# Patient Record
Sex: Female | Born: 1945
Health system: Southern US, Community
[De-identification: ages and names within clinical notes are randomized; demographics above are authoritative.]

## PROBLEM LIST (undated history)

## (undated) DIAGNOSIS — R0602 Shortness of breath: Secondary | ICD-10-CM

## (undated) DIAGNOSIS — J449 Chronic obstructive pulmonary disease, unspecified: Secondary | ICD-10-CM

## (undated) DIAGNOSIS — C801 Malignant (primary) neoplasm, unspecified: Secondary | ICD-10-CM

## (undated) DIAGNOSIS — J45909 Unspecified asthma, uncomplicated: Secondary | ICD-10-CM

## (undated) DIAGNOSIS — K567 Ileus, unspecified: Secondary | ICD-10-CM

## (undated) DIAGNOSIS — K9189 Other postprocedural complications and disorders of digestive system: Secondary | ICD-10-CM

## (undated) DIAGNOSIS — J90 Pleural effusion, not elsewhere classified: Secondary | ICD-10-CM

## (undated) DIAGNOSIS — C3490 Malignant neoplasm of unspecified part of unspecified bronchus or lung: Secondary | ICD-10-CM

## (undated) HISTORY — PX: LUNG BIOPSY: SHX232

## (undated) HISTORY — PX: APPENDECTOMY: SHX54

## (undated) HISTORY — PX: BREAST SURGERY: SHX581

## (undated) HISTORY — PX: ABDOMINAL HYSTERECTOMY: SHX81

## (undated) HISTORY — PX: CATARACT EXTRACTION W/ INTRAOCULAR LENS IMPLANT: SHX1309

## (undated) HISTORY — PX: COLON SURGERY: SHX602

## (undated) HISTORY — PX: EYE SURGERY: SHX253

## (undated) HISTORY — PX: DILATION AND CURETTAGE OF UTERUS: SHX78

---

## 2006-10-11 ENCOUNTER — Ambulatory Visit: Payer: Self-pay | Admitting: Cardiology

## 2009-12-30 ENCOUNTER — Ambulatory Visit (HOSPITAL_COMMUNITY): Admission: RE | Admit: 2009-12-30 | Discharge: 2009-12-30 | Payer: Self-pay | Admitting: Ophthalmology

## 2011-02-16 LAB — BASIC METABOLIC PANEL
BUN: 7 mg/dL (ref 6–23)
CO2: 31 mEq/L (ref 19–32)
Calcium: 9.2 mg/dL (ref 8.4–10.5)
Chloride: 104 mEq/L (ref 96–112)
Creatinine, Ser: 0.64 mg/dL (ref 0.4–1.2)
GFR calc Af Amer: >60 mL/min (ref >60)
GFR calc non Af Amer: >60 mL/min (ref >60)
Glucose, Bld: 93 mg/dL (ref 70–99)
Potassium: 4.1 mEq/L (ref 3.5–5.1)
Sodium: 140 mEq/L (ref 135–145)

## 2011-02-16 LAB — CBC
HCT: 46.6 % — ABNORMAL HIGH (ref 36.0–46.0)
Hemoglobin: 15.9 g/dL — ABNORMAL HIGH (ref 12.0–15.0)
MCV: 97.2 fL (ref 78.0–100.0)
Platelets: 165 10*3/uL (ref 150–400)
RBC: 4.8 MIL/uL (ref 3.87–5.11)
WBC: 6.1 10*3/uL (ref 4.0–10.5)

## 2012-10-10 DIAGNOSIS — Z961 Presence of intraocular lens: Secondary | ICD-10-CM | POA: Diagnosis not present

## 2012-10-10 DIAGNOSIS — H2510 Age-related nuclear cataract, unspecified eye: Secondary | ICD-10-CM | POA: Diagnosis not present

## 2012-10-10 DIAGNOSIS — H52229 Regular astigmatism, unspecified eye: Secondary | ICD-10-CM | POA: Diagnosis not present

## 2012-10-10 DIAGNOSIS — H524 Presbyopia: Secondary | ICD-10-CM | POA: Diagnosis not present

## 2012-10-17 DIAGNOSIS — S335XXA Sprain of ligaments of lumbar spine, initial encounter: Secondary | ICD-10-CM | POA: Diagnosis not present

## 2012-10-17 DIAGNOSIS — R300 Dysuria: Secondary | ICD-10-CM | POA: Diagnosis not present

## 2012-12-02 DIAGNOSIS — H547 Unspecified visual loss: Secondary | ICD-10-CM | POA: Diagnosis not present

## 2012-12-02 DIAGNOSIS — Z9849 Cataract extraction status, unspecified eye: Secondary | ICD-10-CM | POA: Diagnosis not present

## 2012-12-02 DIAGNOSIS — H2510 Age-related nuclear cataract, unspecified eye: Secondary | ICD-10-CM | POA: Diagnosis not present

## 2012-12-20 ENCOUNTER — Encounter (HOSPITAL_COMMUNITY): Payer: Self-pay | Admitting: Pharmacy Technician

## 2012-12-25 ENCOUNTER — Encounter (HOSPITAL_COMMUNITY): Payer: Self-pay

## 2012-12-25 ENCOUNTER — Other Ambulatory Visit: Payer: Self-pay

## 2012-12-25 ENCOUNTER — Encounter (HOSPITAL_COMMUNITY)
Admission: RE | Admit: 2012-12-25 | Discharge: 2012-12-25 | Disposition: A | Discharge status: Home / Self Care | Payer: Medicare Other | Source: Ambulatory Visit | Attending: Ophthalmology | Admitting: Ophthalmology

## 2012-12-25 DIAGNOSIS — Z01810 Encounter for preprocedural cardiovascular examination: Secondary | ICD-10-CM | POA: Diagnosis not present

## 2012-12-25 DIAGNOSIS — J449 Chronic obstructive pulmonary disease, unspecified: Secondary | ICD-10-CM | POA: Diagnosis not present

## 2012-12-25 DIAGNOSIS — Z01812 Encounter for preprocedural laboratory examination: Secondary | ICD-10-CM | POA: Diagnosis not present

## 2012-12-25 DIAGNOSIS — H2510 Age-related nuclear cataract, unspecified eye: Secondary | ICD-10-CM | POA: Diagnosis not present

## 2012-12-25 HISTORY — DX: Chronic obstructive pulmonary disease, unspecified: J44.9

## 2012-12-25 HISTORY — DX: Shortness of breath: R06.02

## 2012-12-25 HISTORY — DX: Unspecified asthma, uncomplicated: J45.909

## 2012-12-25 LAB — BASIC METABOLIC PANEL
CO2: 33 mEq/L — ABNORMAL HIGH (ref 19–32)
Chloride: 104 mEq/L (ref 96–112)
GFR calc non Af Amer: >90 mL/min (ref >90)
Glucose, Bld: 83 mg/dL (ref 70–99)
Potassium: 4.9 mEq/L (ref 3.5–5.1)
Sodium: 142 mEq/L (ref 135–145)

## 2012-12-25 NOTE — Patient Instructions (Addendum)
Your procedure is scheduled on:  01/02/13  Report to [facility] at 7:00     AM.  Call this number if you have problems the morning of surgery: [phone]   Remember:   Do not eat or drink :After Midnight.    Take these medicines the morning of surgery:  Use Albuterol and Qvar inhalers   Do not wear jewelry, make-up or nail polish.  Do not wear lotions, powders, or perfumes. You may wear deodorant.  Do not shave 48 hours prior to surgery.  Do not bring valuables to the hospital.  Contacts, dentures or bridgework may not be worn into surgery.  Patients discharged the day of surgery will not be allowed to drive home.  Name and phone number of your driver:    Please read over the following fact sheets that you were given: Pain Booklet, Surgical Site Infection Prevention, Anesthesia Post-op Instructions and Care and Recovery After Surgery  Cataract Surgery  A cataract is a clouding of the lens of the eye. When a lens becomes cloudy, vision is reduced based on the degree and nature of the clouding. Surgery may be needed to improve vision. Surgery removes the cloudy lens and usually replaces it with a substitute lens (intraocular lens, IOL). LET YOUR EYE DOCTOR KNOW ABOUT:  Allergies to food or medicine.   Medicines taken including herbs, eyedrops, over-the-counter medicines, and creams.   Use of steroids (by mouth or creams).   Previous problems with anesthetics or numbing medicine.   History of bleeding problems or blood clots.   Previous surgery.   Other health problems, including diabetes and kidney problems.   Possibility of pregnancy, if this applies.  RISKS AND COMPLICATIONS  Infection.   Inflammation of the eyeball (endophthalmitis) that can spread to both eyes (sympathetic ophthalmia).   Poor wound healing.   If an IOL is inserted, it can later fall out of proper position. This is very uncommon.   Clouding of the part of your eye that holds an IOL in place. This is  called an "after-cataract." These are uncommon, but easily treated.  BEFORE THE PROCEDURE  Do not eat or drink anything except small amounts of water for 8 to 12 before your surgery, or as directed by your caregiver.   Unless you are told otherwise, continue any eyedrops you have been prescribed.   Talk to your primary caregiver about all other medicines that you take (both prescription and non-prescription). In some cases, you may need to stop or change medicines near the time of your surgery. This is most important if you are taking blood-thinning medicine.Do not stop medicines unless you are told to do so.   Arrange for someone to drive you to and from the procedure.   Do not put contact lenses in either eye on the day of your surgery.  PROCEDURE There is more than one method for safely removing a cataract. Your doctor can explain the differences and help determine which is best for you. Phacoemulsification surgery is the most common form of cataract surgery.  An injection is given behind the eye or eyedrops are given to make this a painless procedure.   A small cut (incision) is made on the edge of the clear, dome-shaped surface that covers the front of the eye (cornea).   A tiny probe is painlessly inserted into the eye. This device gives off ultrasound waves that soften and break up the cloudy center of the lens. This makes it easier for  the cloudy lens to be removed by suction.   An IOL may be implanted.   The normal lens of the eye is covered by a clear capsule. Part of that capsule is intentionally left in the eye to support the IOL.   Your surgeon may or may not use stitches to close the incision.  There are other forms of cataract surgery that require a larger incision and stiches to close the eye. This approach is taken in cases where the doctor feels that the cataract cannot be easily removed using phacoemulsification. AFTER THE PROCEDURE  When an IOL is implanted, it  does not need care. It becomes a permanent part of your eye and cannot be seen or felt.   Your doctor will schedule follow-up exams to check on your progress.   Review your other medicines with your doctor to see which can be resumed after surgery.   Use eyedrops or take medicine as prescribed by your doctor.  Document Released: 11/02/2011 Document Reviewed: 10/30/2011 [facility] Patient Information 2012 [place].  .Cataract Surgery Care After Refer to this sheet in the next few weeks. These instructions provide you with information on caring for yourself after your procedure. Your caregiver may also give you more specific instructions. Your treatment has been planned according to current medical practices, but problems sometimes occur. Call your caregiver if you have any problems or questions after your procedure.  HOME CARE INSTRUCTIONS   Avoid strenuous activities as directed by your caregiver.   Ask your caregiver when you can resume driving.   Use eyedrops or other medicines to help healing and control pressure inside your eye as directed by your caregiver.   Only take over-the-counter or prescription medicines for pain, discomfort, or fever as directed by your caregiver.   Do not to touch or rub your eyes.   You may be instructed to use a protective shield during the first few days and nights after surgery. If not, wear sunglasses to protect your eyes. This is to protect the eye from pressure or from being accidentally bumped.   Keep the area around your eye clean and dry. Avoid swimming or allowing water to hit you directly in the face while showering. Keep soap and shampoo out of your eyes.   Do not bend or lift heavy objects. Bending increases pressure in the eye. You can walk, climb stairs, and do light household chores.   Do not put a contact lens into the eye that had surgery until your caregiver says it is okay to do so.   Ask your doctor when you can return to  work. This will depend on the kind of work that you do. If you work in a dusty environment, you may be advised to wear protective eyewear for a period of time.   Ask your caregiver when it will be safe to engage in sexual activity.   Continue with your regular eye exams as directed by your caregiver.  What to expect:  It is normal to feel itching and mild discomfort for a few days after cataract surgery. Some fluid discharge is also common, and your eye may be sensitive to light and touch.   After 1 to 2 days, even moderate discomfort should disappear. In most cases, healing will take about 6 weeks.   If you received an intraocular lens (IOL), you may notice that colors are very bright or have a blue tinge. Also, if you have been in bright sunlight, everything may appear  reddish for a few hours. If you see these color tinges, it is because your lens is clear and no longer cloudy. Within a few months after receiving an IOL, these extra colors should go away. When you have healed, you will probably need new glasses.  SEEK MEDICAL CARE IF:   You have increased bruising around your eye.   You have discomfort not helped by medicine.  SEEK IMMEDIATE MEDICAL CARE IF:   You have a fever.   You have a worsening or sudden vision loss.   You have redness, swelling, or increasing pain in the eye.   You have a thick discharge from the eye that had surgery.  MAKE SURE YOU:  Understand these instructions.   Will watch your condition.   Will get help right away if you are not doing well or get worse.  Document Released: 06/02/2005 Document Revised: 11/02/2011 Document Reviewed: 07/07/2011 [facility] Patient Information 2012 [place], [place].

## 2013-01-02 ENCOUNTER — Encounter (HOSPITAL_COMMUNITY)
Admission: RE | Disposition: A | Discharge status: Home / Self Care | Payer: Self-pay | Source: Ambulatory Visit | Attending: Ophthalmology

## 2013-01-02 ENCOUNTER — Ambulatory Visit (HOSPITAL_COMMUNITY): Payer: Medicare Other | Admitting: Anesthesiology

## 2013-01-02 ENCOUNTER — Encounter (HOSPITAL_COMMUNITY): Payer: Self-pay | Admitting: *Deleted

## 2013-01-02 ENCOUNTER — Ambulatory Visit (HOSPITAL_COMMUNITY)
Admission: RE | Admit: 2013-01-02 | Discharge: 2013-01-02 | Disposition: A | Discharge status: Home / Self Care | Payer: Medicare Other | Source: Ambulatory Visit | Attending: Ophthalmology | Admitting: Ophthalmology

## 2013-01-02 ENCOUNTER — Encounter (HOSPITAL_COMMUNITY): Payer: Self-pay | Admitting: Anesthesiology

## 2013-01-02 DIAGNOSIS — Z01810 Encounter for preprocedural cardiovascular examination: Secondary | ICD-10-CM | POA: Insufficient documentation

## 2013-01-02 DIAGNOSIS — J4489 Other specified chronic obstructive pulmonary disease: Secondary | ICD-10-CM | POA: Insufficient documentation

## 2013-01-02 DIAGNOSIS — J449 Chronic obstructive pulmonary disease, unspecified: Secondary | ICD-10-CM | POA: Insufficient documentation

## 2013-01-02 DIAGNOSIS — H2510 Age-related nuclear cataract, unspecified eye: Secondary | ICD-10-CM | POA: Diagnosis not present

## 2013-01-02 DIAGNOSIS — Z01812 Encounter for preprocedural laboratory examination: Secondary | ICD-10-CM | POA: Diagnosis not present

## 2013-01-02 DIAGNOSIS — H269 Unspecified cataract: Secondary | ICD-10-CM | POA: Diagnosis not present

## 2013-01-02 HISTORY — PX: CATARACT EXTRACTION W/PHACO: SHX586

## 2013-01-02 SURGERY — PHACOEMULSIFICATION, CATARACT, WITH IOL INSERTION
Anesthesia: Monitor Anesthesia Care | Site: Eye | Laterality: Left | Wound class: Clean

## 2013-01-02 MED ORDER — NEOMYCIN-POLYMYXIN-DEXAMETH 0.1 % OP OINT
TOPICAL_OINTMENT | OPHTHALMIC | Status: DC | PRN
  Administered 2013-01-02: 1 via OPHTHALMIC

## 2013-01-02 MED ORDER — MIDAZOLAM HCL 2 MG/2ML IJ SOLN
1.0000 mg | INTRAMUSCULAR | Status: DC | PRN
  Administered 2013-01-02: 2 mg via INTRAVENOUS

## 2013-01-02 MED ORDER — LIDOCAINE HCL 3.5 % OP GEL
OPHTHALMIC | Status: AC
  Filled 2013-01-02: qty 5

## 2013-01-02 MED ORDER — TETRACAINE HCL 0.5 % OP SOLN
OPHTHALMIC | Status: AC
Start: 2013-01-02 — End: 2013-01-02
  Filled 2013-01-02: qty 2

## 2013-01-02 MED ORDER — LIDOCAINE HCL (PF) 1 % IJ SOLN
INTRAMUSCULAR | Status: DC | PRN
  Administered 2013-01-02: .5 mL

## 2013-01-02 MED ORDER — EPINEPHRINE HCL 1 MG/ML IJ SOLN
INTRAMUSCULAR | Status: AC
  Filled 2013-01-02: qty 1

## 2013-01-02 MED ORDER — CYCLOPENTOLATE-PHENYLEPHRINE 0.2-1 % OP SOLN
OPHTHALMIC | Status: AC
  Filled 2013-01-02: qty 2

## 2013-01-02 MED ORDER — NEOMYCIN-POLYMYXIN-DEXAMETH 3.5-10000-0.1 OP OINT
TOPICAL_OINTMENT | OPHTHALMIC | Status: AC
  Filled 2013-01-02: qty 3.5

## 2013-01-02 MED ORDER — PROVISC 10 MG/ML IO SOLN
INTRAOCULAR | Status: DC | PRN
  Administered 2013-01-02: 8.5 mg via INTRAOCULAR

## 2013-01-02 MED ORDER — LACTATED RINGERS IV SOLN
INTRAVENOUS | Status: DC
  Administered 2013-01-02: 1000 mL via INTRAVENOUS

## 2013-01-02 MED ORDER — PHENYLEPHRINE HCL 2.5 % OP SOLN
OPHTHALMIC | Status: AC
  Filled 2013-01-02: qty 2

## 2013-01-02 MED ORDER — BSS IO SOLN
INTRAOCULAR | Status: DC | PRN
  Administered 2013-01-02: 15 mL via INTRAOCULAR

## 2013-01-02 MED ORDER — TETRACAINE HCL 0.5 % OP SOLN
1.0000 [drp] | OPHTHALMIC | Status: AC
  Administered 2013-01-02 (×3): 1 [drp] via OPHTHALMIC

## 2013-01-02 MED ORDER — EPINEPHRINE HCL 1 MG/ML IJ SOLN
INTRAMUSCULAR | Status: DC | PRN
  Administered 2013-01-02: 15:00:00

## 2013-01-02 MED ORDER — LIDOCAINE HCL (PF) 1 % IJ SOLN
INTRAMUSCULAR | Status: AC
  Filled 2013-01-02: qty 2

## 2013-01-02 MED ORDER — MIDAZOLAM HCL 2 MG/2ML IJ SOLN
INTRAMUSCULAR | Status: AC
  Filled 2013-01-02: qty 2

## 2013-01-02 MED ORDER — POVIDONE-IODINE 5 % OP SOLN
OPHTHALMIC | Status: DC | PRN
  Administered 2013-01-02: 1 via OPHTHALMIC

## 2013-01-02 MED ORDER — LIDOCAINE HCL 3.5 % OP GEL
1.0000 "application " | Freq: Once | OPHTHALMIC | Status: AC
  Administered 2013-01-02: 1 via OPHTHALMIC

## 2013-01-02 MED ORDER — CYCLOPENTOLATE-PHENYLEPHRINE 0.2-1 % OP SOLN
1.0000 [drp] | OPHTHALMIC | Status: AC
  Administered 2013-01-02 (×3): 1 [drp] via OPHTHALMIC

## 2013-01-02 MED ORDER — PHENYLEPHRINE HCL 2.5 % OP SOLN
1.0000 [drp] | OPHTHALMIC | Status: AC
  Administered 2013-01-02 (×3): 1 [drp] via OPHTHALMIC

## 2013-01-02 SURGICAL SUPPLY — 32 items
CAPSULAR TENSION RING-AMO (OPHTHALMIC RELATED) IMPLANT
CLOTH BEACON ORANGE TIMEOUT ST (SAFETY) ×1 IMPLANT
EYE SHIELD UNIVERSAL CLEAR (GAUZE/BANDAGES/DRESSINGS) ×1 IMPLANT
GLOVE BIO SURGEON STRL SZ 6.5 (GLOVE) IMPLANT
GLOVE BIOGEL PI IND STRL 6.5 (GLOVE) IMPLANT
GLOVE BIOGEL PI IND STRL 7.0 (GLOVE) IMPLANT
GLOVE BIOGEL PI IND STRL 7.5 (GLOVE) IMPLANT
GLOVE BIOGEL PI INDICATOR 6.5 (GLOVE) ×1
GLOVE BIOGEL PI INDICATOR 7.0 (GLOVE)
GLOVE BIOGEL PI INDICATOR 7.5 (GLOVE)
GLOVE ECLIPSE 6.5 STRL STRAW (GLOVE) IMPLANT
GLOVE ECLIPSE 7.0 STRL STRAW (GLOVE) IMPLANT
GLOVE ECLIPSE 7.5 STRL STRAW (GLOVE) IMPLANT
GLOVE EXAM NITRILE LRG STRL (GLOVE) ×1 IMPLANT
GLOVE EXAM NITRILE MD LF STRL (GLOVE) IMPLANT
GLOVE SKINSENSE NS SZ6.5 (GLOVE)
GLOVE SKINSENSE NS SZ7.0 (GLOVE)
GLOVE SKINSENSE STRL SZ6.5 (GLOVE) IMPLANT
GLOVE SKINSENSE STRL SZ7.0 (GLOVE) IMPLANT
KIT VITRECTOMY (OPHTHALMIC RELATED) IMPLANT
PAD ARMBOARD 7.5X6 YLW CONV (MISCELLANEOUS) ×1 IMPLANT
PROC W NO LENS (INTRAOCULAR LENS)
PROC W SPEC LENS (INTRAOCULAR LENS)
PROCESS W NO LENS (INTRAOCULAR LENS) IMPLANT
PROCESS W SPEC LENS (INTRAOCULAR LENS) IMPLANT
RING MALYGIN (MISCELLANEOUS) IMPLANT
SIGHTPATH CAT PROC W REG LENS (Ophthalmic Related) ×2 IMPLANT
SYR TB 1ML LL NO SAFETY (SYRINGE) ×1 IMPLANT
TAPE SURG TRANSPORE 1 IN (GAUZE/BANDAGES/DRESSINGS) IMPLANT
TAPE SURGICAL TRANSPORE 1 IN (GAUZE/BANDAGES/DRESSINGS) ×1
VISCOELASTIC ADDITIONAL (OPHTHALMIC RELATED) IMPLANT
WATER STERILE IRR 250ML POUR (IV SOLUTION) ×1 IMPLANT

## 2013-01-02 NOTE — Anesthesia Preprocedure Evaluation (Signed)
Anesthesia Evaluation  Patient identified by MRN, date of birth, ID band Patient awake    Reviewed: Allergy & Precautions, H&P , NPO status , Patient's Chart, lab work & pertinent test results  Airway Mallampati: II TM Distance: >3 FB     Dental  (+) Edentulous Upper and Edentulous Lower   Pulmonary shortness of breath, asthma , COPD COPD inhaler, Current Smoker,  breath sounds clear to auscultation        Cardiovascular negative cardio ROS  Rhythm:Regular Rate:Normal     Neuro/Psych    GI/Hepatic negative GI ROS,   Endo/Other    Renal/GU      Musculoskeletal   Abdominal   Peds  Hematology   Anesthesia Other Findings   Reproductive/Obstetrics                           Anesthesia Physical Anesthesia Plan  ASA: III  Anesthesia Plan: MAC   Post-op Pain Management:    Induction: Intravenous  Airway Management Planned: Nasal Cannula  Additional Equipment:   Intra-op Plan:   Post-operative Plan:   Informed Consent: I have reviewed the patients History and Physical, chart, labs and discussed the procedure including the risks, benefits and alternatives for the proposed anesthesia with the patient or authorized representative who has indicated his/her understanding and acceptance.     Plan Discussed with:   Anesthesia Plan Comments:         Anesthesia Quick Evaluation

## 2013-01-02 NOTE — Anesthesia Procedure Notes (Signed)
Procedure Name: MAC Date/Time: 01/02/2013 2:30 PM Performed by: Franco Nones Pre-anesthesia Checklist: Patient identified, Emergency Drugs available, Suction available, Timeout performed and Patient being monitored Patient Re-evaluated:Patient Re-evaluated prior to inductionOxygen Delivery Method: Nasal Cannula

## 2013-01-02 NOTE — Brief Op Note (Signed)
Pre-Op Dx: Cataract OS Post-Op Dx: Cataract OS Surgeon: Kaari Zeigler Anesthesia: Topical with MAC Surgery: Cataract Extraction with Intraocular lens Implant OS Implant: Lenstec, Model Softec HD Specimen: None Complications: None 

## 2013-01-02 NOTE — Transfer of Care (Signed)
Immediate Anesthesia Transfer of Care Note  Patient: Melissa Golden  Procedure(s) Performed: Procedure(s) (LRB): CATARACT EXTRACTION PHACO AND INTRAOCULAR LENS PLACEMENT (IOC) (Left)  Patient Location: Shortstay  Anesthesia Type: MAC  Level of Consciousness: awake  Airway & Oxygen Therapy: Patient Spontanous Breathing   Post-op Assessment: Report given to PACU RN, Post -op Vital signs reviewed and stable and Patient moving all extremities  Post vital signs: Reviewed and stable  Complications: No apparent anesthesia complications

## 2013-01-02 NOTE — Anesthesia Postprocedure Evaluation (Signed)
  Anesthesia Post-op Note  Patient: Melissa Golden  Procedure(s) Performed: Procedure(s) (LRB): CATARACT EXTRACTION PHACO AND INTRAOCULAR LENS PLACEMENT (IOC) (Left)  Patient Location:  Short Stay  Anesthesia Type: MAC  Level of Consciousness: awake  Airway and Oxygen Therapy: Patient Spontanous Breathing  Post-op Pain: none  Post-op Assessment: Post-op Vital signs reviewed, Patient's Cardiovascular Status Stable, Respiratory Function Stable, Patent Airway, No signs of Nausea or vomiting and Pain level controlled  Post-op Vital Signs: Reviewed and stable  Complications: No apparent anesthesia complications

## 2013-01-02 NOTE — H&P (Signed)
I have reviewed the H&P, the patient was re-examined, and I have identified no interval changes in medical condition and plan of care since the history and physical of record  

## 2013-01-03 ENCOUNTER — Encounter (HOSPITAL_COMMUNITY): Payer: Self-pay | Admitting: Ophthalmology

## 2013-01-03 NOTE — Op Note (Signed)
Melissa Golden, KETNER NO.:  [id number]  MEDICAL RECORD NO.:  [id number]  LOCATION:  [facility]                          FACILITY:  [facility]  PHYSICIAN:  Susanne Greenhouse, MD       DATE OF BIRTH:  December 25, 1945  DATE OF PROCEDURE:  01/02/2013 DATE OF DISCHARGE:  01/02/2013                              OPERATIVE REPORT   PREOPERATIVE DIAGNOSIS:  Nuclear cataract, left eye.  POSTOPERATIVE DIAGNOSIS:  Nuclear cataract, left eye.  SURGERY:  Phacoemulsification with posterior chamber intraocular lens implantation, left eye.  ANESTHESIA:  Topical with monitored anesthesia care and IV sedation.  SURGEON:  Susanne Greenhouse, MD  OPERATIVE SUMMARY:  In the preoperative area, dilating drops were placed into the left eye.  The patient was then brought into the operating room where she was placed under topical anesthesia and IV sedation.  The eye was then prepped and draped.  Beginning with a 75 blade, a paracentesis port was made at the surgeon's 2 o'clock position.  The anterior chamber was then filled with a 1% nonpreserved lidocaine solution with epinephrine.  This was followed by Viscoat to deepen the chamber.  A small fornix-based peritomy was performed superiorly.  Next, a single iris hook was placed through the limbus superiorly.  A 2.4-mm keratome blade was then used to make a clear corneal incision over the iris hook. A bent cystotome needle and Utrata forceps were used to create a continuous tear capsulotomy.  Hydrodissection was performed using balanced salt solution on a fine cannula.  The lens nucleus was then removed using phacoemulsification in a quadrant cracking technique.  The cortical material was then removed with irrigation and aspiration.  The capsular bag and anterior chamber were refilled with Provisc.  The wound was widened to approximately 3 mm and a posterior chamber intraocular lens was placed into the capsular bag without difficulty using an [employer] lens  injecting system.  A single 10-0 nylon suture was then used to close the incision as well as stromal hydration.  The Provisc was removed from the anterior chamber and capsular bag with irrigation and aspiration.  At this point, the wounds were tested for leak, which were negative.  The anterior chamber remained deep and stable.  The patient tolerated the procedure well.  There were no operative complications, and she awoke from topical anesthesia and IV sedation without problem.  No surgical specimens.  Prosthetic device used is a Lenstech posterior chamber lens, model Softech HD, power of 23.5, serial number is [id number].          ______________________________ Susanne Greenhouse, MD     KEH/MEDQ  D:  01/02/2013  T:  01/03/2013  Job:  [id number]

## 2013-03-18 DIAGNOSIS — J438 Other emphysema: Secondary | ICD-10-CM | POA: Diagnosis not present

## 2013-09-25 DIAGNOSIS — Z23 Encounter for immunization: Secondary | ICD-10-CM | POA: Diagnosis not present

## 2013-09-25 DIAGNOSIS — R071 Chest pain on breathing: Secondary | ICD-10-CM | POA: Diagnosis not present

## 2013-10-09 DIAGNOSIS — N63 Unspecified lump in unspecified breast: Secondary | ICD-10-CM | POA: Diagnosis not present

## 2013-10-09 DIAGNOSIS — N644 Mastodynia: Secondary | ICD-10-CM | POA: Diagnosis not present

## 2013-10-14 DIAGNOSIS — R928 Other abnormal and inconclusive findings on diagnostic imaging of breast: Secondary | ICD-10-CM | POA: Diagnosis not present

## 2013-10-14 DIAGNOSIS — N644 Mastodynia: Secondary | ICD-10-CM | POA: Diagnosis not present

## 2013-10-14 DIAGNOSIS — N63 Unspecified lump in unspecified breast: Secondary | ICD-10-CM | POA: Diagnosis not present

## 2013-12-30 DIAGNOSIS — R509 Fever, unspecified: Secondary | ICD-10-CM | POA: Diagnosis not present

## 2013-12-30 DIAGNOSIS — J449 Chronic obstructive pulmonary disease, unspecified: Secondary | ICD-10-CM | POA: Diagnosis not present

## 2014-01-27 DIAGNOSIS — H26499 Other secondary cataract, unspecified eye: Secondary | ICD-10-CM | POA: Diagnosis not present

## 2014-01-27 DIAGNOSIS — H524 Presbyopia: Secondary | ICD-10-CM | POA: Diagnosis not present

## 2014-01-27 DIAGNOSIS — H5200 Hypermetropia, unspecified eye: Secondary | ICD-10-CM | POA: Diagnosis not present

## 2014-01-27 DIAGNOSIS — H43399 Other vitreous opacities, unspecified eye: Secondary | ICD-10-CM | POA: Diagnosis not present

## 2014-03-04 DIAGNOSIS — R05 Cough: Secondary | ICD-10-CM | POA: Diagnosis not present

## 2014-03-04 DIAGNOSIS — J449 Chronic obstructive pulmonary disease, unspecified: Secondary | ICD-10-CM | POA: Diagnosis not present

## 2014-03-04 DIAGNOSIS — R059 Cough, unspecified: Secondary | ICD-10-CM | POA: Diagnosis not present

## 2014-03-30 DIAGNOSIS — Z961 Presence of intraocular lens: Secondary | ICD-10-CM | POA: Diagnosis not present

## 2014-03-30 DIAGNOSIS — H26499 Other secondary cataract, unspecified eye: Secondary | ICD-10-CM | POA: Diagnosis not present

## 2014-03-30 DIAGNOSIS — H35379 Puckering of macula, unspecified eye: Secondary | ICD-10-CM | POA: Diagnosis not present

## 2014-08-25 DIAGNOSIS — J449 Chronic obstructive pulmonary disease, unspecified: Secondary | ICD-10-CM | POA: Diagnosis not present

## 2014-08-25 DIAGNOSIS — N959 Unspecified menopausal and perimenopausal disorder: Secondary | ICD-10-CM | POA: Diagnosis not present

## 2014-08-25 DIAGNOSIS — Z23 Encounter for immunization: Secondary | ICD-10-CM | POA: Diagnosis not present

## 2014-09-15 DIAGNOSIS — N959 Unspecified menopausal and perimenopausal disorder: Secondary | ICD-10-CM | POA: Diagnosis not present

## 2014-09-15 DIAGNOSIS — J440 Chronic obstructive pulmonary disease with acute lower respiratory infection: Secondary | ICD-10-CM | POA: Diagnosis not present

## 2014-12-03 DIAGNOSIS — F17210 Nicotine dependence, cigarettes, uncomplicated: Secondary | ICD-10-CM | POA: Diagnosis not present

## 2014-12-03 DIAGNOSIS — J439 Emphysema, unspecified: Secondary | ICD-10-CM | POA: Diagnosis not present

## 2014-12-03 DIAGNOSIS — S20211A Contusion of right front wall of thorax, initial encounter: Secondary | ICD-10-CM | POA: Diagnosis not present

## 2014-12-09 DIAGNOSIS — J440 Chronic obstructive pulmonary disease with acute lower respiratory infection: Secondary | ICD-10-CM | POA: Diagnosis not present

## 2014-12-09 DIAGNOSIS — S20211A Contusion of right front wall of thorax, initial encounter: Secondary | ICD-10-CM | POA: Diagnosis not present

## 2014-12-09 DIAGNOSIS — Z1389 Encounter for screening for other disorder: Secondary | ICD-10-CM | POA: Diagnosis not present

## 2014-12-14 DIAGNOSIS — I2510 Atherosclerotic heart disease of native coronary artery without angina pectoris: Secondary | ICD-10-CM | POA: Diagnosis not present

## 2014-12-14 DIAGNOSIS — J432 Centrilobular emphysema: Secondary | ICD-10-CM | POA: Diagnosis not present

## 2014-12-14 DIAGNOSIS — R0789 Other chest pain: Secondary | ICD-10-CM | POA: Diagnosis not present

## 2015-01-20 DIAGNOSIS — N959 Unspecified menopausal and perimenopausal disorder: Secondary | ICD-10-CM | POA: Diagnosis not present

## 2015-01-20 DIAGNOSIS — J440 Chronic obstructive pulmonary disease with acute lower respiratory infection: Secondary | ICD-10-CM | POA: Diagnosis not present

## 2015-02-06 DIAGNOSIS — M25552 Pain in left hip: Secondary | ICD-10-CM | POA: Diagnosis not present

## 2015-02-06 DIAGNOSIS — X58XXXA Exposure to other specified factors, initial encounter: Secondary | ICD-10-CM | POA: Diagnosis not present

## 2015-02-06 DIAGNOSIS — S73102A Unspecified sprain of left hip, initial encounter: Secondary | ICD-10-CM | POA: Diagnosis not present

## 2015-02-06 DIAGNOSIS — S79912A Unspecified injury of left hip, initial encounter: Secondary | ICD-10-CM | POA: Diagnosis not present

## 2015-02-07 DIAGNOSIS — S79912A Unspecified injury of left hip, initial encounter: Secondary | ICD-10-CM | POA: Diagnosis not present

## 2015-02-07 DIAGNOSIS — M25552 Pain in left hip: Secondary | ICD-10-CM | POA: Diagnosis not present

## 2015-05-06 DIAGNOSIS — J439 Emphysema, unspecified: Secondary | ICD-10-CM | POA: Diagnosis not present

## 2015-05-06 DIAGNOSIS — F17210 Nicotine dependence, cigarettes, uncomplicated: Secondary | ICD-10-CM | POA: Diagnosis not present

## 2015-05-06 DIAGNOSIS — N959 Unspecified menopausal and perimenopausal disorder: Secondary | ICD-10-CM | POA: Diagnosis not present

## 2015-05-06 DIAGNOSIS — E780 Pure hypercholesterolemia: Secondary | ICD-10-CM | POA: Diagnosis not present

## 2015-05-12 DIAGNOSIS — F17210 Nicotine dependence, cigarettes, uncomplicated: Secondary | ICD-10-CM | POA: Diagnosis not present

## 2015-05-12 DIAGNOSIS — J440 Chronic obstructive pulmonary disease with acute lower respiratory infection: Secondary | ICD-10-CM | POA: Diagnosis not present

## 2015-05-12 DIAGNOSIS — N959 Unspecified menopausal and perimenopausal disorder: Secondary | ICD-10-CM | POA: Diagnosis not present

## 2015-05-12 DIAGNOSIS — J439 Emphysema, unspecified: Secondary | ICD-10-CM | POA: Diagnosis not present

## 2015-05-12 DIAGNOSIS — F419 Anxiety disorder, unspecified: Secondary | ICD-10-CM | POA: Diagnosis not present

## 2015-06-10 DIAGNOSIS — Z1231 Encounter for screening mammogram for malignant neoplasm of breast: Secondary | ICD-10-CM | POA: Diagnosis not present

## 2015-06-28 DIAGNOSIS — H524 Presbyopia: Secondary | ICD-10-CM | POA: Diagnosis not present

## 2015-06-28 DIAGNOSIS — D3130 Benign neoplasm of unspecified choroid: Secondary | ICD-10-CM | POA: Diagnosis not present

## 2015-06-28 DIAGNOSIS — H35373 Puckering of macula, bilateral: Secondary | ICD-10-CM | POA: Diagnosis not present

## 2015-06-28 DIAGNOSIS — Z961 Presence of intraocular lens: Secondary | ICD-10-CM | POA: Diagnosis not present

## 2015-09-13 DIAGNOSIS — Z23 Encounter for immunization: Secondary | ICD-10-CM | POA: Diagnosis not present

## 2015-09-13 DIAGNOSIS — N959 Unspecified menopausal and perimenopausal disorder: Secondary | ICD-10-CM | POA: Diagnosis not present

## 2015-09-13 DIAGNOSIS — J440 Chronic obstructive pulmonary disease with acute lower respiratory infection: Secondary | ICD-10-CM | POA: Diagnosis not present

## 2015-09-13 DIAGNOSIS — F17210 Nicotine dependence, cigarettes, uncomplicated: Secondary | ICD-10-CM | POA: Diagnosis not present

## 2015-09-13 DIAGNOSIS — F419 Anxiety disorder, unspecified: Secondary | ICD-10-CM | POA: Diagnosis not present

## 2015-11-28 DIAGNOSIS — C801 Malignant (primary) neoplasm, unspecified: Secondary | ICD-10-CM

## 2015-11-28 HISTORY — DX: Malignant (primary) neoplasm, unspecified: C80.1

## 2016-02-07 DIAGNOSIS — Z1389 Encounter for screening for other disorder: Secondary | ICD-10-CM | POA: Diagnosis not present

## 2016-02-07 DIAGNOSIS — N959 Unspecified menopausal and perimenopausal disorder: Secondary | ICD-10-CM | POA: Diagnosis not present

## 2016-02-07 DIAGNOSIS — F419 Anxiety disorder, unspecified: Secondary | ICD-10-CM | POA: Diagnosis not present

## 2016-02-07 DIAGNOSIS — J440 Chronic obstructive pulmonary disease with acute lower respiratory infection: Secondary | ICD-10-CM | POA: Diagnosis not present

## 2016-02-07 DIAGNOSIS — F17210 Nicotine dependence, cigarettes, uncomplicated: Secondary | ICD-10-CM | POA: Diagnosis not present

## 2016-06-12 DIAGNOSIS — Z681 Body mass index (BMI) 19 or less, adult: Secondary | ICD-10-CM | POA: Diagnosis not present

## 2016-06-12 DIAGNOSIS — S80861A Insect bite (nonvenomous), right lower leg, initial encounter: Secondary | ICD-10-CM | POA: Diagnosis not present

## 2016-06-12 DIAGNOSIS — F419 Anxiety disorder, unspecified: Secondary | ICD-10-CM | POA: Diagnosis not present

## 2016-06-12 DIAGNOSIS — J440 Chronic obstructive pulmonary disease with acute lower respiratory infection: Secondary | ICD-10-CM | POA: Diagnosis not present

## 2016-06-12 DIAGNOSIS — N959 Unspecified menopausal and perimenopausal disorder: Secondary | ICD-10-CM | POA: Diagnosis not present

## 2016-06-12 DIAGNOSIS — F17210 Nicotine dependence, cigarettes, uncomplicated: Secondary | ICD-10-CM | POA: Diagnosis not present

## 2016-06-13 DIAGNOSIS — R928 Other abnormal and inconclusive findings on diagnostic imaging of breast: Secondary | ICD-10-CM | POA: Diagnosis not present

## 2016-06-13 DIAGNOSIS — Z1231 Encounter for screening mammogram for malignant neoplasm of breast: Secondary | ICD-10-CM | POA: Diagnosis not present

## 2016-07-12 DIAGNOSIS — R928 Other abnormal and inconclusive findings on diagnostic imaging of breast: Secondary | ICD-10-CM | POA: Diagnosis not present

## 2016-07-12 DIAGNOSIS — N6489 Other specified disorders of breast: Secondary | ICD-10-CM | POA: Diagnosis not present

## 2016-10-26 DIAGNOSIS — R05 Cough: Secondary | ICD-10-CM | POA: Diagnosis not present

## 2016-10-26 DIAGNOSIS — F17200 Nicotine dependence, unspecified, uncomplicated: Secondary | ICD-10-CM | POA: Diagnosis not present

## 2016-10-26 DIAGNOSIS — J9601 Acute respiratory failure with hypoxia: Secondary | ICD-10-CM | POA: Diagnosis not present

## 2016-10-26 DIAGNOSIS — Z79899 Other long term (current) drug therapy: Secondary | ICD-10-CM | POA: Diagnosis not present

## 2016-10-26 DIAGNOSIS — R0602 Shortness of breath: Secondary | ICD-10-CM | POA: Diagnosis not present

## 2016-10-26 DIAGNOSIS — Z23 Encounter for immunization: Secondary | ICD-10-CM | POA: Diagnosis not present

## 2016-10-26 DIAGNOSIS — Z833 Family history of diabetes mellitus: Secondary | ICD-10-CM | POA: Diagnosis not present

## 2016-10-26 DIAGNOSIS — J441 Chronic obstructive pulmonary disease with (acute) exacerbation: Secondary | ICD-10-CM | POA: Diagnosis not present

## 2016-10-26 DIAGNOSIS — Z681 Body mass index (BMI) 19 or less, adult: Secondary | ICD-10-CM | POA: Diagnosis not present

## 2016-10-26 DIAGNOSIS — Z8249 Family history of ischemic heart disease and other diseases of the circulatory system: Secondary | ICD-10-CM | POA: Diagnosis not present

## 2016-10-26 DIAGNOSIS — Z809 Family history of malignant neoplasm, unspecified: Secondary | ICD-10-CM | POA: Diagnosis not present

## 2016-11-02 ENCOUNTER — Other Ambulatory Visit (HOSPITAL_COMMUNITY): Payer: Self-pay | Admitting: Family Medicine

## 2016-11-02 DIAGNOSIS — R911 Solitary pulmonary nodule: Secondary | ICD-10-CM

## 2016-11-15 ENCOUNTER — Encounter (HOSPITAL_COMMUNITY)
Admission: RE | Admit: 2016-11-15 | Discharge: 2016-11-15 | Disposition: A | Discharge status: Home / Self Care | Payer: Medicare Other | Source: Ambulatory Visit | Attending: Family Medicine | Admitting: Family Medicine

## 2016-11-15 DIAGNOSIS — R911 Solitary pulmonary nodule: Secondary | ICD-10-CM | POA: Diagnosis not present

## 2016-11-15 LAB — GLUCOSE, CAPILLARY: Glucose-Capillary: 80 mg/dL (ref 65–99)

## 2016-11-15 MED ORDER — FLUDEOXYGLUCOSE F - 18 (FDG) INJECTION: 5.0000 | Freq: Once | INTRAVENOUS | Status: DC | PRN

## 2016-11-21 ENCOUNTER — Encounter: Payer: PRIVATE HEALTH INSURANCE | Admitting: Surgery

## 2016-11-23 ENCOUNTER — Institutional Professional Consult (permissible substitution) (INDEPENDENT_AMBULATORY_CARE_PROVIDER_SITE_OTHER): Payer: Medicare Other | Admitting: Surgery

## 2016-11-23 ENCOUNTER — Encounter: Payer: Self-pay | Admitting: Surgery

## 2016-11-23 VITALS — BP 124/67 | HR 61 | Resp 20 | Ht 62.5 in | Wt 100.0 lb

## 2016-11-23 DIAGNOSIS — R918 Other nonspecific abnormal finding of lung field: Secondary | ICD-10-CM | POA: Diagnosis not present

## 2016-11-23 DIAGNOSIS — R911 Solitary pulmonary nodule: Secondary | ICD-10-CM | POA: Diagnosis not present

## 2016-11-24 ENCOUNTER — Encounter: Payer: Self-pay | Admitting: Surgery

## 2016-11-24 NOTE — Progress Notes (Signed)
Cardiothoracic Surgery Consultation  PCP is Rory Percy, MD Referring Provider is Tawni Carnes, [place]  Chief Complaint  Patient presents with  . Lung Lesion    Surgical eval, PET Scan 11/15/16,CTA Chest 10/26/16    HPI:  The patient is a 70 year old woman with a 100 pk-year smoking history who was admitted to [facility] at the end of November with acute respiratory failure with hypoxemia. She had a CXR that ruled out pneumonia and she was felt to have an exacerbation of COPD. She was treated with bipap, nebs and oxygen with improvement and she was sent home on oxygen. She had a CTA of the chest on admission which showed no evidence of PE but did show a 0.9 x 0.7 cm spiculated density in the RUL which had increased in size compared to a prior CT on 12/14/2014. There was a small stellate density in the subpleural RUL that was unchanged. There was mild ground glass opacity in the lingula and mild consolidation in the posterior LLL that were felt to be consistent with atelectasis or infiltrate. There was a focus of ground-glass density in the lateral subpleural LUL that was felt to be consistent with infection or inflammation. There was a 1.1 cm hyperenhancing lesion in the posterior right hepatic lobe. She underwent a PET scan on 12/20 which showed mild hypermetabolism in the 0.8 cm RUL apical lesion with an SUV max of 2.7. There were at least 7 other tiny pulmonary nodules measuring up to 0.5 mm scattered throughout both lungs, all below PET resolution. There was a solitary hypermetabolic non-enlarged right inguinal lymph node that was felt to be reactive.  She says that she has not smoked since her discharge at the end of November. She has been using her oxygen at home around the clock but did not bring it into the office today because it is heavy. She feels better with it on. She gets short of breath with minimal activity. She denies any chest pain or pressure. She has been eating well  and gaining some weight since not smoking.  Past Medical History:  Diagnosis Date  . Asthma   . COPD (chronic obstructive pulmonary disease) ([place])   . Shortness of breath    with exertion    Past Surgical History:  Procedure Laterality Date  . ABDOMINAL HYSTERECTOMY    . CATARACT EXTRACTION W/ INTRAOCULAR LENS IMPLANT     Right  . CATARACT EXTRACTION W/PHACO  01/02/2013   Procedure: CATARACT EXTRACTION PHACO AND INTRAOCULAR LENS PLACEMENT (IOC);  Surgeon: Tonny Branch, MD;  Location: [facility];  Service: Ophthalmology;  Laterality: Left;  CDE: 16.19    Family History  Problem Relation Age of Onset  . Cancer Mother   . Heart attack Father   . Cancer - Colon Sister     Social History Social History  Substance Use Topics  . Smoking status: Current Every Day Smoker    Packs/day: 0.25    Years: 1.00    Types: Cigarettes  . Smokeless tobacco: Not on file  . Alcohol use 3.6 oz/week    6 Cans of beer per week    Current Outpatient Prescriptions  Medication Sig Dispense Refill  . albuterol (PROVENTIL HFA;VENTOLIN HFA) 108 (90 BASE) MCG/ACT inhaler Inhale 2 puffs into the lungs every 6 (six) hours as needed. Shortness of Breath    . beclomethasone (QVAR) 40 MCG/ACT inhaler Inhale 2 puffs into the lungs 2 (two) times daily.    Marland Kitchen ibuprofen (  ADVIL,MOTRIN) 200 MG tablet Take 400 mg by mouth every 6 (six) hours as needed. Pain    . Multiple Vitamin (MULTIVITAMIN WITH MINERALS) TABS Take 1 tablet by mouth daily.     No current facility-administered medications for this visit.     No Known Allergies  Review of Systems  Constitutional: Negative for activity change, chills, fatigue and fever.       Improved appetite since not smoking and gaining some weight. Was down to 87 lbs when she was in the hospital.  HENT:       Dentures  Eyes: Negative.   Respiratory: Positive for shortness of breath. Negative for cough.   Cardiovascular: Negative for chest pain, palpitations and leg swelling.    Gastrointestinal: Negative.   Endocrine: Negative.   Genitourinary: Negative.   Musculoskeletal: Negative.   Skin: Negative.   Allergic/Immunologic: Negative.   Neurological: Negative.   Hematological: Bruises/bleeds easily.  Psychiatric/Behavioral: Negative.     BP 124/67   Pulse 61   Resp 20   Ht 5' 2.5" (1.588 m)   Wt 100 lb (45.4 kg)   SpO2 95% Comment: RA  BMI 18.00 kg/m  Physical Exam  Constitutional: She is oriented to person, place, and time.  Thin, chronically-ill appearing but in no distress  HENT:  Head: Normocephalic and atraumatic.  Mouth/Throat: Oropharynx is clear and moist.  Eyes: EOM are normal. Pupils are equal, round, and reactive to light. No scleral icterus.  Neck: Normal range of motion. Neck supple. No JVD present. No thyromegaly present.  Cardiovascular: Normal rate, regular rhythm, normal heart sounds and intact distal pulses.   No murmur heard. Pulmonary/Chest: Effort normal. No respiratory distress. She has no wheezes.  Distant breath sounds  Abdominal: Soft. Bowel sounds are normal. She exhibits no distension and no mass. There is no tenderness.  Musculoskeletal: Normal range of motion. She exhibits no edema.  Lymphadenopathy:    She has no cervical adenopathy.  Neurological: She is alert and oriented to person, place, and time. She has normal strength. No cranial nerve deficit.  Skin: Skin is warm and dry.  Psychiatric: She has a normal mood and affect.     Diagnostic Tests:  NM PET Image Initial (PI) Skull Base To Thigh (Accession [id number]) (Order [id number])  Imaging  Date: 11/15/2016 Department: [facility] Released By: Park Pope Priddy Authorizing: Caryl Bis, MD  Exam Information   Status Exam Begun  Exam Ended   Final [99] 11/15/2016 11:00 AM 11/15/2016 11:50 AM  PACS Images   Show images for NM PET Image Initial (PI) Skull Base To Thigh  Study Result   CLINICAL DATA:  Initial treatment  strategy for reported right upper lung lesion.  EXAM: NUCLEAR MEDICINE PET SKULL BASE TO THIGH  TECHNIQUE: 5.0 mCi F-18 FDG was injected intravenously. Full-ring PET imaging was performed from the skull base to thigh after the radiotracer. CT data was obtained and used for attenuation correction and anatomic localization.  FASTING BLOOD GLUCOSE:  Value: 80 mg/dl  COMPARISON:  None.  FINDINGS: NECK  No hypermetabolic lymph nodes in the neck. Relatively symmetric linear metabolism in the anterior in posterior paraspinal muscles of the neck, most consistent with activity related uptake.  CHEST  Left anterior descending and right coronary atherosclerosis. Atherosclerotic nonaneurysmal thoracic aorta. No pleural effusions. No hypermetabolic axillary, mediastinal or hilar lymph nodes. Moderate centrilobular emphysema with mild diffuse bronchial wall thickening.  Irregular subsolid 0.8 x 0.6 cm apical right upper lobe pulmonary  nodule (series 2/image 11) with low level metabolism (max SUV 2.7), which is considered significant for a nodule of this small size.  There at least 7 additional tiny pulmonary nodules scattered throughout both lungs, the largest a 0.5 cm subsolid medial right lower lobe pulmonary nodule (series 7/image 43), all below PET resolution and not associated with significant metabolism. No acute consolidative airspace disease or lung masses.  ABDOMEN/PELVIS  No abnormal hypermetabolic activity within the liver, pancreas, adrenal glands, or spleen.  Mildly hypermetabolic 0.7 cm right inguinal node with max SUV 4.0 (series 4/ image 159). No additional hypermetabolic lymph nodes in the abdomen or pelvis.  Aneurysmal dilatation of the suprarenal abdominal aorta, maximum diameter 3.3 cm. Atherosclerotic abdominal aorta. Hysterectomy.  SKELETON  No focal hypermetabolic activity to suggest skeletal metastasis.  IMPRESSION: 1. Irregular 0.8  cm apical right upper lobe pulmonary nodule with low level metabolism (max SUV 2.7), which is considered significant for a nodule of this small size. Primary bronchogenic carcinoma cannot be excluded. Thoracic surgical consultation advised. 2. At least 7 additional tiny pulmonary nodules measuring up to 0.5 cm, below PET resolution. Recommend comparison with any prior outside chest CT studies. If the dominant apical right upper lobe pulmonary nodule proves to represent a primary bronchogenic carcinoma, a follow-up chest CT is advised in 3 months. Otherwise, a follow-up chest CT could be obtained in 12 months. 3. No definite hypermetabolic metastatic disease. 4. Solitary hypermetabolic nonenlarged right inguinal lymph node, nonspecific. A reactive etiology is more likely than metastatic disease. Ultrasound-guided fine-needle aspiration could be obtained of this lymph node if clinically warranted. 5. Aortic atherosclerosis.  Two-vessel coronary atherosclerosis. 6. Suprarenal 3.3 cm abdominal aortic aneurysm. Recommend followup by ultrasound in 3 years. This recommendation follows ACR consensus guidelines: White Paper of the ACR Incidental Findings Committee II on Vascular Findings. J Am Coll Radiol 2013; 10:789-794. 7. Moderate centrilobular emphysema with mild diffuse bronchial wall thickening, suggesting COPD.   Electronically Signed   By: Ilona Sorrel M.D.   On: 11/15/2016 15:03     Impression:  I have personally reviewed her CT scan at [facility] on disc and the PET scan images in PACS. She has a 0.8-0.9 cm spiculated lesion in the apical portion of the RUL that has low level hypermetabolic activity on PET scan and is suspicious for a bronchogenic carcinoma in the long time heavy smoker. It has increased in size compared to CT in 2016. She has multiple other small lesions throughout both lungs but the other lesions are less than 0.5 cm and nonspecific at this time. I think  this lesion is too small to biopsy. It would probably require a right upper lobectomy to remove it with negative margins given its location and may not even be palpable. Given her long heavy smoking history and thin, chronically-ill appearance with recent admission for hypoxemic respiratory failure, SOB with minimal activity, I doubt that she would be a candidate for surgical resection. I think it is worth doing formal PFT's with diffusion capacity to assess this further since she has been off cigarettes for 6 weeks. If she is not a candidate for surgery then the only option is to follow this for a while and wait until it gets larger so that a biopsy could be done to document cancer and consider XRT at that time. I reviewed the CT and PET scan with her and her husband and answered their questions.    Plan:  PFT's with diffusion capacity and then I  will see her back to discuss the results, answer any further questions and plan further workup and treatment.  I spent 60 minutes performing this consultation and > 50% of this time was spent face to face counseling and coordinating the care of this patient's right upper lobe lung mass.  Gaye Pollack, MD [facility] [phone]

## 2016-11-27 DIAGNOSIS — J449 Chronic obstructive pulmonary disease, unspecified: Secondary | ICD-10-CM | POA: Diagnosis not present

## 2016-11-27 DIAGNOSIS — R938 Abnormal findings on diagnostic imaging of other specified body structures: Secondary | ICD-10-CM | POA: Diagnosis not present

## 2016-11-27 DIAGNOSIS — Z681 Body mass index (BMI) 19 or less, adult: Secondary | ICD-10-CM | POA: Diagnosis not present

## 2016-11-27 DIAGNOSIS — F17210 Nicotine dependence, cigarettes, uncomplicated: Secondary | ICD-10-CM | POA: Diagnosis not present

## 2016-11-27 DIAGNOSIS — F419 Anxiety disorder, unspecified: Secondary | ICD-10-CM | POA: Diagnosis not present

## 2016-11-27 DIAGNOSIS — J9601 Acute respiratory failure with hypoxia: Secondary | ICD-10-CM | POA: Diagnosis not present

## 2016-11-28 ENCOUNTER — Other Ambulatory Visit: Payer: Self-pay | Admitting: *Deleted

## 2016-11-28 DIAGNOSIS — R911 Solitary pulmonary nodule: Secondary | ICD-10-CM

## 2016-12-05 ENCOUNTER — Ambulatory Visit (HOSPITAL_COMMUNITY)
Admission: RE | Admit: 2016-12-05 | Discharge: 2016-12-05 | Disposition: A | Discharge status: Home / Self Care | Payer: Medicare Other | Source: Ambulatory Visit | Attending: Surgery | Admitting: Surgery

## 2016-12-05 DIAGNOSIS — R911 Solitary pulmonary nodule: Secondary | ICD-10-CM | POA: Diagnosis not present

## 2016-12-05 LAB — PULMONARY FUNCTION TEST
DL/VA % pred: 51 %
DL/VA: 2.38 ml/min/mmHg/L
DLCO unc % pred: 37 %
DLCO unc: 8.3 ml/min/mmHg
FEF 25-75 Post: 0.5 L/sec
FEF 25-75 Pre: 0.27 L/sec
FEF2575-%Change-Post: 82 %
FEF2575-%PRED-POST: 26 %
FEF2575-%Pred-Pre: 14 %
FEV1-%Change-Post: 35 %
FEV1-%PRED-PRE: 27 %
FEV1-%Pred-Post: 37 %
FEV1-POST: 0.81 L
FEV1-PRE: 0.6 L
FEV1FVC-%CHANGE-POST: 3 %
FEV1FVC-%Pred-Pre: 59 %
FEV6-%CHANGE-POST: 25 %
FEV6-%PRED-PRE: 47 %
FEV6-%Pred-Post: 60 %
FEV6-Post: 1.63 L
FEV6-Pre: 1.29 L
FEV6FVC-%Change-Post: -4 %
FEV6FVC-%PRED-POST: 98 %
FEV6FVC-%PRED-PRE: 103 %
FVC-%Change-Post: 31 %
FVC-%PRED-POST: 61 %
FVC-%Pred-Pre: 46 %
FVC-PRE: 1.32 L
FVC-Post: 1.74 L
POST FEV1/FVC RATIO: 47 %
PRE FEV1/FVC RATIO: 45 %
PRE FEV6/FVC RATIO: 98 %
Post FEV6/FVC ratio: 94 %
RV % PRED: 186 %
RV: 3.93 L
TLC % PRED: 113 %
TLC: 5.5 L

## 2016-12-05 MED ORDER — ALBUTEROL SULFATE (2.5 MG/3ML) 0.083% IN NEBU
2.5000 mg | INHALATION_SOLUTION | Freq: Once | RESPIRATORY_TRACT | Status: AC
  Administered 2016-12-05: 2.5 mg via RESPIRATORY_TRACT

## 2016-12-06 ENCOUNTER — Encounter: Payer: Self-pay | Admitting: Surgery

## 2016-12-06 ENCOUNTER — Ambulatory Visit (INDEPENDENT_AMBULATORY_CARE_PROVIDER_SITE_OTHER): Payer: Medicare Other | Admitting: Surgery

## 2016-12-06 VITALS — BP 132/58 | HR 59 | Resp 16 | Ht 62.5 in | Wt 111.4 lb

## 2016-12-06 DIAGNOSIS — R911 Solitary pulmonary nodule: Secondary | ICD-10-CM

## 2016-12-06 DIAGNOSIS — J432 Centrilobular emphysema: Secondary | ICD-10-CM | POA: Diagnosis not present

## 2016-12-06 DIAGNOSIS — R918 Other nonspecific abnormal finding of lung field: Secondary | ICD-10-CM

## 2016-12-08 ENCOUNTER — Encounter: Payer: Self-pay | Admitting: Surgery

## 2016-12-08 NOTE — Progress Notes (Signed)
HPI:  The patient returns today to review the results of her PFT's and make plans for further workup and treatment of a small RUL lung nodule. Her PFT's show severe obstructive lung disease with an FEV1 of 0.60 (27% predicted) improving to 0.81 after bronchodilator. Her DLCO is 37% of predicted.  Current Outpatient Prescriptions  Medication Sig Dispense Refill  . albuterol (PROVENTIL HFA;VENTOLIN HFA) 108 (90 BASE) MCG/ACT inhaler Inhale 2 puffs into the lungs every 6 (six) hours as needed. Shortness of Breath    . beclomethasone (QVAR) 40 MCG/ACT inhaler Inhale 2 puffs into the lungs 2 (two) times daily.    Marland Kitchen ibuprofen (ADVIL,MOTRIN) 200 MG tablet Take 400 mg by mouth every 6 (six) hours as needed. Pain    . Multiple Vitamin (MULTIVITAMIN WITH MINERALS) TABS Take 1 tablet by mouth daily.     No current facility-administered medications for this visit.      Physical Exam: BP (!) 132/58 (BP Location: Right Arm, Patient Position: Sitting, Cuff Size: Normal)   Pulse (!) 59   Resp 16   Ht 5' 2.5" (1.588 m)   Wt 111 lb 6.4 oz (50.5 kg)   SpO2 95% Comment: ON RA  BMI 20.05 kg/m  She looks comfortable.  Lungs are clear with distant breath sounds throughout.  Diagnostic Tests:  Pulmonary function test  Order: [id number]  Status:  Edited Result - FINAL Visible to patient:  No (Not Released) Next appt:  None Dx:  Lung nodule   Ref Range & Units 3d ago  FVC-Pre L 1.32   FVC-%Pred-Pre % 46   FVC-Post L 1.74   FVC-%Pred-Post % 61   FVC-%Change-Post % 31   FEV1-Pre L 0.60   FEV1-%Pred-Pre % 27   FEV1-Post L 0.81   FEV1-%Pred-Post % 37   FEV1-%Change-Post % 35   FEV6-Pre L 1.29   FEV6-%Pred-Pre % 47   FEV6-Post L 1.63   FEV6-%Pred-Post % 60   FEV6-%Change-Post % 25   Pre FEV1/FVC ratio % 45   FEV1FVC-%Pred-Pre % 59   Post FEV1/FVC ratio % 47   FEV1FVC-%Change-Post % 3   Pre FEV6/FVC Ratio % 98   FEV6FVC-%Pred-Pre % 103   Post FEV6/FVC ratio % 94   FEV6FVC-%Pred-Post %  98   FEV6FVC-%Change-Post % -4   FEF 25-75 Pre L/sec 0.27   FEF2575-%Pred-Pre % 14   FEF 25-75 Post L/sec 0.50   FEF2575-%Pred-Post % 26   FEF2575-%Change-Post % 82   RV L 3.93   RV % pred % 186   TLC L 5.50   TLC % pred % 113   DLCO unc ml/min/mmHg 8.30   DLCO unc % pred % 37   DL/VA ml/min/mmHg/L 2.38   DL/VA % pred % 51   Resulting Agency  [facility]    Specimen Collected: 12/05/16 08:42 Last Resulted: 12/05/16 09:33              Scans on Order [id number]   Scan on 12/05/2016 6:40 PM by Deneise Lever, MDScan on 12/05/2016 6:40 PM by Deneise Lever, MD               Impression:  The 0.6 x 0.8 cm stellate nodule in the RUL is slightly enlarged compared to a CT report in 11/2014. 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  It is still too small to have a reasonable chance of getting a useful biopsy either with CT or ENB.  I think it would require a lobectomy for complete removal and she is not a candidate for lobectomy given her poor lung function and shortness of breath with low level activity. There are multiple other small pulmonary nodules scattered throughout both lungs that are too small to characterize. I think it would be best to follow this RUL nodule for a while to see if it enlarges and then perform a biopsy and consider SBRT if the biopsy is positive for cancer. I reviewed her CT and PET scans again with her and her husband and answered their questions.  Plan:  Return in three months with a CT of the chest.   Gaye Pollack, MD [facility] [phone]

## 2016-12-13 ENCOUNTER — Encounter: Payer: PRIVATE HEALTH INSURANCE | Admitting: Surgery

## 2017-01-16 DIAGNOSIS — N959 Unspecified menopausal and perimenopausal disorder: Secondary | ICD-10-CM | POA: Diagnosis not present

## 2017-01-16 DIAGNOSIS — F419 Anxiety disorder, unspecified: Secondary | ICD-10-CM | POA: Diagnosis not present

## 2017-01-16 DIAGNOSIS — Z6820 Body mass index (BMI) 20.0-20.9, adult: Secondary | ICD-10-CM | POA: Diagnosis not present

## 2017-01-16 DIAGNOSIS — J440 Chronic obstructive pulmonary disease with acute lower respiratory infection: Secondary | ICD-10-CM | POA: Diagnosis not present

## 2017-02-02 ENCOUNTER — Other Ambulatory Visit: Payer: Self-pay | Admitting: Surgery

## 2017-02-02 DIAGNOSIS — R918 Other nonspecific abnormal finding of lung field: Secondary | ICD-10-CM

## 2017-03-14 ENCOUNTER — Ambulatory Visit (INDEPENDENT_AMBULATORY_CARE_PROVIDER_SITE_OTHER): Payer: Medicare Other | Admitting: Surgery

## 2017-03-14 ENCOUNTER — Ambulatory Visit
Admission: RE | Admit: 2017-03-14 | Discharge: 2017-03-14 | Disposition: A | Discharge status: Home / Self Care | Payer: Medicare Other | Source: Ambulatory Visit | Attending: Surgery | Admitting: Surgery

## 2017-03-14 ENCOUNTER — Encounter: Payer: Self-pay | Admitting: Surgery

## 2017-03-14 VITALS — BP 138/78 | HR 66 | Resp 20 | Ht 62.5 in | Wt 111.0 lb

## 2017-03-14 DIAGNOSIS — R911 Solitary pulmonary nodule: Secondary | ICD-10-CM

## 2017-03-14 DIAGNOSIS — R918 Other nonspecific abnormal finding of lung field: Secondary | ICD-10-CM

## 2017-03-14 NOTE — Progress Notes (Signed)
HPI:  The patient returns today for follow up of a small 6 mm x 8 mm RUL lung nodule. The lesion had low level hypermetabolism on PET and was felt to most likely be a small lung cancer. She has severe COPD and is on home oxygen at times. She no longer smokes. She is not a candidate for surgical removal.  Current Outpatient Prescriptions  Medication Sig Dispense Refill  . albuterol (PROVENTIL HFA;VENTOLIN HFA) 108 (90 BASE) MCG/ACT inhaler Inhale 2 puffs into the lungs every 6 (six) hours as needed. Shortness of Breath    . beclomethasone (QVAR) 40 MCG/ACT inhaler Inhale 2 puffs into the lungs 2 (two) times daily.    Marland Kitchen ibuprofen (ADVIL,MOTRIN) 200 MG tablet Take 400 mg by mouth every 6 (six) hours as needed. Pain    . Multiple Vitamin (MULTIVITAMIN WITH MINERALS) TABS Take 1 tablet by mouth daily.     No current facility-administered medications for this visit.      Physical Exam: BP 138/78   Pulse 66   Resp 20   Ht 5' 2.5" (1.588 m)   Wt 111 lb (50.3 kg)   SpO2 93%   BMI 19.98 kg/m   She looks well  Lungs are clear with distant breath sounds throughout.   Diagnostic Tests:  CT Chest Wo Contrast (Accession [id number]) (Order [id number])  Imaging  Date: 03/14/2017 Department: [facility] Released By: Nathanial Rancher Authorizing: Gaye Pollack, MD  Exam Information   Status Exam Begun  Exam Ended   Final [99] 03/14/2017 11:19 AM 03/14/2017 11:23 AM  PACS Images   Show images for CT Chest Wo Contrast  Study Result   CLINICAL DATA:  Followup pulmonary nodules.  EXAM: CT CHEST WITHOUT CONTRAST  TECHNIQUE: Multidetector CT imaging of the chest was performed following the standard protocol without IV contrast.  COMPARISON:  11/15/2016  FINDINGS: Cardiovascular: Aortic atherosclerosis. Calcification within the RCA, LAD coronary arteries. Normal heart size. No pericardial effusion.  Mediastinum/Nodes: No enlarged mediastinal or  axillary lymph nodes. Thyroid gland, trachea, and esophagus demonstrate no significant findings.  Lungs/Pleura: No pleural effusion. Moderate changes of centrilobular emphysema. Spiculated left apical lung nodule Measures 6 x 8 mm, image 19 of series 4. Unchanged from previous exam. Lateral right upper lobe lung nodule Measures 4 mm and is unchanged from previous exam. Right upper lobe lung nodule Measures 5 mm, image 32 of series 4. Unchanged from previous exam. Anterior basal right upper lobe perifissural nodule measures 6 mm, image 48 of series 4. Stable from previous exam chronic consolidate scratch set scarring in the lingula is identified, likely post infectious or inflammatory. Difficult to compare with previous 6 study due to differences in technique.  Upper Abdomen: No acute abnormality.  Musculoskeletal: No chest wall mass or suspicious bone lesions identified.  IMPRESSION: 1. Stable pulmonary nodules measuring up to 8 mm. Future CT at 18-24 months (11/15/2016) is is recommended for high-risk patients. This recommendation follows the consensus statement: Guidelines for Management of Incidental Pulmonary Nodules Detected on CT Images: From the Fleischner Society 2017; Radiology 2017; 284:228-243. 2.  Aortic Atherosclerosis (ICD10-I70.0). 3.  Emphysema (ICD10-J43.9). 4. Coronary artery calcifications.   Electronically Signed   By: Kerby Moors M.D.   On: 03/14/2017 11:43      Impression:  The dominant spiculated nodule in the RUL is stable at 6 mm x 8 mm compared to her last CT in 11/2016. This is still suspicious for a lung  cancer but is too small to biopsy. The other smaller pulmonary nodules are unchanged. She is doing well considering her severe COPD. I think it is best to continue following these nodules. She is not a candidate for surgical resection. I have personally reviewed and interpreted her CT scan and compared it to her previous scans. I reviewed  the images with her and her husband and answered their questions.  Plan:  I will see her back in 6 months with a CT scan of the chest.   Gaye Pollack, MD [facility] [phone]

## 2017-05-23 DIAGNOSIS — F419 Anxiety disorder, unspecified: Secondary | ICD-10-CM | POA: Diagnosis not present

## 2017-05-23 DIAGNOSIS — N959 Unspecified menopausal and perimenopausal disorder: Secondary | ICD-10-CM | POA: Diagnosis not present

## 2017-05-23 DIAGNOSIS — Z6820 Body mass index (BMI) 20.0-20.9, adult: Secondary | ICD-10-CM | POA: Diagnosis not present

## 2017-05-23 DIAGNOSIS — J440 Chronic obstructive pulmonary disease with acute lower respiratory infection: Secondary | ICD-10-CM | POA: Diagnosis not present

## 2017-05-23 DIAGNOSIS — Z87891 Personal history of nicotine dependence: Secondary | ICD-10-CM | POA: Diagnosis not present

## 2017-08-17 ENCOUNTER — Other Ambulatory Visit: Payer: Self-pay | Admitting: Surgery

## 2017-08-17 DIAGNOSIS — R911 Solitary pulmonary nodule: Secondary | ICD-10-CM

## 2017-09-12 DIAGNOSIS — J449 Chronic obstructive pulmonary disease, unspecified: Secondary | ICD-10-CM | POA: Diagnosis not present

## 2017-09-12 DIAGNOSIS — K9189 Other postprocedural complications and disorders of digestive system: Secondary | ICD-10-CM | POA: Diagnosis not present

## 2017-09-12 DIAGNOSIS — Z23 Encounter for immunization: Secondary | ICD-10-CM | POA: Diagnosis not present

## 2017-09-12 DIAGNOSIS — D3A020 Benign carcinoid tumor of the appendix: Secondary | ICD-10-CM | POA: Diagnosis not present

## 2017-09-12 DIAGNOSIS — R918 Other nonspecific abnormal finding of lung field: Secondary | ICD-10-CM | POA: Diagnosis not present

## 2017-09-12 DIAGNOSIS — Z6823 Body mass index (BMI) 23.0-23.9, adult: Secondary | ICD-10-CM | POA: Diagnosis not present

## 2017-09-12 DIAGNOSIS — K353 Acute appendicitis with localized peritonitis, without perforation or gangrene: Secondary | ICD-10-CM | POA: Diagnosis not present

## 2017-09-12 DIAGNOSIS — R000 Tachycardia, unspecified: Secondary | ICD-10-CM | POA: Diagnosis not present

## 2017-09-12 DIAGNOSIS — Z87891 Personal history of nicotine dependence: Secondary | ICD-10-CM | POA: Diagnosis not present

## 2017-09-12 DIAGNOSIS — Z4682 Encounter for fitting and adjustment of non-vascular catheter: Secondary | ICD-10-CM | POA: Diagnosis not present

## 2017-09-12 DIAGNOSIS — K3531 Acute appendicitis with localized peritonitis and gangrene, without perforation: Secondary | ICD-10-CM | POA: Diagnosis not present

## 2017-09-12 DIAGNOSIS — K567 Ileus, unspecified: Secondary | ICD-10-CM

## 2017-09-12 DIAGNOSIS — E871 Hypo-osmolality and hyponatremia: Secondary | ICD-10-CM | POA: Diagnosis not present

## 2017-09-12 DIAGNOSIS — K3532 Acute appendicitis with perforation and localized peritonitis, without abscess: Secondary | ICD-10-CM | POA: Diagnosis not present

## 2017-09-12 DIAGNOSIS — F17200 Nicotine dependence, unspecified, uncomplicated: Secondary | ICD-10-CM | POA: Diagnosis not present

## 2017-09-12 DIAGNOSIS — J441 Chronic obstructive pulmonary disease with (acute) exacerbation: Secondary | ICD-10-CM | POA: Diagnosis not present

## 2017-09-12 DIAGNOSIS — R109 Unspecified abdominal pain: Secondary | ICD-10-CM | POA: Diagnosis not present

## 2017-09-12 DIAGNOSIS — K352 Acute appendicitis with generalized peritonitis, without abscess: Secondary | ICD-10-CM | POA: Diagnosis not present

## 2017-09-12 DIAGNOSIS — Z452 Encounter for adjustment and management of vascular access device: Secondary | ICD-10-CM | POA: Diagnosis not present

## 2017-09-12 DIAGNOSIS — C181 Malignant neoplasm of appendix: Secondary | ICD-10-CM | POA: Diagnosis present

## 2017-09-12 DIAGNOSIS — R1110 Vomiting, unspecified: Secondary | ICD-10-CM | POA: Diagnosis not present

## 2017-09-12 DIAGNOSIS — K5901 Slow transit constipation: Secondary | ICD-10-CM | POA: Diagnosis not present

## 2017-09-12 DIAGNOSIS — R1084 Generalized abdominal pain: Secondary | ICD-10-CM | POA: Diagnosis not present

## 2017-09-12 DIAGNOSIS — Z7951 Long term (current) use of inhaled steroids: Secondary | ICD-10-CM | POA: Diagnosis not present

## 2017-09-12 DIAGNOSIS — K3580 Unspecified acute appendicitis: Secondary | ICD-10-CM | POA: Diagnosis not present

## 2017-09-12 HISTORY — DX: Other postprocedural complications and disorders of digestive system: K56.7

## 2017-09-12 HISTORY — PX: LAPAROSCOPIC APPENDECTOMY: SUR753

## 2017-09-26 ENCOUNTER — Other Ambulatory Visit: Payer: Medicare Other

## 2017-09-26 ENCOUNTER — Ambulatory Visit: Payer: Medicare Other | Admitting: Surgery

## 2017-10-03 ENCOUNTER — Encounter: Payer: Self-pay | Admitting: Surgery

## 2017-10-03 ENCOUNTER — Ambulatory Visit (INDEPENDENT_AMBULATORY_CARE_PROVIDER_SITE_OTHER): Payer: Medicare Other | Admitting: Surgery

## 2017-10-03 ENCOUNTER — Ambulatory Visit
Admission: RE | Admit: 2017-10-03 | Discharge: 2017-10-03 | Disposition: A | Discharge status: Home / Self Care | Payer: Medicare Other | Source: Ambulatory Visit | Attending: Surgery | Admitting: Surgery

## 2017-10-03 ENCOUNTER — Other Ambulatory Visit: Payer: Self-pay

## 2017-10-03 VITALS — BP 119/75 | HR 95 | Ht 62.5 in | Wt 111.0 lb

## 2017-10-03 DIAGNOSIS — R918 Other nonspecific abnormal finding of lung field: Secondary | ICD-10-CM

## 2017-10-03 DIAGNOSIS — R911 Solitary pulmonary nodule: Secondary | ICD-10-CM

## 2017-10-07 ENCOUNTER — Encounter: Payer: Self-pay | Admitting: Surgery

## 2017-10-07 NOTE — Progress Notes (Signed)
HPI:  The patient returns today for follow up of a small 6 mm x 8 mm RUL lung nodule. The lesion had low level hypermetabolism on PET and was felt to most likely be a small lung cancer. She has other smaller pulmonary nodules that have been unchanged. She has severe COPD and is on home oxygen at times. She no longer smokes. She is not a candidate for surgical removal. It was felt that the dominant nodule in the RUL was too small to biopsy.    Current Outpatient Medications  Medication Sig Dispense Refill  . albuterol (PROVENTIL HFA;VENTOLIN HFA) 108 (90 BASE) MCG/ACT inhaler Inhale 2 puffs into the lungs every 6 (six) hours as needed. Shortness of Breath    . beclomethasone (QVAR) 40 MCG/ACT inhaler Inhale 2 puffs into the lungs 2 (two) times daily.    Marland Kitchen HYDROcodone-acetaminophen (NORCO/VICODIN) 5-325 MG tablet     . ibuprofen (ADVIL,MOTRIN) 200 MG tablet Take 400 mg by mouth every 6 (six) hours as needed. Pain    . Multiple Vitamin (MULTIVITAMIN WITH MINERALS) TABS Take 1 tablet by mouth daily.     No current facility-administered medications for this visit.      Physical Exam: BP 119/75   Pulse 95   Ht 5' 2.5" (1.588 m)   Wt 111 lb (50.3 kg)   SpO2 97%   BMI 19.98 kg/m  She looks well There is no cervical or supraclavicular adenopathy Lung exam reveals distant breath sounds throughout   Diagnostic Tests:  CLINICAL DATA:  Solitary pulmonary nodule  EXAM: CT CHEST WITHOUT CONTRAST  TECHNIQUE: Multidetector CT imaging of the chest was performed following the standard protocol without IV contrast. Sagittal and coronal MPR images reconstructed from axial data set.  COMPARISON:  03/14/2017, 11/15/2016 PET/CT  FINDINGS: Cardiovascular: Atherosclerotic calcification aorta, proximal great vessels and coronary arteries. Aorta normal caliber. Minimal pericardial effusion.  Mediastinum/Nodes: Esophagus normal appearance. Base of cervical region normal appearance.  Few normal sized mediastinal lymph nodes without thoracic adenopathy.  Lungs/Pleura: Scattered emphysematous changes and mild central peribronchial thickening. Subsegmental atelectasis at medial aspect of RIGHT upper lobe. Multiple pulmonary nodules. 9 mm diameter nodule anterior aspect RIGHT upper lobe near minor fissure sagittal image 44 previously 7 mm. 5 x 8 mm RIGHT apex nodule image 24 unchanged. Additional tiny nodular foci in RIGHT lung appear stable. New focus of probable atelectasis or scarring at posterior RIGHT upper lobe image 17, measuring 15 x 9 mm. Ground-glass opacity medial LEFT lower lobe image 92 measuring 9 mm diameter unchanged. Subsegmental atelectasis in lingula unchanged. No acute infiltrate, pleural effusion or pneumothorax.  Upper Abdomen: Visualized upper abdomen unremarkable.  Musculoskeletal: No acute osseous findings.  IMPRESSION: Emphysematous changes with subsegmental atelectasis in anterior RIGHT upper lobe and lingula.  New 15 x 9 mm area of peripheral density in posterior RIGHT upper lobe near apex favor atelectasis or versus developing scarring over nodule.  Multiple pulmonary nodules RIGHT greater than LEFT with note of a stable ground-glass opacity in the LEFT lower lobe 9 mm diameter and a 9 mm diameter nodule in the anterior RIGHT upper lobe near the minor fissure which has increased in size since the previous exam when it measured 7 mm diameter.  This 9 mm RIGHT upper lobe nodule is suspicious for malignancy and either tissue diagnosis or further assessment by repeat PET-CT recommended.  Stability remaining nodules can be assessed by follow-up CT imaging in 1 year.  Aortic Atherosclerosis (ICD10-I70.0) and Emphysema (ICD10-J43.9).  Electronically Signed   By: Lavonia Dana M.D.   On: 10/03/2017 10:30   Impression:  The dominant RUL lung nodule is felt to be slightly larger at 9 mm compared to the previous CT when  it was 7 mm. There is a new area of peripheral density in the posterior RUL near the apex that looks more like atelectasis or scar. The other pulmonary nodules are stable. I think this RUL nodule still too small to reliably get a CT guided needle biopsy especially considering her degree of lung disease. She is not an operative candidate so I would follow this for now and repeat her CT scan in 6 months. If it does progressively enlarge it could be biopsied at some point and she could be a candidate for XRT. I reviewed the CT images with the patient and her husband and answered their questions. They are in agreement with that plan.  Plan:  Follow up in 6 months with a CT scan of the chest.   I spent 15 minutes performing this established patient evaluation and > 50% of this time was spent face to face counseling and coordinating the care of this patient's lung nodules.    Gaye Pollack, MD [facility] [phone]

## 2017-10-09 DIAGNOSIS — J449 Chronic obstructive pulmonary disease, unspecified: Secondary | ICD-10-CM | POA: Diagnosis not present

## 2017-10-09 DIAGNOSIS — R1084 Generalized abdominal pain: Secondary | ICD-10-CM | POA: Diagnosis not present

## 2017-10-09 DIAGNOSIS — I700 Atherosclerosis of aorta: Secondary | ICD-10-CM | POA: Diagnosis not present

## 2017-10-09 DIAGNOSIS — C181 Malignant neoplasm of appendix: Secondary | ICD-10-CM | POA: Diagnosis not present

## 2017-10-09 DIAGNOSIS — Z87891 Personal history of nicotine dependence: Secondary | ICD-10-CM | POA: Diagnosis not present

## 2017-10-09 DIAGNOSIS — C7A020 Malignant carcinoid tumor of the appendix: Secondary | ICD-10-CM | POA: Diagnosis not present

## 2017-10-09 DIAGNOSIS — R918 Other nonspecific abnormal finding of lung field: Secondary | ICD-10-CM | POA: Diagnosis not present

## 2017-10-11 DIAGNOSIS — C181 Malignant neoplasm of appendix: Secondary | ICD-10-CM | POA: Diagnosis not present

## 2017-10-11 DIAGNOSIS — K5903 Drug induced constipation: Secondary | ICD-10-CM | POA: Diagnosis not present

## 2017-10-12 DIAGNOSIS — C181 Malignant neoplasm of appendix: Secondary | ICD-10-CM | POA: Diagnosis not present

## 2017-10-15 ENCOUNTER — Other Ambulatory Visit (HOSPITAL_COMMUNITY): Payer: Self-pay | Admitting: Hematology and Oncology

## 2017-10-15 DIAGNOSIS — D3A029 Benign carcinoid tumor of the large intestine, unspecified portion: Secondary | ICD-10-CM

## 2017-10-15 DIAGNOSIS — C181 Malignant neoplasm of appendix: Secondary | ICD-10-CM

## 2017-10-17 ENCOUNTER — Encounter (HOSPITAL_COMMUNITY)
Admission: RE | Admit: 2017-10-17 | Discharge: 2017-10-17 | Disposition: A | Discharge status: Home / Self Care | Payer: Medicare Other | Source: Ambulatory Visit | Attending: Hematology and Oncology | Admitting: Hematology and Oncology

## 2017-10-17 DIAGNOSIS — D3A029 Benign carcinoid tumor of the large intestine, unspecified portion: Secondary | ICD-10-CM | POA: Insufficient documentation

## 2017-10-17 DIAGNOSIS — C181 Malignant neoplasm of appendix: Secondary | ICD-10-CM | POA: Insufficient documentation

## 2017-10-17 DIAGNOSIS — R918 Other nonspecific abnormal finding of lung field: Secondary | ICD-10-CM | POA: Diagnosis not present

## 2017-10-17 LAB — GLUCOSE, CAPILLARY: Glucose-Capillary: 94 mg/dL (ref 65–99)

## 2017-10-17 MED ORDER — FLUDEOXYGLUCOSE F - 18 (FDG) INJECTION
6.5400 | Freq: Once | INTRAVENOUS | Status: AC | PRN
  Administered 2017-10-17: 6.54 via INTRAVENOUS

## 2017-10-22 DIAGNOSIS — C7A020 Malignant carcinoid tumor of the appendix: Secondary | ICD-10-CM | POA: Diagnosis not present

## 2017-10-29 DIAGNOSIS — D3A029 Benign carcinoid tumor of the large intestine, unspecified portion: Secondary | ICD-10-CM | POA: Diagnosis not present

## 2017-10-29 DIAGNOSIS — Z87891 Personal history of nicotine dependence: Secondary | ICD-10-CM | POA: Diagnosis not present

## 2017-10-29 DIAGNOSIS — N959 Unspecified menopausal and perimenopausal disorder: Secondary | ICD-10-CM | POA: Diagnosis not present

## 2017-10-29 DIAGNOSIS — F419 Anxiety disorder, unspecified: Secondary | ICD-10-CM | POA: Diagnosis not present

## 2017-10-29 DIAGNOSIS — Z6822 Body mass index (BMI) 22.0-22.9, adult: Secondary | ICD-10-CM | POA: Diagnosis not present

## 2017-10-29 DIAGNOSIS — J440 Chronic obstructive pulmonary disease with acute lower respiratory infection: Secondary | ICD-10-CM | POA: Diagnosis not present

## 2017-10-29 DIAGNOSIS — R918 Other nonspecific abnormal finding of lung field: Secondary | ICD-10-CM | POA: Diagnosis not present

## 2017-10-29 DIAGNOSIS — J449 Chronic obstructive pulmonary disease, unspecified: Secondary | ICD-10-CM | POA: Diagnosis not present

## 2017-10-29 DIAGNOSIS — Z23 Encounter for immunization: Secondary | ICD-10-CM | POA: Diagnosis not present

## 2017-10-29 DIAGNOSIS — Z1231 Encounter for screening mammogram for malignant neoplasm of breast: Secondary | ICD-10-CM | POA: Diagnosis not present

## 2017-10-30 ENCOUNTER — Other Ambulatory Visit: Payer: Self-pay | Admitting: *Deleted

## 2017-10-30 DIAGNOSIS — D381 Neoplasm of uncertain behavior of trachea, bronchus and lung: Secondary | ICD-10-CM

## 2017-11-01 ENCOUNTER — Other Ambulatory Visit: Payer: Self-pay | Admitting: Student

## 2017-11-01 ENCOUNTER — Other Ambulatory Visit: Payer: Self-pay | Admitting: Radiology

## 2017-11-02 ENCOUNTER — Ambulatory Visit (HOSPITAL_COMMUNITY)
Admission: RE | Admit: 2017-11-02 | Discharge: 2017-11-02 | Disposition: A | Discharge status: Home / Self Care | Payer: Medicare Other | Source: Ambulatory Visit | Attending: Interventional Radiology | Admitting: Interventional Radiology

## 2017-11-02 ENCOUNTER — Ambulatory Visit (HOSPITAL_COMMUNITY)
Admission: RE | Admit: 2017-11-02 | Discharge: 2017-11-02 | Disposition: A | Discharge status: Home / Self Care | Payer: Medicare Other | Source: Ambulatory Visit | Attending: Surgery | Admitting: Surgery

## 2017-11-02 DIAGNOSIS — Z79899 Other long term (current) drug therapy: Secondary | ICD-10-CM | POA: Diagnosis not present

## 2017-11-02 DIAGNOSIS — Z809 Family history of malignant neoplasm, unspecified: Secondary | ICD-10-CM | POA: Diagnosis not present

## 2017-11-02 DIAGNOSIS — Z8249 Family history of ischemic heart disease and other diseases of the circulatory system: Secondary | ICD-10-CM | POA: Insufficient documentation

## 2017-11-02 DIAGNOSIS — J95811 Postprocedural pneumothorax: Secondary | ICD-10-CM | POA: Insufficient documentation

## 2017-11-02 DIAGNOSIS — J939 Pneumothorax, unspecified: Secondary | ICD-10-CM | POA: Diagnosis not present

## 2017-11-02 DIAGNOSIS — Z9842 Cataract extraction status, left eye: Secondary | ICD-10-CM | POA: Insufficient documentation

## 2017-11-02 DIAGNOSIS — C3411 Malignant neoplasm of upper lobe, right bronchus or lung: Secondary | ICD-10-CM | POA: Insufficient documentation

## 2017-11-02 DIAGNOSIS — D381 Neoplasm of uncertain behavior of trachea, bronchus and lung: Secondary | ICD-10-CM | POA: Diagnosis not present

## 2017-11-02 DIAGNOSIS — Z90710 Acquired absence of both cervix and uterus: Secondary | ICD-10-CM | POA: Insufficient documentation

## 2017-11-02 DIAGNOSIS — R911 Solitary pulmonary nodule: Secondary | ICD-10-CM | POA: Diagnosis not present

## 2017-11-02 DIAGNOSIS — J449 Chronic obstructive pulmonary disease, unspecified: Secondary | ICD-10-CM | POA: Insufficient documentation

## 2017-11-02 DIAGNOSIS — Z800 Family history of malignant neoplasm of digestive organs: Secondary | ICD-10-CM | POA: Insufficient documentation

## 2017-11-02 DIAGNOSIS — Z9841 Cataract extraction status, right eye: Secondary | ICD-10-CM | POA: Insufficient documentation

## 2017-11-02 DIAGNOSIS — Z87891 Personal history of nicotine dependence: Secondary | ICD-10-CM | POA: Diagnosis not present

## 2017-11-02 LAB — CBC
HEMATOCRIT: 40.2 % (ref 36.0–46.0)
HEMOGLOBIN: 13.2 g/dL (ref 12.0–15.0)
MCH: 31.4 pg (ref 26.0–34.0)
MCHC: 32.8 g/dL (ref 30.0–36.0)
MCV: 95.5 fL (ref 78.0–100.0)
Platelets: 185 10*3/uL (ref 150–400)
RBC: 4.21 MIL/uL (ref 3.87–5.11)
RDW: 12.7 % (ref 11.5–15.5)
WBC: 6.8 10*3/uL (ref 4.0–10.5)

## 2017-11-02 LAB — PROTIME-INR
INR: 0.95
PROTHROMBIN TIME: 12.6 s (ref 11.4–15.2)

## 2017-11-02 LAB — APTT: APTT: 30 s (ref 24–36)

## 2017-11-02 MED ORDER — MIDAZOLAM HCL 2 MG/2ML IJ SOLN
INTRAMUSCULAR | Status: AC | PRN
Start: 2017-11-02 — End: 2017-11-02
  Administered 2017-11-02 (×2): 1 mg via INTRAVENOUS

## 2017-11-02 MED ORDER — MIDAZOLAM HCL 2 MG/2ML IJ SOLN
INTRAMUSCULAR | Status: AC
Start: 2017-11-02 — End: 2017-11-02
  Filled 2017-11-02: qty 4

## 2017-11-02 MED ORDER — FENTANYL CITRATE (PF) 100 MCG/2ML IJ SOLN
INTRAMUSCULAR | Status: AC | PRN
  Administered 2017-11-02 (×2): 50 ug via INTRAVENOUS

## 2017-11-02 MED ORDER — LIDOCAINE HCL 1 % IJ SOLN
INTRAMUSCULAR | Status: AC
  Filled 2017-11-02: qty 20

## 2017-11-02 MED ORDER — SODIUM CHLORIDE 0.9 % IV SOLN: INTRAVENOUS | Status: DC

## 2017-11-02 MED ORDER — FENTANYL CITRATE (PF) 100 MCG/2ML IJ SOLN
INTRAMUSCULAR | Status: AC
  Filled 2017-11-02: qty 4

## 2017-11-02 MED ORDER — SODIUM CHLORIDE 0.9 % IV SOLN
INTRAVENOUS | Status: AC | PRN
  Administered 2017-11-02: 10 mL/h via INTRAVENOUS

## 2017-11-02 NOTE — Sedation Documentation (Signed)
Patient is resting comfortably. 

## 2017-11-02 NOTE — Discharge Instructions (Addendum)
Needle Biopsy of the Lung, Care After °This sheet gives you information about how to care for yourself after your procedure. Your health care provider may also give you more specific instructions. If you have problems or questions, contact your health care provider. °What can I expect after the procedure? °After the procedure, it is common to have: °· Soreness, pain, and tenderness where a tissue sample was taken (biopsy site). °· A cough. °· A sore throat. ° °Follow these instructions at home: °Biopsy site care °· Follow instructions from your health care provider about when to remove the bandage that was placed on the biopsy site. °· Keep the bandage dry until it has been removed. °· Check your biopsy site every day for signs of infection. Check for: °? More redness, swelling, or pain. °? More fluid or blood. °? Warmth to the touch. °? Pus or a bad smell. °General instructions °· Rest as directed by your health care provider. Ask your health care provider what activities are safe for you. °· Do not take baths, swim, or use a hot tub until your health care provider approves. °· Take over-the-counter and prescription medicines only as told by your health care provider. °· If you have airplane travel scheduled, talk with your health care provider about when it is safe for you to travel by airplane. °· It is up to you to get the results of your procedure. Ask your health care provider, or the department that is doing the procedure, when your results will be ready. °· Keep all follow-up visits as told by your health care provider. This is important. °Contact a health care provider if: °· You have more redness, swelling, or pain around your biopsy site. °· You have more fluid or blood coming from your biopsy site. °· Your biopsy site feels warm to the touch. °· You have pus or a bad smell coming from your biopsy site. °· You have a fever. °· You have pain that does not get better with medicine. °Get help right away  if: °· You have problems breathing. °· You have chest pain. °· You cough up blood. °· You faint. °· You have a fast heart rate. °Summary °· After a needle biopsy of the lung, it is common to have a cough, a sore throat, or soreness, pain, and tenderness where a tissue sample was taken (biopsy site). °· You should check your biopsy area every day for signs of infection, including pus or a bad smell, warmth, more fluid or blood, or more redness, swelling, or pain. °· You should not take baths, swim, or use a hot tub until your health care provider approves. °· It is up to you to get the results of your procedure. Ask your health care provider, or the department that is doing the procedure, when your results will be ready. °This information is not intended to replace advice given to you by your health care provider. Make sure you discuss any questions you have with your health care provider. °Document Released: 09/10/2007 Document Revised: 10/04/2016 Document Reviewed: 10/04/2016 °Elsevier Interactive Patient Education © 2017 [employer] ° °Moderate Conscious Sedation, Adult, Care After °These instructions provide you with information about caring for yourself after your procedure. Your health care provider may also give you more specific instructions. Your treatment has been planned according to current medical practices, but problems sometimes occur. Call your health care provider if you have any problems or questions after your procedure. °What can I expect after the   procedure? °After your procedure, it is common: °· To feel sleepy for several hours. °· To feel clumsy and have poor balance for several hours. °· To have poor judgment for several hours. °· To vomit if you eat too soon. ° °Follow these instructions at home: °For at least 24 hours after the procedure: ° °· Do not: °? Participate in activities where you could fall or become injured. °? Drive. °? Use heavy machinery. °? Drink alcohol. °? Take sleeping  pills or medicines that cause drowsiness. °? Make important decisions or sign legal documents. °? Take care of children on your own. °· Rest. °Eating and drinking °· Follow the diet recommended by your health care provider. °· If you vomit: °? Drink water, juice, or soup when you can drink without vomiting. °? Make sure you have little or no nausea before eating solid foods. °General instructions °· Have a responsible adult stay with you until you are awake and alert. °· Take over-the-counter and prescription medicines only as told by your health care provider. °· If you smoke, do not smoke without supervision. °· Keep all follow-up visits as told by your health care provider. This is important. °Contact a health care provider if: °· You keep feeling nauseous or you keep vomiting. °· You feel light-headed. °· You develop a rash. °· You have a fever. °Get help right away if: °· You have trouble breathing. °This information is not intended to replace advice given to you by your health care provider. Make sure you discuss any questions you have with your health care provider. °Document Released: 09/03/2013 Document Revised: 04/17/2016 Document Reviewed: 03/04/2016 °Elsevier Interactive Patient Education © 2018 [employer] ° °

## 2017-11-02 NOTE — Sedation Documentation (Signed)
22 gauge iv started in left [facility] for blood sample

## 2017-11-02 NOTE — H&P (Signed)
Chief Complaint: Patient was seen in consultation today for lung nodule  Referring Physician(s): Gaye Pollack  Supervising Physician: Jacqulynn Cadet  Patient Status: [facility] - Out-pt  History of Present Illness: Melissa Golden is a 71 y.o. female with past medical history of COPD, asthma who presents with enlarging lung nodule.   CT Chest 10/03/17: Emphysematous changes with subsegmental atelectasis in anterior RIGHT upper lobe and lingula.  New 15 x 9 mm area of peripheral density in posterior RIGHT upper lobe near apex favor atelectasis or versus developing scarring over nodule.  Multiple pulmonary nodules RIGHT greater than LEFT with note of a stable ground-glass opacity in the LEFT lower lobe 9 mm diameter and a 9 mm diameter nodule in the anterior RIGHT upper lobe near the minor fissure which has increased in size since the previous exam when it measured 7 mm diameter.  This 9 mm RIGHT upper lobe nodule is suspicious for malignancy and either tissue diagnosis or further assessment by repeat PET-CT recommended.  Stability remaining nodules can be assessed by follow-up CT imaging in 1 year.  IR consulted for lung nodule biopsy at the request of Dr. Cyndia Bent.  Patient has been NPO She is not on blood thinners.   Past Medical History:  Diagnosis Date  . Asthma   . COPD (chronic obstructive pulmonary disease) ([place])   . Shortness of breath    with exertion    Past Surgical History:  Procedure Laterality Date  . ABDOMINAL HYSTERECTOMY    . CATARACT EXTRACTION W/ INTRAOCULAR LENS IMPLANT     Right  . CATARACT EXTRACTION W/PHACO  01/02/2013   Procedure: CATARACT EXTRACTION PHACO AND INTRAOCULAR LENS PLACEMENT ([facility]);  Surgeon: Tonny Branch, MD;  Location: [facility];  Service: Ophthalmology;  Laterality: Left;  CDE: 16.19    Allergies: Patient has no known allergies.  Medications: Prior to Admission medications   Medication Sig Start Date End Date Taking?  Authorizing Provider  albuterol (PROVENTIL HFA;VENTOLIN HFA) 108 (90 BASE) MCG/ACT inhaler Inhale 2 puffs into the lungs every 6 (six) hours as needed. Shortness of Breath   Yes [provider]  budesonide (PULMICORT) 180 MCG/ACT inhaler Inhale 2 puffs into the lungs 2 (two) times daily.   Yes [provider]  ibuprofen (ADVIL,MOTRIN) 200 MG tablet Take 400 mg by mouth every 6 (six) hours as needed. Pain   Yes [provider]  Multiple Vitamin (MULTIVITAMIN WITH MINERALS) TABS Take 1 tablet by mouth daily.   Yes [provider]     Family History  Problem Relation Age of Onset  . Cancer Mother   . Heart attack Father   . Cancer - Colon Sister     Social History   Socioeconomic History  . Marital status: Married    Spouse name: Not on file  . Number of children: Not on file  . Years of education: Not on file  . Highest education level: Not on file  Social Needs  . Financial resource strain: Not on file  . Food insecurity - worry: Not on file  . Food insecurity - inability: Not on file  . Transportation needs - medical: Not on file  . Transportation needs - non-medical: Not on file  Occupational History  . Not on file  Tobacco Use  . Smoking status: Former Smoker    Packs/day: 0.25    Years: 1.00    Pack years: 0.25    Types: Cigarettes    Last attempt to quit: 10/2016  Years since quitting: 1.0  . Smokeless tobacco: Never Used  Substance and Sexual Activity  . Alcohol use: Yes    Alcohol/week: 3.6 oz    Types: 6 Cans of beer per week  . Drug use: No  . Sexual activity: Not on file  Other Topics Concern  . Not on file  Social History Narrative  . Not on file    Review of Systems  Constitutional: Negative for fatigue and fever.  Respiratory: Positive for cough. Negative for shortness of breath.   Cardiovascular: Negative for chest pain.  Gastrointestinal: Negative for abdominal pain.  Psychiatric/Behavioral: Negative for  behavioral problems and confusion.    Vital Signs: BP 136/84   Pulse 66   Temp 98 F (36.7 C)   Resp 18   Ht 5\' 2"  (1.575 m)   Wt 111 lb (50.3 kg)   SpO2 97%   BMI 20.30 kg/m   Physical Exam  Constitutional: She appears well-developed.  Cardiovascular: Normal rate, regular rhythm and normal heart sounds.  Pulmonary/Chest: Effort normal and breath sounds normal. No respiratory distress.  Abdominal: Soft.  Skin: Skin is warm and dry.  Psychiatric: She has a normal mood and affect. Her behavior is normal. Judgment and thought content normal.  Nursing note and vitals reviewed.   Imaging: Nm Pet Image Initial (pi) Skull Base To Thigh  Result Date: 10/17/2017 CLINICAL DATA:  Subsequent treatment strategy for  Lung nodules. EXAM: NUCLEAR MEDICINE PET SKULL BASE TO THIGH TECHNIQUE: 6.5 mCi F-18 FDG was injected intravenously. Full-ring PET imaging was performed from the skull base to thigh after the radiotracer. CT data was obtained and used for attenuation correction and anatomic localization. FASTING BLOOD GLUCOSE:  Value: 94 mg/dl COMPARISON:  PET-CT 11/15/2016, CT 10/03/2017 FINDINGS: NECK No hypermetabolic lymph nodes in the neck. CHEST RIGHT upper lobe nodule measuring 9 mm (image 90 67, series 4) increased from 4 mm on PET-CT 11/15/2016. Lesion is hypermetabolic with SUV max equals 7.1. In the more superior RIGHT upper lobe, 4 mm nodule (image 50, series 4) has associated metabolic activity with SUV max equal 3.6. Previously these 2 nodules had low metabolic activity less than blood pool. Third lesion in the RIGHT upper lobe measuring 4 mm (image 56, series 4) has faint metabolic activity. No hypermetabolic mediastinal lymph nodes. ABDOMEN/PELVIS No abnormal hypermetabolic activity within the liver, pancreas, adrenal glands, or spleen. No hypermetabolic lymph nodes in the abdomen or pelvis. Partial RIGHT hemicolectomy. Scattered activity in small bowel without focal lesion on CT There is  metabolic activity along the ventral abdominal scar midline (image 129, series 4) with SUV max equal 3.6 . SKELETON No focal hypermetabolic activity to suggest skeletal metastasis. IMPRESSION: 1. Three hypermetabolic RIGHT upper lobe pulmonary nodules increased in size and metabolic activity from comparison PET-CT scan are concerning for primary malignancies or metastatic disease. 2. No mediastinal nodal metastasis 3. No clear evidence of malignancy in the abdomen or pelvis. 4. Mild activity along the ventral abdominal surgical scar. Partial RIGHT hemicolectomy. Recommend correlation with past surgical and pathology records (not available). Electronically Signed   By: Suzy Bouchard M.D.   On: 10/17/2017 12:19    Labs:  CBC: Recent Labs    11/02/17 0930  WBC 6.8  HGB 13.2  HCT 40.2  PLT 185    COAGS: Recent Labs    11/02/17 0930  INR 0.95  APTT 30    BMP: No results for input(s): NA, K, CL, CO2, GLUCOSE, BUN, CALCIUM, CREATININE, GFRNONAA, GFRAA  in the last 8760 hours.  Invalid input(s): CMP  LIVER FUNCTION TESTS: No results for input(s): BILITOT, AST, ALT, ALKPHOS, PROT, ALBUMIN in the last 8760 hours.  TUMOR MARKERS: No results for input(s): AFPTM, CEA, CA199, CHROMGRNA in the last 8760 hours.  Assessment and Plan: Patient with past medical history of COPD presents with complaint of enlarging lung nodule.  IR consulted for lung nodule biopsy at the request of Dr. Cyndia Bent. Case reviewed by Dr. Vernard Gambles who approves patient for procedure.  Patient presents today in their usual state of health.  She has been NPO and is not currently on blood thinners.  Risks and benefits discussed with the patient including, but not limited to bleeding, hemoptysis, respiratory failure requiring intubation, infection, pneumothorax requiring chest tube placement, stroke from air embolism or even death. All of the patient's questions were answered, patient is agreeable to proceed. Consent signed  and in chart.  Thank you for this interesting consult.  I greatly enjoyed meeting Melissa Golden and look forward to participating in their care.  A copy of this report was sent to the requesting provider on this date.  Electronically Signed: Docia Barrier, PA 11/02/2017, 10:58 AM   I spent a total of  30 Minutes   in face to face in clinical consultation, greater than 50% of which was counseling/coordinating care for lung nodule.

## 2017-11-08 DIAGNOSIS — Z6822 Body mass index (BMI) 22.0-22.9, adult: Secondary | ICD-10-CM | POA: Diagnosis not present

## 2017-11-08 DIAGNOSIS — J440 Chronic obstructive pulmonary disease with acute lower respiratory infection: Secondary | ICD-10-CM | POA: Diagnosis not present

## 2017-11-09 DIAGNOSIS — C181 Malignant neoplasm of appendix: Secondary | ICD-10-CM | POA: Diagnosis not present

## 2017-11-09 DIAGNOSIS — Z9049 Acquired absence of other specified parts of digestive tract: Secondary | ICD-10-CM | POA: Diagnosis not present

## 2017-11-09 DIAGNOSIS — C3411 Malignant neoplasm of upper lobe, right bronchus or lung: Secondary | ICD-10-CM | POA: Diagnosis not present

## 2017-11-09 DIAGNOSIS — D3A029 Benign carcinoid tumor of the large intestine, unspecified portion: Secondary | ICD-10-CM | POA: Diagnosis not present

## 2017-11-09 DIAGNOSIS — C7A020 Malignant carcinoid tumor of the appendix: Secondary | ICD-10-CM | POA: Diagnosis not present

## 2017-11-09 DIAGNOSIS — J449 Chronic obstructive pulmonary disease, unspecified: Secondary | ICD-10-CM | POA: Diagnosis not present

## 2017-11-13 DIAGNOSIS — Z1231 Encounter for screening mammogram for malignant neoplasm of breast: Secondary | ICD-10-CM | POA: Diagnosis not present

## 2017-11-23 DIAGNOSIS — J449 Chronic obstructive pulmonary disease, unspecified: Secondary | ICD-10-CM | POA: Diagnosis not present

## 2017-11-23 DIAGNOSIS — C181 Malignant neoplasm of appendix: Secondary | ICD-10-CM | POA: Diagnosis not present

## 2017-11-23 DIAGNOSIS — D3A029 Benign carcinoid tumor of the large intestine, unspecified portion: Secondary | ICD-10-CM | POA: Diagnosis not present

## 2017-11-23 DIAGNOSIS — Z01818 Encounter for other preprocedural examination: Secondary | ICD-10-CM | POA: Diagnosis not present

## 2017-11-23 DIAGNOSIS — Z9049 Acquired absence of other specified parts of digestive tract: Secondary | ICD-10-CM | POA: Diagnosis not present

## 2017-11-29 ENCOUNTER — Encounter (HOSPITAL_COMMUNITY): Payer: Self-pay

## 2017-11-30 DIAGNOSIS — C3411 Malignant neoplasm of upper lobe, right bronchus or lung: Secondary | ICD-10-CM | POA: Diagnosis not present

## 2017-12-03 ENCOUNTER — Encounter (HOSPITAL_COMMUNITY): Payer: Self-pay

## 2017-12-07 DIAGNOSIS — Z87891 Personal history of nicotine dependence: Secondary | ICD-10-CM | POA: Diagnosis not present

## 2017-12-07 DIAGNOSIS — J449 Chronic obstructive pulmonary disease, unspecified: Secondary | ICD-10-CM | POA: Diagnosis present

## 2017-12-07 DIAGNOSIS — C3491 Malignant neoplasm of unspecified part of right bronchus or lung: Secondary | ICD-10-CM | POA: Diagnosis present

## 2017-12-07 DIAGNOSIS — R339 Retention of urine, unspecified: Secondary | ICD-10-CM | POA: Diagnosis present

## 2017-12-07 DIAGNOSIS — I700 Atherosclerosis of aorta: Secondary | ICD-10-CM | POA: Diagnosis present

## 2017-12-07 DIAGNOSIS — G8918 Other acute postprocedural pain: Secondary | ICD-10-CM | POA: Diagnosis not present

## 2017-12-07 DIAGNOSIS — Z9049 Acquired absence of other specified parts of digestive tract: Secondary | ICD-10-CM | POA: Diagnosis not present

## 2017-12-07 DIAGNOSIS — C772 Secondary and unspecified malignant neoplasm of intra-abdominal lymph nodes: Secondary | ICD-10-CM | POA: Diagnosis present

## 2017-12-07 DIAGNOSIS — C181 Malignant neoplasm of appendix: Secondary | ICD-10-CM | POA: Diagnosis not present

## 2017-12-07 DIAGNOSIS — R109 Unspecified abdominal pain: Secondary | ICD-10-CM | POA: Diagnosis not present

## 2017-12-07 HISTORY — PX: OTHER SURGICAL HISTORY: SHX169

## 2017-12-20 DIAGNOSIS — C3490 Malignant neoplasm of unspecified part of unspecified bronchus or lung: Secondary | ICD-10-CM | POA: Diagnosis not present

## 2017-12-20 DIAGNOSIS — Z09 Encounter for follow-up examination after completed treatment for conditions other than malignant neoplasm: Secondary | ICD-10-CM | POA: Diagnosis not present

## 2017-12-20 DIAGNOSIS — C181 Malignant neoplasm of appendix: Secondary | ICD-10-CM | POA: Diagnosis not present

## 2017-12-21 ENCOUNTER — Encounter: Payer: Self-pay | Admitting: Nurse Practitioner

## 2017-12-21 ENCOUNTER — Telehealth: Payer: Self-pay | Admitting: Nurse Practitioner

## 2017-12-21 NOTE — Telephone Encounter (Signed)
Appt has been scheduled for the pt to see Melissa Golden on 2/7 at 145pm. Aware to arrive 30 minutes early. Letter mailed.

## 2018-01-02 NOTE — Progress Notes (Signed)
  Oncology Nurse Navigator Documentation  Navigator Location: [facility] (01/02/18 1453) Referral date to RadOnc/MedOnc: 12/21/17 (01/02/18 1453) )Navigator Encounter Type: Introductory phone call (01/02/18 1453)                             Interventions: Education (01/02/18 1453)  Called and spoke with patient's husband to introduce myself and my role as GI Navigator.  Patient verbalized understanding of appointment time and location.    Education Method: Verbal (01/02/18 1453)      Acuity: Level 1 (01/02/18 1453)         Time Spent with Patient: 30 (01/02/18 1453)

## 2018-01-03 ENCOUNTER — Inpatient Hospital Stay: Payer: Medicare Other | Attending: Nurse Practitioner | Admitting: Nurse Practitioner

## 2018-01-03 ENCOUNTER — Telehealth: Payer: Self-pay | Admitting: Pharmacist

## 2018-01-03 ENCOUNTER — Encounter: Payer: Self-pay | Admitting: Nurse Practitioner

## 2018-01-03 ENCOUNTER — Inpatient Hospital Stay: Payer: Medicare Other

## 2018-01-03 VITALS — BP 108/54 | HR 86 | Temp 98.6°F | Resp 18 | Ht 62.0 in | Wt 124.2 lb

## 2018-01-03 DIAGNOSIS — C181 Malignant neoplasm of appendix: Secondary | ICD-10-CM | POA: Diagnosis not present

## 2018-01-03 DIAGNOSIS — J45909 Unspecified asthma, uncomplicated: Secondary | ICD-10-CM | POA: Insufficient documentation

## 2018-01-03 DIAGNOSIS — Z79899 Other long term (current) drug therapy: Secondary | ICD-10-CM | POA: Insufficient documentation

## 2018-01-03 DIAGNOSIS — R590 Localized enlarged lymph nodes: Secondary | ICD-10-CM | POA: Insufficient documentation

## 2018-01-03 DIAGNOSIS — I700 Atherosclerosis of aorta: Secondary | ICD-10-CM | POA: Insufficient documentation

## 2018-01-03 DIAGNOSIS — C3411 Malignant neoplasm of upper lobe, right bronchus or lung: Secondary | ICD-10-CM | POA: Insufficient documentation

## 2018-01-03 DIAGNOSIS — I2721 Secondary pulmonary arterial hypertension: Secondary | ICD-10-CM | POA: Insufficient documentation

## 2018-01-03 DIAGNOSIS — J449 Chronic obstructive pulmonary disease, unspecified: Secondary | ICD-10-CM | POA: Diagnosis not present

## 2018-01-03 LAB — CBC WITH DIFFERENTIAL (CANCER CENTER ONLY)
Basophils Absolute: 0 10*3/uL (ref 0.0–0.1)
Basophils Relative: 0 %
EOS ABS: 0.1 10*3/uL (ref 0.0–0.5)
EOS PCT: 1 %
HCT: 42.7 % (ref 34.8–46.6)
Hemoglobin: 14 g/dL (ref 11.6–15.9)
LYMPHS ABS: 1.3 10*3/uL (ref 0.9–3.3)
Lymphocytes Relative: 19 %
MCH: 31.7 pg (ref 25.1–34.0)
MCHC: 32.8 g/dL (ref 31.5–36.0)
MCV: 96.6 fL (ref 79.5–101.0)
MONOS PCT: 8 %
Monocytes Absolute: 0.6 10*3/uL (ref 0.1–0.9)
Neutro Abs: 5.1 10*3/uL (ref 1.5–6.5)
Neutrophils Relative %: 72 %
PLATELETS: 179 10*3/uL (ref 145–400)
RBC: 4.42 MIL/uL (ref 3.70–5.45)
RDW: 12.5 % (ref 11.2–14.5)
WBC Count: 7 10*3/uL (ref 3.9–10.3)
nRBC: 0 /100 WBC

## 2018-01-03 LAB — CMP (CANCER CENTER ONLY)
ALT: 12 U/L (ref 0–55)
AST: 19 U/L (ref 5–34)
Albumin: 4 g/dL (ref 3.5–5.0)
Alkaline Phosphatase: 77 U/L (ref 40–150)
Anion gap: 9 (ref 3–11)
BUN: 4 mg/dL — AB (ref 7–26)
CHLORIDE: 103 mmol/L (ref 98–109)
CO2: 29 mmol/L (ref 22–29)
CREATININE: 0.64 mg/dL (ref 0.60–1.10)
Calcium: 9.4 mg/dL (ref 8.4–10.4)
GFR, Est AFR Am: >60 mL/min (ref >60)
GFR, Estimated: >60 mL/min (ref >60)
GLUCOSE: 97 mg/dL (ref 70–140)
Potassium: 4 mmol/L (ref 3.5–5.1)
SODIUM: 141 mmol/L (ref 136–145)
Total Bilirubin: 0.5 mg/dL (ref 0.2–1.2)
Total Protein: 7.2 g/dL (ref 6.4–8.3)

## 2018-01-03 MED ORDER — CAPECITABINE 500 MG PO TABS: 1500.0000 mg | ORAL_TABLET | Freq: Two times a day (BID) | ORAL | 0 refills | Status: DC

## 2018-01-03 NOTE — Progress Notes (Signed)
  Oncology Nurse Navigator Documentation  Navigator Location: [facility] (01/03/18 1415)   )Navigator Encounter Type: Initial MedOnc (01/03/18 1415)   Abnormal Finding Date: 09/12/17 (01/03/18 1415) Confirmed Diagnosis Date: 10/12/17 (01/03/18 1415) Surgery Date: 10/12/17(10/12/17 Appendectomy  12/07/17 Right hemi colectomy) (01/03/18 1415)           Treatment Initiated Date: 12/07/17 (01/03/18 1415) Patient Visit Type: MedOnc;Initial (01/03/18 1415) Treatment Phase: Pre-Tx/Tx Discussion (01/03/18 1415) Barriers/Navigation Needs: No barriers at this time;No Questions;No Needs (01/03/18 1415)    Met with patient and husband during initial med-onc appointment. I explained my  Role as GI navigator. No barriers identified.            Acuity: Level 2 (01/03/18 1415)         Time Spent with Patient: 30 (01/03/18 1415)

## 2018-01-03 NOTE — Telephone Encounter (Signed)
Oral Chemotherapy Pharmacist Encounter   I spoke with patient and husband in exam room for overview of: Xeloda.   Counseled patient on administration, dosing, side effects, monitoring, drug-food interactions, safe handling, storage, and disposal.  Patient will take Xeloda 500mg  tablets, 3 tablets (1500mg ) by mouth in AM and 3 tabs (1500mg ) by mouth in PM, within 30 minutes of finishing meals, on days 1-14 of each 21 day cycle.  Xeloda start date: 01/07/18  Side effects of Xeloda include but not limited to: fatigue, decreased blood counts, GI upset, diarrhea, and hand-foot syndrome. Patient has loperamide at home and will call the office if diarrhea develops.    Reviewed with patient importance of keeping a medication schedule and plan for any missed doses.  Mr. And Mrs. Fawver voiced understanding and appreciation.   All questions answered. Medication reconciliation performed and medication/allergy list updated.  Xeloda prescription will be made ready for pick up at the [facility] 01/04/18 at 8am.  Oral Oncology Patient Advocate LVM for patient on 01/03/18 with information that Xeloda was ready at the pharmacy for copayment $0 on Medicare Part B benefits and Medicare supplement.  Labs from 01/03/18 reviewed, OK for treatment. Pt with advanced age and low wt. SCr=0.64, round to 1.0 for age > 70, est CrCl 45 mL/min  Patient knows to call the office with questions or concerns. Oral Oncology Clinic will continue to follow.  Thank you,  Johny Drilling, PharmD, BCPS, BCOP 01/03/2018   4:58 PM Oral Oncology Clinic [phone]

## 2018-01-03 NOTE — Progress Notes (Addendum)
We will present her case at the GI tumor conference new Hematology/Oncology Consult   Referral MD: Dr. 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  She also has non-small cell lung cancer status post CT biopsy of a right upper lobe pulmonary nodule 11/02/2017 with pathology showing non-small cell carcinoma, positive for TTF-1 and negative for p63.  She reports developing severe abdominal pain in October of last year.  She had a CT scan of the abdomen/pelvis on 09/12/2017 which showed a fluid-filled hypervascular appendix with mild reactive enteritis within the ileum.  She underwent a laparoscopic appendectomy on 09/12/2017.  Postoperative course complicated by an ileus requiring TPN.  Final pathology of the appendix showed a 5 cm adenocarcinoma/goblet cell carcinoid.  The tumor extended through the muscularis propria into subserosal soft tissue and mesoappendix and focally to the serosal surface.  Lymphovascular space invasion identified.  Perineural invasion identified.  Associated acute appendicitis with perforation.  On 12/07/2017 she underwent a laparoscopic right hemicolectomy by Dr. Cecil Cobbs at [facility].  No carcinomatosis or liver metastases were seen.  Final pathology showed metastatic adenocarcinoma/goblet cell carcinoid in 3 of 15 pericolic lymph nodes; maximum size of largest metastasis 0.2 cm; positive for extracapsular extension.  Benign terminal ileum with fibrous serosal adhesions, negative for dysplasia or carcinoma; surgically absent appendix; appendectomy site with postsurgical changes and fibrous serosal adhesions, negative for carcinoma; proximal and distal mucosal margins negative for dysplasia or carcinoma;  mesenteric resection margin negative for carcinoma.  With regard to the non-small cell lung cancer she underwent a PET scan on 10/17/2017.  Findings included a right upper lobe nodule measuring 9 mm, increased from 4 mm on previous PET-CT 25/42/7062, hypermetabolic with SUV max 7.1.  In the more superior right upper lobe a 4 mm nodule with associated metabolic activity with SUV max of 3.6.  Previously those 2 nodules had low metabolic activity less than blood pool.  There was a third lesion in the right upper lobe measuring 4 mm with faint metabolic activity.  There were no hypermetabolic mediastinal lymph nodes.  There was no clear evidence of malignancy in the abdomen or pelvis.  Mild activity noted along the ventral abdominal surgical scar.  Scattered activity in small bowel without focal lesion on CT. CT biopsy right upper lobe nodule 11/02/2017 showed non-small cell carcinoma; malignant cells positive for TTF-1 and negative for p63.  PD-L1 95%.   Past Medical History:  Diagnosis Date  . Asthma   . COPD (chronic obstructive pulmonary disease) ([place])   . Shortness of breath    with exertion  :  Past Surgical History:  Procedure Laterality Date  . ABDOMINAL HYSTERECTOMY    . CATARACT EXTRACTION W/ INTRAOCULAR LENS IMPLANT     Right  . CATARACT EXTRACTION W/PHACO  01/02/2013   Procedure: CATARACT EXTRACTION PHACO AND INTRAOCULAR LENS PLACEMENT (IOC);  Surgeon: Tonny Branch, MD;  Location: [facility];  Service: Ophthalmology;  Laterality: Left;  CDE: 16.19  :   Current Outpatient Medications:  .  albuterol (PROVENTIL HFA;VENTOLIN HFA) 108 (90 BASE) MCG/ACT inhaler, Inhale 2 puffs into the lungs every 6 (six) hours as needed. Shortness of Breath, Disp: , Rfl:  .  budesonide (PULMICORT) 180 MCG/ACT inhaler, Inhale 2 puffs into the lungs 2 (two) times daily., Disp: ,  Rfl:  .  ibuprofen (ADVIL,MOTRIN) 200 MG tablet, Take 400 mg by mouth every 6 (six) hours as needed. Pain, Disp: , Rfl:  .  Multiple  Vitamin (MULTIVITAMIN WITH MINERALS) TABS, Take 1 tablet by mouth daily., Disp: , Rfl: :  :  No Known Allergies  FH: Mother deceased with lung cancer; sister deceased with colon cancer at age 14.  SOCIAL HISTORY: Melissa Golden lives in [place].  She is married.  She has 3 children living.  One daughter recently deceased.  She is retired from truck driving most recently.  She quit smoking 15 months ago at 2 packs/day since the age of 46.  She occasionally drinks alcohol.  Review of Systems: She overall is feeling well.  She reports a good appetite.  Her weight is stable.  No fevers or sweats.  She denies pain.  No bleeding.  She does note that she bruises easily.  No unusual headaches.  No visual disturbance.  She denies dysphagia.  No nausea or vomiting.  No shortness of breath.  She utilizes supplemental oxygen at 3 L/min.  Bowels moving regularly.  No numbness or tingling in her hands or feet.  No urinary symptoms.  Physical Exam:  Blood pressure (!) 108/54, pulse 86, temperature 98.6 F (37 C), temperature source Oral, resp. rate 18, height '5\' 2"'$  (1.575 m), weight 124 lb 3.2 oz (56.3 kg), SpO2 97 %.  HEENT: Sclera anicteric.  Oropharynx is without thrush or ulceration. Lungs: Distant breath sounds.  No respiratory distress. Cardiac: Regular with occasional premature beat. Abdomen: Soft and nontender.  Healed surgical incisions.  No hepatomegaly.  No apparent ascites.  Vascular: No leg edema.  Calf soft and nontender. Lymph nodes: No palpable cervical, supraclavicular, axillary or inguinal lymph nodes. Neurologic: Alert and oriented.  Follows commands.  Gait normal. Skin: No rash.  LABS:  Pending.  RADIOLOGY:  No results found.  Assessment and Plan:   1. Adenocarcinoma/goblet cell carcinoid of the appendix  CT scan of the abdomen/pelvis on 09/12/2017 - fluid-filled hypervascular appendix with mild reactive enteritis within the ileum.    Status post laparoscopic appendectomy  09/12/2017.  Postoperative course complicated by an ileus requiring TPN.    Final pathology of the appendix-5 cm adenocarcinoma/goblet cell carcinoid.  The tumor extended through the muscularis propria into subserosal soft tissue and mesoappendix and focally to the serosal surface.  Lymphovascular space invasion identified.  Perineural invasion identified.  Associated acute appendicitis with perforation.  10/15/2017 CEA 1.8  11/22/2017 chromogranin A-3  Status post laparoscopic right hemicolectomy by Dr. Cecil Cobbs at [facility] on 12/07/2017.  No carcinomatosis or liver metastases were seen.    Final pathology showed metastatic adenocarcinoma/goblet cell carcinoid in 3 of 15 pericolic lymph nodes; maximum size of largest metastasis 0.2 cm; positive for extracapsular extension.  Benign terminal ileum with fibrous serosal adhesions, negative for dysplasia or carcinoma; surgically absent appendix; appendectomy site with postsurgical changes and fibrous serosal adhesions, negative for carcinoma; proximal and distal mucosal margins negative for dysplasia or carcinoma; mesenteric resection margin negative for carcinoma. 2. Non-small cell lung cancer  PET scan 63/89/3734-2 hypermetabolic right upper lobe pulmonary nodules increased in size and metabolic activity.  No mediastinal nodal metastasis.  4 mm right upper lobe nodule with metabolic activity, SUV 3.6.  A third right upper lobe nodule measures 4 mm and has faint metabolic activity.  No clear evidence of malignancy in the abdomen or pelvis.  Mild activity along the ventral abdominal surgical scar.  Scattered activity in  small bowel without focal lesion on CT.  CT biopsy right upper lobe nodule 11/02/2017 showed non-small cell carcinoma; malignant cells positive for TTF-1 and negative for p63.  PD-L1 95%. 3. 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  Dr. Benay Spice reviewed the  diagnosis, prognosis and treatment options with her and her husband.  They understand Dr. Benay Spice recommends a 70-monthcourse of adjuvant chemotherapy in her case.  We discussed the FOLFOX regimen and Xeloda.  We reviewed potential toxicities associated with both.  She is most comfortable proceeding with Xeloda.  We specifically discussed the potential for nausea, mouth sores, diarrhea, hand-foot syndrome, skin rash, increased sensitivity to sun, conjunctivitis with Xeloda.  She agrees to proceed.  She will attend a chemotherapy education class.  [place].  We will obtain baseline labs today to include a CBC and chemistry panel.  We anticipate a start date for cycle 1 of 01/07/2018.    She has also been diagnosed with non-small cell lung cancer.  PET scan on 10/17/2017 showed 3 hypermetabolic right upper lobe lung nodules.  Biopsy of 1 of the nodules confirmed non-small cell carcinoma consistent with primary lung adenocarcinoma.  We are making a referral to Dr. MLisbeth Renshawto consider SBRT.  She will return for a follow-up visit on 01/22/2018.  She will contact the office in the interim with any problems.  Patient seen with Dr. SBenay Spice  LNed Card NP 01/03/2018, 1:20 PM   This was a shared visit with LNed Card  Ms. DLoosewas interviewed and examined.  She has been diagnosed with synchronous non-small cell lung cancer and goblet cell/adenocarcinoma of the appendix.  We discussed treatment options for both malignancies.  She is not a candidate for lung resection secondary to COPD.  We will make a referral to Dr. MLisbeth Renshawto consider definitive SBRT for treatment of the lung tumor.  We will present her case at the GI tumor conference to discuss management of the dominant and smaller lung nodules.  We discussed the distinction between carcinoid tumors and adenocarcinoma of the appendix.  Goblet cell tumors have behavior intermediate between the 2.  She has  a significant chance of developing recurrent disease over the next few years based on the tumor histology, lymphovascular invasion, and positive lymph nodes.  I explained there is no standard adjuvant systemic therapy for patients with resected goblet cell tumors.  However we generally extrapolate from the colorectal cancer literature and recommend adjuvant 5-fluorouracil based therapy in patients with resected node positive disease.  We discussed FOLFOX and single agent capecitabine.  I described the expected benefits from the 5-fluorouracil and oxaliplatin.  She is most comfortable with single agent capecitabine.  We reviewed the potential toxicities associated with capecitabine and she met with the Cancer center pharmacist today.  She will attended chemotherapy teaching class.  The plan is to begin capecitabine 01/07/2018.  She has a family history of colon cancer.  We will request MSI testing on the resected appendiceal tumor.  50 minutes were spent with the patient today.  The majority of the time was used for counseling and coordination of care.  BJulieanne Manson MD

## 2018-01-04 ENCOUNTER — Encounter: Payer: Self-pay | Admitting: Radiation Oncology

## 2018-01-04 ENCOUNTER — Inpatient Hospital Stay: Payer: Medicare Other

## 2018-01-04 MED FILL — CAPECITABINE 500 MG TABLET: 500 | 21 days supply | Qty: 84 | Fill #0

## 2018-01-04 NOTE — Progress Notes (Addendum)
Thoracic Location of Tumor / Histology: Right upper lobe of lung 11/02/17 Diagnosis Lung, biopsy, Right upper lobe - NON-SMALL CELL CARCINOMA  Patient presented with symptoms of: She had a PET scan on 10/17/17 which demonstrated the right upper lobe lung nodules.   Biopsies of Right upper lobe of lung revealed: Non-small cell carcinoma.   Tobacco/Marijuana/Snuff/ETOH use: She is a former smoker quitting 10/2016. She drinks about 6 beers weekly.   Past/Anticipated interventions by cardiothoracic surgery, if any: 02-2017 Saw Dr. Cyndia Golden  told she was not a surgical candidate  Past/Anticipated interventions by medical oncology, if any:  Dr. 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  Dr. Benay Golden reviewed the diagnosis, prognosis and treatment options with her and her husband.  They understand Dr. Benay Golden recommends a 40-monthcourse of adjuvant chemotherapy in her case.  We discussed the FOLFOX regimen and Xeloda.  We reviewed potential toxicities associated with both.  She is most comfortable proceeding with Xeloda.  We specifically discussed the potential for nausea, mouth sores, diarrhea, hand-foot syndrome, skin rash, increased sensitivity to sun, conjunctivitis with Xeloda.  She agrees to proceed.  She will attend a chemotherapy education class.  [place].  We will obtain baseline labs today to include a CBC and chemistry panel.  We anticipate a start date for cycle 1 of 01/07/2018.    She has also been diagnosed with non-small cell lung cancer.  PET scan on 10/17/2017 showed 3 hypermetabolic right upper lobe lung nodules.  Biopsy of 1 of the nodules confirmed non-small cell carcinoma consistent with primary lung adenocarcinoma.  We are making a referral to Dr. MLisbeth Renshawto consider SBRT.   Signs/Symptoms Weight changes, if any:  Wt Readings from Last 3  Encounters:  01/10/18 126 lb 6.4 oz (57.3 kg)  01/03/18 124 lb 3.2 oz (56.3 kg)  11/02/17 111 lb (50.3 kg)     Respiratory complaints, if any: SOB most of the time, dry cough and sneezing for the past three days  Hemoptysis, if any: No  Pain issues, if any: No  SAFETY ISSUES:No  Prior radiation? No   Pacemaker/ICD? No  Possible current pregnancy? No  Is the patient on methotrexate? No  Current Complaints / other details: BP (!) 152/61 (BP Location: Left Arm, Patient Position: Sitting, Cuff Size: Normal)   Pulse 62   Temp 98.5 F (36.9 C) (Oral)   Resp 18   Ht '5\' 2"'$  (1.575 m)   Wt 126 lb 6.4 oz (57.3 kg)   SpO2 100% Comment: o2 3l/min continous  BMI 23.12 kg/m

## 2018-01-09 ENCOUNTER — Other Ambulatory Visit: Payer: Self-pay

## 2018-01-10 ENCOUNTER — Encounter: Payer: Self-pay | Admitting: Radiation Oncology

## 2018-01-10 ENCOUNTER — Other Ambulatory Visit: Payer: Self-pay

## 2018-01-10 ENCOUNTER — Ambulatory Visit
Admission: RE | Admit: 2018-01-10 | Discharge: 2018-01-10 | Disposition: A | Discharge status: Home / Self Care | Payer: Medicare Other | Source: Ambulatory Visit | Attending: Radiation Oncology | Admitting: Radiation Oncology

## 2018-01-10 DIAGNOSIS — C3411 Malignant neoplasm of upper lobe, right bronchus or lung: Secondary | ICD-10-CM

## 2018-01-10 DIAGNOSIS — Z8719 Personal history of other diseases of the digestive system: Secondary | ICD-10-CM | POA: Insufficient documentation

## 2018-01-10 DIAGNOSIS — Z9049 Acquired absence of other specified parts of digestive tract: Secondary | ICD-10-CM | POA: Diagnosis not present

## 2018-01-10 DIAGNOSIS — Z79899 Other long term (current) drug therapy: Secondary | ICD-10-CM | POA: Diagnosis not present

## 2018-01-10 DIAGNOSIS — R0602 Shortness of breath: Secondary | ICD-10-CM | POA: Diagnosis not present

## 2018-01-10 DIAGNOSIS — Z809 Family history of malignant neoplasm, unspecified: Secondary | ICD-10-CM | POA: Insufficient documentation

## 2018-01-10 DIAGNOSIS — Z90710 Acquired absence of both cervix and uterus: Secondary | ICD-10-CM | POA: Insufficient documentation

## 2018-01-10 DIAGNOSIS — J449 Chronic obstructive pulmonary disease, unspecified: Secondary | ICD-10-CM | POA: Diagnosis not present

## 2018-01-10 DIAGNOSIS — Z87891 Personal history of nicotine dependence: Secondary | ICD-10-CM | POA: Diagnosis not present

## 2018-01-10 DIAGNOSIS — Z800 Family history of malignant neoplasm of digestive organs: Secondary | ICD-10-CM | POA: Diagnosis not present

## 2018-01-10 DIAGNOSIS — C181 Malignant neoplasm of appendix: Secondary | ICD-10-CM | POA: Insufficient documentation

## 2018-01-10 HISTORY — DX: Ileus, unspecified: K56.7

## 2018-01-10 HISTORY — DX: Other postprocedural complications and disorders of digestive system: K91.89

## 2018-01-10 NOTE — Progress Notes (Signed)
Radiation Oncology         [phone] ________________________________  Name: Melissa Golden        MRN: [id number]  Date of Service: 01/10/2018 DOB: [birth year]  CC:Rory Percy, MD  Ladell Pier, MD     REFERRING PHYSICIAN: Ladell Pier, MD    DIAGNOSIS: The encounter diagnosis was Malignant neoplasm of right upper lobe of lung ([place]).   HISTORY OF PRESENT ILLNESS: Melissa Golden is a 72 y.o. female seen at the request of Dr. Benay Spice for a recently diagnosed multifocal early stage lung cancer. 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 She went on to have hemicolectomy in January 2019 at [facility]. She was found in the midst of her work up to have several right upper lobe pulmonary nodules which were hypermetabolic on PET scan on 02/40/97, measured 9 mm, 4 mm, and a third that was 4 mm. No adenopathy in the chest was noted. She also underwent bronchoscopy and biopsy of one of these on 11/02/17 revealed adenocarcinoma. She has recently started Xeloda with Dr. Benay Spice and comes today to discuss options of stereotactic radiotherapy to these sites.    PREVIOUS RADIATION THERAPY: No   PAST MEDICAL HISTORY:  Past Medical History:  Diagnosis Date  . Asthma   . COPD (chronic obstructive pulmonary disease) ([place])   . Ileus following gastrointestinal surgery 09/12/2017   required TPN support  . Shortness of breath    with exertion       PAST SURGICAL HISTORY: Past Surgical History:  Procedure Laterality Date  . ABDOMINAL HYSTERECTOMY    . CATARACT EXTRACTION W/ INTRAOCULAR LENS IMPLANT     Right  . CATARACT EXTRACTION W/PHACO  01/02/2013   Procedure: CATARACT EXTRACTION PHACO AND INTRAOCULAR LENS PLACEMENT (IOC);  Surgeon: Tonny Branch, MD;  Location: [facility];  Service: Ophthalmology;  Laterality: Left;  CDE: 16.19  . LAPAROSCOPIC APPENDECTOMY  09/12/2017  . laparoscopic  hemicolectomy Right 12/07/2017   DR. Stitzenberg and [facility]     FAMILY HISTORY:  Family History  Problem Relation Age of Onset  . Cancer Mother   . Heart attack Father   . Cancer - Colon Sister      SOCIAL HISTORY:  reports that she quit smoking about 14 months ago. Her smoking use included cigarettes. She has a 0.25 pack-year smoking history. she has never used smokeless tobacco. She reports that she drinks about 3.6 oz of alcohol per week. She reports that she does not use drugs.   ALLERGIES: Patient has no known allergies.   MEDICATIONS:  Current Outpatient Medications  Medication Sig Dispense Refill  . albuterol (PROVENTIL HFA;VENTOLIN HFA) 108 (90 BASE) MCG/ACT inhaler Inhale 2 puffs into the lungs every 6 (six) hours as needed. Shortness of Breath    . budesonide (PULMICORT) 180 MCG/ACT inhaler Inhale 2 puffs into the lungs 2 (two) times daily.    . capecitabine (XELODA) 500 MG tablet Take 3 tablets (1,500 mg total) by mouth 2 (two) times daily after a meal. Take for 14 days on, 7 days off, repeat every 21 days 84 tablet 0  . ibuprofen (ADVIL,MOTRIN) 200 MG tablet Take 400 mg by mouth every 6 (six) hours as needed. Pain    . Multiple Vitamin (MULTIVITAMIN WITH MINERALS) TABS Take 1 tablet by mouth daily.     No current facility-administered medications for this encounter.  REVIEW OF SYSTEMS: On review of systems, the patient reports that she is doing well overall. She denies any chest pain, shortness of breath, cough, fevers, chills, night sweats, unintended weight changes. She denies any bowel or bladder disturbances, and denies abdominal pain, nausea or vomiting. She denies any new musculoskeletal or joint aches or pains. A complete review of systems is obtained and is otherwise negative.     PHYSICAL EXAM:  Wt Readings from Last 3 Encounters:  01/10/18 126 lb 6.4 oz (57.3 kg)  01/03/18 124 lb 3.2 oz (56.3 kg)  11/02/17 111 lb (50.3 kg)   Temp Readings  from Last 3 Encounters:  01/10/18 98.5 F (36.9 C) (Oral)  01/03/18 98.6 F (37 C) (Oral)  11/02/17 98 F (36.7 C)   BP Readings from Last 3 Encounters:  01/10/18 (!) 152/61  01/03/18 (!) 108/54  11/02/17 (!) 165/64   Pulse Readings from Last 3 Encounters:  01/10/18 62  01/03/18 86  11/02/17 85    In general this is a well appearing caucasian female in no acute distress. She is alert and oriented x4 and appropriate throughout the examination. HEENT reveals that the patient is normocephalic, atraumatic. EOMs are intact. PERRLA. Skin is intact without any evidence of gross lesions. Cardiovascular exam reveals a regular rate and rhythm, no clicks rubs or murmurs are auscultated. Chest is clear to auscultation bilaterally. Lymphatic assessment is performed and does not reveal any adenopathy in the cervical, supraclavicular, axillary, or inguinal chains. Abdomen has active bowel sounds in all quadrants and is intact. The abdomen is soft, non tender, non distended. Lower extremities are negative for pretibial pitting edema, deep calf tenderness, cyanosis or clubbing.   ECOG = 1  0 - Asymptomatic (Fully active, able to carry on all predisease activities without restriction)  1 - Symptomatic but completely ambulatory (Restricted in physically strenuous activity but ambulatory and able to carry out work of a light or sedentary nature. For example, light housework, office work)  2 - Symptomatic, <50% in bed during the day (Ambulatory and capable of all self care but unable to carry out any work activities. Up and about more than 50% of waking hours)  3 - Symptomatic, >50% in bed, but not bedbound (Capable of only limited self-care, confined to bed or chair 50% or more of waking hours)  4 - Bedbound (Completely disabled. Cannot carry on any self-care. Totally confined to bed or chair)  5 - Death   Eustace Pen MM, Creech RH, Tormey DC, et al. [phone]). "Toxicity and response criteria of the [facility]". [place] Oncol. 5 (6): 649-55    LABORATORY DATA:  Lab Results  Component Value Date   WBC 7.0 01/03/2018   HGB 13.2 11/02/2017   HCT 42.7 01/03/2018   MCV 96.6 01/03/2018   PLT 179 01/03/2018   Lab Results  Component Value Date   NA 141 01/03/2018   K 4.0 01/03/2018   CL 103 01/03/2018   CO2 29 01/03/2018   Lab Results  Component Value Date   ALT 12 01/03/2018   AST 19 01/03/2018   ALKPHOS 77 01/03/2018   BILITOT 0.5 01/03/2018      RADIOGRAPHY: No results found.     IMPRESSION/PLAN: 1. Multifocal Stage IA, [id number] NSCLC, of the RUL. Dr. Lisbeth Renshaw discusses the pathology findings and reviews the nature of early stage lung cancer. Prior to proceeding with simulation, Dr. Lisbeth Renshaw recommends confirming the findings on CT scan, and to rule out progressive  changes since her last scan was 3 months ago. He does believe that based on the data collected thus far that she would be a candidate for stereotactic radiotherapy (SBRT). We discussed the risks, benefits, short, and long term effects of radiotherapy, and the patient is interested in proceeding. Dr. Lisbeth Renshaw discusses the delivery and logistics of radiotherapy and anticipates a course of 3-5 fractions. The patient is interested in proceeding and is also in agreement with repeat CT chest to confirm that her disease is still localized to the RUL. 2. 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 The patient will continue her Xeloda and her follow up will continue with Dr. Benay Spice.   The above documentation reflects my direct findings during this shared patient visit. Please see the separate note by Dr. Lisbeth Renshaw on this date for the remainder of the patient's plan of care.    Carola Rhine, PAC

## 2018-01-16 ENCOUNTER — Telehealth: Payer: Self-pay | Admitting: *Deleted

## 2018-01-16 NOTE — Telephone Encounter (Signed)
CALLED PATIENT TO INFORM OF CT FOR 01/21/18 - ARRIVAL TIME - 8:15 AM @ [facility], PT. TO HAVE WATER ONLY 4 HRS. PRIOR TO TEST, SPOKE WITH PATIENT'S HUSBAND DAVID AND HE IS AWARE OF THIS TEST

## 2018-01-21 ENCOUNTER — Other Ambulatory Visit: Payer: Self-pay | Admitting: Oncology

## 2018-01-21 ENCOUNTER — Encounter (HOSPITAL_COMMUNITY): Payer: Self-pay

## 2018-01-21 ENCOUNTER — Ambulatory Visit (HOSPITAL_COMMUNITY)
Admission: RE | Admit: 2018-01-21 | Discharge: 2018-01-21 | Disposition: A | Discharge status: Home / Self Care | Payer: Medicare Other | Source: Ambulatory Visit | Attending: Radiation Oncology | Admitting: Radiation Oncology

## 2018-01-21 DIAGNOSIS — C181 Malignant neoplasm of appendix: Secondary | ICD-10-CM

## 2018-01-21 DIAGNOSIS — C3490 Malignant neoplasm of unspecified part of unspecified bronchus or lung: Secondary | ICD-10-CM | POA: Diagnosis not present

## 2018-01-21 DIAGNOSIS — I700 Atherosclerosis of aorta: Secondary | ICD-10-CM | POA: Diagnosis not present

## 2018-01-21 DIAGNOSIS — I2721 Secondary pulmonary arterial hypertension: Secondary | ICD-10-CM | POA: Insufficient documentation

## 2018-01-21 DIAGNOSIS — C3411 Malignant neoplasm of upper lobe, right bronchus or lung: Secondary | ICD-10-CM | POA: Insufficient documentation

## 2018-01-21 DIAGNOSIS — I2510 Atherosclerotic heart disease of native coronary artery without angina pectoris: Secondary | ICD-10-CM | POA: Insufficient documentation

## 2018-01-21 MED ORDER — IOPAMIDOL (ISOVUE-300) INJECTION 61%
75.0000 mL | Freq: Once | INTRAVENOUS | Status: AC | PRN
  Administered 2018-01-21: 75 mL via INTRAVENOUS

## 2018-01-21 MED ORDER — IOPAMIDOL (ISOVUE-300) INJECTION 61%
INTRAVENOUS | Status: AC
  Administered 2018-01-21: 75 mL via INTRAVENOUS
  Filled 2018-01-21: qty 75

## 2018-01-22 ENCOUNTER — Inpatient Hospital Stay (HOSPITAL_BASED_OUTPATIENT_CLINIC_OR_DEPARTMENT_OTHER): Payer: Medicare Other | Admitting: Nurse Practitioner

## 2018-01-22 ENCOUNTER — Telehealth: Payer: Self-pay | Admitting: Oncology

## 2018-01-22 ENCOUNTER — Inpatient Hospital Stay: Payer: Medicare Other

## 2018-01-22 ENCOUNTER — Encounter: Payer: Self-pay | Admitting: Nurse Practitioner

## 2018-01-22 ENCOUNTER — Encounter: Payer: Self-pay | Admitting: *Deleted

## 2018-01-22 VITALS — BP 145/69 | HR 64 | Temp 98.4°F | Resp 17 | Ht 62.0 in | Wt 127.6 lb

## 2018-01-22 DIAGNOSIS — I2721 Secondary pulmonary arterial hypertension: Secondary | ICD-10-CM | POA: Diagnosis not present

## 2018-01-22 DIAGNOSIS — C181 Malignant neoplasm of appendix: Secondary | ICD-10-CM | POA: Diagnosis not present

## 2018-01-22 DIAGNOSIS — C3411 Malignant neoplasm of upper lobe, right bronchus or lung: Secondary | ICD-10-CM | POA: Diagnosis not present

## 2018-01-22 DIAGNOSIS — Z79899 Other long term (current) drug therapy: Secondary | ICD-10-CM

## 2018-01-22 DIAGNOSIS — R590 Localized enlarged lymph nodes: Secondary | ICD-10-CM

## 2018-01-22 DIAGNOSIS — J45909 Unspecified asthma, uncomplicated: Secondary | ICD-10-CM | POA: Diagnosis not present

## 2018-01-22 DIAGNOSIS — J449 Chronic obstructive pulmonary disease, unspecified: Secondary | ICD-10-CM | POA: Diagnosis not present

## 2018-01-22 LAB — CBC WITH DIFFERENTIAL (CANCER CENTER ONLY)
BASOS ABS: 0 10*3/uL (ref 0.0–0.1)
Basophils Relative: 0 %
Eosinophils Absolute: 0.1 10*3/uL (ref 0.0–0.5)
Eosinophils Relative: 1 %
HEMATOCRIT: 41.1 % (ref 34.8–46.6)
Hemoglobin: 13.5 g/dL (ref 11.6–15.9)
LYMPHS ABS: 1.4 10*3/uL (ref 0.9–3.3)
LYMPHS PCT: 25 %
MCH: 32 pg (ref 25.1–34.0)
MCHC: 32.8 g/dL (ref 31.5–36.0)
MCV: 97.4 fL (ref 79.5–101.0)
Monocytes Absolute: 0.5 10*3/uL (ref 0.1–0.9)
Monocytes Relative: 8 %
NEUTROS ABS: 3.8 10*3/uL (ref 1.5–6.5)
Neutrophils Relative %: 66 %
Platelet Count: 169 10*3/uL (ref 145–400)
RBC: 4.22 MIL/uL (ref 3.70–5.45)
RDW: 12.8 % (ref 11.2–14.5)
WBC: 5.7 10*3/uL (ref 3.9–10.3)

## 2018-01-22 LAB — CMP (CANCER CENTER ONLY)
ALBUMIN: 3.8 g/dL (ref 3.5–5.0)
ALT: 12 U/L (ref 0–55)
ANION GAP: 11 (ref 3–11)
AST: 16 U/L (ref 5–34)
Alkaline Phosphatase: 72 U/L (ref 40–150)
BILIRUBIN TOTAL: 0.5 mg/dL (ref 0.2–1.2)
BUN: 9 mg/dL (ref 7–26)
CHLORIDE: 101 mmol/L (ref 98–109)
CO2: 30 mmol/L — ABNORMAL HIGH (ref 22–29)
Calcium: 9.4 mg/dL (ref 8.4–10.4)
Creatinine: 0.66 mg/dL (ref 0.60–1.10)
GFR, Est AFR Am: >60 mL/min (ref >60)
GLUCOSE: 93 mg/dL (ref 70–140)
POTASSIUM: 4.3 mmol/L (ref 3.5–5.1)
Sodium: 142 mmol/L (ref 136–145)
TOTAL PROTEIN: 6.9 g/dL (ref 6.4–8.3)

## 2018-01-22 MED FILL — CAPECITABINE 500 MG TABLET: 500 | 21 days supply | Qty: 84 | Fill #0

## 2018-01-22 NOTE — Progress Notes (Addendum)
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  She completed cycle 1 adjuvant Xeloda beginning 01/07/2018.  She denies nausea/vomiting.  No mouth sores.  No diarrhea.  No hand or foot pain or redness.  She feels she tolerated the first cycle of chemotherapy well.  Objective:  Vital signs in last 24 hours:  Blood pressure (!) 145/69, pulse 64, temperature 98.4 F (36.9 C), temperature source Oral, resp. rate 17, height _0  (1.575 m), weight 127 lb 9.6 oz (57.9 kg), SpO2 95 %.    HEENT: No thrush or ulcers. Resp: Lungs clear bilaterally. Cardio: Regular rate and rhythm. GI: Abdomen soft and nontender.  No hepatomegaly.  Healed surgical incisions. Vascular: No leg edema.  Calves soft and nontender. Skin: Palms with mild erythema.  No skin breakdown.   Lab Results:  Lab Results  Component Value Date   WBC 5.7 01/22/2018   HGB 13.2 11/02/2017   HCT 41.1 01/22/2018   MCV 97.4 01/22/2018   PLT 169 01/22/2018   NEUTROABS 3.8 01/22/2018    Imaging:  Ct Chest W Contrast  Result Date: 01/21/2018 CLINICAL DATA:  Lung cancer. EXAM: CT CHEST WITH CONTRAST TECHNIQUE: Multidetector CT imaging of the chest was performed during intravenous contrast administration. CONTRAST:  75 cc Isovue-300. COMPARISON:  PET 12/17/2016 and CT chest 10/03/2017. FINDINGS: Cardiovascular: Atherosclerotic calcification of the arterial vasculature including coronary arteries. Pulmonary arteries are enlarged. Left ventricle appears slightly dilated. Heart size is normal. No pericardial effusion. Mediastinum/Nodes: New mediastinal adenopathy is seen in the prevascular and right paratracheal stations. Largest lymph node is seen in the prevascular space, measuring 2.0 cm in short axis, previously 6 mm. No hilar or axillary adenopathy. Esophagus is grossly  unremarkable. Lungs/Pleura: Biapical pleural-parenchymal scarring. Centrilobular emphysema. Apical segment right upper lobe nodule measures 6 mm, stable. Spiculated nodule along the minor fissure (image 60) measures 8 mm, stable. Additional scattered tiny pulmonary nodules are unchanged as well. Scarring in the lingula and right middle lobe. No pleural fluid. Airway is unremarkable. Upper Abdomen: 9 mm hyperattenuating lesion in the posterior right hepatic lobe may represent a flash fill hemangioma or perfusion anomaly. Definitive characterization is limited due to size and lack of precontrast imaging. Visualized portions of the liver, gallbladder, adrenal glands, kidneys, spleen, pancreas, stomach and bowel are grossly unremarkable. No upper abdominal adenopathy. Musculoskeletal: No worrisome lytic or sclerotic lesions. Degenerative changes in the spine. IMPRESSION: 1. New mediastinal adenopathy, indicative of disease progression. 2. Right lung nodules are stable. 3.  Aortic atherosclerosis (ICD10-170.0).  Coronary calcification. 4. Enlarged pulmonary arteries, indicative pulmonary arterial hypertension. Electronically Signed   By: Lorin Picket M.D.   On: 01/21/2018 09:09    Medications: I have reviewed the patient's current medications.  Assessment/Plan: 1. Adenocarcinoma/goblet cell carcinoid of the appendix  CT scan of the abdomen/pelvis on 09/12/2017 - fluid-filled hypervascular appendix with mild reactive enteritis within the ileum.    Status post laparoscopic appendectomy 09/12/2017.  Postoperative course complicated by an ileus requiring TPN.    Final pathology of the appendix-5 cm adenocarcinoma/goblet cell carcinoid.  The tumor extended through the muscularis propria into subserosal soft tissue and mesoappendix and focally to the serosal surface.  Lymphovascular space invasion identified.  Perineural invasion identified.  Associated acute appendicitis with perforation.  10/15/2017 CEA  1.8  11/22/2017 chromogranin A-3  Status post laparoscopic right hemicolectomy by Dr. Cecil Cobbs  at [facility] on 12/07/2017.  No carcinomatosis or liver metastases were seen.    Final pathology showed metastatic adenocarcinoma/goblet cell carcinoid in 3 of 15 pericolic lymph nodes; maximum size of largest metastasis 0.2 cm; positive for extracapsular extension.  Benign terminal ileum with fibrous serosal adhesions, negative for dysplasia or carcinoma; surgically absent appendix; appendectomy site with postsurgical changes and fibrous serosal adhesions, negative for carcinoma; proximal and distal mucosal margins negative for dysplasia or carcinoma; mesenteric resection margin negative for carcinoma.  Cycle 1 adjuvant Xeloda 01/07/2018  Cycle 2 adjuvant Xeloda 01/28/2018 2. Non-small cell lung cancer  PET scan 27/78/2423-5 hypermetabolic right upper lobe pulmonary nodules increased in size and metabolic activity.  No mediastinal nodal metastasis.  4 mm right upper lobe nodule with metabolic activity, SUV 3.6.  A third right upper lobe nodule measures 4 mm and has faint metabolic activity.  No clear evidence of malignancy in the abdomen or pelvis.  Mild activity along the ventral abdominal surgical scar.  Scattered activity in small bowel without focal lesion on CT.  CT biopsy right upper lobe nodule 11/02/2017 showed non-small cell carcinoma; malignant cells positive for TTF-1 and negative for p63.  PD-L1 95%.  Chest CT 01/21/2018-new mediastinal adenopathy seen in the prevascular and right paratracheal stations.  Largest lymph node is seen in the prevascular space measuring 2.0 cm, previously 6 mm.  No hilar or axillary adenopathy.  Right lung nodules are stable. 3. COPD   Disposition: Melissa Golden appears stable.  She has completed 1 cycle of adjuvant Xeloda.  She seems to have tolerated the first cycle well.  We reviewed today's labs.  Plan to proceed with cycle 2 as scheduled beginning 01/28/2018.  The  chest CT done 01/21/2018 shows new mediastinal adenopathy.  Dr. Benay Spice reviewed the CT report/images with Melissa Golden and her husband at today's visit and recommends proceeding with a biopsy.  We spoke with the [facility] thoracic navigator.  She will help to coordinate this through Dr. Vivi Martens office.  She will return for labs and a follow-up visit in 3 weeks.  She will contact the office in the interim with any problems.  Patient seen with Dr. Benay Spice.  25 minutes were spent face-to-face at today's visit with the majority of that time involved in counseling/coordination of care.   Ned Card ANP/GNP-BC   01/22/2018  2:17 PM This was a shared visit with Ned Card. Melissa Golden tolerated the first cycle of Xeloda well.  The new chest CT reveals mediastinal adenopathy.  We reviewed the CT images with her.  Her case was presented at the GI tumor conference earlier today.  The mediastinal lymphadenopathy is most likely related to metastatic non-small cell lung cancer.  We will make a referral to Dr. Cyndia Bent to see whether the precarinal node can be biopsied bronchoscopically.  She will proceed with cycle 2 adjuvant Xeloda for treatment of the goblet cell carcinoid tumor.  Julieanne Manson, MD

## 2018-01-22 NOTE — Telephone Encounter (Signed)
Appointment scheduled AVS/Calendar printed per 2/26 los

## 2018-01-22 NOTE — Progress Notes (Signed)
Oncology Nurse Navigator Documentation  Oncology Nurse Navigator Flowsheets 01/22/2018  Navigator Location [facility]  Navigator Encounter Type Other/per Ned Card NP, I contacted [facility] with an update on recent CT scan and to get her into see Dr. Cyndia Bent. I left vm message with Thurmond Butts at [facility] to call me with an update for next steps.   Patient Visit Type MedOnc  Barriers/Navigation Needs Coordination of Care  Interventions Coordination of Care  Coordination of Care Other  Acuity Level 2  Time Spent with Patient 15

## 2018-01-30 ENCOUNTER — Ambulatory Visit (INDEPENDENT_AMBULATORY_CARE_PROVIDER_SITE_OTHER): Payer: Medicare Other | Admitting: Surgery

## 2018-01-30 ENCOUNTER — Other Ambulatory Visit: Payer: Self-pay

## 2018-01-30 ENCOUNTER — Encounter: Payer: Self-pay | Admitting: Surgery

## 2018-01-30 ENCOUNTER — Other Ambulatory Visit: Payer: Self-pay | Admitting: *Deleted

## 2018-01-30 VITALS — BP 160/80 | HR 80 | Resp 18 | Ht 62.0 in | Wt 128.4 lb

## 2018-01-30 DIAGNOSIS — C3411 Malignant neoplasm of upper lobe, right bronchus or lung: Secondary | ICD-10-CM

## 2018-01-30 DIAGNOSIS — R590 Localized enlarged lymph nodes: Secondary | ICD-10-CM

## 2018-01-30 NOTE — Progress Notes (Signed)
HPI:  The patient returns today for evaluation of new mediastinal lymphadenopathy. She is a 72 year old woman with a 100 pk-year smoking history who was admitted to [facility] at the end of November 2017 with acute respiratory failure with hypoxemia. She had a CXR that ruled out pneumonia and she was felt to have an exacerbation of COPD. She was treated with bipap, nebs and oxygen with improvement and she was sent home on oxygen. She had a CTA of the chest on admission which showed no evidence of PE but did show a 0.9 x 0.7 cm spiculated density in the RUL which had increased in size compared to a prior CT on 12/14/2014. There was a small stellate density in the subpleural RUL that was unchanged. There was mild ground glass opacity in the lingula and mild consolidation in the posterior LLL that were felt to be consistent with atelectasis or infiltrate. There was a focus of ground-glass density in the lateral subpleural LUL that was felt to be consistent with infection or inflammation. There was a 1.1 cm hyperenhancing lesion in the posterior right hepatic lobe. She underwent a PET scan on 11/15/2016 which showed mild hypermetabolism in the 0.8 cm RUL apical lesion with an SUV max of 2.7. There were at least 7 other tiny pulmonary nodules measuring up to 0.5 mm scattered throughout both lungs, all below PET resolution. There was a solitary hypermetabolic non-enlarged right inguinal lymph node that was felt to be reactive.She was not felt to be a candidate for lung resection due to severe COPD, chronic hypoxemia and dyspnea with minimal activity. The dominant lung lesion was small so we decided to follow it. The dominant lesion was stable at 6 x 8 mm on CT in 11/2016 so we continued following this. 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 The RUL dominant lung lesion was slightly larger on a follow up CT in  09/2017. She had a follow up PET scan on 10/17/2017 that showed three hypermetabolic nodules in the RUL that were increased in size and metabolic activity compared to her prior PET but there was no mediastinal adenopathy. She had a CT guided needle biopsy of the dominant lung lesion that showed non-small cell lung cancer that was adenocarcinoma. She has started Xeloda on 01/07/2018 for her appendiceal adenocarcinoma. A follow up chest CT on 01/21/2018 showed new lymphadenopathy in the right paratracheal and pretracheal region measuring 2 cm and suspicious for metastasis. She continues to do well after starting chemotherapy.      Current Outpatient Medications  Medication Sig Dispense Refill  . albuterol (PROVENTIL HFA;VENTOLIN HFA) 108 (90 BASE) MCG/ACT inhaler Inhale 2 puffs into the lungs every 6 (six) hours as needed. Shortness of Breath    . budesonide (PULMICORT) 180 MCG/ACT inhaler Inhale 2 puffs into the lungs 2 (two) times daily.    . capecitabine (XELODA) 500 MG tablet TAKE 3 TABLETS (1,500 MG TOTAL) BY MOUTH 2 (TWO) TIMES DAILY AFTER A MEAL. TAKE FOR 14 DAYS ON, 7 DAYS OFF, REPEAT EVERY 21 DAYS 84 tablet 0  . ibuprofen (ADVIL,MOTRIN) 200 MG tablet Take 400 mg by mouth every 6 (six) hours as needed. Pain    . Multiple Vitamin (MULTIVITAMIN WITH MINERALS) TABS Take 1 tablet by mouth daily.     No current facility-administered medications for this visit.      Physical Exam: BP (!) 160/80 (BP Location: Left Arm, Patient Position:  Sitting, Cuff Size: Normal)   Pulse 80   Resp 18   Ht 5\' 2"  (1.575 m)   Wt 128 lb 6.4 oz (58.2 kg)   SpO2 95% Comment: RA  BMI 23.48 kg/m  She looks chronically ill but unchanged from her baseline. In good spirits. Lungs are clear Cardiac exam shows a regular rate and rhythm with normal heart sounds.  Diagnostic Tests:  CLINICAL DATA:  Lung cancer.  EXAM: CT CHEST WITH CONTRAST  TECHNIQUE: Multidetector CT imaging of the chest was performed  during intravenous contrast administration.  CONTRAST:  75 cc Isovue-300.  COMPARISON:  PET 12/17/2016 and CT chest 10/03/2017.  FINDINGS: Cardiovascular: Atherosclerotic calcification of the arterial vasculature including coronary arteries. Pulmonary arteries are enlarged. Left ventricle appears slightly dilated. Heart size is normal. No pericardial effusion.  Mediastinum/Nodes: New mediastinal adenopathy is seen in the prevascular and right paratracheal stations. Largest lymph node is seen in the prevascular space, measuring 2.0 cm in short axis, previously 6 mm. No hilar or axillary adenopathy. Esophagus is grossly unremarkable.  Lungs/Pleura: Biapical pleural-parenchymal scarring. Centrilobular emphysema. Apical segment right upper lobe nodule measures 6 mm, stable. Spiculated nodule along the minor fissure (image 60) measures 8 mm, stable. Additional scattered tiny pulmonary nodules are unchanged as well. Scarring in the lingula and right middle lobe. No pleural fluid. Airway is unremarkable.  Upper Abdomen: 9 mm hyperattenuating lesion in the posterior right hepatic lobe may represent a flash fill hemangioma or perfusion anomaly. Definitive characterization is limited due to size and lack of precontrast imaging. Visualized portions of the liver, gallbladder, adrenal glands, kidneys, spleen, pancreas, stomach and bowel are grossly unremarkable. No upper abdominal adenopathy.  Musculoskeletal: No worrisome lytic or sclerotic lesions. Degenerative changes in the spine.  IMPRESSION: 1. New mediastinal adenopathy, indicative of disease progression. 2. Right lung nodules are stable. 3.  Aortic atherosclerosis (ICD10-170.0).  Coronary calcification. 4. Enlarged pulmonary arteries, indicative pulmonary arterial hypertension.   Electronically Signed   By: Lorin Picket M.D.   On: 01/21/2018 09:09   Impression:  She has adenocarcinoma of the right lung with  three hypermetabolic nodules in the RUL, new mediastinal adenopathy, and a recently resected adenocarcinoma/goblet cell carcinoid of the appendix with lymph nodes mets. Oncology would like to get a biopsy of the mediastinal adenopathy. I think the best procedure to do this and get enough tissue is with mediastinoscopy. I discussed the procedure, alternatives, benefits and risks with the patient and her husband including but not limited to bleeding, infection, injury to mediastinal structures and she understands and agrees to proceed.   Plan:  Mediastinoscopy with lymph node biopsies on Monday 02/04/2018.   I spent 15 minutes performing this established patient evaluation and > 50% of this time was spent face to face counseling and coordinating the care of this patient's mediastinal adenopathy.    Gaye Pollack, MD [facility] [phone]

## 2018-01-31 NOTE — Pre-Procedure Instructions (Signed)
Melissa Golden  01/31/2018      [facility] [address] [place] [address] Phone: [phone] Fax: [phone]  [place], [place] - [address] [place] [address] Phone: [phone] Fax: [phone]    Your procedure is scheduled on Monday March 11.  Report to [facility] Admitting at 5:30 A.M.  Call this number if you have problems the morning of surgery:  [phone]   Remember:  Do not eat food or drink liquids after midnight.  Take these medicines the morning of surgery with A SIP OF WATER:   Capecitabine (Xeloda) Albuterol inhaler if needed (pleae bring inhaler to hospital with you) Pulmicort  7 days prior to surgery STOP taking any Aspirin(unless otherwise instructed by your surgeon), Aleve, Naproxen, Ibuprofen, Motrin, Advil, Goody's, BC's, all herbal medications, fish oil, and all vitamins   Do not wear jewelry, make-up or nail polish.  Do not wear lotions, powders, or perfumes, or deodorant.  Do not shave 48 hours prior to surgery.  Men may shave face and neck.  Do not bring valuables to the hospital.  [facility] is not responsible for any belongings or valuables.  Contacts, dentures or bridgework may not be worn into surgery.  Leave your suitcase in the car.  After surgery it may be brought to your room.  For patients admitted to the hospital, discharge time will be determined by your treatment team.  Patients discharged the day of surgery will not be allowed to drive home.    Special instructions:    [facility]- Preparing For Surgery  Before surgery, you can play an important role. Because skin is not sterile, your skin needs to be as free of germs as possible. You can reduce the number of germs on your skin by washing with CHG (chlorahexidine gluconate) Soap before surgery.  CHG is an antiseptic cleaner which kills germs  and bonds with the skin to continue killing germs even after washing.  Please do not use if you have an allergy to CHG or antibacterial soaps. If your skin becomes reddened/irritated stop using the CHG.  Do not shave (including legs and underarms) for at least 48 hours prior to first CHG shower. It is OK to shave your face.  Please follow these instructions carefully.   1. Shower the NIGHT BEFORE SURGERY and the MORNING OF SURGERY with CHG.   2. If you chose to wash your hair, wash your hair first as usual with your normal shampoo.  3. After you shampoo, rinse your hair and body thoroughly to remove the shampoo.  4. Use CHG as you would any other liquid soap. You can apply CHG directly to the skin and wash gently with a scrungie or a clean washcloth.   5. Apply the CHG Soap to your body ONLY FROM THE NECK DOWN.  Do not use on open wounds or open sores. Avoid contact with your eyes, ears, mouth and genitals (private parts). Wash Face and genitals (private parts)  with your normal soap.  6. Wash thoroughly, paying special attention to the area where your surgery will be performed.  7. Thoroughly rinse your body with warm water from the neck down.  8. DO NOT shower/wash with your normal soap after using and rinsing off the CHG Soap.  9. Pat yourself dry with a CLEAN TOWEL.  10. Wear CLEAN PAJAMAS to  bed the night before surgery, wear comfortable clothes the morning of surgery  11. Place CLEAN SHEETS on your bed the night of your first shower and DO NOT SLEEP WITH PETS.    Day of Surgery: Do not apply any deodorants/lotions. Please wear clean clothes to the hospital/surgery center.      Please read over the following fact sheets that you were given. Coughing and Deep Breathing, MRSA Information and Surgical Site Infection Prevention

## 2018-02-01 ENCOUNTER — Ambulatory Visit (HOSPITAL_COMMUNITY)
Admission: RE | Admit: 2018-02-01 | Discharge: 2018-02-01 | Disposition: A | Discharge status: Home / Self Care | Payer: Medicare Other | Source: Ambulatory Visit | Attending: Surgery | Admitting: Surgery

## 2018-02-01 ENCOUNTER — Encounter (HOSPITAL_COMMUNITY): Payer: Self-pay

## 2018-02-01 ENCOUNTER — Encounter (HOSPITAL_COMMUNITY)
Admission: RE | Admit: 2018-02-01 | Discharge: 2018-02-01 | Disposition: A | Discharge status: Home / Self Care | Payer: Medicare Other | Source: Ambulatory Visit | Attending: Surgery | Admitting: Surgery

## 2018-02-01 DIAGNOSIS — I959 Hypotension, unspecified: Secondary | ICD-10-CM | POA: Diagnosis not present

## 2018-02-01 DIAGNOSIS — Z87891 Personal history of nicotine dependence: Secondary | ICD-10-CM | POA: Diagnosis not present

## 2018-02-01 DIAGNOSIS — Z01810 Encounter for preprocedural cardiovascular examination: Secondary | ICD-10-CM

## 2018-02-01 DIAGNOSIS — D62 Acute posthemorrhagic anemia: Secondary | ICD-10-CM | POA: Diagnosis not present

## 2018-02-01 DIAGNOSIS — R9431 Abnormal electrocardiogram [ECG] [EKG]: Secondary | ICD-10-CM

## 2018-02-01 DIAGNOSIS — Z01812 Encounter for preprocedural laboratory examination: Secondary | ICD-10-CM

## 2018-02-01 DIAGNOSIS — R590 Localized enlarged lymph nodes: Secondary | ICD-10-CM | POA: Insufficient documentation

## 2018-02-01 DIAGNOSIS — C3491 Malignant neoplasm of unspecified part of right bronchus or lung: Secondary | ICD-10-CM | POA: Diagnosis not present

## 2018-02-01 DIAGNOSIS — I2510 Atherosclerotic heart disease of native coronary artery without angina pectoris: Secondary | ICD-10-CM | POA: Diagnosis not present

## 2018-02-01 DIAGNOSIS — I700 Atherosclerosis of aorta: Secondary | ICD-10-CM | POA: Diagnosis not present

## 2018-02-01 DIAGNOSIS — C771 Secondary and unspecified malignant neoplasm of intrathoracic lymph nodes: Secondary | ICD-10-CM | POA: Diagnosis not present

## 2018-02-01 DIAGNOSIS — Z9981 Dependence on supplemental oxygen: Secondary | ICD-10-CM | POA: Diagnosis not present

## 2018-02-01 DIAGNOSIS — R918 Other nonspecific abnormal finding of lung field: Secondary | ICD-10-CM | POA: Diagnosis not present

## 2018-02-01 DIAGNOSIS — J432 Centrilobular emphysema: Secondary | ICD-10-CM | POA: Diagnosis not present

## 2018-02-01 DIAGNOSIS — J942 Hemothorax: Secondary | ICD-10-CM | POA: Diagnosis not present

## 2018-02-01 DIAGNOSIS — I2721 Secondary pulmonary arterial hypertension: Secondary | ICD-10-CM | POA: Diagnosis not present

## 2018-02-01 DIAGNOSIS — Z79899 Other long term (current) drug therapy: Secondary | ICD-10-CM | POA: Diagnosis not present

## 2018-02-01 HISTORY — DX: Malignant (primary) neoplasm, unspecified: C80.1

## 2018-02-01 LAB — COMPREHENSIVE METABOLIC PANEL
ALT: 19 U/L (ref 14–54)
ANION GAP: 12 (ref 5–15)
AST: 28 U/L (ref 15–41)
Albumin: 4.1 g/dL (ref 3.5–5.0)
Alkaline Phosphatase: 84 U/L (ref 38–126)
BUN: 7 mg/dL (ref 6–20)
CALCIUM: 9.2 mg/dL (ref 8.9–10.3)
CO2: 28 mmol/L (ref 22–32)
Chloride: 99 mmol/L — ABNORMAL LOW (ref 101–111)
Creatinine, Ser: 0.6 mg/dL (ref 0.44–1.00)
GFR calc non Af Amer: >60 mL/min (ref >60)
Glucose, Bld: 96 mg/dL (ref 65–99)
Potassium: 4 mmol/L (ref 3.5–5.1)
SODIUM: 139 mmol/L (ref 135–145)
TOTAL PROTEIN: 7.3 g/dL (ref 6.5–8.1)
Total Bilirubin: 0.9 mg/dL (ref 0.3–1.2)

## 2018-02-01 LAB — CBC
HCT: 43 % (ref 36.0–46.0)
Hemoglobin: 13.9 g/dL (ref 12.0–15.0)
MCH: 31.7 pg (ref 26.0–34.0)
MCHC: 32.3 g/dL (ref 30.0–36.0)
MCV: 98.2 fL (ref 78.0–100.0)
PLATELETS: 177 10*3/uL (ref 150–400)
RBC: 4.38 MIL/uL (ref 3.87–5.11)
RDW: 13.5 % (ref 11.5–15.5)
WBC: 7.7 10*3/uL (ref 4.0–10.5)

## 2018-02-01 LAB — APTT: aPTT: 30 seconds (ref 24–36)

## 2018-02-01 LAB — ABO/RH: ABO/RH(D): A POS

## 2018-02-01 LAB — SURGICAL PCR SCREEN
MRSA, PCR: NEGATIVE
Staphylococcus aureus: NEGATIVE

## 2018-02-01 LAB — PROTIME-INR
INR: 0.97
Prothrombin Time: 12.8 seconds (ref 11.4–15.2)

## 2018-02-03 NOTE — Anesthesia Preprocedure Evaluation (Addendum)
Anesthesia Evaluation  Patient identified by MRN, date of birth, ID band Patient awake    Reviewed: Allergy & Precautions, H&P , NPO status , Patient's Chart, lab work & pertinent test results  Airway Mallampati: II  TM Distance: >3 FB Neck ROM: Full    Dental no notable dental hx. (+) Dental Advisory Given, Edentulous Upper, Edentulous Lower   Pulmonary shortness of breath and with exertion, asthma , COPD,  COPD inhaler, former smoker,    Pulmonary exam normal breath sounds clear to auscultation       Cardiovascular Exercise Tolerance: Good negative cardio ROS   Rhythm:Regular Rate:Normal     Neuro/Psych negative neurological ROS  negative psych ROS   GI/Hepatic negative GI ROS, Neg liver ROS,   Endo/Other  negative endocrine ROS  Renal/GU negative Renal ROS  negative genitourinary   Musculoskeletal   Abdominal   Peds  Hematology negative hematology ROS (+)   Anesthesia Other Findings   Reproductive/Obstetrics negative OB ROS                            Anesthesia Physical Anesthesia Plan  ASA: II  Anesthesia Plan: General   Post-op Pain Management:    Induction: Intravenous  PONV Risk Score and Plan: 4 or greater and Ondansetron, Dexamethasone and Midazolam  Airway Management Planned: Oral ETT  Additional Equipment:   Intra-op Plan:   Post-operative Plan: Extubation in OR  Informed Consent: I have reviewed the patients History and Physical, chart, labs and discussed the procedure including the risks, benefits and alternatives for the proposed anesthesia with the patient or authorized representative who has indicated his/her understanding and acceptance.   Dental advisory given  Plan Discussed with: CRNA  Anesthesia Plan Comments:         Anesthesia Quick Evaluation

## 2018-02-04 ENCOUNTER — Encounter (HOSPITAL_COMMUNITY): Payer: Self-pay | Admitting: *Deleted

## 2018-02-04 ENCOUNTER — Ambulatory Visit (HOSPITAL_COMMUNITY): Payer: Medicare Other

## 2018-02-04 ENCOUNTER — Inpatient Hospital Stay (HOSPITAL_COMMUNITY)
Admission: RE | Admit: 2018-02-04 | Discharge: 2018-02-08 | DRG: 841 | Disposition: A | Discharge status: Home / Self Care | Payer: Medicare Other | Source: Ambulatory Visit | Attending: Surgery | Admitting: Surgery

## 2018-02-04 ENCOUNTER — Ambulatory Visit (HOSPITAL_COMMUNITY): Payer: Medicare Other | Admitting: Anesthesiology

## 2018-02-04 ENCOUNTER — Encounter (HOSPITAL_COMMUNITY)
Admission: RE | Disposition: A | Discharge status: Home / Self Care | Payer: Self-pay | Source: Ambulatory Visit | Attending: Surgery

## 2018-02-04 ENCOUNTER — Other Ambulatory Visit: Payer: Self-pay

## 2018-02-04 DIAGNOSIS — D62 Acute posthemorrhagic anemia: Secondary | ICD-10-CM | POA: Diagnosis not present

## 2018-02-04 DIAGNOSIS — Z87891 Personal history of nicotine dependence: Secondary | ICD-10-CM

## 2018-02-04 DIAGNOSIS — I2510 Atherosclerotic heart disease of native coronary artery without angina pectoris: Secondary | ICD-10-CM | POA: Diagnosis present

## 2018-02-04 DIAGNOSIS — R590 Localized enlarged lymph nodes: Secondary | ICD-10-CM

## 2018-02-04 DIAGNOSIS — I959 Hypotension, unspecified: Secondary | ICD-10-CM | POA: Diagnosis not present

## 2018-02-04 DIAGNOSIS — R911 Solitary pulmonary nodule: Secondary | ICD-10-CM

## 2018-02-04 DIAGNOSIS — Z4682 Encounter for fitting and adjustment of non-vascular catheter: Secondary | ICD-10-CM | POA: Diagnosis not present

## 2018-02-04 DIAGNOSIS — Z79899 Other long term (current) drug therapy: Secondary | ICD-10-CM

## 2018-02-04 DIAGNOSIS — C3411 Malignant neoplasm of upper lobe, right bronchus or lung: Secondary | ICD-10-CM | POA: Diagnosis not present

## 2018-02-04 DIAGNOSIS — I2721 Secondary pulmonary arterial hypertension: Secondary | ICD-10-CM | POA: Diagnosis present

## 2018-02-04 DIAGNOSIS — I700 Atherosclerosis of aorta: Secondary | ICD-10-CM | POA: Diagnosis present

## 2018-02-04 DIAGNOSIS — Z9981 Dependence on supplemental oxygen: Secondary | ICD-10-CM

## 2018-02-04 DIAGNOSIS — J942 Hemothorax: Secondary | ICD-10-CM | POA: Diagnosis not present

## 2018-02-04 DIAGNOSIS — J432 Centrilobular emphysema: Secondary | ICD-10-CM | POA: Diagnosis present

## 2018-02-04 DIAGNOSIS — C3491 Malignant neoplasm of unspecified part of right bronchus or lung: Secondary | ICD-10-CM | POA: Diagnosis present

## 2018-02-04 DIAGNOSIS — C771 Secondary and unspecified malignant neoplasm of intrathoracic lymph nodes: Secondary | ICD-10-CM | POA: Diagnosis present

## 2018-02-04 DIAGNOSIS — J449 Chronic obstructive pulmonary disease, unspecified: Secondary | ICD-10-CM | POA: Diagnosis not present

## 2018-02-04 DIAGNOSIS — J90 Pleural effusion, not elsewhere classified: Secondary | ICD-10-CM | POA: Diagnosis not present

## 2018-02-04 DIAGNOSIS — C801 Malignant (primary) neoplasm, unspecified: Secondary | ICD-10-CM | POA: Diagnosis not present

## 2018-02-04 DIAGNOSIS — J45909 Unspecified asthma, uncomplicated: Secondary | ICD-10-CM | POA: Diagnosis not present

## 2018-02-04 DIAGNOSIS — J939 Pneumothorax, unspecified: Secondary | ICD-10-CM | POA: Diagnosis not present

## 2018-02-04 DIAGNOSIS — J9811 Atelectasis: Secondary | ICD-10-CM | POA: Diagnosis not present

## 2018-02-04 HISTORY — PX: LYMPH NODE BIOPSY: SHX201

## 2018-02-04 HISTORY — PX: MEDIASTINOSCOPY: SHX5086

## 2018-02-04 LAB — CBC
HEMATOCRIT: 27.6 % — AB (ref 36.0–46.0)
HEMOGLOBIN: 8.8 g/dL — AB (ref 12.0–15.0)
MCH: 31.5 pg (ref 26.0–34.0)
MCHC: 31.9 g/dL (ref 30.0–36.0)
MCV: 98.9 fL (ref 78.0–100.0)
Platelets: 130 10*3/uL — ABNORMAL LOW (ref 150–400)
RBC: 2.79 MIL/uL — ABNORMAL LOW (ref 3.87–5.11)
RDW: 13.5 % (ref 11.5–15.5)
WBC: 11.1 10*3/uL — ABNORMAL HIGH (ref 4.0–10.5)

## 2018-02-04 LAB — PREPARE RBC (CROSSMATCH)

## 2018-02-04 SURGERY — MEDIASTINOSCOPY
Anesthesia: General | Site: Neck

## 2018-02-04 MED ORDER — SUGAMMADEX SODIUM 200 MG/2ML IV SOLN
INTRAVENOUS | Status: DC | PRN
  Administered 2018-02-04: 120 mg via INTRAVENOUS

## 2018-02-04 MED ORDER — HYDROMORPHONE HCL 1 MG/ML IJ SOLN
0.2500 mg | INTRAMUSCULAR | Status: DC | PRN
  Administered 2018-02-04 (×4): 0.5 mg via INTRAVENOUS

## 2018-02-04 MED ORDER — SODIUM CHLORIDE 0.9 % IV SOLN: Freq: Once | INTRAVENOUS | Status: DC

## 2018-02-04 MED ORDER — ACETAMINOPHEN 500 MG PO TABS
1000.0000 mg | ORAL_TABLET | Freq: Four times a day (QID) | ORAL | Status: DC
  Administered 2018-02-04 – 2018-02-07 (×11): 1000 mg via ORAL
  Filled 2018-02-04 (×12): qty 2

## 2018-02-04 MED ORDER — HYDROMORPHONE HCL 1 MG/ML IJ SOLN
INTRAMUSCULAR | Status: AC
  Administered 2018-02-04: 0.5 mg via INTRAVENOUS
  Filled 2018-02-04: qty 1

## 2018-02-04 MED ORDER — OXYCODONE HCL 5 MG PO TABS: 5.0000 mg | ORAL_TABLET | Freq: Three times a day (TID) | ORAL | 0 refills | Status: DC | PRN

## 2018-02-04 MED ORDER — BISACODYL 5 MG PO TBEC
10.0000 mg | DELAYED_RELEASE_TABLET | Freq: Every day | ORAL | Status: DC
  Administered 2018-02-04 – 2018-02-05 (×2): 10 mg via ORAL
  Filled 2018-02-04 (×3): qty 2

## 2018-02-04 MED ORDER — PROPOFOL 10 MG/ML IV BOLUS
INTRAVENOUS | Status: DC | PRN
  Administered 2018-02-04: 90 mg via INTRAVENOUS

## 2018-02-04 MED ORDER — ORAL CARE MOUTH RINSE
15.0000 mL | Freq: Two times a day (BID) | OROMUCOSAL | Status: DC
  Administered 2018-02-04 – 2018-02-07 (×5): 15 mL via OROMUCOSAL

## 2018-02-04 MED ORDER — MIDAZOLAM HCL 2 MG/2ML IJ SOLN
INTRAMUSCULAR | Status: AC
  Filled 2018-02-04: qty 2

## 2018-02-04 MED ORDER — ALBUMIN HUMAN 5 % IV SOLN
12.5000 g | Freq: Once | INTRAVENOUS | Status: AC
  Administered 2018-02-04: 12.5 g via INTRAVENOUS

## 2018-02-04 MED ORDER — ALBUMIN HUMAN 5 % IV SOLN
INTRAVENOUS | Status: AC
  Administered 2018-02-04: 12.5 g via INTRAVENOUS
  Filled 2018-02-04: qty 250

## 2018-02-04 MED ORDER — HEMOSTATIC AGENTS (NO CHARGE) OPTIME
TOPICAL | Status: DC | PRN
  Administered 2018-02-04 (×2): 1 via TOPICAL

## 2018-02-04 MED ORDER — ROCURONIUM BROMIDE 10 MG/ML (PF) SYRINGE
PREFILLED_SYRINGE | INTRAVENOUS | Status: DC | PRN
  Administered 2018-02-04: 40 mg via INTRAVENOUS
  Administered 2018-02-04: 10 mg via INTRAVENOUS
  Administered 2018-02-04: 20 mg via INTRAVENOUS

## 2018-02-04 MED ORDER — DEXAMETHASONE SODIUM PHOSPHATE 10 MG/ML IJ SOLN
INTRAMUSCULAR | Status: DC | PRN
  Administered 2018-02-04: 10 mg via INTRAVENOUS

## 2018-02-04 MED ORDER — PROPOFOL 10 MG/ML IV BOLUS
INTRAVENOUS | Status: AC
  Filled 2018-02-04: qty 20

## 2018-02-04 MED ORDER — LEVALBUTEROL HCL 0.63 MG/3ML IN NEBU
0.6300 mg | INHALATION_SOLUTION | Freq: Four times a day (QID) | RESPIRATORY_TRACT | Status: DC
  Administered 2018-02-04 (×2): 0.63 mg via RESPIRATORY_TRACT
  Filled 2018-02-04 (×2): qty 3

## 2018-02-04 MED ORDER — LIDOCAINE 2% (20 MG/ML) 5 ML SYRINGE
INTRAMUSCULAR | Status: DC | PRN
  Administered 2018-02-04: 60 mg via INTRAVENOUS

## 2018-02-04 MED ORDER — FENTANYL CITRATE (PF) 250 MCG/5ML IJ SOLN
INTRAMUSCULAR | Status: AC
  Filled 2018-02-04: qty 5

## 2018-02-04 MED ORDER — 0.9 % SODIUM CHLORIDE (POUR BTL) OPTIME
TOPICAL | Status: DC | PRN
  Administered 2018-02-04: 1000 mL

## 2018-02-04 MED ORDER — ALBUTEROL SULFATE (2.5 MG/3ML) 0.083% IN NEBU: 2.5000 mg | INHALATION_SOLUTION | Freq: Four times a day (QID) | RESPIRATORY_TRACT | Status: DC | PRN

## 2018-02-04 MED ORDER — PHENYLEPHRINE 40 MCG/ML (10ML) SYRINGE FOR IV PUSH (FOR BLOOD PRESSURE SUPPORT)
PREFILLED_SYRINGE | INTRAVENOUS | Status: DC | PRN
  Administered 2018-02-04: 40 ug via INTRAVENOUS
  Administered 2018-02-04: 80 ug via INTRAVENOUS

## 2018-02-04 MED ORDER — SENNOSIDES-DOCUSATE SODIUM 8.6-50 MG PO TABS
1.0000 | ORAL_TABLET | Freq: Every day | ORAL | Status: DC
  Administered 2018-02-04 – 2018-02-05 (×2): 1 via ORAL
  Filled 2018-02-04 (×2): qty 1

## 2018-02-04 MED ORDER — ONDANSETRON HCL 4 MG/2ML IJ SOLN
4.0000 mg | Freq: Four times a day (QID) | INTRAMUSCULAR | Status: DC | PRN
  Administered 2018-02-04 – 2018-02-05 (×2): 4 mg via INTRAVENOUS
  Filled 2018-02-04 (×2): qty 2

## 2018-02-04 MED ORDER — SODIUM CHLORIDE 0.9 % IV SOLN
INTRAVENOUS | Status: DC
  Administered 2018-02-04 – 2018-02-05 (×2): via INTRAVENOUS

## 2018-02-04 MED ORDER — ONDANSETRON HCL 4 MG/2ML IJ SOLN
INTRAMUSCULAR | Status: DC | PRN
Start: 2018-02-04 — End: 2018-02-04
  Administered 2018-02-04: 4 mg via INTRAVENOUS

## 2018-02-04 MED ORDER — PHENYLEPHRINE HCL 10 MG/ML IJ SOLN
INTRAVENOUS | Status: DC | PRN
  Administered 2018-02-04: 40 ug/min via INTRAVENOUS

## 2018-02-04 MED ORDER — BUDESONIDE 0.25 MG/2ML IN SUSP
0.2500 mg | Freq: Two times a day (BID) | RESPIRATORY_TRACT | Status: DC
  Administered 2018-02-04 – 2018-02-08 (×9): 0.25 mg via RESPIRATORY_TRACT
  Filled 2018-02-04 (×9): qty 2

## 2018-02-04 MED ORDER — POTASSIUM CHLORIDE 10 MEQ/50ML IV SOLN: 10.0000 meq | Freq: Every day | INTRAVENOUS | Status: DC | PRN

## 2018-02-04 MED ORDER — FENTANYL CITRATE (PF) 250 MCG/5ML IJ SOLN
INTRAMUSCULAR | Status: DC | PRN
  Administered 2018-02-04: 50 ug via INTRAVENOUS
  Administered 2018-02-04: 100 ug via INTRAVENOUS

## 2018-02-04 MED ORDER — LACTATED RINGERS IV SOLN
INTRAVENOUS | Status: DC | PRN
  Administered 2018-02-04: 07:00:00 via INTRAVENOUS

## 2018-02-04 MED ORDER — MIDAZOLAM HCL 5 MG/5ML IJ SOLN
INTRAMUSCULAR | Status: DC | PRN
  Administered 2018-02-04: 1 mg via INTRAVENOUS

## 2018-02-04 MED ORDER — ACETAMINOPHEN 160 MG/5ML PO SOLN: 1000.0000 mg | Freq: Four times a day (QID) | ORAL | Status: DC

## 2018-02-04 MED ORDER — CEFAZOLIN SODIUM-DEXTROSE 2-4 GM/100ML-% IV SOLN
2.0000 g | INTRAVENOUS | Status: AC
  Administered 2018-02-04: 2 g via INTRAVENOUS
  Filled 2018-02-04: qty 100

## 2018-02-04 MED ORDER — OXYCODONE HCL 5 MG PO TABS
5.0000 mg | ORAL_TABLET | ORAL | Status: DC | PRN
  Administered 2018-02-04 – 2018-02-05 (×3): 10 mg via ORAL
  Administered 2018-02-06 – 2018-02-07 (×5): 5 mg via ORAL
  Administered 2018-02-07: 10 mg via ORAL
  Administered 2018-02-08: 5 mg via ORAL
  Filled 2018-02-04: qty 2
  Filled 2018-02-04: qty 1
  Filled 2018-02-04: qty 2
  Filled 2018-02-04: qty 1
  Filled 2018-02-04: qty 2
  Filled 2018-02-04 (×2): qty 1
  Filled 2018-02-04 (×2): qty 2
  Filled 2018-02-04 (×2): qty 1

## 2018-02-04 MED ORDER — MORPHINE SULFATE (PF) 4 MG/ML IV SOLN
2.0000 mg | INTRAVENOUS | Status: DC | PRN
  Administered 2018-02-04 (×3): 2 mg via INTRAVENOUS
  Filled 2018-02-04 (×3): qty 1

## 2018-02-04 SURGICAL SUPPLY — 40 items
ADH SKN CLS APL DERMABOND .7 (GAUZE/BANDAGES/DRESSINGS) ×1
BLADE SURG 15 STRL LF DISP TIS (BLADE) ×1 IMPLANT
BLADE SURG 15 STRL SS (BLADE) ×3
CANISTER SUCT 3000ML PPV (MISCELLANEOUS) ×3 IMPLANT
CLIP VESOCCLUDE MED 6/CT (CLIP) ×3 IMPLANT
CONT SPEC 4OZ CLIKSEAL STRL BL (MISCELLANEOUS) ×8 IMPLANT
COVER SURGICAL LIGHT HANDLE (MISCELLANEOUS) ×6 IMPLANT
DERMABOND ADVANCED (GAUZE/BANDAGES/DRESSINGS) ×2
DERMABOND ADVANCED .7 DNX12 (GAUZE/BANDAGES/DRESSINGS) ×1 IMPLANT
DRAPE LAPAROTOMY T 102X78X121 (DRAPES) ×3 IMPLANT
ELECT CAUTERY BLADE 6.4 (BLADE) ×3 IMPLANT
ELECT REM PT RETURN 9FT ADLT (ELECTROSURGICAL) ×3
ELECTRODE REM PT RTRN 9FT ADLT (ELECTROSURGICAL) ×1 IMPLANT
GAUZE SPONGE 4X4 12PLY STRL (GAUZE/BANDAGES/DRESSINGS) ×3 IMPLANT
GAUZE SPONGE 4X4 16PLY XRAY LF (GAUZE/BANDAGES/DRESSINGS) ×2 IMPLANT
GLOVE EUDERMIC 7 POWDERFREE (GLOVE) ×3 IMPLANT
GOWN STRL REUS W/ TWL LRG LVL3 (GOWN DISPOSABLE) ×1 IMPLANT
GOWN STRL REUS W/ TWL XL LVL3 (GOWN DISPOSABLE) ×1 IMPLANT
GOWN STRL REUS W/TWL LRG LVL3 (GOWN DISPOSABLE) ×3
GOWN STRL REUS W/TWL XL LVL3 (GOWN DISPOSABLE) ×3
HEMOSTAT SURGICEL 2X14 (HEMOSTASIS) IMPLANT
KIT BASIN OR (CUSTOM PROCEDURE TRAY) ×3 IMPLANT
KIT ROOM TURNOVER OR (KITS) ×3 IMPLANT
NS IRRIG 1000ML POUR BTL (IV SOLUTION) ×3 IMPLANT
PACK SURGICAL SETUP 50X90 (CUSTOM PROCEDURE TRAY) ×3 IMPLANT
PAD ARMBOARD 7.5X6 YLW CONV (MISCELLANEOUS) ×6 IMPLANT
PENCIL BUTTON HOLSTER BLD 10FT (ELECTRODE) ×3 IMPLANT
SPONGE INTESTINAL PEANUT (DISPOSABLE) ×3 IMPLANT
SUT SILK 2 0 TIES 10X30 (SUTURE) IMPLANT
SUT VIC AB 2-0 CT1 27 (SUTURE) ×3
SUT VIC AB 2-0 CT1 TAPERPNT 27 (SUTURE) ×1 IMPLANT
SUT VIC AB 3-0 SH 27 (SUTURE)
SUT VIC AB 3-0 SH 27X BRD (SUTURE) IMPLANT
SUT VICRYL 4-0 PS2 18IN ABS (SUTURE) ×3 IMPLANT
SYR 10ML LL (SYRINGE) ×3 IMPLANT
TOWEL GREEN STERILE (TOWEL DISPOSABLE) ×3 IMPLANT
TOWEL GREEN STERILE FF (TOWEL DISPOSABLE) ×3 IMPLANT
TUBE CONNECTING 12'X1/4 (SUCTIONS) ×1
TUBE CONNECTING 12X1/4 (SUCTIONS) ×2 IMPLANT
WATER STERILE IRR 1000ML POUR (IV SOLUTION) ×3 IMPLANT

## 2018-02-04 NOTE — Transfer of Care (Signed)
Immediate Anesthesia Transfer of Care Note  Patient: Melissa Golden  Procedure(s) Performed: MEDIASTINOSCOPY (N/A Neck) LYMPH NODE BIOPSY (N/A Neck)  Patient Location: PACU  Anesthesia Type:General  Level of Consciousness: awake, alert  and oriented  Airway & Oxygen Therapy: Patient Spontanous Breathing and Patient connected to nasal cannula oxygen  Post-op Assessment: Report given to RN and Post -op Vital signs reviewed and stable  Post vital signs: Reviewed and stable  Last Vitals:  Vitals:   02/04/18 0554 02/04/18 0900  BP: (!) 165/76   Pulse: (!) 59   Resp: 18   Temp: 36.6 C (!) 36.3 C  SpO2: 100%     Last Pain: There were no vitals filed for this visit.       Complications: No apparent anesthesia complications

## 2018-02-04 NOTE — Progress Notes (Signed)
CT Surgery:  Postop CXR showed a new right pleural effusion and right lung atelectasis. She has had some hypotension in the PACU. Suspected hemothorax. After sterile prep and drape a 28 F chest tube was inserted in right pleural space and 850 cc of blood drained immediately. CXR pending. Will admit to ICU and closely monitor for any continued bleeding that could require surgical correction. Type and cross ordered. Will discuss with husband.

## 2018-02-04 NOTE — Progress Notes (Signed)
Patient ID: Melissa Golden, female   DOB: 1945-12-12, 72 y.o.   MRN: [id number] [facility]:  She has been hemodynamically stable this afternoon. Chest tube output minimal. Hgb this afternoon was 8.8 from blood loss and dilution.  Some nausea this afternoon but breathing is ok. Continue close observation in ICU.

## 2018-02-04 NOTE — H&P (Signed)
[address]       [place] [address]             [phone]      Cardiothoracic Surgery Out-Patient Admission History and Physical  Reason for procedure: Mediastinal adenopathy with right upper lobe lung cancer   Clinical History:  The patient returns today for evaluation of new mediastinal lymphadenopathy. She is a 72 year old woman with a 100 pk-year smoking history who was admitted to [facility] at the end of November 2017 with acute respiratory failure with hypoxemia. She had a CXR that ruled out pneumonia and she was felt to have an exacerbation of COPD. She was treated with bipap, nebs and oxygen with improvement and she was sent home on oxygen. She had a CTA of the chest on admission which showed no evidence of PE but did show a 0.9 x 0.7 cm spiculated density in the RUL which had increased in size compared to a prior CT on 12/14/2014. There was a small stellate density in the subpleural RUL that was unchanged. There was mild ground glass opacity in the lingula and mild consolidation in the posterior LLL that were felt to be consistent with atelectasis or infiltrate. There was a focus of ground-glass density in the lateral subpleural LUL that was felt to be consistent with infection or inflammation. There was a 1.1 cm hyperenhancing lesion in the posterior right hepatic lobe. She underwent a PET scan on 11/15/2016 which showed mild hypermetabolism in the 0.8 cm RUL apical lesion with an SUV max of 2.7. There were at least 7 other tiny pulmonary nodules measuring up to 0.5 mm scattered throughout both lungs, all below PET resolution. There was a solitary hypermetabolic non-enlarged right inguinal lymph node that was felt to be reactive.She was not felt to be a candidate for lung resection due to severe COPD, chronic hypoxemia and dyspnea with minimal activity. The dominant lung lesion was small so we decided to follow it. The dominant lesion was stable at 6 x 8 mm  on CT in 11/2016 so we continued following this. 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 The RUL dominant lung lesion was slightly larger on a follow up CT in 09/2017. She had a follow up PET scan on 10/17/2017 that showed three hypermetabolic nodules in the RUL that were increased in size and metabolic activity compared to her prior PET but there was no mediastinal adenopathy. She had a CT guided needle biopsy of the dominant lung lesion that showed non-small cell lung cancer that was adenocarcinoma. She has started Xeloda on 01/07/2018 for her appendiceal adenocarcinoma. A follow up chest CT on 01/21/2018 showed new lymphadenopathy in the right paratracheal and pretracheal region measuring 2 cm and suspicious for metastasis. She continues to do well after starting chemotherapy.            Current Outpatient Medications  Medication Sig Dispense Refill  . albuterol (PROVENTIL HFA;VENTOLIN HFA) 108 (90 BASE) MCG/ACT inhaler Inhale 2 puffs into the lungs every 6 (six) hours as needed. Shortness of Breath    . budesonide (PULMICORT) 180 MCG/ACT inhaler Inhale 2 puffs into the lungs 2 (two) times daily.    . capecitabine (XELODA) 500 MG tablet TAKE 3 TABLETS (1,500 MG TOTAL) BY MOUTH 2 (TWO) TIMES DAILY AFTER A MEAL. TAKE FOR 14 DAYS ON, 7 DAYS OFF, REPEAT EVERY 21 DAYS 84 tablet 0  .  ibuprofen (ADVIL,MOTRIN) 200 MG tablet Take 400 mg by mouth every 6 (six) hours as needed. Pain    . Multiple Vitamin (MULTIVITAMIN WITH MINERALS) TABS Take 1 tablet by mouth daily.     No current facility-administered medications for this visit.      Physical Exam: BP (!) 160/80 (BP Location: Left Arm, Patient Position: Sitting, Cuff Size: Normal)   Pulse 80   Resp 18   Ht 5\' 2"  (1.575 m)   Wt 128 lb 6.4 oz (58.2 kg)   SpO2 95% Comment: RA  BMI 23.48 kg/m  She looks chronically ill but unchanged from  her baseline. In good spirits. Lungs are clear Cardiac exam shows a regular rate and rhythm with normal heart sounds.  Diagnostic Tests:  CLINICAL DATA: Lung cancer.  EXAM: CT CHEST WITH CONTRAST  TECHNIQUE: Multidetector CT imaging of the chest was performed during intravenous contrast administration.  CONTRAST: 75 cc Isovue-300.  COMPARISON: PET 12/17/2016 and CT chest 10/03/2017.  FINDINGS: Cardiovascular: Atherosclerotic calcification of the arterial vasculature including coronary arteries. Pulmonary arteries are enlarged. Left ventricle appears slightly dilated. Heart size is normal. No pericardial effusion.  Mediastinum/Nodes: New mediastinal adenopathy is seen in the prevascular and right paratracheal stations. Largest lymph node is seen in the prevascular space, measuring 2.0 cm in short axis, previously 6 mm. No hilar or axillary adenopathy. Esophagus is grossly unremarkable.  Lungs/Pleura: Biapical pleural-parenchymal scarring. Centrilobular emphysema. Apical segment right upper lobe nodule measures 6 mm, stable. Spiculated nodule along the minor fissure (image 60) measures 8 mm, stable. Additional scattered tiny pulmonary nodules are unchanged as well. Scarring in the lingula and right middle lobe. No pleural fluid. Airway is unremarkable.  Upper Abdomen: 9 mm hyperattenuating lesion in the posterior right hepatic lobe may represent a flash fill hemangioma or perfusion anomaly. Definitive characterization is limited due to size and lack of precontrast imaging. Visualized portions of the liver, gallbladder, adrenal glands, kidneys, spleen, pancreas, stomach and bowel are grossly unremarkable. No upper abdominal adenopathy.  Musculoskeletal: No worrisome lytic or sclerotic lesions. Degenerative changes in the spine.  IMPRESSION: 1. New mediastinal adenopathy, indicative of disease progression. 2. Right lung nodules are stable. 3. Aortic  atherosclerosis (ICD10-170.0). Coronary calcification. 4. Enlarged pulmonary arteries, indicative pulmonary arterial hypertension.   Electronically Signed By: Lorin Picket M.D. 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 Oncology would like to get a biopsy of the mediastinal adenopathy. I think the best procedure to do this and get enough tissue is with mediastinoscopy. I discussed the procedure, alternatives, benefits and risks with the patient and her husband including but not limited to bleeding, infection, injury to mediastinal structures and she understands and agrees to proceed.   Plan:  Mediastinoscopy with lymph node biopsies.   Gaye Pollack, MD [facility] [phone]

## 2018-02-04 NOTE — Anesthesia Postprocedure Evaluation (Signed)
Anesthesia Post Note  Patient: Melissa Golden  Procedure(s) Performed: MEDIASTINOSCOPY (N/A Neck) LYMPH NODE BIOPSY (N/A Neck)     Patient location during evaluation: PACU Anesthesia Type: General Level of consciousness: awake and alert Pain management: pain level controlled Vital Signs Assessment: post-procedure vital signs reviewed and stable Respiratory status: spontaneous breathing, nonlabored ventilation, respiratory function stable and patient connected to nasal cannula oxygen Cardiovascular status: blood pressure returned to baseline and stable Postop Assessment: no apparent nausea or vomiting Anesthetic complications: no    Last Vitals:  Vitals:   02/04/18 1035 02/04/18 1045  BP: (!) 87/75 (!) 106/55  Pulse: 81 81  Resp: (!) 24 (!) 9  Temp:    SpO2: 99% 94%    Last Pain:  Vitals:   02/04/18 1000  PainSc: Asleep                 Diedre Maclellan,W. EDMOND

## 2018-02-04 NOTE — Progress Notes (Signed)
Patient husband inquiring about patient taking PTA medication Xeloda, patient advised needs to have food prior to taking this medication.  Called and spoke with MD Cyndia Bent, patient to be NPO at this time as unsure if patient may need to go to surgery if chest tube drainage is high.  No diet orders at this time and MD aware patient would not be taking Xeloda.

## 2018-02-04 NOTE — Brief Op Note (Signed)
02/04/2018  8:54 AM  PATIENT:  Melissa Golden  71 y.o. female  PRE-OPERATIVE DIAGNOSIS:  mediastinal adenopathy  POST-OPERATIVE DIAGNOSIS:  mediastinal adenopathy  PROCEDURE:  Procedure(s): MEDIASTINOSCOPY (N/A) LYMPH NODE BIOPSY (N/A)  SURGEON:  Surgeon(s) and Role:    * Bartle, Fernande Boyden, MD - Primary  PHYSICIAN ASSISTANT: none  ASSISTANTS: none   ANESTHESIA:   general  EBL:  150 mL   BLOOD ADMINISTERED:none  DRAINS: none   LOCAL MEDICATIONS USED:  NONE  SPECIMEN:  Right paratracheal lymph node  DISPOSITION OF SPECIMEN:  PATHOLOGY  Frozen section showed metastatic carcinoma  COUNTS:  YES  TOURNIQUET:  * No tourniquets in log *  DICTATION: .Note written in EPIC  PLAN OF CARE: Discharge to home after PACU  PATIENT DISPOSITION:  PACU - hemodynamically stable.   Delay start of Pharmacological VTE agent (>24hrs) due to surgical blood loss or risk of bleeding: not applicable

## 2018-02-04 NOTE — Discharge Instructions (Signed)
You may shower.  Resume normal activity tomorrow  No driving today

## 2018-02-04 NOTE — Op Note (Signed)
CARDIOVASCULAR SURGERY OPERATIVE NOTE  02/04/2018  Surgeon:  Gaye Pollack, MD  First Assistant: none   Preoperative Diagnosis:  Mediastinal adenopathy   Postoperative Diagnosis:  Same   Procedure:  1. Mediastinoscopy with lymph node biopsy  Anesthesia:  General Endotracheal   Clinical History/Surgical Indication:  The patient returns today for evaluation of new mediastinal lymphadenopathy. Sheis a 72 year old woman with a 100 pk-year smoking history who was admitted to [facility] at the end of November 2017with acute respiratory failure with hypoxemia. She had a CXR that ruled out pneumonia and she was felt to have an exacerbation of COPD. She was treated with bipap, nebs and oxygen with improvement and she was sent home on oxygen. She had a CTA of the chest on admission which showed no evidence of PE but did show a 0.9 x 0.7 cm spiculated density in the RUL which had increased in size compared to a prior CT on 12/14/2014. There was a small stellate density in the subpleural RUL that was unchanged. There was mild ground glass opacity in the lingula and mild consolidation in the posterior LLL that were felt to be consistent with atelectasis or infiltrate. There was a focus of ground-glass density in the lateral subpleural LUL that was felt to be consistent with infection or inflammation. There was a 1.1 cm hyperenhancing lesion in the posterior right hepatic lobe. She underwent a PET scan on 12/20/2017which showed mild hypermetabolism in the 0.8 cm RUL apical lesion with an SUV max of 2.7. There were at least 7 other tiny pulmonary nodules measuring up to 0.5 mm scattered throughout both lungs, all below PET resolution. There was a solitary hypermetabolic non-enlarged right inguinal lymph node that was felt to be reactive.She was not felt to be a candidate for lung resection due to severe  COPD, chronic hypoxemia and dyspnea with minimal activity. The dominant lung lesion was small so we decided to follow it. The dominant lesion was stable at 6 x 8 mm on CT in 11/2016 so we continued following this. 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 The RUL dominant lung lesion was slightly larger on a follow up CT in 09/2017. She had a follow up PET scan on 10/17/2017 that showed three hypermetabolic nodules in the RUL that were increased in size and metabolic activity compared to her prior PET but there was no mediastinal adenopathy. She had a CT guided needle biopsy of the dominant lung lesion that showed non-small cell lung cancer that was adenocarcinoma. She has started Xeloda on 01/07/2018 for her appendiceal adenocarcinoma. A follow up chest CT on 01/21/2018 showed new lymphadenopathy in the right paratracheal and pretracheal region measuring 2 cm and suspicious for metastasis.    Preparation:  The patient was seen in the preoperative holding area and the correct patient, correct operation were confirmed with the patient after reviewing the medical record and CT scan, PET scan. The consent was signed by me. Preoperative antibiotics were given. The patient was taken back to the operating room and positioned supine on the operating room table. After being placed under general endotracheal anesthesia by the anesthesia team the neck, chest were prepped with betadine soap and solution and draped in the usual sterile manner. A surgical time-out was taken and the correct patient and operative procedure were confirmed with the nursing and anesthesia staff.  Mediastinoscopy:  A roll was placed beneath the shoulders and the neck slightly  extended. The neck and chest were prepped with betadine soap and solution and draped in the usual sterile manner. A 2nd time out was taken and the correct patient and procedure  were confirmed with nursing and anesthesia. A 2 cm incision was made horizontally just above the sternal notch in the natural skin crease. Electrocautery was used to continue down through the subcutaneous tissue. The strap muscles were separated in the midline to expose the trachea. A pretracheal plane was developed bluntly and the mediastinoscope inserted. It was advanced along the anterior tracheal wall. There was a large lymph node mass seen along the right paratracheal region and biopsies were taken and sent for frozen section. Dr. Lyndon Code called the OR and said it showed metastatic carcinoma. Further biopsies were taken. These were sent for permanent pathology. Hemostasis was complete at the end of the procedure. The scope was removed and the strap muscles re-approximated with interrupted 3-0 vicryl sutures. The platysma muscle was re-approximated with 3-0 vicryl suture and the skin with 4-0 vicryl subcuticular suture. Dermabond was applied. The sponge, needle, and instrument counts were correct according to the nurses. The patient was awakened, extubated and transported to the PACU in stable condition.

## 2018-02-04 NOTE — Progress Notes (Signed)
Patient ID: Melissa Golden, female   DOB: 09/14/1946, 72 y.o.   MRN: [id number] EVENING ROUNDS NOTE :     [address]       [place] [address]             [phone]                 Day of Surgery Procedure(s) (LRB): MEDIASTINOSCOPY (N/A) LYMPH NODE BIOPSY (N/A)  Total Length of Stay:  LOS: 0 days  BP 113/60   Pulse 71   Temp 97.7 F (36.5 C) (Oral)   Resp 13   Ht 5\' 2"  (1.575 m)   Wt 128 lb 4.9 oz (58.2 kg)   SpO2 97%   BMI 23.47 kg/m   .Intake/Output      03/10 0701 - 03/11 0700 03/11 0701 - 03/12 0700   I.V. (mL/kg)  975 (16.8)   Other  1200   IV Piggyback  100   Total Intake(mL/kg)  2275 (39.1)   Emesis/NG output  0   Blood  150   Chest Tube  1080   Total Output  1230   Net  +1045        Emesis Occurrence  1 x     . sodium chloride    . sodium chloride 50 mL/hr at 02/04/18 1638  . potassium chloride       Lab Results  Component Value Date   WBC 11.1 (H) 02/04/2018   HGB 8.8 (L) 02/04/2018   HCT 27.6 (L) 02/04/2018   PLT 130 (L) 02/04/2018   GLUCOSE 96 02/01/2018   ALT 19 02/01/2018   AST 28 02/01/2018   NA 139 02/01/2018   K 4.0 02/01/2018   CL 99 (L) 02/01/2018   CREATININE 0.60 02/01/2018   BUN 7 02/01/2018   CO2 28 02/01/2018   INR 0.97 02/01/2018   Total 1100 from chest tube  Last two hours 90 ml from chest tubes Cbc at 1358 8.8 / 27.7    Grace Isaac MD  Beeper [phone] Office [phone] 02/04/2018 5:17 PM

## 2018-02-04 NOTE — Interval H&P Note (Signed)
History and Physical Interval Note:  02/04/2018 7:10 AM  Melissa Golden  has presented today for surgery, with the diagnosis of mediastinal adenopathy  The various methods of treatment have been discussed with the patient and family. After consideration of risks, benefits and other options for treatment, the patient has consented to  Procedure(s): MEDIASTINOSCOPY (N/A) LYMPH NODE BIOPSY (N/A) as a surgical intervention .  The patient's history has been reviewed, patient examined, no change in status, stable for surgery.  I have reviewed the patient's chart and labs.  Questions were answered to the patient's satisfaction.     Gaye Pollack

## 2018-02-04 NOTE — Anesthesia Procedure Notes (Signed)
Procedure Name: Intubation Date/Time: 02/04/2018 7:35 AM Performed by: Imagene Riches, CRNA Pre-anesthesia Checklist: Patient identified, Emergency Drugs available, Suction available and Patient being monitored Patient Re-evaluated:Patient Re-evaluated prior to induction Oxygen Delivery Method: Circle System Utilized Preoxygenation: Pre-oxygenation with 100% oxygen Induction Type: IV induction Ventilation: Mask ventilation without difficulty Laryngoscope Size: Miller and 2 Grade View: Grade I Tube type: Oral Tube size: 7.0 mm Number of attempts: 1 Airway Equipment and Method: Stylet and Oral airway Placement Confirmation: ETT inserted through vocal cords under direct vision,  positive ETCO2 and breath sounds checked- equal and bilateral Secured at: 19 cm Tube secured with: Tape Dental Injury: Teeth and Oropharynx as per pre-operative assessment

## 2018-02-05 ENCOUNTER — Encounter (HOSPITAL_COMMUNITY): Payer: Self-pay | Admitting: Surgery

## 2018-02-05 ENCOUNTER — Inpatient Hospital Stay (HOSPITAL_COMMUNITY): Payer: Medicare Other

## 2018-02-05 ENCOUNTER — Other Ambulatory Visit: Payer: Self-pay

## 2018-02-05 DIAGNOSIS — J90 Pleural effusion, not elsewhere classified: Secondary | ICD-10-CM

## 2018-02-05 LAB — BASIC METABOLIC PANEL
Anion gap: 6 (ref 5–15)
BUN: 13 mg/dL (ref 6–20)
CHLORIDE: 105 mmol/L (ref 101–111)
CO2: 27 mmol/L (ref 22–32)
CREATININE: 0.57 mg/dL (ref 0.44–1.00)
Calcium: 8 mg/dL — ABNORMAL LOW (ref 8.9–10.3)
Glucose, Bld: 128 mg/dL — ABNORMAL HIGH (ref 65–99)
POTASSIUM: 4.7 mmol/L (ref 3.5–5.1)
SODIUM: 138 mmol/L (ref 135–145)

## 2018-02-05 LAB — CBC
HCT: 22 % — ABNORMAL LOW (ref 36.0–46.0)
HEMOGLOBIN: 7.3 g/dL — AB (ref 12.0–15.0)
MCH: 32.7 pg (ref 26.0–34.0)
MCHC: 33.2 g/dL (ref 30.0–36.0)
MCV: 98.7 fL (ref 78.0–100.0)
PLATELETS: 115 10*3/uL — AB (ref 150–400)
RBC: 2.23 MIL/uL — AB (ref 3.87–5.11)
RDW: 13.7 % (ref 11.5–15.5)
WBC: 8.7 10*3/uL (ref 4.0–10.5)

## 2018-02-05 LAB — PREPARE RBC (CROSSMATCH)

## 2018-02-05 MED ORDER — SODIUM CHLORIDE 0.9 % IV SOLN
Freq: Once | INTRAVENOUS | Status: AC
  Administered 2018-02-05: via INTRAVENOUS

## 2018-02-05 MED ORDER — FE FUMARATE-B12-VIT C-FA-IFC PO CAPS
1.0000 | ORAL_CAPSULE | Freq: Three times a day (TID) | ORAL | Status: DC
  Administered 2018-02-05 – 2018-02-08 (×9): 1 via ORAL
  Filled 2018-02-05 (×9): qty 1

## 2018-02-05 MED ORDER — CAPECITABINE 500 MG PO TABS
1500.0000 mg | ORAL_TABLET | Freq: Two times a day (BID) | ORAL | Status: DC
  Filled 2018-02-05 (×3): qty 3

## 2018-02-05 MED ORDER — LEVALBUTEROL HCL 0.63 MG/3ML IN NEBU
0.6300 mg | INHALATION_SOLUTION | Freq: Four times a day (QID) | RESPIRATORY_TRACT | Status: DC
  Administered 2018-02-05 – 2018-02-06 (×6): 0.63 mg via RESPIRATORY_TRACT
  Filled 2018-02-05 (×6): qty 3

## 2018-02-05 NOTE — Progress Notes (Signed)
CT surgery p.m. Rounds  Resting comfortably after an active day Oxygen saturation satisfactory No air leak from chest tube Vital signs stable

## 2018-02-05 NOTE — Progress Notes (Signed)
Prescott Gum made aware of patient's soft MAPs (in the 1s) as well as  HGB 7.3 this morning and no blood given.    Orders for 1 unit PRBC given.  RN will continue to monitor patient.

## 2018-02-05 NOTE — Progress Notes (Signed)
1 Day Post-Op Procedure(s) (LRB): MEDIASTINOSCOPY (N/A) LYMPH NODE BIOPSY (N/A) Subjective: No complaints. Nausea resolved and wants to eat  Objective: Vital signs in last 24 hours: Temp:  [97.7 F (36.5 C)-98.5 F (36.9 C)] 98.5 F (36.9 C) (03/12 0819) Pulse Rate:  [65-103] 69 (03/12 0830) Cardiac Rhythm: Normal sinus rhythm (03/12 0800) Resp:  [8-26] 12 (03/12 0830) BP: (75-126)/(27-85) 88/39 (03/12 0830) SpO2:  [94 %-100 %] 98 % (03/12 0830) Weight:  [58.2 kg (128 lb 4.9 oz)] 58.2 kg (128 lb 4.9 oz) (03/11 1154)  Hemodynamic parameters for last 24 hours:    Intake/Output from previous day: 03/11 0701 - 03/12 0700 In: 3233.3 [P.O.:240; I.V.:1693.3; IV Piggyback:100] Out: 1870 [Urine:500; Blood:150; Chest Tube:1220] Intake/Output this shift: Total I/O In: 179.2 [P.O.:120; I.V.:59.2] Out: 0   General appearance: alert and cooperative Neurologic: intact Heart: regular rate and rhythm, S1, S2 normal, no murmur, click, rub or gallop Lungs: clear to auscultation bilaterally Wound: neck incision ok Chest tube output minimal serosanguinous.  Lab Results: Recent Labs    02/04/18 1358 02/05/18 0221  WBC 11.1* 8.7  HGB 8.8* 7.3*  HCT 27.6* 22.0*  PLT 130* 115*   BMET:  Recent Labs    02/05/18 0221  NA 138  K 4.7  CL 105  CO2 27  GLUCOSE 128*  BUN 13  CREATININE 0.57  CALCIUM 8.0*    PT/INR: No results for input(s): LABPROT, INR in the last 72 hours. ABG No results found for: PHART, HCO3, TCO2, ACIDBASEDEF, O2SAT CBG (last 3)  No results for input(s): GLUCAP in the last 72 hours.  CLINICAL DATA:  Status post mediastinoscopy and lymph node dissection.  EXAM: PORTABLE CHEST 1 VIEW  COMPARISON:  02/04/2018  FINDINGS: Right-sided chest tube in unchanged position. Small right apical pneumothorax. Postsurgical changes in the right lung base. Right lower lobe atelectasis. Left lung is clear. Stable cardiomediastinal silhouette. No acute osseous  abnormality.  IMPRESSION: Right-sided chest tube in satisfactory position with a small persistent right apical pneumothorax.   Electronically Signed   By: Kathreen Devoid   On: 02/05/2018 09:17   Assessment/Plan: S/P Procedure(s) (LRB): MEDIASTINOSCOPY (N/A) LYMPH NODE BIOPSY (N/A)  Postop hemothorax. CXR ok and minimal chest tube output. Will keep tube in today.  Acute postop blood loss anemia: Hgb dropped to 7.3. Will follow for now and start iron.   OOB, IS   LOS: 1 day    Gaye Pollack 02/05/2018

## 2018-02-05 NOTE — Progress Notes (Signed)
Pt ambulated 295 ft on 2 L [place] w/avasys walker. Pt HR increased to 122 regular rate rhythm. Pt now resting in bed and recovered quickly. Will continue to monitor closely. Oswaldo Conroy, RN

## 2018-02-06 ENCOUNTER — Other Ambulatory Visit (HOSPITAL_COMMUNITY): Payer: Medicare Other

## 2018-02-06 ENCOUNTER — Inpatient Hospital Stay (HOSPITAL_COMMUNITY): Payer: Medicare Other

## 2018-02-06 LAB — CBC
HCT: 23.9 % — ABNORMAL LOW (ref 36.0–46.0)
Hemoglobin: 8 g/dL — ABNORMAL LOW (ref 12.0–15.0)
MCH: 32.3 pg (ref 26.0–34.0)
MCHC: 33.5 g/dL (ref 30.0–36.0)
MCV: 96.4 fL (ref 78.0–100.0)
PLATELETS: 98 10*3/uL — AB (ref 150–400)
RBC: 2.48 MIL/uL — AB (ref 3.87–5.11)
RDW: 15.5 % (ref 11.5–15.5)
WBC: 5.9 10*3/uL (ref 4.0–10.5)

## 2018-02-06 MED ORDER — LEVALBUTEROL HCL 0.63 MG/3ML IN NEBU
0.6300 mg | INHALATION_SOLUTION | Freq: Three times a day (TID) | RESPIRATORY_TRACT | Status: DC
  Administered 2018-02-06 – 2018-02-07 (×2): 0.63 mg via RESPIRATORY_TRACT
  Filled 2018-02-06 (×2): qty 3

## 2018-02-06 NOTE — Progress Notes (Signed)
2 Days Post-Op Procedure(s) (LRB): MEDIASTINOSCOPY (N/A) LYMPH NODE BIOPSY (N/A) Subjective: No complaints. Ambulated  Objective: Vital signs in last 24 hours: Temp:  [98.1 F (36.7 C)-98.8 F (37.1 C)] 98.3 F (36.8 C) (03/13 0400) Pulse Rate:  [63-121] 63 (03/13 0700) Cardiac Rhythm: Normal sinus rhythm (03/13 0000) Resp:  [9-27] 11 (03/13 0700) BP: (79-139)/(35-81) 101/43 (03/13 0700) SpO2:  [91 %-100 %] 98 % (03/13 0700) Weight:  [59.5 kg (131 lb 2.8 oz)] 59.5 kg (131 lb 2.8 oz) (03/13 0430)  Hemodynamic parameters for last 24 hours:    Intake/Output from previous day: 03/12 0701 - 03/13 0700 In: 908.3 [P.O.:360; I.V.:59.2; Blood:489.2] Out: 510 [Urine:350; Chest Tube:160] Intake/Output this shift: No intake/output data recorded.  General appearance: alert and cooperative Heart: regular rate and rhythm, S1, S2 normal, no murmur, click, rub or gallop Lungs: clear to auscultation bilaterally chest tube output low.   Lab Results: Recent Labs    02/05/18 0221 02/06/18 0359  WBC 8.7 5.9  HGB 7.3* 8.0*  HCT 22.0* 23.9*  PLT 115* 98*   BMET:  Recent Labs    02/05/18 0221  NA 138  K 4.7  CL 105  CO2 27  GLUCOSE 128*  BUN 13  CREATININE 0.57  CALCIUM 8.0*    PT/INR: No results for input(s): LABPROT, INR in the last 72 hours. ABG No results found for: PHART, HCO3, TCO2, ACIDBASEDEF, O2SAT CBG (last 3)  No results for input(s): GLUCAP in the last 72 hours.  CXR: looks good. No significant ptx or effusion. Assessment/Plan: S/P Procedure(s) (LRB): MEDIASTINOSCOPY (N/A) LYMPH NODE BIOPSY (N/A)  Postop hemothorax with acute blood loss anemia. Transfused 1 unit PRBC's overnight due to hypotension. Hgb improved this am to 8.0. Continue iron and repeat in am. CT output 80 cc overnight. Will put to water seal.  Continue ambulation, IS.  Pathology pending.   LOS: 2 days    Melissa Golden 02/06/2018

## 2018-02-06 NOTE — Progress Notes (Signed)
[facility] BRIEF SICU PROGRESS NOTE  2 Days Post-Op  S/P Procedure(s) (LRB): MEDIASTINOSCOPY (N/A) LYMPH NODE BIOPSY (N/A)   Stable day  Plan: Continue current plan  Rexene Alberts, MD 02/06/2018 8:37 PM

## 2018-02-07 ENCOUNTER — Encounter: Payer: Self-pay | Admitting: *Deleted

## 2018-02-07 ENCOUNTER — Other Ambulatory Visit: Payer: Self-pay | Admitting: *Deleted

## 2018-02-07 ENCOUNTER — Inpatient Hospital Stay (HOSPITAL_COMMUNITY): Payer: Medicare Other

## 2018-02-07 LAB — CBC
HEMATOCRIT: 25.8 % — AB (ref 36.0–46.0)
HEMOGLOBIN: 8.5 g/dL — AB (ref 12.0–15.0)
MCH: 31.5 pg (ref 26.0–34.0)
MCHC: 32.9 g/dL (ref 30.0–36.0)
MCV: 95.6 fL (ref 78.0–100.0)
PLATELETS: 117 10*3/uL — AB (ref 150–400)
RBC: 2.7 MIL/uL — AB (ref 3.87–5.11)
RDW: 15.5 % (ref 11.5–15.5)
WBC: 5 10*3/uL (ref 4.0–10.5)

## 2018-02-07 NOTE — Progress Notes (Signed)
Patient ambulated from [facility] to new room 4E05. All belongings including cell phone and charger brought with patient to room. SCDs with patient. Recieving RN at bedside.

## 2018-02-07 NOTE — Progress Notes (Signed)
Pt received from [facility]. VSS. CHG complete. Telemetry applied. Pt oriented to room. Pt resting in recliner. Call light in reach. Will continue to monitor.  Clyde Canterbury, RN

## 2018-02-07 NOTE — Progress Notes (Signed)
Chest tube removed as per order. Patient tolerated procedure well. No adverse events noted. Will obtain CXR per MD order. Will continue to monitor.

## 2018-02-07 NOTE — Progress Notes (Signed)
3 Days Post-Op Procedure(s) (LRB): MEDIASTINOSCOPY (N/A) LYMPH NODE BIOPSY (N/A) Subjective: Sore but otherwise ok  Objective: Vital signs in last 24 hours: Temp:  [97.4 F (36.3 C)-98.2 F (36.8 C)] 98.2 F (36.8 C) (03/14 0759) Pulse Rate:  [57-85] 57 (03/14 0800) Cardiac Rhythm: Normal sinus rhythm (03/14 0800) Resp:  [9-18] 13 (03/14 0800) BP: (88-147)/(45-62) 122/53 (03/14 0800) SpO2:  [93 %-100 %] 100 % (03/14 0820)  Hemodynamic parameters for last 24 hours:    Intake/Output from previous day: 03/13 0701 - 03/14 0700 In: 420 [P.O.:420] Out: 440 [Urine:350; Chest Tube:90] Intake/Output this shift: No intake/output data recorded.  General appearance: alert and cooperative Heart: regular rate and rhythm, S1, S2 normal, no murmur, click, rub or gallop Lungs: clear to auscultation bilaterally  Lab Results: Recent Labs    02/06/18 0359 02/07/18 0224  WBC 5.9 5.0  HGB 8.0* 8.5*  HCT 23.9* 25.8*  PLT 98* 117*   BMET:  Recent Labs    02/05/18 0221  NA 138  K 4.7  CL 105  CO2 27  GLUCOSE 128*  BUN 13  CREATININE 0.57  CALCIUM 8.0*    PT/INR: No results for input(s): LABPROT, INR in the last 72 hours. ABG No results found for: PHART, HCO3, TCO2, ACIDBASEDEF, O2SAT CBG (last 3)  No results for input(s): GLUCAP in the last 72 hours.  Assessment/Plan: S/P Procedure(s) (LRB): MEDIASTINOSCOPY (N/A) LYMPH NODE BIOPSY (N/A)  Postop hemothorax with acute blood loss anemia: minimal chest tube output past 24 hrs with stable Hgb that is up slightly. Will DC chest tube. Repeat CXR after removal and in am. She can go home tomorrow if no changes.Transfer to 4E today.   LOS: 3 days    Gaye Pollack 02/07/2018

## 2018-02-07 NOTE — Discharge Summary (Signed)
[address]       [place] [address]             [phone]      Physician Discharge Summary  Patient ID: Melissa Golden MRN: [id number] DOB/AGE: 1946-07-24 72 y.o.  Admit date: 02/04/2018 Discharge date: 02/08/2018  Admission Diagnoses: Patient Active Problem List   Diagnosis Date Noted  . Hemothorax on right 02/04/2018  . Malignant neoplasm of right upper lobe of lung ([place]) 01/10/2018    Discharge Diagnoses:  Active Problems:   Hemothorax on right   Discharged Condition: good  HPI:   The patient returns today for evaluation of new mediastinal lymphadenopathy. Sheis a 72 year old woman with a 100 pk-year smoking history who was admitted to [facility] at the end of November 2017with acute respiratory failure with hypoxemia. She had a CXR that ruled out pneumonia and she was felt to have an exacerbation of COPD. She was treated with bipap, nebs and oxygen with improvement and she was sent home on oxygen. She had a CTA of the chest on admission which showed no evidence of PE but did show a 0.9 x 0.7 cm spiculated density in the RUL which had increased in size compared to a prior CT on 12/14/2014. There was a small stellate density in the subpleural RUL that was unchanged. There was mild ground glass opacity in the lingula and mild consolidation in the posterior LLL that were felt to be consistent with atelectasis or infiltrate. There was a focus of ground-glass density in the lateral subpleural LUL that was felt to be consistent with infection or inflammation. There was a 1.1 cm hyperenhancing lesion in the posterior right hepatic lobe. She underwent a PET scan on 12/20/2017which showed mild hypermetabolism in the 0.8 cm RUL apical lesion with an SUV max of 2.7. There were at least 7 other tiny pulmonary nodules measuring up to 0.5 mm scattered throughout both lungs, all below PET resolution. There was a solitary hypermetabolic non-enlarged right  inguinal lymph node that was felt to be reactive.She was not felt to be a candidate for lung resection due to severe COPD, chronic hypoxemia and dyspnea with minimal activity. The dominant lung lesion was small so we decided to follow it. The dominant lesion was stable at 6 x 8 mm on CT in 11/2016 so we continued following this. 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 The RUL dominant lung lesion was slightly larger on a follow up CT in 09/2017. She had a follow up PET scan on 10/17/2017 that showed three hypermetabolic nodules in the RUL that were increased in size and metabolic activity compared to her prior PET but there was no mediastinal adenopathy. She had a CT guided needle biopsy of the dominant lung lesion that showed non-small cell lung cancer that was adenocarcinoma. She has started Xeloda on 01/07/2018 for her appendiceal adenocarcinoma. A follow up chest CT on 01/21/2018 showed new lymphadenopathy in the right paratracheal and pretracheal region measuring 2 cm and suspicious for metastasis. She continues to do well after starting chemotherapy.   Hospital Course:   On February 04, 2018 Melissa Golden underwent a  mediastinoscopy with lymph node biopsy.  She tolerated the procedure well and was transferred to the ICU.  She was extubated in a timely manner.  Postop day 1 she had 1100 cc from her chest tube.  It was left in place.  She did have some expected acute postoperative blood loss anemia.  We started her on iron supplementation.  Postop day 2 we transfuse 1 unit of packed red blood cells overnight due to hypotension.  Her chest tube put out 80 cc overnight therefore we switched her to waterseal.  Her pathology was pending at this time.  Postop day 3 she had minimal chest tube output.  We discontinued her chest tube at this time.  We plan to get a chest x-ray the following morning.  Today her x-ray is stable  and she is ready for discharge home.  Her incisions are healing well, she is tolerating room air, and she is ambulating with limited assistance. She will get her chest tube sutures removed when she follows up in our office.   Consults: None  Significant Diagnostic Studies:   CLINICAL DATA:  Chest tube removal.  EXAM: PORTABLE CHEST 1 VIEW  COMPARISON:  Yesterday  FINDINGS: Right chest tube is been removed. Very slight increase in small right pneumothorax, well less than 5%. Minimal basilar atelectasis.  IMPRESSION: Right chest tube removed. Very minimal increase in small right pneumothorax, well under 5%. Mild basilar atelectasis.   Electronically Signed   By: Nelson Chimes M.D.   On: 02/07/2018 10:55   Treatments:   CARDIOVASCULAR SURGERY OPERATIVE NOTE  02/04/2018  Surgeon:  Gaye Pollack, MD  First Assistant: none   Preoperative Diagnosis:  Mediastinal adenopathy   Postoperative Diagnosis:  Same   Procedure:  1. Mediastinoscopy with lymph node biopsy  Anesthesia:  General Endotracheal    Discharge Exam: Blood pressure (!) 125/59, pulse 88, temperature 98.4 F (36.9 C), temperature source Oral, resp. rate 15, height 5\' 2"  (1.575 m), weight 128 lb 11.2 oz (58.4 kg), SpO2 98 %.   General appearance: alert, cooperative and no distress Heart: NSR without some irregularity  Lungs: clear to auscultation bilaterally Abdomen: soft, non-tender; bowel sounds normal; no masses,  no organomegaly Extremities: extremities normal, atraumatic, no cyanosis or edema Wound: clean and dry    Disposition: 01-Home or Self Care   Allergies as of 02/08/2018   No Known Allergies     Medication List    TAKE these medications   albuterol 108 (90 Base) MCG/ACT inhaler Commonly known as:  PROVENTIL HFA;VENTOLIN HFA Inhale 2 puffs into the lungs every 6 (six) hours as needed. Shortness of Breath   budesonide 180 MCG/ACT inhaler Commonly known as:   PULMICORT Inhale 2 puffs into the lungs 2 (two) times daily.   capecitabine 500 MG tablet Commonly known as:  XELODA TAKE 3 TABLETS (1,500 MG TOTAL) BY MOUTH 2 (TWO) TIMES DAILY AFTER A MEAL. TAKE FOR 14 DAYS ON, 7 DAYS OFF, REPEAT EVERY 21 DAYS   ibuprofen 200 MG tablet Commonly known as:  ADVIL,MOTRIN Take 400 mg by mouth every 6 (six) hours as needed. Pain   multivitamin with minerals Tabs tablet Take 1 tablet by mouth daily.   oxyCODONE 5 MG immediate release tablet Commonly known as:  ROXICODONE Take 1 tablet (5 mg total) by mouth every 8 (eight) hours as needed.      Follow-up Information    Gaye Pollack, MD Follow up.   Specialty:  Cardiothoracic Surgery Why:  Your appointment is on 02/14/2018 at 2:30pm with Dr. Cyndia Bent for a wound check.  Contact information: [address] [place] [address] [phone]        Rory Percy, MD. Call in 1 day(s).   Specialty:  Family Medicine Contact information: [place] [address] [phone]        Ladell Pier, MD Follow up.   Specialty:  Oncology Why:  Your appointment is on 02/12/2018 at 8:30am. Please bring your medication list.   Contact information: [place] [address] [phone]           Signed: Elgie Collard 02/08/2018, 8:21 AM

## 2018-02-07 NOTE — Progress Notes (Signed)
Per Dr. Benay Spice, I requested foundation one and PDL 1 on [name]'s recent pathology.

## 2018-02-08 ENCOUNTER — Inpatient Hospital Stay (HOSPITAL_COMMUNITY): Payer: Medicare Other

## 2018-02-08 LAB — CBC
HEMATOCRIT: 27.7 % — AB (ref 36.0–46.0)
HEMOGLOBIN: 9.1 g/dL — AB (ref 12.0–15.0)
MCH: 31.6 pg (ref 26.0–34.0)
MCHC: 32.9 g/dL (ref 30.0–36.0)
MCV: 96.2 fL (ref 78.0–100.0)
PLATELETS: 147 10*3/uL — AB (ref 150–400)
RBC: 2.88 MIL/uL — AB (ref 3.87–5.11)
RDW: 15.1 % (ref 11.5–15.5)
WBC: 5.6 10*3/uL (ref 4.0–10.5)

## 2018-02-08 MED ORDER — OXYCODONE HCL 5 MG PO TABS: 5.0000 mg | ORAL_TABLET | Freq: Three times a day (TID) | ORAL | 0 refills | Status: DC | PRN

## 2018-02-08 NOTE — Progress Notes (Addendum)
      [address]       [place] [address]             [phone]      4 Days Post-Op Procedure(s) (LRB): MEDIASTINOSCOPY (N/A) LYMPH NODE BIOPSY (N/A) Subjective: Feels good this morning. Asking when she will be going home.   Objective: Vital signs in last 24 hours: Temp:  [98 F (36.7 C)-99.5 F (37.5 C)] 98.4 F (36.9 C) (03/15 0353) Pulse Rate:  [44-88] 88 (03/14 2015) Cardiac Rhythm: Normal sinus rhythm (03/15 0700) Resp:  [11-17] 15 (03/14 2015) BP: (123-148)/(59-82) 125/59 (03/15 0353) SpO2:  [96 %-100 %] 98 % (03/14 2100) Weight:  [128 lb 11.2 oz (58.4 kg)] 128 lb 11.2 oz (58.4 kg) (03/15 0214)     Intake/Output from previous day: 03/14 0701 - 03/15 0700 In: 780 [P.O.:780] Out: 10 [Chest Tube:10] Intake/Output this shift: No intake/output data recorded.  General appearance: alert, cooperative and no distress Heart: NSR without some irregularity  Lungs: clear to auscultation bilaterally Abdomen: soft, non-tender; bowel sounds normal; no masses,  no organomegaly Extremities: extremities normal, atraumatic, no cyanosis or edema Wound: clean and dry  Lab Results: Recent Labs    02/07/18 0224 02/08/18 0257  WBC 5.0 5.6  HGB 8.5* 9.1*  HCT 25.8* 27.7*  PLT 117* 147*   BMET: No results for input(s): NA, K, CL, CO2, GLUCOSE, BUN, CREATININE, CALCIUM in the last 72 hours.  PT/INR: No results for input(s): LABPROT, INR in the last 72 hours. ABG No results found for: PHART, HCO3, TCO2, ACIDBASEDEF, O2SAT CBG (last 3)  No results for input(s): GLUCAP in the last 72 hours.  Assessment/Plan: S/P Procedure(s) (LRB): MEDIASTINOSCOPY (N/A) LYMPH NODE BIOPSY (N/A)  1. CV-NSR in the 60s. BP well controlled.  2. Remains on 2L [place]. CXR appears stable with no pneumothorax. Tiny right pleural effusion.  3. Renal-creatinine okay. Making good urine.  4. H and H stable, trending up. 9.1/27.7 today.  5. Endo-blood glucose level well controlled.    Plan: On home oxygen already. Husband will bring home tank today.  Discharge today.    LOS: 4 days    Elgie Collard 02/08/2018  Chart reviewed, patient examined, agree with above. She feels better. Hgb increasing. CXR shows small right effusion that looks about the same as yesterday given the different x-ray technique. She can go home today and will need a followup CXR in the office and I will remove the suture at that time. She has follow up with Dr. Benay Spice next week. Pathology of mediastinal lymph node biopsies shows adenocarcinoma of lung origin. Discussed with patient.

## 2018-02-08 NOTE — Progress Notes (Signed)
D/c instructions given to pt and husband. Prescription for oxycodone given. Medication that held in main pharmacy was returned to pt. Husband brought home O2 for ride home.   Clyde Canterbury, RN

## 2018-02-08 NOTE — Consult Note (Signed)
            [facility] CM Primary Care Navigator  02/08/2018  Melissa Golden [birth year] [id number]   Went to seepatient at the bedsideto identify possible discharge needs but she was alreadydischargedper staff report.  Patient was discharged hometoday.  PerMD note,patient was admitted for mediastinoscopy with lymph node biopsy.  Primary care provider's office is listed as providing transition of care (TOC).   Patient has discharge instruction to follow-up with primary care provider (Dr. Rory Percy) in 1 day;  follow-up with oncology on 02/12/18 and cardiothoracic surgery on 02/14/18.   For additional questions please contact:  Edwena Felty A. Caitlin Ainley, BSN, RN-BC [facility] PRIMARY CARE Navigator Cell: [phone]

## 2018-02-08 NOTE — Care Management Note (Signed)
Case Management Note Marvetta Gibbons RN, BSN Unit 4E-Case Manager [phone]  Patient Details  Name: Melissa Golden MRN: [id number] Date of Birth: 1946-06-01  Subjective/Objective:  Pt admitted s/p lymph node bx with post procedure pntx- chest placed                  Action/Plan: PTA pt lived at home with spouse has home 02 with baseline 3L-  Chest tube has been removed and pt stable for d/c home today- spouse to bring portable 02 tank for transport home- no CM needs noted for transition home.   Expected Discharge Date:  02/08/18               Expected Discharge Plan:  Home/Self Care  In-House Referral:  NA  Discharge planning Services  CM Consult  Post Acute Care Choice:  NA Choice offered to:  NA  DME Arranged:    DME Agency:     HH Arranged:    HH Agency:     Status of Service:  Completed, signed off  If discussed at [place] of Stay Meetings, dates discussed:    Discharge Disposition: home/self care   Additional Comments:  Dawayne Patricia, RN 02/08/2018, 10:03 AM

## 2018-02-09 LAB — TYPE AND SCREEN
ABO/RH(D): A POS
Antibody Screen: NEGATIVE
Unit division: 0
Unit division: 0

## 2018-02-09 LAB — BPAM RBC
Blood Product Expiration Date: 201903282359
Blood Product Expiration Date: 201903282359
ISSUE DATE / TIME: 201903122332
UNIT TYPE AND RH: 6200
UNIT TYPE AND RH: 6200

## 2018-02-12 ENCOUNTER — Other Ambulatory Visit: Payer: Self-pay | Admitting: *Deleted

## 2018-02-12 ENCOUNTER — Encounter: Payer: Self-pay | Admitting: Radiation Oncology

## 2018-02-12 ENCOUNTER — Inpatient Hospital Stay (HOSPITAL_BASED_OUTPATIENT_CLINIC_OR_DEPARTMENT_OTHER): Payer: Medicare Other | Admitting: Oncology

## 2018-02-12 ENCOUNTER — Telehealth: Payer: Self-pay | Admitting: Oncology

## 2018-02-12 ENCOUNTER — Inpatient Hospital Stay: Payer: Medicare Other | Attending: Nurse Practitioner

## 2018-02-12 VITALS — BP 135/78 | HR 68 | Temp 98.6°F | Resp 24 | Ht 62.0 in | Wt 128.2 lb

## 2018-02-12 DIAGNOSIS — C771 Secondary and unspecified malignant neoplasm of intrathoracic lymph nodes: Secondary | ICD-10-CM | POA: Diagnosis not present

## 2018-02-12 DIAGNOSIS — C3411 Malignant neoplasm of upper lobe, right bronchus or lung: Secondary | ICD-10-CM | POA: Diagnosis not present

## 2018-02-12 DIAGNOSIS — C7A020 Malignant carcinoid tumor of the appendix: Secondary | ICD-10-CM | POA: Diagnosis not present

## 2018-02-12 DIAGNOSIS — Z79899 Other long term (current) drug therapy: Secondary | ICD-10-CM | POA: Insufficient documentation

## 2018-02-12 DIAGNOSIS — J449 Chronic obstructive pulmonary disease, unspecified: Secondary | ICD-10-CM | POA: Insufficient documentation

## 2018-02-12 DIAGNOSIS — C181 Malignant neoplasm of appendix: Secondary | ICD-10-CM

## 2018-02-12 LAB — CBC WITH DIFFERENTIAL (CANCER CENTER ONLY)
BASOS PCT: 0 %
Basophils Absolute: 0 10*3/uL (ref 0.0–0.1)
Eosinophils Absolute: 0.2 10*3/uL (ref 0.0–0.5)
Eosinophils Relative: 3 %
HEMATOCRIT: 32.2 % — AB (ref 34.8–46.6)
HEMOGLOBIN: 10.3 g/dL — AB (ref 11.6–15.9)
LYMPHS ABS: 1.1 10*3/uL (ref 0.9–3.3)
LYMPHS PCT: 18 %
MCH: 32.3 pg (ref 25.1–34.0)
MCHC: 32 g/dL (ref 31.5–36.0)
MCV: 100.9 fL (ref 79.5–101.0)
MONO ABS: 0.9 10*3/uL (ref 0.1–0.9)
MONOS PCT: 14 %
NEUTROS ABS: 4.1 10*3/uL (ref 1.5–6.5)
NEUTROS PCT: 65 %
Platelet Count: 193 10*3/uL (ref 145–400)
RBC: 3.19 MIL/uL — ABNORMAL LOW (ref 3.70–5.45)
RDW: 15.8 % — AB (ref 11.2–14.5)
WBC Count: 6.4 10*3/uL (ref 3.9–10.3)

## 2018-02-12 LAB — CMP (CANCER CENTER ONLY)
ALBUMIN: 3.4 g/dL — AB (ref 3.5–5.0)
ALK PHOS: 74 U/L (ref 40–150)
ALT: 14 U/L (ref 0–55)
ANION GAP: 8 (ref 3–11)
AST: 19 U/L (ref 5–34)
BUN: 7 mg/dL (ref 7–26)
CALCIUM: 9.6 mg/dL (ref 8.4–10.4)
CO2: 32 mmol/L — AB (ref 22–29)
Chloride: 103 mmol/L (ref 98–109)
Creatinine: 0.64 mg/dL (ref 0.60–1.10)
GFR, Estimated: >60 mL/min (ref >60)
GLUCOSE: 118 mg/dL (ref 70–140)
POTASSIUM: 4.1 mmol/L (ref 3.5–5.1)
SODIUM: 143 mmol/L (ref 136–145)
Total Bilirubin: 1.1 mg/dL (ref 0.2–1.2)
Total Protein: 6.2 g/dL — ABNORMAL LOW (ref 6.4–8.3)

## 2018-02-12 MED ORDER — PROCHLORPERAZINE MALEATE 5 MG PO TABS: 5.0000 mg | ORAL_TABLET | Freq: Four times a day (QID) | ORAL | 1 refills | Status: DC | PRN

## 2018-02-12 MED ORDER — DEXAMETHASONE 4 MG PO TABS: ORAL_TABLET | ORAL | 0 refills | Status: DC

## 2018-02-12 NOTE — Telephone Encounter (Signed)
Scheduled appt per 3/19 los - Gave patient AVS and calender per los. Unable to schedule 4/3 due to cap - scheduled appt for 4/4

## 2018-02-12 NOTE — Progress Notes (Signed)
START ON PATHWAY REGIMEN - Non-Small Cell Lung     Administer weekly:     Paclitaxel      Carboplatin   **Always confirm dose/schedule in your pharmacy ordering system**    Patient Characteristics: Stage III - Unresectable, PS = 0, 1 AJCC T Category: Staged < 8th Ed. Current Disease Status: No Distant Mets or Local Recurrence AJCC N Category: Staged < 8th Ed. AJCC M Category: Staged < 8th Ed. AJCC 8 Stage Grouping: Staged < 8th Ed. Performance Status: PS = 0, 1 Intent of Therapy: Curative Intent, Discussed with Patient

## 2018-02-12 NOTE — Progress Notes (Signed)
[facility] OFFICE PROGRESS NOTE   Diagnosis: Non-small cell lung cancer, goblet cell carcinoid tumor  INTERVAL HISTORY:   Melissa Golden returns as scheduled.  She underwent a mediastinoscopy for biopsy of a right paratracheal lymph node on 02/04/2018.  Surgery was complicated by development of a hemothorax requiring placement of a chest tube.  The chest tube was removed and she was discharged home on 02/07/2018. The pathology from biopsy of a right paratracheal lymph node revealed metastatic adenocarcinoma.  The tumor still seen to positive for Napsin A and TTF-1.  The pathology was felt to be consistent with adenocarcinoma of lung primary.  She has recovered from surgery.  Objective:  Vital signs in last 24 hours:  Blood pressure 135/78, pulse 68, temperature 98.6 F (37 C), temperature source Oral, resp. rate (!) 24, height '5\' 2"'$  (1.575 m), weight 128 lb 3.2 oz (58.2 kg), SpO2 100 %.    HEENT: No thrush or ulcers Resp: Distant breath sounds, no respiratory distress Cardio: Irregular GI: No hepatomegaly, no mass, nontender Vascular: No leg edema   Portacath/PICC-without erythema  Lab Results:  Lab Results  Component Value Date   WBC 6.4 02/12/2018   HGB 9.1 (L) 02/08/2018   HCT 32.2 (L) 02/12/2018   MCV 100.9 02/12/2018   PLT 193 02/12/2018   NEUTROABS 4.1 02/12/2018    CMP     Component Value Date/Time   NA 143 02/12/2018 0808   K 4.1 02/12/2018 0808   CL 103 02/12/2018 0808   CO2 32 (H) 02/12/2018 0808   GLUCOSE 118 02/12/2018 0808   BUN 7 02/12/2018 0808   CREATININE 0.64 02/12/2018 0808   CALCIUM 9.6 02/12/2018 0808   PROT 6.2 (L) 02/12/2018 0808   ALBUMIN 3.4 (L) 02/12/2018 0808   AST 19 02/12/2018 0808   ALT 14 02/12/2018 0808   ALKPHOS 74 02/12/2018 0808   BILITOT 1.1 02/12/2018 0808   GFRNONAA >60 02/12/2018 0808   GFRAA >60 02/12/2018 0808    Medications: I have reviewed the patient's current  medications.   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  Status postlaparoscopic appendectomy 09/12/2017. Postoperative course complicated by an ileus requiring TPN.   Final pathology of the appendix-5 cm adenocarcinoma/goblet cell carcinoid. The tumor extended through the muscularis propria into subserosal soft tissue and mesoappendix and focally to the serosal surface. Lymphovascular space invasion identified. Perineural invasion identified. Associated acute appendicitis with perforation.  10/15/2017 CEA 1.8  11/22/2017 chromogranin A-3  Status postlaparoscopic right hemicolectomy by Dr. Janace Litten 12/07/2017. No carcinomatosis or liver metastases were seen.   Final pathologyshowed metastatic adenocarcinoma/goblet cell carcinoid in 3 of 15 pericolic lymph nodes; maximum size of largest metastasis 0.2 cm; positive for extracapsular extension. Benign terminal ileum with fibrous serosal adhesions, negative for dysplasia or carcinoma; surgically absent appendix; appendectomy site with postsurgical changes and fibrous serosal adhesions, negative for carcinoma; proximal and distal mucosal margins negative for dysplasia or carcinoma; mesenteric resection margin negative for carcinoma.  Cycle 1 adjuvant Xeloda 01/07/2018  Cycle 2 adjuvant Xeloda 01/28/2018 2. Non-small cell lung cancer  PET EUMP53/61/4431-5 hypermetabolic right upper lobe pulmonary nodules increased in size and metabolic activity. No mediastinal nodal metastasis. 4 mm right upper lobe nodule with metabolic activity, SUV 3.6. A third right upper lobe nodule measures 4 mm and has faint metabolic activity. No clear evidence of malignancy in the abdomen or pelvis. Mild  activity along the ventral abdominal surgical scar. Scattered activity in small  bowel without focal lesion on CT.  CT biopsy right upper lobe nodule 11/02/2017 showed non-small cell carcinoma; malignant cells positive for TTF-1 and negative for p63. PD-L1 95%.  Chest CT 01/21/2018-new mediastinal adenopathy seen in the prevascular and right paratracheal stations.  Largest lymph node is seen in the prevascular space measuring 2.0 cm, previously 6 mm.  No hilar or axillary adenopathy.  Right lung nodules are stable.  Biopsy of a right paratracheal lymph node on 02/04/2018-metastatic adenocarcinoma consistent with a lung primary 3. COPD  Disposition:  [name] appears stable.  The mediastinal lymph node biopsy confirms non-small cell lung cancer.  We will request molecular and PD1 testing on the lymph node biopsy.  She appears to have locally advanced non-small cell lung cancer.  I discussed treatment options with Ms. Georg and her husband.  She has several hypermetabolic right upper lobe pulmonary nodules that may represent primary tumors or metastases.  I will discussed the case with radiation oncology.  My initial impression is to recommend concurrent radiation and Taxol/carboplatin chemotherapy.  This will be followed by observation or a course of immunotherapy.  Adjuvant treatment for the goblet cell carcinoid tumor will be placed on hold.  I feel the most likely life limiting diagnosis in her case is the non-small cell lung cancer.  We reviewed the potential toxicities associated with the Taxol/carboplatin regimen including the chance for alopecia, hematologic toxicity, neuropathy, bone pain, and an allergic reaction.  We also discussed the possibility of esophagitis with chemotherapy and chest radiation.  She agrees to proceed.  She is scheduled for a radiation oncology consult and simulation next week.  The plan is to begin combined modality therapy during the week of 02/25/2018.  40 minutes were spent with the patient today.  The majority of the time was used for  counseling and coordination of care.  Betsy Coder, MD  02/12/2018  8:50 AM

## 2018-02-13 ENCOUNTER — Ambulatory Visit (INDEPENDENT_AMBULATORY_CARE_PROVIDER_SITE_OTHER): Payer: Medicare Other | Admitting: Surgery

## 2018-02-13 ENCOUNTER — Ambulatory Visit
Admission: RE | Admit: 2018-02-13 | Discharge: 2018-02-13 | Disposition: A | Discharge status: Home / Self Care | Payer: Medicare Other | Source: Ambulatory Visit | Attending: Surgery | Admitting: Surgery

## 2018-02-13 ENCOUNTER — Encounter: Payer: Self-pay | Admitting: Surgery

## 2018-02-13 VITALS — BP 145/67 | HR 63 | Ht 62.0 in | Wt 128.0 lb

## 2018-02-13 DIAGNOSIS — C3411 Malignant neoplasm of upper lobe, right bronchus or lung: Secondary | ICD-10-CM

## 2018-02-13 DIAGNOSIS — R911 Solitary pulmonary nodule: Secondary | ICD-10-CM | POA: Diagnosis not present

## 2018-02-13 NOTE — Progress Notes (Signed)
HPI:  The patient returns today for follow-up status post mediastinoscopy with lymph node biopsy on 02/04/2018.  This was complicated by development of postoperative right hemothorax requiring chest tube drainage and monitoring in the intensive care unit.  The chest tube was removed after a couple days and she was discharged home.  Since going home she said that she has had some pain around the right rib cage where the chest tube was inserted.  She is still taking oxycodone IR twice daily.  She ran out of that yesterday.  She is on chronic oxygen at home and feels that her breathing is at baseline.   Current Outpatient Medications  Medication Sig Dispense Refill  . albuterol (PROVENTIL HFA;VENTOLIN HFA) 108 (90 BASE) MCG/ACT inhaler Inhale 2 puffs into the lungs every 6 (six) hours as needed. Shortness of Breath    . budesonide (PULMICORT) 180 MCG/ACT inhaler Inhale 2 puffs into the lungs 2 (two) times daily.    . capecitabine (XELODA) 500 MG tablet TAKE 3 TABLETS (1,500 MG TOTAL) BY MOUTH 2 (TWO) TIMES DAILY AFTER A MEAL. TAKE FOR 14 DAYS ON, 7 DAYS OFF, REPEAT EVERY 21 DAYS 84 tablet 0  . dexamethasone (DECADRON) 4 MG tablet Take 2.5 tablets (10mg ) at bedtime the night before chemo and 10mg  at 6AM day of first chemo. 5 tablet 0  . ibuprofen (ADVIL,MOTRIN) 200 MG tablet Take 400 mg by mouth every 6 (six) hours as needed. Pain    . Multiple Vitamin (MULTIVITAMIN WITH MINERALS) TABS Take 1 tablet by mouth daily.    Marland Kitchen oxyCODONE (ROXICODONE) 5 MG immediate release tablet Take 1 tablet (5 mg total) by mouth every 8 (eight) hours as needed. 10 tablet 0  . prochlorperazine (COMPAZINE) 5 MG tablet Take 1 tablet (5 mg total) by mouth every 6 (six) hours as needed for nausea or vomiting. 30 tablet 1   No current facility-administered medications for this visit.      Physical Exam:  BP (!) 145/67   Pulse 63   Ht 5\' 2"  (1.575 m)   Wt 128 lb (58.1 kg)   SpO2 93% Comment: RA  BMI 23.41 kg/m    She looks chronically ill but unchanged from her baseline. The mediastinoscopy incision is healing well. Cardiac exam shows a regular rate and rhythm with normal heart sounds. Lung exam reveals decreased breath sounds throughout. The right chest tube suture is intact.  The site does not look healed yet.  Diagnostic Tests:  CLINICAL DATA:  History of right lung neoplasm involving the right upper lobe, some shortness of breath, also history of colon carcinoma  EXAM: CHEST - 2 VIEW  COMPARISON:  Chest x-ray 02/08/2018 and CT chest of 01/21/2018  FINDINGS: Slight fullness of the right superior paratracheal region may be due to previously demonstrated mediastinal adenopathy by CT. There may be a small nodule in the right mid lung, but the small nodules noted by CT in the right lung are not well seen by chest x-ray. Also there is a right pleural effusion present which has slightly increased in volume. Mild left basilar linear atelectasis remains. Otherwise mediastinal and hilar contours are unchanged and the heart is within normal limits in size. No bony abnormality is noted.  IMPRESSION: 1. Slight increase in volume of right pleural effusion. 2. No change in somewhat prominent right paratracheal soft tissue superiorly consistent with adenopathy by CT the chest. 3. Small nodule in the right mid lung as noted by recent CT.  Electronically Signed   By: Ivar Drape M.D.   On: 02/13/2018 09:30  Impression:  She is stable following mediastinoscopy and lymph node biopsy complicated by postoperative hemothorax.  Her follow-up chest x-ray today shows a small residual right pleural effusion which is not changed from her immediate post chest tube removal chest x-ray.  She is hemodynamically stable.  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  Plan:  She has a follow-up appointment with Dr. Lisbeth Renshaw next week and will continue to follow-up with Dr.  Benay Spice.  I will have her return in 1 week so that I can remove her chest tube suture.  The site is not healed well enough at this time to remove it.  I wrote her another prescription today for oxycodone IR 5 mg, 1 p.o. every 6 hours as needed for pain #30 with no refills.   Gaye Pollack, MD [facility] [phone]

## 2018-02-14 ENCOUNTER — Ambulatory Visit: Payer: Medicare Other | Admitting: Surgery

## 2018-02-18 ENCOUNTER — Encounter (HOSPITAL_COMMUNITY): Payer: Self-pay | Admitting: *Deleted

## 2018-02-18 ENCOUNTER — Inpatient Hospital Stay (HOSPITAL_COMMUNITY)
Admission: EM | Admit: 2018-02-18 | Discharge: 2018-02-20 | DRG: 186 | Disposition: A | Discharge status: Home / Self Care | Payer: Medicare Other | Attending: Internal Medicine | Admitting: Internal Medicine

## 2018-02-18 ENCOUNTER — Emergency Department (HOSPITAL_COMMUNITY): Payer: Medicare Other

## 2018-02-18 ENCOUNTER — Other Ambulatory Visit: Payer: Self-pay

## 2018-02-18 DIAGNOSIS — Z79899 Other long term (current) drug therapy: Secondary | ICD-10-CM

## 2018-02-18 DIAGNOSIS — Z9842 Cataract extraction status, left eye: Secondary | ICD-10-CM

## 2018-02-18 DIAGNOSIS — Z79891 Long term (current) use of opiate analgesic: Secondary | ICD-10-CM

## 2018-02-18 DIAGNOSIS — J9621 Acute and chronic respiratory failure with hypoxia: Secondary | ICD-10-CM | POA: Diagnosis present

## 2018-02-18 DIAGNOSIS — Z9841 Cataract extraction status, right eye: Secondary | ICD-10-CM

## 2018-02-18 DIAGNOSIS — Z7951 Long term (current) use of inhaled steroids: Secondary | ICD-10-CM

## 2018-02-18 DIAGNOSIS — Z9981 Dependence on supplemental oxygen: Secondary | ICD-10-CM

## 2018-02-18 DIAGNOSIS — Z791 Long term (current) use of non-steroidal anti-inflammatories (NSAID): Secondary | ICD-10-CM

## 2018-02-18 DIAGNOSIS — J449 Chronic obstructive pulmonary disease, unspecified: Secondary | ICD-10-CM | POA: Diagnosis present

## 2018-02-18 DIAGNOSIS — Z85038 Personal history of other malignant neoplasm of large intestine: Secondary | ICD-10-CM

## 2018-02-18 DIAGNOSIS — Z9889 Other specified postprocedural states: Secondary | ICD-10-CM

## 2018-02-18 DIAGNOSIS — Z9221 Personal history of antineoplastic chemotherapy: Secondary | ICD-10-CM

## 2018-02-18 DIAGNOSIS — D490 Neoplasm of unspecified behavior of digestive system: Secondary | ICD-10-CM | POA: Diagnosis present

## 2018-02-18 DIAGNOSIS — Z87891 Personal history of nicotine dependence: Secondary | ICD-10-CM

## 2018-02-18 DIAGNOSIS — D3A020 Benign carcinoid tumor of the appendix: Secondary | ICD-10-CM | POA: Diagnosis present

## 2018-02-18 DIAGNOSIS — R5383 Other fatigue: Secondary | ICD-10-CM | POA: Diagnosis not present

## 2018-02-18 DIAGNOSIS — C3411 Malignant neoplasm of upper lobe, right bronchus or lung: Secondary | ICD-10-CM | POA: Diagnosis not present

## 2018-02-18 DIAGNOSIS — J9611 Chronic respiratory failure with hypoxia: Secondary | ICD-10-CM | POA: Diagnosis present

## 2018-02-18 DIAGNOSIS — R0602 Shortness of breath: Secondary | ICD-10-CM | POA: Diagnosis not present

## 2018-02-18 DIAGNOSIS — Z90710 Acquired absence of both cervix and uterus: Secondary | ICD-10-CM

## 2018-02-18 DIAGNOSIS — J90 Pleural effusion, not elsewhere classified: Principal | ICD-10-CM | POA: Diagnosis present

## 2018-02-18 DIAGNOSIS — R079 Chest pain, unspecified: Secondary | ICD-10-CM | POA: Diagnosis not present

## 2018-02-18 DIAGNOSIS — Z961 Presence of intraocular lens: Secondary | ICD-10-CM | POA: Diagnosis present

## 2018-02-18 DIAGNOSIS — Z8249 Family history of ischemic heart disease and other diseases of the circulatory system: Secondary | ICD-10-CM

## 2018-02-18 LAB — I-STAT CG4 LACTIC ACID, ED
LACTIC ACID, VENOUS: 1.92 mmol/L — AB (ref 0.5–1.9)
Lactic Acid, Venous: 1.15 mmol/L (ref 0.5–1.9)

## 2018-02-18 LAB — COMPREHENSIVE METABOLIC PANEL
ALBUMIN: 3.3 g/dL — AB (ref 3.5–5.0)
ALK PHOS: 72 U/L (ref 38–126)
ALT: 9 U/L — AB (ref 14–54)
AST: 22 U/L (ref 15–41)
Anion gap: 11 (ref 5–15)
BUN: <5 mg/dL — ABNORMAL LOW (ref 6–20)
CHLORIDE: 98 mmol/L — AB (ref 101–111)
CO2: 26 mmol/L (ref 22–32)
CREATININE: 0.48 mg/dL (ref 0.44–1.00)
Calcium: 8.8 mg/dL — ABNORMAL LOW (ref 8.9–10.3)
GFR calc non Af Amer: >60 mL/min (ref >60)
GLUCOSE: 122 mg/dL — AB (ref 65–99)
Potassium: 4.1 mmol/L (ref 3.5–5.1)
Sodium: 135 mmol/L (ref 135–145)
Total Bilirubin: 0.8 mg/dL (ref 0.3–1.2)
Total Protein: 6.2 g/dL — ABNORMAL LOW (ref 6.5–8.1)

## 2018-02-18 LAB — CBC WITH DIFFERENTIAL/PLATELET
Basophils Absolute: 0 10*3/uL (ref 0.0–0.1)
Basophils Relative: 0 %
EOS ABS: 0.2 10*3/uL (ref 0.0–0.7)
Eosinophils Relative: 2 %
HEMATOCRIT: 33.3 % — AB (ref 36.0–46.0)
HEMOGLOBIN: 10.6 g/dL — AB (ref 12.0–15.0)
LYMPHS ABS: 0.8 10*3/uL (ref 0.7–4.0)
Lymphocytes Relative: 9 %
MCH: 31.6 pg (ref 26.0–34.0)
MCHC: 31.8 g/dL (ref 30.0–36.0)
MCV: 99.4 fL (ref 78.0–100.0)
MONO ABS: 1.2 10*3/uL — AB (ref 0.1–1.0)
MONOS PCT: 13 %
NEUTROS ABS: 6.8 10*3/uL (ref 1.7–7.7)
NEUTROS PCT: 76 %
Platelets: 224 10*3/uL (ref 150–400)
RBC: 3.35 MIL/uL — ABNORMAL LOW (ref 3.87–5.11)
RDW: 15 % (ref 11.5–15.5)
WBC: 9 10*3/uL (ref 4.0–10.5)

## 2018-02-18 LAB — I-STAT TROPONIN, ED: Troponin i, poc: 0.01 ng/mL (ref 0.00–0.08)

## 2018-02-18 LAB — INFLUENZA PANEL BY PCR (TYPE A & B)
INFLAPCR: NEGATIVE
Influenza B By PCR: NEGATIVE

## 2018-02-18 MED ORDER — LACTATED RINGERS IV SOLN
INTRAVENOUS | Status: DC
  Administered 2018-02-19: 01:00:00 via INTRAVENOUS

## 2018-02-18 MED ORDER — ACETAMINOPHEN 325 MG PO TABS: 650.0000 mg | ORAL_TABLET | Freq: Four times a day (QID) | ORAL | Status: DC | PRN

## 2018-02-18 MED ORDER — ONDANSETRON HCL 4 MG/2ML IJ SOLN
4.0000 mg | Freq: Four times a day (QID) | INTRAMUSCULAR | Status: DC | PRN
Start: 2018-02-18 — End: 2018-02-20

## 2018-02-18 MED ORDER — ALBUTEROL SULFATE (2.5 MG/3ML) 0.083% IN NEBU
2.5000 mg | INHALATION_SOLUTION | RESPIRATORY_TRACT | Status: DC | PRN
Start: 2018-02-18 — End: 2018-02-20

## 2018-02-18 MED ORDER — MORPHINE SULFATE (PF) 2 MG/ML IV SOLN: 2.0000 mg | INTRAVENOUS | Status: DC | PRN

## 2018-02-18 MED ORDER — IPRATROPIUM-ALBUTEROL 0.5-2.5 (3) MG/3ML IN SOLN
3.0000 mL | Freq: Four times a day (QID) | RESPIRATORY_TRACT | Status: DC
  Administered 2018-02-18 – 2018-02-19 (×4): 3 mL via RESPIRATORY_TRACT
  Filled 2018-02-18 (×4): qty 3

## 2018-02-18 MED ORDER — DOCUSATE SODIUM 100 MG PO CAPS
100.0000 mg | ORAL_CAPSULE | Freq: Two times a day (BID) | ORAL | Status: DC
  Administered 2018-02-18 – 2018-02-20 (×4): 100 mg via ORAL
  Filled 2018-02-18 (×5): qty 1

## 2018-02-18 MED ORDER — OXYCODONE HCL 5 MG PO TABS
5.0000 mg | ORAL_TABLET | Freq: Three times a day (TID) | ORAL | Status: DC | PRN
  Administered 2018-02-19 – 2018-02-20 (×4): 5 mg via ORAL
  Filled 2018-02-18 (×4): qty 1

## 2018-02-18 MED ORDER — IOPAMIDOL (ISOVUE-370) INJECTION 76%
INTRAVENOUS | Status: AC
  Administered 2018-02-18: 100 mL
  Filled 2018-02-18: qty 100

## 2018-02-18 MED ORDER — ONDANSETRON HCL 4 MG PO TABS: 4.0000 mg | ORAL_TABLET | Freq: Four times a day (QID) | ORAL | Status: DC | PRN

## 2018-02-18 MED ORDER — ACETAMINOPHEN 650 MG RE SUPP: 650.0000 mg | Freq: Four times a day (QID) | RECTAL | Status: DC | PRN

## 2018-02-18 MED ORDER — PROCHLORPERAZINE MALEATE 5 MG PO TABS: 5.0000 mg | ORAL_TABLET | Freq: Four times a day (QID) | ORAL | Status: DC | PRN

## 2018-02-18 NOTE — ED Notes (Signed)
Pt ambulated to bathroom on 3L, HR 127 and oxygen dropped to 87%, Mia PA alerted

## 2018-02-18 NOTE — ED Triage Notes (Addendum)
PT sent here by Dr Vantright's office for sob. Hx of lung ca with a recent effusion r/t median stenoscopy.  Breathing labored.  Pt also c/t sternal chest pain.

## 2018-02-18 NOTE — H&P (Signed)
History and Physical    Melissa Golden [id number]:[id number] DOB: 12-05-1945 DOA: 02/18/2018  PCP: Rory Percy, MD Consultants:  Cyndia Bent - CT surgery; Lisbeth Renshaw - rad onc; Sherrill - oncology Patient coming from:  Home - lives with husband; NOKOlam Idler, [phone]; [phone]  Chief Complaint: SOB  HPI: Melissa Golden is a 72 y.o. 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  She is currently scheduled to begin concurrent radiation and Taxol/carboplatin chemotherapy.  She initially started chemotherapy for the goblet cell tumor and then was found to have a dominant lung lesion for which mediastinoscopy and LN biopsy (02/04/18) led to a hemothorax.  Chest tube was placed and the patient was transfused.  While at home, she started getting worsening SOB with lots of pain in her right upper chest.  This has been ongoing for the last 4 days.  She has no energy, extremely hard to get out of bed.  She really is unable to walk around at all.  She is unable to bend over because she can't get back up.  She can't sleep on her right side because it hurts too much.  SOB is even present at rest.  Her husband laid next to her last night and her breathing "was chunky."  Dr. Prescott Gum evaluated her this AM in the ER and told her she had fluid in her right lung.    ED Course: Sent in from rad onc for SOB.  New loculated pleural effusion in the right lower lobe.  Borderline elevation in lactate.  Dr. Cyndia Bent thinks this is a residual effusion from the procedure but if she having ongoing SOB then she should be observed and possibly having a thoracentesis by IR.  Ambulation - tachypnea, tachycardia, hypoxia on home O2.  Review of Systems: As per HPI; otherwise review  of systems reviewed and negative.   Ambulatory Status:  Ambulates without assistance  Past Medical History:  Diagnosis Date  . Asthma   . Cancer ([place]) 2017   colon   chemo tx  . COPD (chronic obstructive pulmonary disease) ([place])    wears 3L home O2  . Ileus following gastrointestinal surgery 09/12/2017   required TPN support  . Shortness of breath    with exertion    Past Surgical History:  Procedure Laterality Date  . ABDOMINAL HYSTERECTOMY    . CATARACT EXTRACTION W/ INTRAOCULAR LENS IMPLANT     Right  . CATARACT EXTRACTION W/PHACO  01/02/2013   Procedure: CATARACT EXTRACTION PHACO AND INTRAOCULAR LENS PLACEMENT (IOC);  Surgeon: Tonny Branch, MD;  Location: [facility];  Service: Ophthalmology;  Laterality: Left;  CDE: 16.19  . EYE SURGERY    . LAPAROSCOPIC APPENDECTOMY  09/12/2017  . laparoscopic hemicolectomy Right 12/07/2017   DR. Stitzenberg and [place]    . LYMPH NODE BIOPSY N/A 02/04/2018   Procedure: LYMPH NODE BIOPSY;  Surgeon: Gaye Pollack, MD;  Location: [facility];  Service: Thoracic;  Laterality: N/A;  . MEDIASTINOSCOPY N/A 02/04/2018   Procedure: MEDIASTINOSCOPY;  Surgeon: Gaye Pollack, MD;  Location: [facility];  Service: Thoracic;  Laterality: N/A;    Social History   Socioeconomic History  . Marital status: Married    Spouse name: Not on file  . Number of  children: Not on file  . Years of education: Not on file  . Highest education level: Not on file  Occupational History  . Occupation: retired  Scientific laboratory technician  . Financial resource strain: Not on file  . Food insecurity:    Worry: Not on file    Inability: Not on file  . Transportation needs:    Medical: Not on file    Non-medical: Not on file  Tobacco Use  . Smoking status: Former Smoker    Packs/day: 1.50    Years: 68.00    Pack years: 102.00    Types: Cigarettes    Last attempt to quit: 10/2016    Years since quitting: 1.3  . Smokeless tobacco: Never Used  Substance and Sexual  Activity  . Alcohol use: Yes    Alcohol/week: 3.6 oz    Types: 6 Cans of beer per week    Comment: rare  . Drug use: No  . Sexual activity: Not on file  Lifestyle  . Physical activity:    Days per week: Not on file    Minutes per session: Not on file  . Stress: Not on file  Relationships  . Social connections:    Talks on phone: Not on file    Gets together: Not on file    Attends religious service: Not on file    Active member of club or organization: Not on file    Attends meetings of clubs or organizations: Not on file    Relationship status: Not on file  . Intimate partner violence:    Fear of current or ex partner: Not on file    Emotionally abused: Not on file    Physically abused: Not on file    Forced sexual activity: Not on file  Other Topics Concern  . Not on file  Social History Narrative  . Not on file    No Known Allergies  Family History  Problem Relation Age of Onset  . Cancer Mother   . Heart attack Father   . Cancer - Colon Sister     Prior to Admission medications   Medication Sig Start Date End Date Taking? Authorizing Provider  albuterol (PROVENTIL HFA;VENTOLIN HFA) 108 (90 BASE) MCG/ACT inhaler Inhale 2 puffs into the lungs every 6 (six) hours as needed. Shortness of Breath    [provider]  budesonide (PULMICORT) 180 MCG/ACT inhaler Inhale 2 puffs into the lungs 2 (two) times daily.    [provider]  capecitabine (XELODA) 500 MG tablet TAKE 3 TABLETS (1,500 MG TOTAL) BY MOUTH 2 (TWO) TIMES DAILY AFTER A MEAL. TAKE FOR 14 DAYS ON, 7 DAYS OFF, REPEAT EVERY 21 DAYS 01/22/18   Ladell Pier, MD  dexamethasone (DECADRON) 4 MG tablet Take 2.5 tablets (10mg ) at bedtime the night before chemo and 10mg  at 6AM day of first chemo. 02/12/18   Ladell Pier, MD  ibuprofen (ADVIL,MOTRIN) 200 MG tablet Take 400 mg by mouth every 6 (six) hours as needed. Pain    [provider]  Multiple Vitamin (MULTIVITAMIN WITH MINERALS) TABS  Take 1 tablet by mouth daily.    [provider]  oxyCODONE (ROXICODONE) 5 MG immediate release tablet Take 1 tablet (5 mg total) by mouth every 8 (eight) hours as needed. 02/08/18 02/08/19  Elgie Collard, PA-C  prochlorperazine (COMPAZINE) 5 MG tablet Take 1 tablet (5 mg total) by mouth every 6 (six) hours as needed for nausea or vomiting. 02/12/18   Betsy Coder  B, MD    Physical Exam: Vitals:   02/18/18 1515 02/18/18 1545 02/18/18 1630 02/18/18 1730  BP: 112/61 115/60 126/60 (!) 119/58  Pulse:  78 86 75  Resp: 16 17 20 19   Temp:      TempSrc:      SpO2:  100% 99% 100%  Weight:      Height:         General:  Appears mildly SOB even at rest  Eyes:  PERRL, EOMI, normal lids, iris ENT:  grossly normal hearing, lips & tongue, mmm Neck:  no LAD, masses or thyromegaly; recently mediastinal incision noted Cardiovascular:  RRR, no m/r/g. No LE edema.  Respiratory:  Diminished breath sounds in RLL, increased respiratory effort. Abdomen:  soft, NT, ND, NABS Back:   normal alignment, no CVAT Skin:  no rash or induration seen on limited exam Musculoskeletal:  grossly normal tone BUE/BLE, good ROM, no bony abnormality Psychiatric:  grossly normal mood and affect, speech fluent and appropriate, AOx3 Neurologic:  CN 2-12 grossly intact, moves all extremities in coordinated fashion, sensation intact    Radiological Exams on Admission: Dg Chest 2 View  Result Date: 02/18/2018 CLINICAL DATA:  Chest pain for several days EXAM: CHEST - 2 VIEW COMPARISON:  02/13/2018 FINDINGS: Cardiac shadow is stable. Persistent right basilar atelectasis and effusion is noted similar to that seen on the prior exam. The paratracheal changes on the right are less well visualized and may be related to some interval patient rotation. The nodular change on the prior exam is not as well visualized. No bony abnormality is noted. IMPRESSION: Persistent changes in the right base. Previously seen nodules and  paratracheal prominence are less prominent on the current exam likely related to patient rotation. Electronically Signed   By: Inez Catalina M.D.   On: 02/18/2018 11:13   Ct Angio Chest Pe W And/or Wo Contrast  Result Date: 02/18/2018 CLINICAL DATA:  Shortness of breath, recent pleural effusion, lung cancer, labored breathing, sternal chest pain, former smoker, COPD, asthma EXAM: CT ANGIOGRAPHY CHEST WITH CONTRAST TECHNIQUE: Multidetector CT imaging of the chest was performed using the standard protocol during bolus administration of intravenous contrast. Multiplanar CT image reconstructions and MIPs were obtained to evaluate the vascular anatomy. CONTRAST:  172mL ISOVUE-370 IOPAMIDOL (ISOVUE-370) INJECTION 76% IV COMPARISON:  01/21/2018 FINDINGS: Cardiovascular: Atherosclerotic calcifications aorta, proximal great vessels and coronary arteries. Aorta normal caliber without aneurysm or dissection. Preferential opacification of systemic arterial rather than pulmonary arterial vessels. No large or central pulmonary emboli are visualized. This exam is unable to exclude small/peripheral emboli due to suboptimal enhancement. No pericardial effusion. Mediastinum/Nodes: Esophagus unremarkable. Base of cervical region normal appearance. Mediastinal adenopathy present: 13 mm superior mediastinal node image 15 previously 6 mm; RIGHT paratracheal node 13 mm image 28 previously 11 mm; anterior mediastinal node 21 mm image 45 previously 20 mm; RIGHT paratracheal node 17 mm image 33 previously 16 mm. No axillary adenopathy. Lungs/pleura: Moderate-sized RIGHT pleural effusion partially loculated. Compressive atelectasis RIGHT lower lobe. Minimal scattered atelectasis in RIGHT middle lobe and lingula. Nodular density RIGHT middle lobe 8 mm image 40 unchanged. No acute infiltrate. RIGHT upper lobe nodule 6 mm image 19 stable. Tiny peripheral nodular density RIGHT upper lobe on previous exam appears represent more linear scar on  current study. Upper Abdomen: Visualized upper abdomen unremarkable. Musculoskeletal: No acute osseous findings. Review of the MIP images confirms the above findings. IMPRESSION: No gross evidence of large/central pulmonary emboli identified, though due to suboptimal opacification  unable to exclude small peripheral emboli. Moderate-sized partially loculated RIGHT pleural effusion with partial atelectasis of RIGHT lower lobe. Mediastinal adenopathy, minimally increased. Pulmonary nodules little changed. Scattered atherosclerotic calcifications including coronary arteries. Aortic Atherosclerosis (ICD10-I70.0). Electronically Signed   By: Lavonia Dana M.D.   On: 02/18/2018 14:45    EKG: Independently reviewed.  Sinus tachycardia with rate 105; incomplete RBBB, RVH; nonspecific ST changes with no evidence of acute ischemia   Labs on Admission: I have personally reviewed the available labs and imaging studies at the time of the admission.  Pertinent labs:   Troponin 0.01 Lactate 1.92, 1.15 Glucose 122 WBC 9.0 Hgb 10.6 - stable   Assessment/Plan Principal Problem:   Acute on chronic respiratory failure with hypoxia (HCC) Active Problems:   Malignant neoplasm of right upper lobe of lung (HCC)   Pleural effusion   Carcinoid tumor of appendix   COPD (chronic obstructive pulmonary disease) (HCC)   Acute on chronic respiratory failure with hypoxia -patient with underlying COPD on home O2 presenting with worsening SOB, hypoxia -She has a recent hospitalization after developing hemothorax following mediastinoscopy and LN biopsy -Has recent diagnosis of lung cancer -Current hypoxia appears to be due to worsening pleural effusion (see below)  Pleural effusion -Now presenting with worsening right-sided effusion occupying 1/3 of her chest -She is having SOB which is likely related to the effusion -Will consult IR -I spoke with Dr. Annamaria Boots, who will plan to perform thoracentesis tomorrow AM -Will  make patient NPO after midnight -Pain control with oxycodone and morphine prn  Lung CA -Recent diagnosis of NSCLC, locally advanced -She is planning to start concurrent radiation and Taxol/carboplatin chemotherapy next week  COPD -She is on 3L home O2 at baseline, but currently is requiring 3L with resultant tachycardia, tachypnea, hypoxia with ambulation - thought to be resulting from pleural effusion rather than from COPD complication -prn albuterol nebs, standing Duonebs -Hold Pulmicort for now  Carcinoid tumor -Adjuvant treatment with Xeloda is on hold for now while treating the lung cancer    DVT prophylaxis: SCDs (pre-biopsy) Code Status:  Full - confirmed with patient/family Family Communication: Husband present throughout evaluation  Disposition Plan:  Home once clinically improved Consults called: CT surgery; IR  Admission status: It is my clinical opinion that referral for OBSERVATION is reasonable and necessary in this patient based on the above information provided. The aforementioned taken together are felt to place the patient at high risk for further clinical deterioration. However it is anticipated that the patient may be medically stable for discharge from the hospital within 24 to 48 hours.     Karmen Bongo MD [facility]  If note is complete, please contact covering daytime or nighttime physician. [email or web address] Password [facility]  02/18/2018, 6:09 PM

## 2018-02-18 NOTE — ED Provider Notes (Signed)
  Medical screening examination/treatment/procedure(s) were conducted as a shared visit with non-physician practitioner(s) and myself.  I personally evaluated the patient during the encounter.  Clinical Impression:   Final diagnoses:  None    The patient is a 72 year old female, diagnosed with lung cancer presenting with increased shortness of breath and dyspnea, on exam the patient has decreased lung sounds on the right, she is mildly tachypneic and her imaging shows that she has a progressively enlarging pleural effusion on the right.  The patient has recently had fluid removed, may need to have this tapped,   Patient and family aware, will call for consultation.   EKG Interpretation  Date/Time:  Monday February 18 2018 09:54:23 EDT Ventricular Rate:  105 PR Interval:  124 QRS Duration: 100 QT Interval:  324 QTC Calculation: 428 R Axis:   121 Text Interpretation:  Sinus tachycardia Left atrial enlargement Incomplete right bundle branch block Right ventricular hypertrophy Abnormal ECG Since last tracing rate faster and QRS morphology has changed Confirmed by Noemi Chapel [phone]) on 02/18/2018 1:35:13 PM      Medical screening examination/treatment/procedure(s) were conducted as a shared visit with non-physician practitioner(s) and myself.  I personally evaluated the patient during the encounter  Please see my separate respective documentation pertaining to this patient encounter    Noemi Chapel, MD 02/18/18 2118

## 2018-02-18 NOTE — ED Provider Notes (Signed)
[facility] EMERGENCY DEPARTMENT Provider Note   CSN: [id number] Arrival date & time: 02/18/18  0935     History   Chief Complaint Chief Complaint  Patient presents with  . Shortness of Breath    HPI Melissa Golden is a 72 y.o. 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 Lucianne Lei Trigt's office for dyspnea.  The patient endorses constant, worsening, severe dyspnea over the last 4 days with associated right-sided chest pain despite home O2.  She is unable to sleep on her right side due to the pain.  She has been more sedentary and fatigued over the last few days since her symptoms have worsened.  She denies fever, chills, headache, rash, abdominal pain, N/V/D.   Seen by Dr. Cyndia Bent on 3/20 for follow-up post mediastinoscopy with lymph node biopsy on 02/04/18 with hemothorax requiring chest tube placement. She follows up with Dr. Cyndia Bent on 3/27.   The history is provided by the patient. No language interpreter was used.  Shortness of Breath  This is a new problem. The average episode lasts 4 days. The problem occurs continuously.The current episode started more than 2 days ago. The problem has been gradually worsening. Associated symptoms include chest pain. Pertinent negatives include no fever, no headaches, no neck pain, no wheezing, no syncope, no vomiting, no abdominal pain, no rash and no leg swelling. The problem's precipitants include medical treatment. Risk factors include recent prolonged sitting. She has tried nothing for the symptoms. The treatment provided no relief. She has had prior hospitalizations. She has had prior ED visits. Associated medical issues include asthma, COPD, chronic lung disease and recent surgery.    Past Medical History:  Diagnosis Date  . Asthma   . Cancer ([place]) 2017   colon   chemo tx  . COPD (chronic obstructive pulmonary  disease) ([place])    wears 3L home O2  . Ileus following gastrointestinal surgery 09/12/2017   required TPN support  . Shortness of breath    with exertion    Patient Active Problem List   Diagnosis Date Noted  . Acute on chronic respiratory failure with hypoxia ([place]) 02/18/2018  . Hemothorax on right 02/04/2018  . Malignant neoplasm of right upper lobe of lung ([place]) 01/10/2018    Past Surgical History:  Procedure Laterality Date  . ABDOMINAL HYSTERECTOMY    . CATARACT EXTRACTION W/ INTRAOCULAR LENS IMPLANT     Right  . CATARACT EXTRACTION W/PHACO  01/02/2013   Procedure: CATARACT EXTRACTION PHACO AND INTRAOCULAR LENS PLACEMENT (IOC);  Surgeon: Tonny Branch, MD;  Location: [facility];  Service: Ophthalmology;  Laterality: Left;  CDE: 16.19  . EYE SURGERY    . LAPAROSCOPIC APPENDECTOMY  09/12/2017  . laparoscopic hemicolectomy Right 12/07/2017   DR. Stitzenberg and [place]    . LYMPH NODE BIOPSY N/A 02/04/2018   Procedure: LYMPH NODE BIOPSY;  Surgeon: Gaye Pollack, MD;  Location: [facility];  Service: Thoracic;  Laterality: N/A;  . MEDIASTINOSCOPY N/A 02/04/2018   Procedure: MEDIASTINOSCOPY;  Surgeon: Gaye Pollack, MD;  Location: [facility];  Service: Thoracic;  Laterality: N/A;     OB History   None      Home Medications    Prior to Admission medications   Medication Sig Start Date End Date Taking? Authorizing Provider  albuterol (PROVENTIL HFA;VENTOLIN HFA) 108 (90 BASE)  MCG/ACT inhaler Inhale 2 puffs into the lungs every 6 (six) hours as needed. Shortness of Breath    [provider]  budesonide (PULMICORT) 180 MCG/ACT inhaler Inhale 2 puffs into the lungs 2 (two) times daily.    [provider]  capecitabine (XELODA) 500 MG tablet TAKE 3 TABLETS (1,500 MG TOTAL) BY MOUTH 2 (TWO) TIMES DAILY AFTER A MEAL. TAKE FOR 14 DAYS ON, 7 DAYS OFF, REPEAT EVERY 21 DAYS 01/22/18   Ladell Pier, MD  dexamethasone (DECADRON) 4 MG tablet Take 2.5 tablets  (10mg ) at bedtime the night before chemo and 10mg  at 6AM day of first chemo. 02/12/18   Ladell Pier, MD  ibuprofen (ADVIL,MOTRIN) 200 MG tablet Take 400 mg by mouth every 6 (six) hours as needed. Pain    [provider]  Multiple Vitamin (MULTIVITAMIN WITH MINERALS) TABS Take 1 tablet by mouth daily.    [provider]  oxyCODONE (ROXICODONE) 5 MG immediate release tablet Take 1 tablet (5 mg total) by mouth every 8 (eight) hours as needed. 02/08/18 02/08/19  Elgie Collard, PA-C  prochlorperazine (COMPAZINE) 5 MG tablet Take 1 tablet (5 mg total) by mouth every 6 (six) hours as needed for nausea or vomiting. 02/12/18   Ladell Pier, MD    Family History Family History  Problem Relation Age of Onset  . Cancer Mother   . Heart attack Father   . Cancer - Colon Sister     Social History Social History   Tobacco Use  . Smoking status: Former Smoker    Packs/day: 1.50    Years: 68.00    Pack years: 102.00    Types: Cigarettes    Last attempt to quit: 10/2016    Years since quitting: 1.3  . Smokeless tobacco: Never Used  Substance Use Topics  . Alcohol use: Yes    Alcohol/week: 3.6 oz    Types: 6 Cans of beer per week    Comment: rare  . Drug use: No     Allergies   Patient has no known allergies.   Review of Systems Review of Systems  Constitutional: Positive for fatigue. Negative for chills and fever.  HENT: Negative for congestion, sinus pressure and sinus pain.   Eyes: Negative for visual disturbance.  Respiratory: Positive for shortness of breath. Negative for chest tightness and wheezing.   Cardiovascular: Positive for chest pain. Negative for palpitations, leg swelling and syncope.  Gastrointestinal: Negative for abdominal pain, diarrhea, nausea and vomiting.  Genitourinary: Negative for dysuria and urgency.  Musculoskeletal: Negative for neck pain and neck stiffness.  Skin: Negative for rash.  Allergic/Immunologic: Positive for  immunocompromised state.  Neurological: Negative for dizziness, weakness, numbness and headaches.     Physical Exam Updated Vital Signs BP (!) 119/58   Pulse 75   Temp 99.6 F (37.6 C) (Oral)   Resp 19   Ht 5\' 2"  (1.575 m)   Wt 58.1 kg (128 lb)   SpO2 100%   BMI 23.41 kg/m   Physical Exam  Constitutional: No distress. Nasal cannula in place.  HENT:  Head: Normocephalic.  Eyes: Conjunctivae are normal.  Neck: Neck supple.  Cardiovascular: Regular rhythm, normal heart sounds and intact distal pulses. Tachycardia present. Exam reveals no gallop and no friction rub.  No murmur heard. Pulmonary/Chest: Effort normal. No respiratory distress.  Coarse breath sounds in the RLM and RLL. Left lung is CTA.   Abdominal: Soft. Bowel sounds are normal. She exhibits no distension and  no mass. There is no tenderness. There is no rebound and no guarding. No hernia.  Neurological: She is alert.  Skin: Skin is warm. Capillary refill takes less than 2 seconds. No rash noted.  Psychiatric: Her behavior is normal.  Nursing note and vitals reviewed.  ED Treatments / Results  Labs (all labs ordered are listed, but only abnormal results are displayed) Labs Reviewed  COMPREHENSIVE METABOLIC PANEL - Abnormal; Notable for the following components:      Result Value   Chloride 98 (*)    Glucose, Bld 122 (*)    BUN <5 (*)    Calcium 8.8 (*)    Total Protein 6.2 (*)    Albumin 3.3 (*)    ALT 9 (*)    All other components within normal limits  CBC WITH DIFFERENTIAL/PLATELET - Abnormal; Notable for the following components:   RBC 3.35 (*)    Hemoglobin 10.6 (*)    HCT 33.3 (*)    Monocytes Absolute 1.2 (*)    All other components within normal limits  I-STAT CG4 LACTIC ACID, ED - Abnormal; Notable for the following components:   Lactic Acid, Venous 1.92 (*)    All other components within normal limits  INFLUENZA PANEL BY PCR (TYPE A & B)  I-STAT CG4 LACTIC ACID, ED  I-STAT TROPONIN, ED     EKG EKG Interpretation  Date/Time:  Monday February 18 2018 09:54:23 EDT Ventricular Rate:  105 PR Interval:  124 QRS Duration: 100 QT Interval:  324 QTC Calculation: 428 R Axis:   121 Text Interpretation:  Sinus tachycardia Left atrial enlargement Incomplete right bundle branch block Right ventricular hypertrophy Abnormal ECG Since last tracing rate faster and QRS morphology has changed Confirmed by Noemi Chapel [phone]) on 02/18/2018 1:35:13 PM   Radiology Dg Chest 2 View  Result Date: 02/18/2018 CLINICAL DATA:  Chest pain for several days EXAM: CHEST - 2 VIEW COMPARISON:  02/13/2018 FINDINGS: Cardiac shadow is stable. Persistent right basilar atelectasis and effusion is noted similar to that seen on the prior exam. The paratracheal changes on the right are less well visualized and may be related to some interval patient rotation. The nodular change on the prior exam is not as well visualized. No bony abnormality is noted. IMPRESSION: Persistent changes in the right base. Previously seen nodules and paratracheal prominence are less prominent on the current exam likely related to patient rotation. Electronically Signed   By: Inez Catalina M.D.   On: 02/18/2018 11:13   Ct Angio Chest Pe W And/or Wo Contrast  Result Date: 02/18/2018 CLINICAL DATA:  Shortness of breath, recent pleural effusion, lung cancer, labored breathing, sternal chest pain, former smoker, COPD, asthma EXAM: CT ANGIOGRAPHY CHEST WITH CONTRAST TECHNIQUE: Multidetector CT imaging of the chest was performed using the standard protocol during bolus administration of intravenous contrast. Multiplanar CT image reconstructions and MIPs were obtained to evaluate the vascular anatomy. CONTRAST:  142mL ISOVUE-370 IOPAMIDOL (ISOVUE-370) INJECTION 76% IV COMPARISON:  01/21/2018 FINDINGS: Cardiovascular: Atherosclerotic calcifications aorta, proximal great vessels and coronary arteries. Aorta normal caliber without aneurysm or dissection.  Preferential opacification of systemic arterial rather than pulmonary arterial vessels. No large or central pulmonary emboli are visualized. This exam is unable to exclude small/peripheral emboli due to suboptimal enhancement. No pericardial effusion. Mediastinum/Nodes: Esophagus unremarkable. Base of cervical region normal appearance. Mediastinal adenopathy present: 13 mm superior mediastinal node image 15 previously 6 mm; RIGHT paratracheal node 13 mm image 28 previously 11 mm; anterior mediastinal node 21  mm image 45 previously 20 mm; RIGHT paratracheal node 17 mm image 33 previously 16 mm. No axillary adenopathy. Lungs/pleura: Moderate-sized RIGHT pleural effusion partially loculated. Compressive atelectasis RIGHT lower lobe. Minimal scattered atelectasis in RIGHT middle lobe and lingula. Nodular density RIGHT middle lobe 8 mm image 40 unchanged. No acute infiltrate. RIGHT upper lobe nodule 6 mm image 19 stable. Tiny peripheral nodular density RIGHT upper lobe on previous exam appears represent more linear scar on current study. Upper Abdomen: Visualized upper abdomen unremarkable. Musculoskeletal: No acute osseous findings. Review of the MIP images confirms the above findings. IMPRESSION: No gross evidence of large/central pulmonary emboli identified, though due to suboptimal opacification unable to exclude small peripheral emboli. Moderate-sized partially loculated RIGHT pleural effusion with partial atelectasis of RIGHT lower lobe. Mediastinal adenopathy, minimally increased. Pulmonary nodules little changed. Scattered atherosclerotic calcifications including coronary arteries. Aortic Atherosclerosis (ICD10-I70.0). Electronically Signed   By: Lavonia Dana M.D.   On: 02/18/2018 14:45    Procedures Procedures (including critical care time)  Medications Ordered in ED Medications  iopamidol (ISOVUE-370) 76 % injection (100 mLs  Contrast Given 02/18/18 1419)     Initial Impression / Assessment and Plan /  ED Course  I have reviewed the triage vital signs and the nursing notes.  Pertinent labs & imaging results that were available during my care of the patient were reviewed by me and considered in my medical decision making (see chart for details).     72 year old female with a h/o of malignant neoplasm of RUL, COPD, asthma, presenting with worsening dyspnea and chest pain over the last 4 days.  On initial exam, she is tachypneic and tachycardic while sitting upright on 3 L [place].   Initial troponin is negative.  No leukocytosis.  Hemoglobin 10.6. EKG with sinus tachycardia.  CXR with persistent right basilar atelectasis and effusion in the right lung base.  Given tachycardia, worsening tachypnea, and underlying malignancy, CTA of the chest ordered, which demonstrates moderate sized partially loculated right pleural effusion with partial atelectasis of the right lower lobe.  No mediastinal adenopathy is minimally increased.  No gross PE identified. Doubt ACS, or cardiac tamponade.   The patient was discussed and evaluated with Dr. Sabra Heck, attending physician.  Spoke with Dr. Cyndia Bent, CT surgery, who suspects that the effusion is secondary to recent hemothorax and is uncertain this is an acute problem. Reports patient becomes dyspneic easily and is a poor surgical candidate due to underlying COPD. May recommend a thoracentesis or pigtail placement if she warrants it clinically.   She was ambulated on 3L O2 [place]. SaO2 decreased to 87% and HR increased to 120s. Spoke with Dr. Lorin Mercy, hospitalist, who will accept the admission. Effusion could be a developing empyema vs hemothorax. Doubt pneumonia. The patient appears reasonably stabilized for admission considering the current resources, flow, and capabilities available in the ED at this time, and I doubt any other [facility] requiring further screening and/or treatment in the ED prior to admission.  Final Clinical Impressions(s) / ED Diagnoses   Final diagnoses:    Acute on chronic respiratory failure with hypoxia [facility])    ED Discharge Orders    None       Joanne Gavel, PA-C 02/18/18 1733    Noemi Chapel, MD 02/18/18 2119

## 2018-02-18 NOTE — ED Notes (Signed)
Dr.Van Trigt recommending CTA for rule out PE and cardiac enzymes.

## 2018-02-18 NOTE — ED Notes (Signed)
Pt in gown and on monitor 

## 2018-02-19 ENCOUNTER — Observation Stay (HOSPITAL_COMMUNITY): Payer: Medicare Other

## 2018-02-19 ENCOUNTER — Ambulatory Visit: Payer: Medicare Other | Admitting: Radiation Oncology

## 2018-02-19 ENCOUNTER — Encounter (HOSPITAL_COMMUNITY): Payer: Self-pay | Admitting: Student

## 2018-02-19 ENCOUNTER — Ambulatory Visit
Admission: RE | Admit: 2018-02-19 | Discharge: 2018-02-19 | Disposition: A | Discharge status: Home / Self Care | Payer: Medicare Other | Source: Ambulatory Visit | Attending: Radiation Oncology | Admitting: Radiation Oncology

## 2018-02-19 ENCOUNTER — Other Ambulatory Visit: Payer: Self-pay

## 2018-02-19 ENCOUNTER — Encounter: Payer: Self-pay | Admitting: *Deleted

## 2018-02-19 DIAGNOSIS — Z87891 Personal history of nicotine dependence: Secondary | ICD-10-CM | POA: Diagnosis not present

## 2018-02-19 DIAGNOSIS — Z9981 Dependence on supplemental oxygen: Secondary | ICD-10-CM | POA: Diagnosis not present

## 2018-02-19 DIAGNOSIS — Z9221 Personal history of antineoplastic chemotherapy: Secondary | ICD-10-CM | POA: Diagnosis not present

## 2018-02-19 DIAGNOSIS — Z79891 Long term (current) use of opiate analgesic: Secondary | ICD-10-CM | POA: Diagnosis not present

## 2018-02-19 DIAGNOSIS — J9811 Atelectasis: Secondary | ICD-10-CM | POA: Diagnosis not present

## 2018-02-19 DIAGNOSIS — Z9842 Cataract extraction status, left eye: Secondary | ICD-10-CM | POA: Diagnosis not present

## 2018-02-19 DIAGNOSIS — Z9841 Cataract extraction status, right eye: Secondary | ICD-10-CM | POA: Diagnosis not present

## 2018-02-19 DIAGNOSIS — Z961 Presence of intraocular lens: Secondary | ICD-10-CM | POA: Diagnosis present

## 2018-02-19 DIAGNOSIS — Z85038 Personal history of other malignant neoplasm of large intestine: Secondary | ICD-10-CM | POA: Diagnosis not present

## 2018-02-19 DIAGNOSIS — Z90710 Acquired absence of both cervix and uterus: Secondary | ICD-10-CM | POA: Diagnosis not present

## 2018-02-19 DIAGNOSIS — J449 Chronic obstructive pulmonary disease, unspecified: Secondary | ICD-10-CM | POA: Diagnosis present

## 2018-02-19 DIAGNOSIS — J90 Pleural effusion, not elsewhere classified: Secondary | ICD-10-CM | POA: Diagnosis not present

## 2018-02-19 DIAGNOSIS — D3A020 Benign carcinoid tumor of the appendix: Secondary | ICD-10-CM | POA: Diagnosis not present

## 2018-02-19 DIAGNOSIS — D490 Neoplasm of unspecified behavior of digestive system: Secondary | ICD-10-CM | POA: Diagnosis present

## 2018-02-19 DIAGNOSIS — Z8249 Family history of ischemic heart disease and other diseases of the circulatory system: Secondary | ICD-10-CM | POA: Diagnosis not present

## 2018-02-19 DIAGNOSIS — C3411 Malignant neoplasm of upper lobe, right bronchus or lung: Secondary | ICD-10-CM

## 2018-02-19 DIAGNOSIS — R091 Pleurisy: Secondary | ICD-10-CM | POA: Diagnosis not present

## 2018-02-19 DIAGNOSIS — Z7951 Long term (current) use of inhaled steroids: Secondary | ICD-10-CM | POA: Diagnosis not present

## 2018-02-19 DIAGNOSIS — J9621 Acute and chronic respiratory failure with hypoxia: Secondary | ICD-10-CM | POA: Diagnosis not present

## 2018-02-19 DIAGNOSIS — Z791 Long term (current) use of non-steroidal anti-inflammatories (NSAID): Secondary | ICD-10-CM | POA: Diagnosis not present

## 2018-02-19 DIAGNOSIS — Z79899 Other long term (current) drug therapy: Secondary | ICD-10-CM | POA: Diagnosis not present

## 2018-02-19 HISTORY — PX: IR THORACENTESIS ASP PLEURAL SPACE W/IMG GUIDE: IMG5380

## 2018-02-19 LAB — BODY FLUID CELL COUNT WITH DIFFERENTIAL
EOS FL: 2 %
LYMPHS FL: 17 %
MONOCYTE-MACROPHAGE-SEROUS FLUID: 5 % — AB (ref 50–90)
Neutrophil Count, Fluid: 76 % — ABNORMAL HIGH (ref 0–25)
Total Nucleated Cell Count, Fluid: 4200 cu mm — ABNORMAL HIGH (ref 0–1000)

## 2018-02-19 LAB — PROTEIN, PLEURAL OR PERITONEAL FLUID: Total protein, fluid: 3.3 g/dL

## 2018-02-19 LAB — CBC
HCT: 31.2 % — ABNORMAL LOW (ref 36.0–46.0)
Hemoglobin: 10 g/dL — ABNORMAL LOW (ref 12.0–15.0)
MCH: 31.9 pg (ref 26.0–34.0)
MCHC: 32.1 g/dL (ref 30.0–36.0)
MCV: 99.7 fL (ref 78.0–100.0)
PLATELETS: 202 10*3/uL (ref 150–400)
RBC: 3.13 MIL/uL — ABNORMAL LOW (ref 3.87–5.11)
RDW: 14.9 % (ref 11.5–15.5)
WBC: 7.6 10*3/uL (ref 4.0–10.5)

## 2018-02-19 LAB — BASIC METABOLIC PANEL
ANION GAP: 8 (ref 5–15)
BUN: <5 mg/dL — ABNORMAL LOW (ref 6–20)
CALCIUM: 8.5 mg/dL — AB (ref 8.9–10.3)
CO2: 29 mmol/L (ref 22–32)
CREATININE: 0.41 mg/dL — AB (ref 0.44–1.00)
Chloride: 100 mmol/L — ABNORMAL LOW (ref 101–111)
GLUCOSE: 105 mg/dL — AB (ref 65–99)
Potassium: 3.8 mmol/L (ref 3.5–5.1)
Sodium: 137 mmol/L (ref 135–145)

## 2018-02-19 LAB — GRAM STAIN

## 2018-02-19 LAB — GLUCOSE, PLEURAL OR PERITONEAL FLUID: Glucose, Fluid: 88 mg/dL

## 2018-02-19 MED ORDER — LIDOCAINE HCL (PF) 1 % IJ SOLN
INTRAMUSCULAR | Status: DC | PRN
  Administered 2018-02-19: 10 mL

## 2018-02-19 MED ORDER — LIDOCAINE HCL (PF) 2 % IJ SOLN
INTRAMUSCULAR | Status: AC
  Filled 2018-02-19: qty 10

## 2018-02-19 MED ORDER — LIDOCAINE HCL (PF) 2 % IJ SOLN
INTRAMUSCULAR | Status: AC
  Filled 2018-02-19: qty 20

## 2018-02-19 MED ORDER — IPRATROPIUM-ALBUTEROL 0.5-2.5 (3) MG/3ML IN SOLN
3.0000 mL | Freq: Four times a day (QID) | RESPIRATORY_TRACT | Status: DC
  Administered 2018-02-20: 3 mL via RESPIRATORY_TRACT
  Filled 2018-02-19: qty 3

## 2018-02-19 NOTE — Progress Notes (Signed)
PROGRESS NOTE    Melissa Golden  ZOX:[id number] DOB: 1946-01-05 DOA: 02/18/2018 PCP: Rory Percy, MD   Outpatient Specialists:     Brief Narrative:  Melissa Golden is a 72 y.o. 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  She is currently scheduled to begin concurrent radiation and Taxol/carboplatin chemotherapy.  She initially started chemotherapy for the goblet cell tumor and then was found to have a dominant lung lesion for which mediastinoscopy and LN biopsy (02/04/18) led to a hemothorax.  Chest tube was placed and the patient was transfused.  While at home, she started getting worsening SOB with lots of pain in her right upper chest.  This has been ongoing for the last 4 days.  She has no energy, extremely hard to get out of bed.  She really is unable to walk around at all.  She is unable to bend over because she can't get back up.  She can't sleep on her right side because it hurts too much.  SOB is even present at rest.  Her husband laid next to her last night and her breathing "was chunky."  Dr. Prescott Gum evaluated her this AM in the ER and told her she had fluid in her right lung.        Assessment & Plan:   Principal Problem:   Acute on chronic respiratory failure with hypoxia (HCC) Active Problems:   Malignant neoplasm of right upper lobe of lung (HCC)   Pleural effusion   Carcinoid tumor of appendix   COPD (chronic obstructive pulmonary disease) (HCC)   Acute on chronic respiratory failure with hypoxia -patient with underlying COPD on home O2: 3L presenting with worsening SOB, hypoxia -She has a recent hospitalization after developing hemothorax following mediastinoscopy and LN biopsy -Has recent diagnosis of  lung cancer -Current hypoxia appears to be due to worsening pleural effusion (see below)  Pleural effusion-- loculated per repeat x ray -Now presenting with worsening right-sided effusion occupying 1/3 of her chest -s/p thoracentesis with 200 ml removed -Pain control with oxycodone and morphine prn -consult CVTS  Lung CA -Recent diagnosis of NSCLC, locally advanced -She is planning to start concurrent radiation and Taxol/carboplatin chemotherapy next week  COPD -She is on 3L home O2 at baseline -prn albuterol nebs, standing Duonebs -Hold Pulmicort for now  Carcinoid tumor -Adjuvant treatment with Xeloda is on hold for now while treating the lung cancer     DVT prophylaxis:  SCD's  Code Status: Full Code   Family Communication: Husband at bedside  Disposition Plan:     Consultants:   IR  CVTS- left message  Procedures:   Thoracentesis-- 200cc removed     Subjective: Breathing is better than yesterday-- wears 3L at home   Objective: Vitals:   02/18/18 2059 02/19/18 0029 02/19/18 0803 02/19/18 1023  BP:  (!) 110/53  114/73  Pulse:  82    Resp:      Temp:  98.4 F (36.9 C)    TempSrc:  Oral    SpO2: 97%  98%   Weight:      Height:        Intake/Output Summary (Last 24 hours) at 02/19/2018 1307 Last data filed at 02/19/2018 0900 Gross per 24 hour  Intake 915 ml  Output -  Net 915 ml   Filed Weights   02/18/18 0944  Weight: 58.1 kg (128 lb)    Examination:  General exam: frail/ill appearing Respiratory system: diminished, no wheezing Cardiovascular system: rrr Gastrointestinal system: +Bs, soft Central nervous system: Alert Extremities: moves all 4 ext     Data Reviewed: I have personally reviewed following labs and imaging studies  CBC: Recent Labs  Lab 02/18/18 0956 02/19/18 0333  WBC 9.0 7.6  NEUTROABS 6.8  --   HGB 10.6* 10.0*  HCT 33.3* 31.2*  MCV 99.4 99.7  PLT 224 409   Basic Metabolic Panel: Recent Labs    Lab 02/18/18 0956 02/19/18 0333  NA 135 137  K 4.1 3.8  CL 98* 100*  CO2 26 29  GLUCOSE 122* 105*  BUN <5* <5*  CREATININE 0.48 0.41*  CALCIUM 8.8* 8.5*   GFR: Estimated Creatinine Clearance: 51.8 mL/min (A) (by C-G formula based on SCr of 0.41 mg/dL (L)). Liver Function Tests: Recent Labs  Lab 02/18/18 0956  AST 22  ALT 9*  ALKPHOS 72  BILITOT 0.8  PROT 6.2*  ALBUMIN 3.3*   No results for input(s): LIPASE, AMYLASE in the last 168 hours. No results for input(s): AMMONIA in the last 168 hours. Coagulation Profile: No results for input(s): INR, PROTIME in the last 168 hours. Cardiac Enzymes: No results for input(s): CKTOTAL, CKMB, CKMBINDEX, TROPONINI in the last 168 hours. BNP (last 3 results) No results for input(s): PROBNP in the last 8760 hours. HbA1C: No results for input(s): HGBA1C in the last 72 hours. CBG: No results for input(s): GLUCAP in the last 168 hours. Lipid Profile: No results for input(s): CHOL, HDL, LDLCALC, TRIG, CHOLHDL, LDLDIRECT in the last 72 hours. Thyroid Function Tests: No results for input(s): TSH, T4TOTAL, FREET4, T3FREE, THYROIDAB in the last 72 hours. Anemia Panel: No results for input(s): VITAMINB12, FOLATE, FERRITIN, TIBC, IRON, RETICCTPCT in the last 72 hours. Urine analysis: No results found for: COLORURINE, APPEARANCEUR, LABSPEC, [place], GLUCOSEU, HGBUR, BILIRUBINUR, KETONESUR, PROTEINUR, UROBILINOGEN, NITRITE, LEUKOCYTESUR   )No results found for this or any previous visit (from the past 240 hour(s)).    Anti-infectives (From admission, onward)   None       Radiology Studies: Dg Chest 1 View  Result Date: 02/19/2018 CLINICAL DATA:  Status post right thoracentesis. EXAM: CHEST  1 VIEW COMPARISON:  Chest x-ray from yesterday. FINDINGS: The heart size and mediastinal contours are within normal limits. Normal pulmonary vascularity. Interval decrease in size of now small right pleural effusion, which remains slightly  loculated along the right chest wall and lung base. Right basilar atelectasis. No consolidation or pneumothorax. No acute osseous abnormality. IMPRESSION: Interval decrease in size of now small loculated right pleural effusion. Persistent right lower lobe atelectasis. No pneumothorax. Electronically Signed   By: Titus Dubin M.D.   On: 02/19/2018 10:49   Dg Chest 2 View  Result Date: 02/18/2018 CLINICAL DATA:  Chest pain for several days EXAM: CHEST - 2 VIEW COMPARISON:  02/13/2018 FINDINGS: Cardiac shadow is stable. Persistent right basilar atelectasis and effusion is noted similar to that seen on the prior exam. The paratracheal changes on the right are less well visualized and may be related to some interval patient rotation. The nodular change on the prior exam is not as well visualized. No bony abnormality is noted. IMPRESSION: Persistent changes in the right base. Previously seen nodules and paratracheal prominence are less prominent on the current exam likely related to patient rotation. Electronically  Signed   By: Inez Catalina M.D.   On: 02/18/2018 11:13   Ct Angio Chest Pe W And/or Wo Contrast  Result Date: 02/18/2018 CLINICAL DATA:  Shortness of breath, recent pleural effusion, lung cancer, labored breathing, sternal chest pain, former smoker, COPD, asthma EXAM: CT ANGIOGRAPHY CHEST WITH CONTRAST TECHNIQUE: Multidetector CT imaging of the chest was performed using the standard protocol during bolus administration of intravenous contrast. Multiplanar CT image reconstructions and MIPs were obtained to evaluate the vascular anatomy. CONTRAST:  120mL ISOVUE-370 IOPAMIDOL (ISOVUE-370) INJECTION 76% IV COMPARISON:  01/21/2018 FINDINGS: Cardiovascular: Atherosclerotic calcifications aorta, proximal great vessels and coronary arteries. Aorta normal caliber without aneurysm or dissection. Preferential opacification of systemic arterial rather than pulmonary arterial vessels. No large or central pulmonary  emboli are visualized. This exam is unable to exclude small/peripheral emboli due to suboptimal enhancement. No pericardial effusion. Mediastinum/Nodes: Esophagus unremarkable. Base of cervical region normal appearance. Mediastinal adenopathy present: 13 mm superior mediastinal node image 15 previously 6 mm; RIGHT paratracheal node 13 mm image 28 previously 11 mm; anterior mediastinal node 21 mm image 45 previously 20 mm; RIGHT paratracheal node 17 mm image 33 previously 16 mm. No axillary adenopathy. Lungs/pleura: Moderate-sized RIGHT pleural effusion partially loculated. Compressive atelectasis RIGHT lower lobe. Minimal scattered atelectasis in RIGHT middle lobe and lingula. Nodular density RIGHT middle lobe 8 mm image 40 unchanged. No acute infiltrate. RIGHT upper lobe nodule 6 mm image 19 stable. Tiny peripheral nodular density RIGHT upper lobe on previous exam appears represent more linear scar on current study. Upper Abdomen: Visualized upper abdomen unremarkable. Musculoskeletal: No acute osseous findings. Review of the MIP images confirms the above findings. IMPRESSION: No gross evidence of large/central pulmonary emboli identified, though due to suboptimal opacification unable to exclude small peripheral emboli. Moderate-sized partially loculated RIGHT pleural effusion with partial atelectasis of RIGHT lower lobe. Mediastinal adenopathy, minimally increased. Pulmonary nodules little changed. Scattered atherosclerotic calcifications including coronary arteries. Aortic Atherosclerosis (ICD10-I70.0). Electronically Signed   By: Lavonia Dana M.D.   On: 02/18/2018 14:45        Scheduled Meds: . docusate sodium  100 mg Oral BID  . ipratropium-albuterol  3 mL Nebulization Q6H  . lidocaine      . lidocaine       Continuous Infusions:    LOS: 0 days    Time spent: 35 min    Geradine Girt, DO [facility] Pager [phone]  If 7PM-7AM, please contact  night-coverage [email or web address] Password [facility] 02/19/2018, 1:07 PM

## 2018-02-19 NOTE — Procedures (Signed)
PROCEDURE SUMMARY:  Successful US guided diagnostic and therapeutic right thoracentesis. Yielded 200 mL of brown fluid. Pt tolerated procedure well. No immediate complications.  Specimen was sent for labs. CXR ordered.  Docia Barrier PA-C 02/19/2018 10:44 AM

## 2018-02-19 NOTE — Progress Notes (Signed)
71 Spoke with Mrs. Helmstetter she was admitted to [facility] yesterday with pleural effusion and respiratory failure today's FUN appointment cancelled.  [place], PA-C made of the above situation. 52 Spoke with Katie in [facility] appointment cancelled for today.

## 2018-02-19 NOTE — Progress Notes (Signed)
Patient currently off of the unit in IR for thoracentesis. VS stable upon transfer, patient is alert and oriented. Husband notified of procedure.

## 2018-02-19 NOTE — Care Management Note (Signed)
Case Management Note  Patient Details  Name: Melissa Golden MRN: [id number] Date of Birth: 1946-10-19  Subjective/Objective:                 Patient from home with spouse. Lives in mobile home. Has DME RW and cane, although does not use in the mobile home because she balances off furniture since there is not a lot of wide open space. She has home oxygen with [facility] at 3L baseline, currently at 4. Patient readmitted and not currently very interested in talking about HH, CM told her we could address it closer to time of DC.    Action/Plan:  CM will continue to follow.   Expected Discharge Date:                  Expected Discharge Plan:  Home/Self Care  In-House Referral:     Discharge planning Services     Post Acute Care Choice:    Choice offered to:     DME Arranged:    DME Agency:     HH Arranged:    HH Agency:     Status of Service:  In process, will continue to follow  If discussed at Long Length of Stay Meetings, dates discussed:    Additional Comments:  Carles Collet, RN 02/19/2018, 2:33 PM

## 2018-02-19 NOTE — Evaluation (Signed)
Physical Therapy Evaluation Patient Details Name: Melissa Golden MRN: [id number] DOB: 11-02-1946 Today's Date: 02/19/2018   History of Present Illness  72 y.o. female admitted on 02/18/18 for SOB.  Pt dx with acute on chronic resp failure with hypoxia, Pleural effusion s/p thoracentesis 02/19/18.  Pt with significant PMH of recent hospitalization for hemothorax with R CT insertion following mediastinoscopy and lymph node bx, recent dx of lung CA starting radiation and chemotherapy soon, COPD (on 3 L O2 [place] at baseline at home), and carcinoid tumor.    Clinical Impression  Pt is generally weak and deconditioned with poor tolerance of mobility.  She relayed to me that she went to [employer] with her husband recently and got stuck at the salad bar, unable to physically get back to her table.  She is just not able to tolerate much moving both from a strength and a respiratory standpoint.  She would benefit from therapy at home if she is agreeable and a 4 wheeled RW with a seat to help with community access to make sure she does not get "stuck" some where again without a place to rest.  PT to follow acutely for deficits listed below.       Follow Up Recommendations Home health PT;Supervision for mobility/OOB    Equipment Recommendations  Other (comment)(if pt agrees to expense 4 wheeled RW with a seat)    Recommendations for Other Services   NA    Precautions / Restrictions Precautions Precautions: Fall;Other (comment)(monitor O2)      Mobility  Bed Mobility Overal bed mobility: Modified Independent                Transfers Overall transfer level: Needs assistance   Transfers: Sit to/from Stand Sit to Stand: Min guard         General transfer comment: min guard assist for safety.   Ambulation/Gait Ambulation/Gait assistance: Min guard Ambulation Distance (Feet): 20 Feet Assistive device: None Gait Pattern/deviations: Step-through pattern;Staggering left;Staggering  right Gait velocity: decreased Gait velocity interpretation: Below normal speed for age/gender General Gait Details: mildly staggering gait pattern.  I can see that she would likely furniture walk at home.  We spoke about a rollator for community access as she still likes to go to [employer].  Pt reports that she is more limited by her lungs/DOE than her leg weakness and balance deficits.          Balance Overall balance assessment: Needs assistance Sitting-balance support: Feet supported;No upper extremity supported Sitting balance-Leahy Scale: Good     Standing balance support: Single extremity supported Standing balance-Leahy Scale: Fair                               Pertinent Vitals/Pain Pain Assessment: 0-10 Pain Score: 2  Pain Location: right ribs "where the stitch is" Pain Descriptors / Indicators: Jabbing Pain Intervention(s): Limited activity within patient's tolerance;Monitored during session;Repositioned    Home Living Family/patient expects to be discharged to:: Private residence Living Arrangements: Spouse/significant other Available Help at Discharge: Family;Available 24 hours/day Type of Home: House Home Access: Stairs to enter Entrance Stairs-Rails: Right;Left;Can reach both Entrance Stairs-Number of Steps: 2 Home Layout: One level Home Equipment: Shower seat - built in;Walker - 2 wheels;Cane - single point      Prior Function Level of Independence: Needs assistance   Gait / Transfers Assistance Needed: furniture walks at home  ADL's / Homemaking Assistance Needed: husband does  most of it, helps her with her socks, she sponge bathes as getting into the shower it too exhausting.   Comments: uses 3 L O2 [place] at home at baseline, has not driven in 3 weeks "my husband won't allow me to"     Hand Dominance   Dominant Hand: Right    Extremity/Trunk Assessment   Upper Extremity Assessment Upper Extremity Assessment: RUE deficits/detail RUE  Deficits / Details: difficult to raise R arm as her chest tube insertion site was stitched shut on this side and pulls with movement.  This hurts more than today's thoracentesis site.     Lower Extremity Assessment Lower Extremity Assessment: Generalized weakness    Cervical / Trunk Assessment Cervical / Trunk Assessment: Normal  Communication   Communication: No difficulties  Cognition Arousal/Alertness: Awake/alert Behavior During Therapy: WFL for tasks assessed/performed Overall Cognitive Status: Within Functional Limits for tasks assessed                                               Assessment/Plan    PT Assessment Patient needs continued PT services  PT Problem List Decreased strength;Decreased activity tolerance;Decreased balance;Decreased mobility;Decreased knowledge of use of DME;Cardiopulmonary status limiting activity;Pain       PT Treatment Interventions DME instruction;Gait training;Stair training;Functional mobility training;Therapeutic activities;Balance training;Therapeutic exercise;Patient/family education    PT Goals (Current goals can be found in the Care Plan section)  Acute Rehab PT Goals Patient Stated Goal: to be able to go home and breathe better PT Goal Formulation: With patient Time For Goal Achievement: 03/05/18 Potential to Achieve Goals: Good    Frequency Min 3X/week           AM-PAC PT "6 Clicks" Daily Activity  Outcome Measure Difficulty turning over in bed (including adjusting bedclothes, sheets and blankets)?: None Difficulty moving from lying on back to sitting on the side of the bed? : A Little Difficulty sitting down on and standing up from a chair with arms (e.g., wheelchair, bedside commode, etc,.)?: Unable Help needed moving to and from a bed to chair (including a wheelchair)?: A Little Help needed walking in hospital room?: A Little Help needed climbing 3-5 steps with a railing? : A Little 6 Click Score: 17     End of Session Equipment Utilized During Treatment: Oxygen(4 L O2 [place]) Activity Tolerance: No increased pain;Patient limited by fatigue;Other (comment)(limited by DOE with mobility. ) Patient left: in bed;with call bell/phone within reach   PT Visit Diagnosis: Muscle weakness (generalized) (M62.81);Difficulty in walking, not elsewhere classified (R26.2)    Time: 1410-1445 PT Time Calculation (min) (ACUTE ONLY): 35 min   Charges:          Wells Guiles B. Amonda Brillhart, PT, DPT [phone]    PT Evaluation $PT Eval Moderate Complexity: 1 Mod PT Treatments $Gait Training: 8-22 mins   02/19/2018, 2:54 PM

## 2018-02-20 DIAGNOSIS — D3A020 Benign carcinoid tumor of the appendix: Secondary | ICD-10-CM

## 2018-02-20 MED ORDER — IPRATROPIUM-ALBUTEROL 0.5-2.5 (3) MG/3ML IN SOLN: 3.0000 mL | Freq: Three times a day (TID) | RESPIRATORY_TRACT | Status: DC

## 2018-02-20 MED ORDER — SODIUM CHLORIDE 0.9 % IV SOLN
1.0000 g | Freq: Three times a day (TID) | INTRAVENOUS | Status: DC
  Administered 2018-02-20: 1 g via INTRAVENOUS
  Filled 2018-02-20 (×2): qty 1

## 2018-02-20 MED ORDER — DOCUSATE SODIUM 100 MG PO CAPS: 100.0000 mg | ORAL_CAPSULE | Freq: Two times a day (BID) | ORAL | 0 refills | Status: DC

## 2018-02-20 NOTE — Care Management Note (Signed)
Case Management Note Previous Note Created By Carles Collet  Patient Details  Name: Melissa Golden MRN: [id number] Date of Birth: Sep 09, 1946  Subjective/Objective:                 Patient from home with spouse. Lives in mobile home. Has DME RW and cane, although does not use in the mobile home because she balances off furniture since there is not a lot of wide open space. She has home oxygen with [facility] at 3L baseline, currently at 4. Patient readmitted and not currently very interested in talking about HH, CM told her we could address it closer to time of DC.    Action/Plan:  CM will continue to follow.   Expected Discharge Date:  02/20/18               Expected Discharge Plan:     In-House Referral:     Discharge planning Services  CM Consult  Post Acute Care Choice:    Choice offered to:     DME Arranged:    DME Agency:     HH Arranged:    HH Agency:     Status of Service:  Completed, signed off  If discussed at [employer] of Stay Meetings, dates discussed:    Additional Comments: 02/20/2018 Pt to discharge home today.  Pt declined both HHPT and Rollator.  Pt will discharge home via private vehicle driven by husband.  Pt/husband confirmed she has portable oxygen tank for transport home - pt will discharge home on baseline oxygen need of 3L.  [name], RN 02/20/2018, 11:50 AM

## 2018-02-20 NOTE — Discharge Summary (Signed)
Physician Discharge Summary  Melissa Golden XKG:[id number] DOB: [birth year] DOA: 02/18/2018  PCP: Rory Percy, MD  Admit date: 02/18/2018 Discharge date: 02/20/2018   Recommendations for Outpatient Follow-Up:   1. Patient did not want home health 2. Patient to re-schedule appointment with Dr. Cyndia Bent for next week 3. Culture from thoracentesis pending 4. Resume home O2   Discharge Diagnosis:   Principal Problem:   Acute on chronic respiratory failure with hypoxia (HCC) Active Problems:   Malignant neoplasm of right upper lobe of lung (HCC)   Pleural effusion   Carcinoid tumor of appendix   COPD (chronic obstructive pulmonary disease) ([place])   Discharge disposition:  Home.  Discharge Condition: Improved.  Diet recommendation:  Regular.  Wound care: None.   History of Present Illness:   Melissa Golden is a 72 y.o. 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  She is currently scheduled to begin concurrent radiation and Taxol/carboplatin chemotherapy.  She initially started chemotherapy for the goblet cell tumor and then was found to have a dominant lung lesion for which mediastinoscopy and LN biopsy (02/04/18) led to a hemothorax.  Chest tube was placed and the patient was transfused.  While at home, she started getting worsening SOB with lots of pain in her right upper chest.  This has been ongoing for the last 4 days.  She has no energy, extremely hard to get out of bed.  She really is unable to walk around at all.  She is unable to bend over because she can't get back up.  She can't sleep on her right side because it hurts too much.  SOB is even present at rest.  Her husband laid next to her last night and her  breathing "was chunky."  Dr. Prescott Gum evaluated her this AM in the ER and told her she had fluid in her right lung.       Hospital Course by Problem:   Acute on chronic respiratory failure with hypoxia -patient with underlying COPD on home O2: 3L presenting with worsening SOB, hypoxia -She has a recent hospitalization after developing hemothorax following mediastinoscopy and LN biopsy -Has recent diagnosis of lung cancer -Current hypoxia appears to be due to worsening pleural effusion (see below)  Pleural effusion-- loculated per repeat x ray -Now presenting withworseningright-sided effusion occupying 1/3of her chest -s/p thoracentesis with 200 ml removed -Pain control with oxycodone and morphine prn -spoke with Dr. Cyndia Bent-- only option would be surgery and patient does not want nor does he think she needs at this time.  He plans to follow with her as an outpateint  Lung CA -Recent diagnosis of NSCLC, locally advanced -She is planning to start concurrent radiation and Taxol/carboplatin chemotherapy next week  COPD -She is on 3L home O2 at baseline -resume home regimen  Carcinoid tumor -Adjuvant treatment with Xeloda is on hold for now while treating the lung cancer    Patient has overall poor prognosis    Medical Consultants:    None.   Discharge Exam:   Vitals:   02/20/18 0000 02/20/18 0757  BP: (!) 102/49 108/60  Pulse: 84 85  Resp: 18 19  Temp: 98.4 F (36.9 C) 99 F (37.2 C)  SpO2: 94% 95%   Vitals:   02/19/18  1607 02/19/18 2047 02/20/18 0000 02/20/18 0757  BP: (!) 109/51  (!) 102/49 108/60  Pulse: 87  84 85  Resp: 20  18 19   Temp: 99.1 F (37.3 C)  98.4 F (36.9 C) 99 F (37.2 C)  TempSrc: Oral  Oral Oral  SpO2: 98% 97% 94% 95%  Weight:      Height:        Gen:  NAD-- breathing feels basically like her baseline-- back on 3L O2  The results of significant diagnostics from this hospitalization (including imaging, microbiology,  ancillary and laboratory) are listed below for reference.     Procedures and Diagnostic Studies:   Dg Chest 1 View  Result Date: 02/19/2018 CLINICAL DATA:  Status post right thoracentesis. EXAM: CHEST  1 VIEW COMPARISON:  Chest x-ray from yesterday. FINDINGS: The heart size and mediastinal contours are within normal limits. Normal pulmonary vascularity. Interval decrease in size of now small right pleural effusion, which remains slightly loculated along the right chest wall and lung base. Right basilar atelectasis. No consolidation or pneumothorax. No acute osseous abnormality. IMPRESSION: Interval decrease in size of now small loculated right pleural effusion. Persistent right lower lobe atelectasis. No pneumothorax. Electronically Signed   By: Titus Dubin M.D.   On: 02/19/2018 10:49   Ir Thoracentesis Asp Pleural Space W/img Guide  Result Date: 02/19/2018 INDICATION: Patient with right pleural effusion. Request is made for diagnostic and therapeutic thoracentesis. EXAM: ULTRASOUND GUIDED DIAGNOSTIC AND THERAPEUTIC RIGHT THORACENTESIS MEDICATIONS: 10 mL 2% lidocaine COMPLICATIONS: None immediate. PROCEDURE: An ultrasound guided thoracentesis was thoroughly discussed with the patient and questions answered. The benefits, risks, alternatives and complications were also discussed. The patient understands and wishes to proceed with the procedure. Written consent was obtained. Ultrasound was performed to localize and mark an adequate pocket of fluid in the right chest. The area was then prepped and draped in the normal sterile fashion. 2% lidocaine was used for local anesthesia. Under ultrasound guidance a Safe-T-Centesis catheter was introduced. Thoracentesis was performed. The catheter was removed and a dressing applied. FINDINGS: A total of approximately 200 mL of brown fluid was removed. Samples were sent to the laboratory as requested by the clinical team. IMPRESSION: Successful ultrasound guided  diagnostic and therapeutic right thoracentesis yielding 200 mL of pleural fluid. Read by: Brynda Greathouse PA-C Electronically Signed   By: Aletta Edouard M.D.   On: 02/19/2018 12:18     Labs:   Basic Metabolic Panel: Recent Labs  Lab 02/18/18 0956 02/19/18 0333  NA 135 137  K 4.1 3.8  CL 98* 100*  CO2 26 29  GLUCOSE 122* 105*  BUN <5* <5*  CREATININE 0.48 0.41*  CALCIUM 8.8* 8.5*   GFR Estimated Creatinine Clearance: 51.8 mL/min (A) (by C-G formula based on SCr of 0.41 mg/dL (L)). Liver Function Tests: Recent Labs  Lab 02/18/18 0956  AST 22  ALT 9*  ALKPHOS 72  BILITOT 0.8  PROT 6.2*  ALBUMIN 3.3*   No results for input(s): LIPASE, AMYLASE in the last 168 hours. No results for input(s): AMMONIA in the last 168 hours. Coagulation profile No results for input(s): INR, PROTIME in the last 168 hours.  CBC: Recent Labs  Lab 02/18/18 0956 02/19/18 0333  WBC 9.0 7.6  NEUTROABS 6.8  --   HGB 10.6* 10.0*  HCT 33.3* 31.2*  MCV 99.4 99.7  PLT 224 202   Cardiac Enzymes: No results for input(s): CKTOTAL, CKMB, CKMBINDEX, TROPONINI in the last 168 hours. BNP: Invalid input(s):  POCBNP CBG: No results for input(s): GLUCAP in the last 168 hours. D-Dimer No results for input(s): DDIMER in the last 72 hours. Hgb A1c No results for input(s): HGBA1C in the last 72 hours. Lipid Profile No results for input(s): CHOL, HDL, LDLCALC, TRIG, CHOLHDL, LDLDIRECT in the last 72 hours. Thyroid function studies No results for input(s): TSH, T4TOTAL, T3FREE, THYROIDAB in the last 72 hours.  Invalid input(s): FREET3 Anemia work up No results for input(s): VITAMINB12, FOLATE, FERRITIN, TIBC, IRON, RETICCTPCT in the last 72 hours. Microbiology Recent Results (from the past 240 hour(s))  Gram stain     Status: None   Collection Time: 02/19/18 10:53 AM  Result Value Ref Range Status   Specimen Description PLEURAL  Final   Special Requests FLUID RIGHT  Final   Gram Stain   Final     MODERATE WBC PRESENT, PREDOMINANTLY PMN NO ORGANISMS SEEN Performed at [facility], [address], [place], [place] [address]    Report Status 02/19/2018 FINAL  Final     Discharge Instructions:   Discharge Instructions    Diet general   Complete by:  As directed    Discharge instructions   Complete by:  As directed    Resume home O2 -reschedule appointment with Dr. Cyndia Bent for next week   Increase activity slowly   Complete by:  As directed      Allergies as of 02/20/2018   No Known Allergies     Medication List    STOP taking these medications   capecitabine 500 MG tablet Commonly known as:  XELODA   dexamethasone 4 MG tablet Commonly known as:  DECADRON     TAKE these medications   albuterol 108 (90 Base) MCG/ACT inhaler Commonly known as:  PROVENTIL HFA;VENTOLIN HFA Inhale 2 puffs into the lungs 3 (three) times daily. Shortness of Breath   budesonide 180 MCG/ACT inhaler Commonly known as:  PULMICORT Inhale 2 puffs into the lungs 2 (two) times daily.   docusate sodium 100 MG capsule Commonly known as:  COLACE Take 1 capsule (100 mg total) by mouth 2 (two) times daily.   ibuprofen 200 MG tablet Commonly known as:  ADVIL,MOTRIN Take 400 mg by mouth every 6 (six) hours as needed for moderate pain. Pain   multivitamin with minerals Tabs tablet Take 1 tablet by mouth daily.   oxyCODONE 5 MG immediate release tablet Commonly known as:  ROXICODONE Take 1 tablet (5 mg total) by mouth every 8 (eight) hours as needed. What changed:  reasons to take this      Follow-up Information    Rory Percy, MD Follow up in 1 week(s).   Specialty:  Family Medicine Contact information: [place] [address] [phone]        Gaye Pollack, MD Follow up.   Specialty:  Cardiothoracic Surgery Why:  reschedule appointment for next week Contact information: [place] [address] [phone]            Time  coordinating discharge: 35 min  Signed:  Geradine Girt   [facility] 02/20/2018, 10:53 AM

## 2018-02-20 NOTE — Progress Notes (Signed)
Pt informed and agreed to discharge. Pt alert and oriented, vital signs stable. O2 saturation 97% on 3 liters nasal cannula which is pt's baseline. Went over discharge instructions with patient and patient's husband. Husband had home O2 in car for ride home. Pt wheeled off unit in wheel chair with clothing, cell phone, jewelry, and purse by Nurse Tech.

## 2018-02-24 ENCOUNTER — Other Ambulatory Visit: Payer: Self-pay | Admitting: Oncology

## 2018-02-24 LAB — CULTURE, BODY FLUID-BOTTLE

## 2018-02-24 LAB — CULTURE, BODY FLUID W GRAM STAIN -BOTTLE: Culture: NO GROWTH

## 2018-02-27 ENCOUNTER — Other Ambulatory Visit: Payer: Medicare Other

## 2018-02-27 ENCOUNTER — Other Ambulatory Visit: Payer: Self-pay | Admitting: Surgery

## 2018-02-27 ENCOUNTER — Other Ambulatory Visit: Payer: Self-pay | Admitting: Emergency Medicine

## 2018-02-27 ENCOUNTER — Ambulatory Visit: Payer: Medicare Other

## 2018-02-27 ENCOUNTER — Ambulatory Visit
Admission: RE | Admit: 2018-02-27 | Discharge: 2018-02-27 | Disposition: A | Discharge status: Home / Self Care | Payer: Medicare Other | Source: Ambulatory Visit | Attending: Radiation Oncology | Admitting: Radiation Oncology

## 2018-02-27 DIAGNOSIS — C3411 Malignant neoplasm of upper lobe, right bronchus or lung: Secondary | ICD-10-CM | POA: Insufficient documentation

## 2018-02-27 DIAGNOSIS — Z510 Encounter for antineoplastic radiation therapy: Secondary | ICD-10-CM | POA: Insufficient documentation

## 2018-02-28 ENCOUNTER — Other Ambulatory Visit: Payer: Self-pay

## 2018-02-28 ENCOUNTER — Ambulatory Visit
Admission: RE | Admit: 2018-02-28 | Discharge: 2018-02-28 | Disposition: A | Discharge status: Home / Self Care | Payer: Medicare Other | Source: Ambulatory Visit | Attending: Surgery | Admitting: Surgery

## 2018-02-28 ENCOUNTER — Ambulatory Visit: Payer: Medicare Other

## 2018-02-28 ENCOUNTER — Other Ambulatory Visit: Payer: Medicare Other

## 2018-02-28 ENCOUNTER — Ambulatory Visit (INDEPENDENT_AMBULATORY_CARE_PROVIDER_SITE_OTHER): Payer: Medicare Other | Admitting: Surgery

## 2018-02-28 ENCOUNTER — Encounter (HOSPITAL_COMMUNITY): Payer: Self-pay | Admitting: *Deleted

## 2018-02-28 ENCOUNTER — Encounter: Payer: Self-pay | Admitting: Surgery

## 2018-02-28 ENCOUNTER — Other Ambulatory Visit: Payer: Self-pay | Admitting: *Deleted

## 2018-02-28 VITALS — BP 102/71 | HR 104 | Resp 16 | Ht 62.0 in | Wt 125.0 lb

## 2018-02-28 DIAGNOSIS — C3411 Malignant neoplasm of upper lobe, right bronchus or lung: Secondary | ICD-10-CM

## 2018-02-28 DIAGNOSIS — C3491 Malignant neoplasm of unspecified part of right bronchus or lung: Secondary | ICD-10-CM | POA: Diagnosis not present

## 2018-02-28 DIAGNOSIS — J90 Pleural effusion, not elsewhere classified: Secondary | ICD-10-CM | POA: Diagnosis not present

## 2018-02-28 NOTE — H&P (Signed)
[address]       [place] [address]             [phone]      Cardiothoracic surgery admission history and physical:   Melissa Golden is an 72 y.o. female.    Chief Complaint: Moderate right pleural effusion with compressive atelectasis and shortness of breath HPI:   The patient is a 72 year old woman with a 100 pk-year smoking history who was admitted to [facility] at the end of November 2017with acute respiratory failure with hypoxemia. She had a CXR that ruled out pneumonia and she was felt to have an exacerbation of COPD. She was treated with bipap, nebs and oxygen with improvement and she was sent home on oxygen. She had a CTA of the chest on admission which showed no evidence of PE but did show a 0.9 x 0.7 cm spiculated density in the RUL which had increased in size compared to a prior CT on 12/14/2014. There was a small stellate density in the subpleural RUL that was unchanged. There was mild ground glass opacity in the lingula and mild consolidation in the posterior LLL that were felt to be consistent with atelectasis or infiltrate. There was a focus of ground-glass density in the lateral subpleural LUL that was felt to be consistent with infection or inflammation. There was a 1.1 cm hyperenhancing lesion in the posterior right hepatic lobe. She underwent a PET scan on 12/20/2017which showed mild hypermetabolism in the 0.8 cm RUL apical lesion with an SUV max of 2.7. There were at least 7 other tiny pulmonary nodules measuring up to 0.5 mm scattered throughout both lungs, all below PET resolution. There was a solitary hypermetabolic non-enlarged right inguinal lymph node that was felt to be reactive.She was not felt to be a candidate for lung resection due to severe COPD, chronic hypoxemia and dyspnea with minimal activity. The dominant lung lesion was small so we decided to follow it. The dominant lesion was stable at 6 x 8 mm on CT in 11/2016 so we  continued following this. 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 The RUL dominant lung lesion was slightly larger on a follow up CT in 09/2017. She had a follow up PET scan on 10/17/2017 that showed three hypermetabolic nodules in the RUL that were increased in size and metabolic activity compared to her prior PET but there was no mediastinal adenopathy. She had a CT guided needle biopsy of the dominant lung lesion that showed non-small cell lung cancer that was adenocarcinoma. She has started Xeloda on 01/07/2018 for her appendiceal adenocarcinoma. A follow up chest CT on 01/21/2018 showed new lymphadenopathy in the right paratracheal and pretracheal region measuring 2 cm and suspicious for metastasis.  He subsequently underwent mediastinoscopy with lymph node biopsy on 02/04/2018.  That procedure was complicated by development of a large right hemothorax noted postoperatively on chest x-ray.  She had a right chest tube placed draining 850 cc of blood immediately and then a small amount of drainage after that.  The chest tube was left in place for couple days and the drainage tailed off as expected.  The chest tube was removed and her initial chest x-ray afterwards showed a very small residual right pleural effusion. She was readmitted by the medical service on 02/18/2018 with increased shortness of breath and right chest and back pain.  A follow-up chest x-ray showed  a moderate size right pleural effusion.  She had a CT scan of the chest which showed a moderate size right pleural effusion with partial loculation and some compressive atelectasis of the right lower lobe.  She had a right thoracentesis performed by IR and only removed 200 cc of brown fluid.  Cultures of this fluid were negative.  She said that she felt better for a while after the thoracentesis and was discharged home.  She reports that since going home  she has had more shortness of breath and is using oxygen around the clock like she has been for quite some time.  Her husband reports that she has been weak and is not eating very well and is always uncomfortable.     Past Medical History:  Diagnosis Date  . Asthma   . Cancer ([place]) 2017   colon   chemo tx  . COPD (chronic obstructive pulmonary disease) ([place])    wears 3L home O2  . Ileus following gastrointestinal surgery ([place]) 09/12/2017   required TPN support  . Shortness of breath    with exertion    Past Surgical History:  Procedure Laterality Date  . ABDOMINAL HYSTERECTOMY    . CATARACT EXTRACTION W/ INTRAOCULAR LENS IMPLANT     Right  . CATARACT EXTRACTION W/PHACO  01/02/2013   Procedure: CATARACT EXTRACTION PHACO AND INTRAOCULAR LENS PLACEMENT (IOC);  Surgeon: Tonny Branch, MD;  Location: [facility];  Service: Ophthalmology;  Laterality: Left;  CDE: 16.19  . EYE SURGERY    . IR THORACENTESIS ASP PLEURAL SPACE W/IMG GUIDE  02/19/2018  . LAPAROSCOPIC APPENDECTOMY  09/12/2017  . laparoscopic hemicolectomy Right 12/07/2017   DR. Stitzenberg and Robinson    . LYMPH NODE BIOPSY N/A 02/04/2018   Procedure: LYMPH NODE BIOPSY;  Surgeon: Gaye Pollack, MD;  Location: [facility];  Service: Thoracic;  Laterality: N/A;  . MEDIASTINOSCOPY N/A 02/04/2018   Procedure: MEDIASTINOSCOPY;  Surgeon: Gaye Pollack, MD;  Location: [facility];  Service: Thoracic;  Laterality: N/A;    Family History  Problem Relation Age of Onset  . Cancer Mother   . Heart attack Father   . Cancer - Colon Sister    Social History:  reports that she quit smoking about 16 months ago. Her smoking use included cigarettes. She has a 102.00 pack-year smoking history. She has never used smokeless tobacco. She reports that she drinks about 3.6 oz of alcohol per week. She reports that she does not use drugs.  Allergies: No Known Allergies  No medications prior to admission.    No results found for this or any  previous visit (from the past 48 hour(s)). Dg Chest 2 View  Result Date: 02/28/2018 CLINICAL DATA:  Follow-up right lung cancer.  Shortness of breath. EXAM: CHEST - 2 VIEW COMPARISON:  02/19/2018 chest radiograph FINDINGS: Increased densities at the right chest base compared to the previous examination. Findings compatible with increased right pleural fluid. Upper lungs remain clear. Heart and mediastinum are stable and within normal limits. The trachea is midline. IMPRESSION: Increased right pleural fluid. Right pleural effusion is small to moderate in size. Electronically Signed   By: Markus Daft M.D.   On: 02/28/2018 15:06    Review of Systems  Constitutional: Positive for malaise/fatigue and weight loss. Negative for chills and fever.  HENT: Negative.   Eyes: Negative.   Respiratory: Positive for cough and shortness of breath. Negative for sputum production.   Cardiovascular: Positive for chest  pain and orthopnea.  Gastrointestinal: Negative.   Genitourinary: Negative.   Musculoskeletal: Positive for back pain.  Skin: Negative.   Neurological: Negative.   Endo/Heme/Allergies: Negative.   Psychiatric/Behavioral: Negative.     There were no vitals taken for this visit. Physical Exam  Constitutional: She is oriented to person, place, and time.  Thin chronically ill-appearing woman in no distress.  HENT:  Head: Normocephalic and atraumatic.  Mouth/Throat: Oropharynx is clear and moist.  Eyes: Pupils are equal, round, and reactive to light. EOM are normal.  Neck: Normal range of motion. Neck supple. No thyromegaly present.  Neck incision healing well  Cardiovascular: Normal rate, regular rhythm and normal heart sounds.  No murmur heard. Respiratory: She has no wheezes.  Increased work of breathing.  Decreased breath sounds over the right lower lobe.  Right chest tube site well-healed  GI: Soft. Bowel sounds are normal. She exhibits no distension. There is no tenderness.    Musculoskeletal: She exhibits no edema.  Lymphadenopathy:    She has no cervical adenopathy.  Neurological: She is alert and oriented to person, place, and time.  Skin: Skin is warm and dry.  Psychiatric: She has a normal mood and affect.    CLINICAL DATA:  Follow-up right lung cancer.  Shortness of breath.  EXAM: CHEST - 2 VIEW  COMPARISON:  02/19/2018 chest radiograph  FINDINGS: Increased densities at the right chest base compared to the previous examination. Findings compatible with increased right pleural fluid. Upper lungs remain clear. Heart and mediastinum are stable and within normal limits. The trachea is midline.  IMPRESSION: Increased right pleural fluid. Right pleural effusion is small to moderate in size.   Electronically Signed   By: Markus Daft M.D.   On: 02/28/2018 15:06  Assessment  She has a moderate size right pleural effusion with compressive atelectasis of the right lower lobe which appears somewhat worse than her chest x-ray immediately after the thoracentesis.  The CT scan of the chest done on 02/18/2018 showed partial loculation of this pleural effusion and most likely she had some residual blood from her hemothorax followed by a sympathetic effusion in response to that.  With her severe COPD I think even a small pleural effusion with compressive atelectasis could contribute to more shortness of breath.  I think it is probably best to completely drain this effusion to allow full reexpansion of her lung.  I am concerned that if we leave it alone she is going to have a progressive downhill course and end up with pneumonia or empyema.  She is scheduled to begin radiation therapy next week but I think that would probably have to be postponed a little to allow this problem to be resolved.  I think that a right VATS should allow adequate exposure to remove this effusion.  If there is significant loculation that could require a small thoracotomy.  I discussed  the operative procedure with the patient and her husband including alternatives, benefits, and risks including but not limited to bleeding, blood transfusion, infection, injury to the lung, prolonged air leak, respiratory failure, and recurrence of the effusion.  They understand and would like to proceed with surgery.  We will plan to do this tomorrow 03/01/2018.  Plan:  Right video-assisted thoracoscopic surgery for drainage of right pleural effusion tomorrow 03/01/2018.    Gaye Pollack, MD 02/28/2018, 6:05 PM

## 2018-02-28 NOTE — Progress Notes (Signed)
HPI:  The patient returns today for follow-up status post mediastinoscopy with lymph node biopsy on 02/04/2018.  This was complicated by development of postoperative right hemothorax requiring chest tube drainage and monitoring in the intensive care unit.  The chest tube was removed after a couple days and she was discharged home. She had a small right pleural effusion at discharge. She was readmitted by the medical service on 02/18/2018 with increased shortness of breath and right chest and back pain.  A follow-up chest x-ray showed a moderate size right pleural effusion.  She had a CT scan of the chest which showed a moderate size right pleural effusion with partial loculation and some compressive atelectasis of the right lower lobe.  She had a right thoracentesis performed by IR and only removed 200 cc of brown fluid.  Cultures of this fluid were negative.  She said that she felt better for a while after the thoracentesis and was discharged home.  She reports that since going home she has had more shortness of breath and is using oxygen around the clock like she has been for quite some time.  Her husband reports that she has been weak and is not eating very well and is always uncomfortable.    Current Outpatient Medications  Medication Sig Dispense Refill  . albuterol (PROVENTIL HFA;VENTOLIN HFA) 108 (90 BASE) MCG/ACT inhaler Inhale 2 puffs into the lungs 3 (three) times daily. Shortness of Breath     . budesonide (PULMICORT) 180 MCG/ACT inhaler Inhale 2 puffs into the lungs 2 (two) times daily.    Marland Kitchen docusate sodium (COLACE) 100 MG capsule Take 1 capsule (100 mg total) by mouth 2 (two) times daily. 10 capsule 0  . ibuprofen (ADVIL,MOTRIN) 200 MG tablet Take 400 mg by mouth every 6 (six) hours as needed for moderate pain. Pain     . Multiple Vitamin (MULTIVITAMIN WITH MINERALS) TABS Take 1 tablet by mouth daily.    Marland Kitchen oxyCODONE (ROXICODONE) 5 MG immediate release tablet Take 1 tablet (5 mg total)  by mouth every 8 (eight) hours as needed. (Patient taking differently: Take 5 mg by mouth every 8 (eight) hours as needed for moderate pain. ) 10 tablet 0   No current facility-administered medications for this visit.      Physical Exam:  BP 102/71 (BP Location: Left Arm, Patient Position: Sitting, Cuff Size: Large)   Pulse (!) 104   Resp 16   Ht 5\' 2"  (1.575 m)   Wt 125 lb (56.7 kg)   SpO2 95% Comment: ON 3L O2  BMI 22.86 kg/m  She looks weak. Lung exam reveals decreased breath sounds over the right lower lobe. The right chest tube site is well-healed. Cardiac exam shows a regular rate and rhythm with normal heart sounds.  Diagnostic Tests:  CLINICAL DATA:  Follow-up right lung cancer.  Shortness of breath.  EXAM: CHEST - 2 VIEW  COMPARISON:  02/19/2018 chest radiograph  FINDINGS: Increased densities at the right chest base compared to the previous examination. Findings compatible with increased right pleural fluid. Upper lungs remain clear. Heart and mediastinum are stable and within normal limits. The trachea is midline.  IMPRESSION: Increased right pleural fluid. Right pleural effusion is small to moderate in size.   Electronically Signed   By: Markus Daft M.D.   On: 02/28/2018 15:06   Impression:  She has a moderate size right pleural effusion with compressive atelectasis of the right lower lobe which appears somewhat worse than  her chest x-ray immediately after the thoracentesis.  The CT scan of the chest done on 02/18/2018 showed partial loculation of this pleural effusion and most likely she had some residual blood from her hemothorax followed by a sympathetic effusion in response to that.  With her severe COPD I think even a small pleural effusion with compressive atelectasis could contribute to more shortness of breath.  I think it is probably best to completely drain this effusion to allow full reexpansion of her lung.  I am concerned that if we leave  it alone she is going to have a progressive downhill course and end up with pneumonia or empyema.  She is scheduled to begin radiation therapy next week but I think that would probably have to be postponed a little to allow this problem to be resolved.  I think that a right VATS should allow adequate exposure to remove this effusion.  If there is significant loculation that could require a small thoracotomy.  I discussed the operative procedure with the patient and her husband including alternatives, benefits, and risks including but not limited to bleeding, blood transfusion, infection, injury to the lung, prolonged air leak, respiratory failure, and recurrence of the effusion.  They understand and would like to proceed with surgery.  We will plan to do this tomorrow 03/01/2018.  Plan:  Right video-assisted thoracoscopic surgery for drainage of right pleural effusion tomorrow 03/01/2018.   I spent 15 minutes performing this established patient evaluation and > 50% of this time was spent face to face counseling and coordinating the care of this patient's recurrent moderate right pleural effusion.    Gaye Pollack, MD [facility] [phone]

## 2018-02-28 NOTE — Progress Notes (Signed)
Pt denies any acute cardiopulmonary issues.  Pt denies having a stress test, echo and cardiac cath. Pt made aware to stop taking vitamins, fish oil and herbal medications. Do not take any NSAIDs ie: Ibuprofen, Advil, Naproxen (Aleve), Motrin, BC and Goody Powder. Pt verbalized understanding of all pre-op instructions.

## 2018-02-28 NOTE — Anesthesia Preprocedure Evaluation (Addendum)
Anesthesia Evaluation  Patient identified by MRN, date of birth, ID band Patient awake    Reviewed: Allergy & Precautions, NPO status , Patient's Chart, lab work & pertinent test results  Airway Mallampati: I  TM Distance: >3 FB Neck ROM: Full    Dental  (+) Edentulous Upper, Edentulous Lower, Dental Advisory Given   Pulmonary shortness of breath, at rest and Long-Term Oxygen Therapy, COPD,  COPD inhaler and oxygen dependent, former smoker,    breath sounds clear to auscultation       Cardiovascular negative cardio ROS   Rhythm:Regular Rate:Normal     Neuro/Psych    GI/Hepatic Neg liver ROS, Colon cancer Appendiceal carcinoid   Endo/Other  negative endocrine ROS  Renal/GU negative Renal ROS     Musculoskeletal   Abdominal   Peds  Hematology  (+) Blood dyscrasia (Hb 10.0), anemia ,   Anesthesia Other Findings   Reproductive/Obstetrics                           Anesthesia Physical Anesthesia Plan  ASA: III  Anesthesia Plan: General   Post-op Pain Management:    Induction: Intravenous  PONV Risk Score and Plan: 3 and Ondansetron, Dexamethasone, Treatment may vary due to age or medical condition and Diphenhydramine  Airway Management Planned: Double Lumen EBT  Additional Equipment: Arterial line, Ultrasound Guidance Line Placement and CVP  Intra-op Plan:   Post-operative Plan: Extubation in OR and Possible Post-op intubation/ventilation  Informed Consent: I have reviewed the patients History and Physical, chart, labs and discussed the procedure including the risks, benefits and alternatives for the proposed anesthesia with the patient or authorized representative who has indicated his/her understanding and acceptance.   Dental advisory given  Plan Discussed with: CRNA, Anesthesiologist and Surgeon  Anesthesia Plan Comments:        Anesthesia Quick Evaluation

## 2018-03-01 ENCOUNTER — Encounter (HOSPITAL_COMMUNITY)
Admission: RE | Disposition: A | Discharge status: Home Health | Payer: Self-pay | Source: Ambulatory Visit | Attending: Surgery

## 2018-03-01 ENCOUNTER — Inpatient Hospital Stay (HOSPITAL_COMMUNITY)
Admission: RE | Admit: 2018-03-01 | Discharge: 2018-03-06 | DRG: 166 | Disposition: A | Discharge status: Home Health | Payer: Medicare Other | Source: Ambulatory Visit | Attending: Surgery | Admitting: Surgery

## 2018-03-01 ENCOUNTER — Inpatient Hospital Stay (HOSPITAL_COMMUNITY): Payer: Medicare Other | Admitting: Anesthesiology

## 2018-03-01 ENCOUNTER — Encounter (HOSPITAL_COMMUNITY): Payer: Self-pay | Admitting: Certified Registered Nurse Anesthetist

## 2018-03-01 ENCOUNTER — Other Ambulatory Visit: Payer: Self-pay

## 2018-03-01 ENCOUNTER — Inpatient Hospital Stay (HOSPITAL_COMMUNITY): Payer: Medicare Other

## 2018-03-01 DIAGNOSIS — Y838 Other surgical procedures as the cause of abnormal reaction of the patient, or of later complication, without mention of misadventure at the time of the procedure: Secondary | ICD-10-CM | POA: Diagnosis present

## 2018-03-01 DIAGNOSIS — D630 Anemia in neoplastic disease: Secondary | ICD-10-CM | POA: Diagnosis present

## 2018-03-01 DIAGNOSIS — Z87891 Personal history of nicotine dependence: Secondary | ICD-10-CM | POA: Diagnosis not present

## 2018-03-01 DIAGNOSIS — J449 Chronic obstructive pulmonary disease, unspecified: Secondary | ICD-10-CM | POA: Diagnosis not present

## 2018-03-01 DIAGNOSIS — Z9221 Personal history of antineoplastic chemotherapy: Secondary | ICD-10-CM

## 2018-03-01 DIAGNOSIS — J90 Pleural effusion, not elsewhere classified: Secondary | ICD-10-CM | POA: Diagnosis not present

## 2018-03-01 DIAGNOSIS — C3411 Malignant neoplasm of upper lobe, right bronchus or lung: Secondary | ICD-10-CM | POA: Diagnosis not present

## 2018-03-01 DIAGNOSIS — J9621 Acute and chronic respiratory failure with hypoxia: Secondary | ICD-10-CM | POA: Diagnosis present

## 2018-03-01 DIAGNOSIS — Z9981 Dependence on supplemental oxygen: Secondary | ICD-10-CM | POA: Diagnosis not present

## 2018-03-01 DIAGNOSIS — I493 Ventricular premature depolarization: Secondary | ICD-10-CM | POA: Diagnosis not present

## 2018-03-01 DIAGNOSIS — J9811 Atelectasis: Secondary | ICD-10-CM | POA: Diagnosis not present

## 2018-03-01 DIAGNOSIS — J9620 Acute and chronic respiratory failure, unspecified whether with hypoxia or hypercapnia: Secondary | ICD-10-CM | POA: Diagnosis not present

## 2018-03-01 DIAGNOSIS — R0602 Shortness of breath: Secondary | ICD-10-CM | POA: Diagnosis not present

## 2018-03-01 DIAGNOSIS — Z85038 Personal history of other malignant neoplasm of large intestine: Secondary | ICD-10-CM | POA: Diagnosis not present

## 2018-03-01 DIAGNOSIS — J918 Pleural effusion in other conditions classified elsewhere: Secondary | ICD-10-CM | POA: Diagnosis present

## 2018-03-01 DIAGNOSIS — Z9889 Other specified postprocedural states: Secondary | ICD-10-CM

## 2018-03-01 DIAGNOSIS — J9589 Other postprocedural complications and disorders of respiratory system, not elsewhere classified: Secondary | ICD-10-CM | POA: Diagnosis not present

## 2018-03-01 DIAGNOSIS — C3412 Malignant neoplasm of upper lobe, left bronchus or lung: Secondary | ICD-10-CM | POA: Diagnosis not present

## 2018-03-01 HISTORY — PX: PLEURAL EFFUSION DRAINAGE: SHX5099

## 2018-03-01 HISTORY — DX: Pleural effusion, not elsewhere classified: J90

## 2018-03-01 HISTORY — PX: VIDEO ASSISTED THORACOSCOPY: SHX5073

## 2018-03-01 LAB — COMPREHENSIVE METABOLIC PANEL
ALT: 10 U/L — AB (ref 14–54)
AST: 16 U/L (ref 15–41)
Albumin: 2.9 g/dL — ABNORMAL LOW (ref 3.5–5.0)
Alkaline Phosphatase: 74 U/L (ref 38–126)
Anion gap: 13 (ref 5–15)
CHLORIDE: 94 mmol/L — AB (ref 101–111)
CO2: 30 mmol/L (ref 22–32)
CREATININE: 0.51 mg/dL (ref 0.44–1.00)
Calcium: 8.8 mg/dL — ABNORMAL LOW (ref 8.9–10.3)
GFR calc Af Amer: >60 mL/min (ref >60)
Glucose, Bld: 124 mg/dL — ABNORMAL HIGH (ref 65–99)
Potassium: 3.8 mmol/L (ref 3.5–5.1)
Sodium: 137 mmol/L (ref 135–145)
Total Bilirubin: 0.5 mg/dL (ref 0.3–1.2)
Total Protein: 6.8 g/dL (ref 6.5–8.1)

## 2018-03-01 LAB — GLUCOSE, CAPILLARY
GLUCOSE-CAPILLARY: 147 mg/dL — AB (ref 65–99)
Glucose-Capillary: 126 mg/dL — ABNORMAL HIGH (ref 65–99)
Glucose-Capillary: 140 mg/dL — ABNORMAL HIGH (ref 65–99)
Glucose-Capillary: 153 mg/dL — ABNORMAL HIGH (ref 65–99)

## 2018-03-01 LAB — BLOOD GAS, ARTERIAL
ACID-BASE EXCESS: 8.4 mmol/L — AB (ref 0.0–2.0)
Bicarbonate: 33.1 mmol/L — ABNORMAL HIGH (ref 20.0–28.0)
DRAWN BY: 470591
O2 Content: 3 L/min
O2 Saturation: 99 %
PH ART: 7.422 (ref 7.350–7.450)
Patient temperature: 98.6
pCO2 arterial: 51.7 mmHg — ABNORMAL HIGH (ref 32.0–48.0)
pO2, Arterial: 131 mmHg — ABNORMAL HIGH (ref 83.0–108.0)

## 2018-03-01 LAB — PREPARE RBC (CROSSMATCH)

## 2018-03-01 LAB — SURGICAL PCR SCREEN
MRSA, PCR: NEGATIVE
STAPHYLOCOCCUS AUREUS: NEGATIVE

## 2018-03-01 LAB — APTT: APTT: 36 s (ref 24–36)

## 2018-03-01 LAB — CBC
HCT: 32.2 % — ABNORMAL LOW (ref 36.0–46.0)
HEMOGLOBIN: 10.1 g/dL — AB (ref 12.0–15.0)
MCH: 30.3 pg (ref 26.0–34.0)
MCHC: 31.4 g/dL (ref 30.0–36.0)
MCV: 96.7 fL (ref 78.0–100.0)
PLATELETS: 366 10*3/uL (ref 150–400)
RBC: 3.33 MIL/uL — AB (ref 3.87–5.11)
RDW: 14.6 % (ref 11.5–15.5)
WBC: 7.6 10*3/uL (ref 4.0–10.5)

## 2018-03-01 LAB — PROTIME-INR
INR: 1.11
PROTHROMBIN TIME: 14.2 s (ref 11.4–15.2)

## 2018-03-01 SURGERY — VIDEO ASSISTED THORACOSCOPY
Anesthesia: General | Laterality: Right

## 2018-03-01 MED ORDER — ACETAMINOPHEN 160 MG/5ML PO SOLN
1000.0000 mg | Freq: Four times a day (QID) | ORAL | Status: DC
  Administered 2018-03-01: 1000 mg via ORAL
  Filled 2018-03-01: qty 40.6

## 2018-03-01 MED ORDER — DIPHENHYDRAMINE HCL 50 MG/ML IJ SOLN: 12.5000 mg | Freq: Four times a day (QID) | INTRAMUSCULAR | Status: DC | PRN

## 2018-03-01 MED ORDER — LIDOCAINE HCL (CARDIAC) 20 MG/ML IV SOLN
INTRAVENOUS | Status: AC
  Filled 2018-03-01: qty 5

## 2018-03-01 MED ORDER — ONDANSETRON HCL 4 MG/2ML IJ SOLN: 4.0000 mg | Freq: Four times a day (QID) | INTRAMUSCULAR | Status: DC | PRN

## 2018-03-01 MED ORDER — HEMOSTATIC AGENTS (NO CHARGE) OPTIME
TOPICAL | Status: DC | PRN
  Administered 2018-03-01: 1 via TOPICAL

## 2018-03-01 MED ORDER — FENTANYL CITRATE (PF) 250 MCG/5ML IJ SOLN
INTRAMUSCULAR | Status: DC | PRN
  Administered 2018-03-01: 25 ug via INTRAVENOUS
  Administered 2018-03-01: 200 ug via INTRAVENOUS
  Administered 2018-03-01: 50 ug via INTRAVENOUS
  Administered 2018-03-01 (×4): 25 ug via INTRAVENOUS
  Administered 2018-03-01: 50 ug via INTRAVENOUS

## 2018-03-01 MED ORDER — SODIUM CHLORIDE 0.9 % IV SOLN: 10.0000 mL/h | Freq: Once | INTRAVENOUS | Status: DC

## 2018-03-01 MED ORDER — ROCURONIUM BROMIDE 10 MG/ML (PF) SYRINGE
PREFILLED_SYRINGE | INTRAVENOUS | Status: DC | PRN
  Administered 2018-03-01: 50 mg via INTRAVENOUS
  Administered 2018-03-01: 10 mg via INTRAVENOUS

## 2018-03-01 MED ORDER — ALBUTEROL SULFATE (2.5 MG/3ML) 0.083% IN NEBU: 2.5000 mg | INHALATION_SOLUTION | Freq: Four times a day (QID) | RESPIRATORY_TRACT | Status: DC | PRN

## 2018-03-01 MED ORDER — PROPOFOL 10 MG/ML IV BOLUS
INTRAVENOUS | Status: DC | PRN
  Administered 2018-03-01: 80 mg via INTRAVENOUS

## 2018-03-01 MED ORDER — SODIUM CHLORIDE 0.9% FLUSH
10.0000 mL | Freq: Two times a day (BID) | INTRAVENOUS | Status: DC
  Administered 2018-03-03 – 2018-03-04 (×2): 10 mL
  Administered 2018-03-04: 20 mL
  Administered 2018-03-05: 10 mL

## 2018-03-01 MED ORDER — ONDANSETRON HCL 4 MG/2ML IJ SOLN
INTRAMUSCULAR | Status: AC
  Filled 2018-03-01: qty 2

## 2018-03-01 MED ORDER — PROMETHAZINE HCL 25 MG/ML IJ SOLN: 6.2500 mg | INTRAMUSCULAR | Status: DC | PRN

## 2018-03-01 MED ORDER — LACTATED RINGERS IV SOLN
INTRAVENOUS | Status: DC | PRN
  Administered 2018-03-01: 07:00:00 via INTRAVENOUS

## 2018-03-01 MED ORDER — DIPHENHYDRAMINE HCL 12.5 MG/5ML PO ELIX: 12.5000 mg | ORAL_SOLUTION | Freq: Four times a day (QID) | ORAL | Status: DC | PRN

## 2018-03-01 MED ORDER — ENOXAPARIN SODIUM 40 MG/0.4ML ~~LOC~~ SOLN
40.0000 mg | Freq: Every day | SUBCUTANEOUS | Status: DC
  Administered 2018-03-02 – 2018-03-05 (×4): 40 mg via SUBCUTANEOUS
  Filled 2018-03-01 (×4): qty 0.4

## 2018-03-01 MED ORDER — ONDANSETRON HCL 4 MG/2ML IJ SOLN
INTRAMUSCULAR | Status: DC | PRN
  Administered 2018-03-01: 4 mg via INTRAVENOUS

## 2018-03-01 MED ORDER — BUDESONIDE 0.25 MG/2ML IN SUSP
0.2500 mg | Freq: Two times a day (BID) | RESPIRATORY_TRACT | Status: DC
  Administered 2018-03-01 – 2018-03-06 (×10): 0.25 mg via RESPIRATORY_TRACT
  Filled 2018-03-01 (×10): qty 2

## 2018-03-01 MED ORDER — DEXAMETHASONE SODIUM PHOSPHATE 10 MG/ML IJ SOLN
INTRAMUSCULAR | Status: AC
  Filled 2018-03-01: qty 1

## 2018-03-01 MED ORDER — MEPERIDINE HCL 50 MG/ML IJ SOLN: 6.2500 mg | INTRAMUSCULAR | Status: DC | PRN

## 2018-03-01 MED ORDER — DEXTROSE 5 % IV SOLN
INTRAVENOUS | Status: DC | PRN
  Administered 2018-03-01: 50 ug/min via INTRAVENOUS

## 2018-03-01 MED ORDER — KETOROLAC TROMETHAMINE 15 MG/ML IJ SOLN
15.0000 mg | Freq: Four times a day (QID) | INTRAMUSCULAR | Status: AC | PRN
  Administered 2018-03-01: 15 mg via INTRAVENOUS
  Filled 2018-03-01: qty 1

## 2018-03-01 MED ORDER — FENTANYL CITRATE (PF) 250 MCG/5ML IJ SOLN
INTRAMUSCULAR | Status: AC
  Filled 2018-03-01: qty 5

## 2018-03-01 MED ORDER — MIDAZOLAM HCL 5 MG/5ML IJ SOLN
INTRAMUSCULAR | Status: DC | PRN
  Administered 2018-03-01 (×2): .5 mg via INTRAVENOUS

## 2018-03-01 MED ORDER — 0.9 % SODIUM CHLORIDE (POUR BTL) OPTIME
TOPICAL | Status: DC | PRN
  Administered 2018-03-01: 2000 mL

## 2018-03-01 MED ORDER — ALBUTEROL SULFATE (2.5 MG/3ML) 0.083% IN NEBU
2.5000 mg | INHALATION_SOLUTION | Freq: Four times a day (QID) | RESPIRATORY_TRACT | Status: DC
  Administered 2018-03-01: 2.5 mg via RESPIRATORY_TRACT
  Filled 2018-03-01: qty 3

## 2018-03-01 MED ORDER — ONDANSETRON HCL 4 MG/2ML IJ SOLN
4.0000 mg | Freq: Four times a day (QID) | INTRAMUSCULAR | Status: DC | PRN
  Administered 2018-03-03 – 2018-03-04 (×3): 4 mg via INTRAVENOUS
  Filled 2018-03-01 (×3): qty 2

## 2018-03-01 MED ORDER — SUGAMMADEX SODIUM 200 MG/2ML IV SOLN
INTRAVENOUS | Status: AC
  Filled 2018-03-01: qty 2

## 2018-03-01 MED ORDER — METOCLOPRAMIDE HCL 5 MG/ML IJ SOLN
10.0000 mg | Freq: Four times a day (QID) | INTRAMUSCULAR | Status: AC
  Administered 2018-03-01 – 2018-03-02 (×4): 10 mg via INTRAVENOUS
  Filled 2018-03-01 (×5): qty 2

## 2018-03-01 MED ORDER — MIDAZOLAM HCL 2 MG/2ML IJ SOLN: 0.5000 mg | Freq: Once | INTRAMUSCULAR | Status: DC | PRN

## 2018-03-01 MED ORDER — ACETAMINOPHEN 500 MG PO TABS
1000.0000 mg | ORAL_TABLET | Freq: Four times a day (QID) | ORAL | Status: DC
  Administered 2018-03-01 – 2018-03-05 (×12): 1000 mg via ORAL
  Filled 2018-03-01 (×13): qty 2

## 2018-03-01 MED ORDER — DEXAMETHASONE SODIUM PHOSPHATE 10 MG/ML IJ SOLN
INTRAMUSCULAR | Status: DC | PRN
  Administered 2018-03-01: 10 mg via INTRAVENOUS

## 2018-03-01 MED ORDER — MIDAZOLAM HCL 2 MG/2ML IJ SOLN
INTRAMUSCULAR | Status: AC
  Filled 2018-03-01: qty 2

## 2018-03-01 MED ORDER — FENTANYL CITRATE (PF) 100 MCG/2ML IJ SOLN
25.0000 ug | INTRAMUSCULAR | Status: DC | PRN
  Administered 2018-03-01 (×2): 25 ug via INTRAVENOUS
  Administered 2018-03-01: 50 ug via INTRAVENOUS

## 2018-03-01 MED ORDER — FENTANYL CITRATE (PF) 100 MCG/2ML IJ SOLN
INTRAMUSCULAR | Status: AC
  Filled 2018-03-01: qty 2

## 2018-03-01 MED ORDER — ROCURONIUM BROMIDE 10 MG/ML (PF) SYRINGE
PREFILLED_SYRINGE | INTRAVENOUS | Status: AC
  Filled 2018-03-01: qty 5

## 2018-03-01 MED ORDER — INSULIN ASPART 100 UNIT/ML ~~LOC~~ SOLN
0.0000 [IU] | SUBCUTANEOUS | Status: DC
  Administered 2018-03-01 – 2018-03-02 (×5): 2 [IU] via SUBCUTANEOUS

## 2018-03-01 MED ORDER — CHLORHEXIDINE GLUCONATE CLOTH 2 % EX PADS
6.0000 | MEDICATED_PAD | Freq: Every day | CUTANEOUS | Status: DC
  Administered 2018-03-01 – 2018-03-05 (×5): 6 via TOPICAL

## 2018-03-01 MED ORDER — MUPIROCIN 2 % EX OINT
1.0000 "application " | TOPICAL_OINTMENT | Freq: Once | CUTANEOUS | Status: AC
  Administered 2018-03-01: 1 via TOPICAL

## 2018-03-01 MED ORDER — NALOXONE HCL 0.4 MG/ML IJ SOLN: 0.4000 mg | INTRAMUSCULAR | Status: DC | PRN

## 2018-03-01 MED ORDER — CEFAZOLIN SODIUM-DEXTROSE 2-4 GM/100ML-% IV SOLN
2.0000 g | INTRAVENOUS | Status: AC
  Administered 2018-03-01: 2 g via INTRAVENOUS
  Filled 2018-03-01: qty 100

## 2018-03-01 MED ORDER — SUGAMMADEX SODIUM 200 MG/2ML IV SOLN
INTRAVENOUS | Status: DC | PRN
  Administered 2018-03-01: 150 mg via INTRAVENOUS

## 2018-03-01 MED ORDER — PROPOFOL 10 MG/ML IV BOLUS
INTRAVENOUS | Status: AC
  Filled 2018-03-01: qty 20

## 2018-03-01 MED ORDER — MUPIROCIN 2 % EX OINT
TOPICAL_OINTMENT | CUTANEOUS | Status: AC
  Filled 2018-03-01: qty 22

## 2018-03-01 MED ORDER — BISACODYL 5 MG PO TBEC
10.0000 mg | DELAYED_RELEASE_TABLET | Freq: Every day | ORAL | Status: DC
  Administered 2018-03-01 – 2018-03-04 (×4): 10 mg via ORAL
  Administered 2018-03-05: 5 mg via ORAL
  Filled 2018-03-01 (×9): qty 2

## 2018-03-01 MED ORDER — OXYCODONE HCL 5 MG PO TABS
5.0000 mg | ORAL_TABLET | ORAL | Status: DC | PRN
  Administered 2018-03-03: 10 mg via ORAL
  Administered 2018-03-03: 5 mg via ORAL
  Administered 2018-03-05: 10 mg via ORAL
  Filled 2018-03-01: qty 1
  Filled 2018-03-01 (×2): qty 2

## 2018-03-01 MED ORDER — SENNOSIDES-DOCUSATE SODIUM 8.6-50 MG PO TABS
1.0000 | ORAL_TABLET | Freq: Every day | ORAL | Status: DC
  Filled 2018-03-01 (×2): qty 1

## 2018-03-01 MED ORDER — KCL IN DEXTROSE-NACL 20-5-0.45 MEQ/L-%-% IV SOLN
INTRAVENOUS | Status: DC
  Administered 2018-03-01 – 2018-03-03 (×2): via INTRAVENOUS
  Filled 2018-03-01 (×3): qty 1000

## 2018-03-01 MED ORDER — SODIUM CHLORIDE 0.9% FLUSH: 9.0000 mL | INTRAVENOUS | Status: DC | PRN

## 2018-03-01 MED ORDER — CEFAZOLIN SODIUM-DEXTROSE 2-4 GM/100ML-% IV SOLN
2.0000 g | Freq: Three times a day (TID) | INTRAVENOUS | Status: AC
  Administered 2018-03-01 – 2018-03-02 (×2): 2 g via INTRAVENOUS
  Filled 2018-03-01 (×2): qty 100

## 2018-03-01 MED ORDER — SODIUM CHLORIDE 0.9% FLUSH: 10.0000 mL | INTRAVENOUS | Status: DC | PRN

## 2018-03-01 MED ORDER — MORPHINE SULFATE 2 MG/ML IV SOLN
INTRAVENOUS | Status: DC
  Administered 2018-03-01: 12:00:00 via INTRAVENOUS
  Administered 2018-03-01: 9 mg via INTRAVENOUS
  Administered 2018-03-02: 1.5 mg via INTRAVENOUS
  Administered 2018-03-02: 6 mg via INTRAVENOUS
  Administered 2018-03-02 (×3): 3 mg via INTRAVENOUS
  Administered 2018-03-02: 1.5 mg via INTRAVENOUS
  Administered 2018-03-03: 3 mg via INTRAVENOUS
  Administered 2018-03-03: 0 mg via INTRAVENOUS
  Administered 2018-03-03 (×3): 3 mg via INTRAVENOUS
  Administered 2018-03-04 (×2): 1.5 mg via INTRAVENOUS
  Filled 2018-03-01 (×2): qty 30

## 2018-03-01 MED ORDER — POTASSIUM CHLORIDE 10 MEQ/50ML IV SOLN: 10.0000 meq | Freq: Every day | INTRAVENOUS | Status: DC | PRN

## 2018-03-01 SURGICAL SUPPLY — 63 items
ADH SKN CLS APL DERMABOND .7 (GAUZE/BANDAGES/DRESSINGS) ×1
CANISTER SUCT 3000ML PPV (MISCELLANEOUS) ×6 IMPLANT
CATH ROBINSON RED A/P 18FR (CATHETERS) IMPLANT
CATH THORACIC 28FR (CATHETERS) IMPLANT
CATH THORACIC 36FR (CATHETERS) IMPLANT
CATH THORACIC 36FR RT ANG (CATHETERS) IMPLANT
CLIP VESOCCLUDE MED 6/CT (CLIP) ×3 IMPLANT
CONN ST 1/4X3/8  BEN (MISCELLANEOUS) ×4
CONN ST 1/4X3/8 BEN (MISCELLANEOUS) IMPLANT
CONT SPEC 4OZ CLIKSEAL STRL BL (MISCELLANEOUS) ×6 IMPLANT
COVER SURGICAL LIGHT HANDLE (MISCELLANEOUS) ×5 IMPLANT
DERMABOND ADVANCED (GAUZE/BANDAGES/DRESSINGS) ×2
DERMABOND ADVANCED .7 DNX12 (GAUZE/BANDAGES/DRESSINGS) IMPLANT
DRAIN CHANNEL 28F RND 3/8 FF (WOUND CARE) IMPLANT
DRAIN CHANNEL 32F RND 10.7 FF (WOUND CARE) ×4 IMPLANT
DRAPE LAPAROSCOPIC ABDOMINAL (DRAPES) ×3 IMPLANT
ELECT BLADE 4.0 EZ CLEAN MEGAD (MISCELLANEOUS) ×3
ELECT REM PT RETURN 9FT ADLT (ELECTROSURGICAL) ×3
ELECTRODE BLDE 4.0 EZ CLN MEGD (MISCELLANEOUS) ×1 IMPLANT
ELECTRODE REM PT RTRN 9FT ADLT (ELECTROSURGICAL) ×1 IMPLANT
GAUZE SPONGE 4X4 12PLY STRL (GAUZE/BANDAGES/DRESSINGS) ×3 IMPLANT
GAUZE SPONGE 4X4 12PLY STRL LF (GAUZE/BANDAGES/DRESSINGS) ×2 IMPLANT
GLOVE EUDERMIC 7 POWDERFREE (GLOVE) ×6 IMPLANT
GOWN STRL REUS W/ TWL LRG LVL3 (GOWN DISPOSABLE) ×2 IMPLANT
GOWN STRL REUS W/ TWL XL LVL3 (GOWN DISPOSABLE) ×1 IMPLANT
GOWN STRL REUS W/TWL LRG LVL3 (GOWN DISPOSABLE) ×6
GOWN STRL REUS W/TWL XL LVL3 (GOWN DISPOSABLE) ×3
KIT BASIN OR (CUSTOM PROCEDURE TRAY) ×3 IMPLANT
KIT TURNOVER KIT B (KITS) ×3 IMPLANT
NS IRRIG 1000ML POUR BTL (IV SOLUTION) ×6 IMPLANT
PACK CHEST (CUSTOM PROCEDURE TRAY) ×3 IMPLANT
PAD ARMBOARD 7.5X6 YLW CONV (MISCELLANEOUS) ×6 IMPLANT
PASSER SUT SWANSON 36MM LOOP (INSTRUMENTS) ×2 IMPLANT
SEALANT SURG COSEAL 4ML (VASCULAR PRODUCTS) IMPLANT
SEALANT SURG COSEAL 8ML (VASCULAR PRODUCTS) ×2 IMPLANT
SOLUTION ANTI FOG 6CC (MISCELLANEOUS) ×3 IMPLANT
SUT PROLENE 3 0 SH DA (SUTURE) IMPLANT
SUT PROLENE 4 0 RB 1 (SUTURE)
SUT PROLENE 4-0 RB1 .5 CRCL 36 (SUTURE) IMPLANT
SUT SILK  1 MH (SUTURE) ×4
SUT SILK 1 MH (SUTURE) ×1 IMPLANT
SUT SILK 2 0SH CR/8 30 (SUTURE) IMPLANT
SUT VIC AB 1 CTX 36 (SUTURE) ×3
SUT VIC AB 1 CTX36XBRD ANBCTR (SUTURE) IMPLANT
SUT VIC AB 2 TP1 27 (SUTURE) ×2 IMPLANT
SUT VIC AB 2-0 CT1 27 (SUTURE) ×3
SUT VIC AB 2-0 CT1 TAPERPNT 27 (SUTURE) ×1 IMPLANT
SUT VIC AB 2-0 CTX 36 (SUTURE) ×2 IMPLANT
SUT VIC AB 2-0 UR6 27 (SUTURE) IMPLANT
SUT VIC AB 3-0 MH 27 (SUTURE) IMPLANT
SUT VIC AB 3-0 SH 27 (SUTURE)
SUT VIC AB 3-0 SH 27X BRD (SUTURE) IMPLANT
SUT VIC AB 3-0 X1 27 (SUTURE) ×3 IMPLANT
SYR BULB IRRIGATION 50ML (SYRINGE) IMPLANT
SYSTEM SAHARA CHEST DRAIN ATS (WOUND CARE) ×3 IMPLANT
TAPE CLOTH SURG 4X10 WHT LF (GAUZE/BANDAGES/DRESSINGS) ×2 IMPLANT
TIP APPLICATOR SPRAY EXTEND 16 (VASCULAR PRODUCTS) IMPLANT
TOWEL GREEN STERILE (TOWEL DISPOSABLE) ×3 IMPLANT
TOWEL GREEN STERILE FF (TOWEL DISPOSABLE) ×3 IMPLANT
TRAP SPECIMEN MUCOUS 40CC (MISCELLANEOUS) IMPLANT
TRAY FOLEY W/METER SILVER 14FR (SET/KITS/TRAYS/PACK) ×3 IMPLANT
TUNNELER SHEATH ON-Q 11GX8 DSP (PAIN MANAGEMENT) ×3 IMPLANT
WATER STERILE IRR 1000ML POUR (IV SOLUTION) ×6 IMPLANT

## 2018-03-01 NOTE — Brief Op Note (Signed)
03/01/2018  9:25 AM  PATIENT:  Melissa Golden  72 y.o. female  PRE-OPERATIVE DIAGNOSIS:  RIGHT PLEURAL EFFUSION  POST-OPERATIVE DIAGNOSIS:  RIGHT PLEURAL EFFUSION  PROCEDURE:  Procedure(s): VIDEO ASSISTED THORACOSCOPY (Right) Right mini-thoracotomy DRAINAGE OF PLEURAL EFFUSION (Right)  SURGEON:  Surgeon(s) and Role:    * Trey Bebee, Fernande Boyden, MD - Primary  PHYSICIAN ASSISTANT: Lars Pinks, PA-C   ANESTHESIA:   general  EBL:  10 mL   BLOOD ADMINISTERED:none  DRAINS: (two 67F) Blake drain(s) in the right pleural space   LOCAL MEDICATIONS USED:  NONE  SPECIMEN:  No Specimen  DISPOSITION OF SPECIMEN:  N/A  COUNTS:  YES  TOURNIQUET:  * No tourniquets in log *  DICTATION: .Note written in EPIC  PLAN OF CARE: Admit to inpatient   PATIENT DISPOSITION:  PACU - hemodynamically stable.   Delay start of Pharmacological VTE agent (>24hrs) due to surgical blood loss or risk of bleeding: yes

## 2018-03-01 NOTE — Anesthesia Procedure Notes (Signed)
Procedure Name: Intubation Date/Time: 03/01/2018 7:58 AM Performed by: Harden Mo, CRNA Pre-anesthesia Checklist: Patient identified, Emergency Drugs available, Suction available and Patient being monitored Patient Re-evaluated:Patient Re-evaluated prior to induction Oxygen Delivery Method: Circle System Utilized Preoxygenation: Pre-oxygenation with 100% oxygen Induction Type: IV induction Ventilation: Mask ventilation without difficulty Laryngoscope Size: Miller and 2 Grade View: Grade I Endobronchial tube: Double lumen EBT, EBT position confirmed by auscultation, EBT position confirmed by fiberoptic bronchoscope and Left and 37 Fr Number of attempts: 1 Airway Equipment and Method: Stylet,  Oral airway and Fiberoptic brochoscope Placement Confirmation: ETT inserted through vocal cords under direct vision,  positive ETCO2 and breath sounds checked- equal and bilateral Tube secured with: Tape Dental Injury: Teeth and Oropharynx as per pre-operative assessment

## 2018-03-01 NOTE — Transfer of Care (Signed)
Immediate Anesthesia Transfer of Care Note  Patient: Melissa Golden  Procedure(s) Performed: VIDEO ASSISTED THORACOSCOPY (Right ) DRAINAGE OF PLEURAL EFFUSION (Right )  Patient Location: PACU  Anesthesia Type:General  Level of Consciousness: awake, alert  and oriented  Airway & Oxygen Therapy: Patient Spontanous Breathing and Patient connected to face mask oxygen  Post-op Assessment: Report given to RN, Post -op Vital signs reviewed and stable and Patient moving all extremities X 4  Post vital signs: Reviewed and stable  Last Vitals:  Vitals Value Taken Time  BP 142/82 03/01/2018 10:14 AM  Temp    Pulse 108 03/01/2018 10:17 AM  Resp 20 03/01/2018 10:17 AM  SpO2 100 % 03/01/2018 10:17 AM  Vitals shown include unvalidated device data.  Last Pain:  Vitals:   03/01/18 0702  PainSc: 0-No pain      Patients Stated Pain Goal: 2 (56/97/94 [id number])  Complications: No apparent anesthesia complications

## 2018-03-01 NOTE — Op Note (Signed)
03/01/2018 Arnaldo Natal Keidel [id number]  Surgeon: Gaye Pollack, MD   First Assistant: Lars Pinks, PA-C  Preoperative Diagnosis: Right pleural effusion  Postoperative Diagnosis: Same  Procedure:  1.  Right video-assisted thoracoscopy 2.  Right mini thoracotomy with drainage of loculated pleural effusion  Anesthesia: General Endotracheal   Clinical History/Surgical Indication:   She has a moderate size right pleural effusion with compressive atelectasis of the right lower lobe which appears somewhat worse than her chest x-ray immediately after the thoracentesis. The CT scan of the chest done on 02/18/2018 showed partial loculation of this pleural effusion and most likely she had some residual blood from her hemothorax followed by a sympathetic effusion in response to that. With her severe COPD I think even a small pleural effusion with compressive atelectasis could contribute to more shortness of breath. I think it is probably best to completely drain this effusion to allow full reexpansion of her lung. I am concerned that if we leave it alone she is going to have a progressive downhill course and end up with pneumonia or empyema. She is scheduled to begin radiation therapy next week but I think that would probably have to be postponed a little to allow this problem to be resolved. I think that a right VATSshould allow adequate exposure to remove this effusion. If there is significant loculation that could require a small thoracotomy. I discussed the operative procedure with the patient and her husband including alternatives, benefits, and risks including but not limited to bleeding, blood transfusion, infection, injury to the lung, prolonged air leak, respiratory failure, and recurrence of the effusion. They understand and would like to proceed with surgery.    Preparation:  The patient was seen in the preoperative holding area and the correct patient, correct operation,  correct operative sidewere confirmed with the patient after reviewing the medical record and CT scan. The consent was signed by me. Preoperative antibiotics were given. The right side of the chest was signed by me. The patient was taken back to the operating room and positioned supine on the operating room table. After being placed under general endotracheal anesthesia by the anesthesia team using a double lumen tube a foley catheter was placed. The patient was turned into the left lateral decubitus position. The chest was prepped with betadine soap and solution. A surgical time-out was taken and the correct patient,operative side, and operative procedure were confirmed with the nursing and anesthesia staff.   Operative Procedure:  A 1 cm incision was made in the posterior-axillary line at about the 8th intercostal space and an 8 mm trocar was inserted into the pleural space. It was immediately apparent that the pleural space was obliterated by the multi-loculated effusion and that this could not be done effectively by the VATS approach. Therefore a short lateral thoracotomy incision was made and the chest entered through the 9th ICS. The pleural space posteriorly and inferiorly was filled with a brown, serous multiloculated fluid.The loculated effusion was completely drained. This allowed complete expansion of the lung. The chest was irrigated with warm saline. Hemostasis was complete. Two 47 F Bard drains were placed through separate stab incisions and were positioned posteriorly and anteriorly in the pleural space. The ribs were reapproximated with # 2 vicryl pericostal sutures and the muscles closed with continuous 0 vicryl suture. The subcutaneous tissue was closed with 2-0 vicryl continuous suture. The skin was closed with 3-0 vicryl subcuticular suture. All sponge, needle, and instrument counts were reported correct at the  end of the case. Dry sterile dressings were placed over the incisions and around the  chest tubes which were connected to pleurevac suction. The patient was turned supine, extubated,then transported to the PACU in satisfactory and stable condition.

## 2018-03-01 NOTE — Progress Notes (Signed)
Unable to obtain urine spec

## 2018-03-01 NOTE — Anesthesia Procedure Notes (Signed)
Arterial Line Insertion Start/End4/03/2018 7:10 AM, 03/01/2018 7:17 AM Performed by: Josephine Igo, CRNA, CRNA  Patient location: Pre-op. Preanesthetic checklist: patient identified, IV checked, site marked, risks and benefits discussed, surgical consent, monitors and equipment checked, pre-op evaluation and anesthesia consent Lidocaine 1% used for infiltration Left, radial was placed Catheter size: 20 G Hand hygiene performed  and maximum sterile barriers used   Attempts: 1 Procedure performed without using ultrasound guided technique. Ultrasound Notes:anatomy identified, needle tip was noted to be adjacent to the nerve/plexus identified and no ultrasound evidence of intravascular and/or intraneural injection Following insertion, dressing applied and Biopatch. Post procedure assessment: normal  Patient tolerated the procedure well with no immediate complications.

## 2018-03-01 NOTE — Anesthesia Postprocedure Evaluation (Signed)
Anesthesia Post Note  Patient: Melissa Golden  Procedure(s) Performed: VIDEO ASSISTED THORACOSCOPY (Right ) DRAINAGE OF PLEURAL EFFUSION (Right )     Patient location during evaluation: PACU Anesthesia Type: General Level of consciousness: sedated, oriented and patient cooperative Pain management: pain level controlled Vital Signs Assessment: post-procedure vital signs reviewed and stable Respiratory status: spontaneous breathing, nonlabored ventilation, respiratory function stable and patient connected to nasal cannula oxygen Cardiovascular status: blood pressure returned to baseline and stable Postop Assessment: no apparent nausea or vomiting Anesthetic complications: no    Last Vitals:  Vitals:   03/01/18 1133 03/01/18 1145  BP:  (!) 120/59  Pulse:  81  Resp: (!) 21 17  Temp:    SpO2: 100% 100%    Last Pain:  Vitals:   03/01/18 1145  PainSc: Asleep                 Kenzley Ke,E. Heidie Krall

## 2018-03-01 NOTE — Progress Notes (Addendum)
EVENING ROUNDS NOTE :     [address]       [place] [address]             [phone]                 Day of Surgery Procedure(s) (LRB): VIDEO ASSISTED THORACOSCOPY (Right) DRAINAGE OF PLEURAL EFFUSION (Right)  Patient resting but awakened. Pain under good control.  Total Length of Stay:  LOS: 0 days  BP (!) 89/53   Pulse 70   Temp 97.7 F (36.5 C)   Resp 12   Ht 5\' 2"  (1.575 m)   Wt 125 lb (56.7 kg)   SpO2 97%   BMI 22.86 kg/m   .Intake/Output      04/05 0701 - 04/06 0700   I.V. (mL/kg) 1385 (24.4)   IV Piggyback 100   Total Intake(mL/kg) 1485 (26.2)   Urine (mL/kg/hr) 195 (0.2)   Other 400   Blood 10   Chest Tube 360 -no air leak  Total Output 965   Net +520         .  ceFAZolin (ANCEF) IV Stopped (03/01/18 1803)  . dextrose 5 % and 0.45 % NaCl with KCl 20 mEq/L 50 mL/hr at 03/01/18 1900  . potassium chloride       Lab Results  Component Value Date   WBC 7.6 03/01/2018   HGB 10.1 (L) 03/01/2018   HCT 32.2 (L) 03/01/2018   PLT 366 03/01/2018   GLUCOSE 124 (H) 03/01/2018   ALT 10 (L) 03/01/2018   AST 16 03/01/2018   NA 137 03/01/2018   K 3.8 03/01/2018   CL 94 (L) 03/01/2018   CREATININE 0.51 03/01/2018   BUN <5 (L) 03/01/2018   CO2 30 03/01/2018   INR 1.11 03/01/2018  Continue present management.  Lars Pinks PA-C 03/01/2018 9:32 PM

## 2018-03-01 NOTE — Interval H&P Note (Signed)
History and Physical Interval Note:  03/01/2018 7:16 AM  Melissa Golden  has presented today for surgery, with the diagnosis of RIGHT PLEURAL EFFUSION  The various methods of treatment have been discussed with the patient and family. After consideration of risks, benefits and other options for treatment, the patient has consented to  Procedure(s): VIDEO ASSISTED THORACOSCOPY (Right) DRAINAGE OF PLEURAL EFFUSION (Right) as a surgical intervention .  The patient's history has been reviewed, patient examined, no change in status, stable for surgery.  I have reviewed the patient's chart and labs.  Questions were answered to the patient's satisfaction.     Gaye Pollack

## 2018-03-01 NOTE — Anesthesia Procedure Notes (Signed)
Central Venous Catheter Insertion Performed by: Roberts Gaudy, MD, anesthesiologist Start/End4/03/2018 7:10 AM, 03/01/2018 7:20 AM Patient location: Pre-op. Preanesthetic checklist: patient identified, IV checked, site marked, risks and benefits discussed, surgical consent, monitors and equipment checked, pre-op evaluation, timeout performed and anesthesia consent Lidocaine 1% used for infiltration and patient sedated Hand hygiene performed  and maximum sterile barriers used  Catheter size: 8 Fr Total catheter length 16. Central line was placed.Double lumen Procedure performed using ultrasound guided technique. Ultrasound Notes:image(s) printed for medical record Attempts: 1 Following insertion, dressing applied and line sutured. Post procedure assessment: blood return through all ports  Patient tolerated the procedure well with no immediate complications.

## 2018-03-02 ENCOUNTER — Inpatient Hospital Stay (HOSPITAL_COMMUNITY): Payer: Medicare Other

## 2018-03-02 ENCOUNTER — Encounter (HOSPITAL_COMMUNITY): Payer: Self-pay | Admitting: Surgery

## 2018-03-02 LAB — BLOOD GAS, ARTERIAL
ACID-BASE EXCESS: 7.3 mmol/L — AB (ref 0.0–2.0)
BICARBONATE: 32.3 mmol/L — AB (ref 20.0–28.0)
FIO2: 0.32
O2 SAT: 97.6 %
PATIENT TEMPERATURE: 98.6
pCO2 arterial: 55.4 mmHg — ABNORMAL HIGH (ref 32.0–48.0)
pH, Arterial: 7.384 (ref 7.350–7.450)
pO2, Arterial: 96.3 mmHg (ref 83.0–108.0)

## 2018-03-02 LAB — BASIC METABOLIC PANEL
Anion gap: 8 (ref 5–15)
BUN: 5 mg/dL — ABNORMAL LOW (ref 6–20)
CALCIUM: 8.2 mg/dL — AB (ref 8.9–10.3)
CO2: 31 mmol/L (ref 22–32)
CREATININE: 0.39 mg/dL — AB (ref 0.44–1.00)
Chloride: 99 mmol/L — ABNORMAL LOW (ref 101–111)
GFR calc Af Amer: >60 mL/min (ref >60)
Glucose, Bld: 111 mg/dL — ABNORMAL HIGH (ref 65–99)
POTASSIUM: 3.6 mmol/L (ref 3.5–5.1)
SODIUM: 138 mmol/L (ref 135–145)

## 2018-03-02 LAB — GLUCOSE, CAPILLARY
GLUCOSE-CAPILLARY: 111 mg/dL — AB (ref 65–99)
GLUCOSE-CAPILLARY: 138 mg/dL — AB (ref 65–99)
Glucose-Capillary: 138 mg/dL — ABNORMAL HIGH (ref 65–99)
Glucose-Capillary: 77 mg/dL (ref 65–99)
Glucose-Capillary: 135 mg/dL — ABNORMAL HIGH (ref 65–99)

## 2018-03-02 LAB — CBC
HCT: 25.6 % — ABNORMAL LOW (ref 36.0–46.0)
HEMOGLOBIN: 8.2 g/dL — AB (ref 12.0–15.0)
MCH: 31.2 pg (ref 26.0–34.0)
MCHC: 32 g/dL (ref 30.0–36.0)
MCV: 97.3 fL (ref 78.0–100.0)
Platelets: 297 10*3/uL (ref 150–400)
RBC: 2.63 MIL/uL — AB (ref 3.87–5.11)
RDW: 14.7 % (ref 11.5–15.5)
WBC: 8.8 10*3/uL (ref 4.0–10.5)

## 2018-03-02 MED ORDER — ALBUMIN HUMAN 5 % IV SOLN
INTRAVENOUS | Status: AC
  Administered 2018-03-02: 12.5 g via INTRAVENOUS
  Filled 2018-03-02: qty 250

## 2018-03-02 MED ORDER — ALBUTEROL SULFATE (2.5 MG/3ML) 0.083% IN NEBU
2.5000 mg | INHALATION_SOLUTION | Freq: Four times a day (QID) | RESPIRATORY_TRACT | Status: DC
  Administered 2018-03-02 – 2018-03-04 (×10): 2.5 mg via RESPIRATORY_TRACT
  Filled 2018-03-02 (×10): qty 3

## 2018-03-02 MED ORDER — INSULIN ASPART 100 UNIT/ML ~~LOC~~ SOLN
0.0000 [IU] | Freq: Three times a day (TID) | SUBCUTANEOUS | Status: DC
  Administered 2018-03-02: 3 [IU] via SUBCUTANEOUS

## 2018-03-02 MED ORDER — ALBUMIN HUMAN 5 % IV SOLN
12.5000 g | Freq: Once | INTRAVENOUS | Status: AC
  Administered 2018-03-02: 12.5 g via INTRAVENOUS

## 2018-03-02 NOTE — Progress Notes (Signed)
      [address]       [place] [address]             [phone]      Sitting up in bed visiting with family  BP (!) 127/95   Pulse (!) 121   Temp 98.2 F (36.8 C) (Oral)   Resp 16   Ht 5\' 2"  (1.575 m)   Wt 125 lb (56.7 kg)   SpO2 100%   BMI 22.86 kg/m   Intake/Output Summary (Last 24 hours) at 03/02/2018 1849 Last data filed at 03/02/2018 1700 Gross per 24 hour  Intake 914.17 ml  Output 790 ml  Net 124.17 ml   Continue current care  Melissa Golden C. Roxan Hockey, MD [facility] [phone]

## 2018-03-02 NOTE — Progress Notes (Signed)
1 Day Post-Op Procedure(s) (LRB): VIDEO ASSISTED THORACOSCOPY (Right) DRAINAGE OF PLEURAL EFFUSION (Right) Subjective: Some incisional pain  Objective: Vital signs in last 24 hours: Temp:  [97 F (36.1 C)-98.4 F (36.9 C)] 97.5 F (36.4 C) (04/06 0737) Pulse Rate:  [51-107] 80 (04/06 0726) Cardiac Rhythm: Sinus bradycardia (04/06 0400) Resp:  [10-24] 12 (04/06 0747) BP: (89-142)/(48-82) 103/56 (04/06 0726) SpO2:  [94 %-100 %] 96 % (04/06 0747) Arterial Line BP: (128-153)/(49-56) 133/54 (04/05 1145)  Hemodynamic parameters for last 24 hours:    Intake/Output from previous day: 04/05 0701 - 04/06 0700 In: 2085 [I.V.:1985; IV Piggyback:100] Out: 2620 [Urine:675; Blood:10; Chest Tube:470] Intake/Output this shift: No intake/output data recorded.  General appearance: cooperative and no distress Neurologic: intact Heart: irregularly irregular rhythm Lungs: diminished breath sounds bilaterally Abdomen: normal findings: soft, non-tender no air leak  Lab Results: Recent Labs    03/01/18 0641 03/02/18 0500  WBC 7.6 8.8  HGB 10.1* 8.2*  HCT 32.2* 25.6*  PLT 366 297   BMET:  Recent Labs    03/01/18 0641 03/02/18 0500  NA 137 138  K 3.8 3.6  CL 94* 99*  CO2 30 31  GLUCOSE 124* 111*  BUN <5* 5*  CREATININE 0.51 0.39*  CALCIUM 8.8* 8.2*    PT/INR:  Recent Labs    03/01/18 0641  LABPROT 14.2  INR 1.11   ABG    Component Value Date/Time   PHART 7.384 03/02/2018 0601   HCO3 32.3 (H) 03/02/2018 0601   O2SAT 97.6 03/02/2018 0601   CBG (last 3)  Recent Labs    03/01/18 1921 03/01/18 2332 03/02/18 0325  GLUCAP 147* 153* 138*    Assessment/Plan: S/P Procedure(s) (LRB): VIDEO ASSISTED THORACOSCOPY (Right) DRAINAGE OF PLEURAL EFFUSION (Right) -POD # 1 drainage of pleural effusion Moderate drainage from tubes, no air leak- CT to water seal Hypercarbic respiratory failure- likely chronic as it is well compensated Advance diet Minimize IVF Irregular  rhythm- check ECG SCD + enoxaparin for DVT prophylaxis PCA for pain control   LOS: 1 day    [place] Nakayama 03/02/2018

## 2018-03-03 ENCOUNTER — Inpatient Hospital Stay (HOSPITAL_COMMUNITY): Payer: Medicare Other

## 2018-03-03 ENCOUNTER — Other Ambulatory Visit: Payer: Self-pay | Admitting: Oncology

## 2018-03-03 LAB — GLUCOSE, CAPILLARY
GLUCOSE-CAPILLARY: 85 mg/dL (ref 65–99)
GLUCOSE-CAPILLARY: 87 mg/dL (ref 65–99)
Glucose-Capillary: 86 mg/dL (ref 65–99)
Glucose-Capillary: 104 mg/dL — ABNORMAL HIGH (ref 65–99)

## 2018-03-03 NOTE — Progress Notes (Signed)
      [address]       [place] [address]             [phone]      Up in chair, ambulated earlier  Stable day  Awaiting bed on stepdown  Lleyton Byers C. Roxan Hockey, MD [facility] [phone]

## 2018-03-03 NOTE — Progress Notes (Signed)
2 Days Post-Op Procedure(s) (LRB): VIDEO ASSISTED THORACOSCOPY (Right) DRAINAGE OF PLEURAL EFFUSION (Right) Subjective: No complaints this AM  Objective: Vital signs in last 24 hours: Temp:  [97.7 F (36.5 C)-98.3 F (36.8 C)] 97.9 F (36.6 C) (04/07 0748) Pulse Rate:  [65-121] 101 (04/07 0900) Cardiac Rhythm: Normal sinus rhythm (04/07 0800) Resp:  [11-17] 13 (04/07 0900) BP: (85-148)/(44-95) 148/76 (04/07 0900) SpO2:  [96 %-100 %] 98 % (04/07 0905) Arterial Line BP: (98-156)/(48-113) 156/63 (04/06 1700) Weight:  [127 lb 11.2 oz (57.9 kg)] 127 lb 11.2 oz (57.9 kg) (04/07 0600)  Hemodynamic parameters for last 24 hours:    Intake/Output from previous day: 04/06 0701 - 04/07 0700 In: 406.7 [I.V.:406.7] Out: 20 [Chest Tube:20] Intake/Output this shift: Total I/O In: 10 [I.V.:10] Out: -   General appearance: alert, cooperative and no distress Neurologic: intact Heart: regular rate and rhythm Lungs: diminished breath sounds bilaterally and no wheezing no air leak  Lab Results: Recent Labs    03/01/18 0641 03/02/18 0500  WBC 7.6 8.8  HGB 10.1* 8.2*  HCT 32.2* 25.6*  PLT 366 297   BMET:  Recent Labs    03/01/18 0641 03/02/18 0500  NA 137 138  K 3.8 3.6  CL 94* 99*  CO2 30 31  GLUCOSE 124* 111*  BUN <5* 5*  CREATININE 0.51 0.39*  CALCIUM 8.8* 8.2*    PT/INR:  Recent Labs    03/01/18 0641  LABPROT 14.2  INR 1.11   ABG    Component Value Date/Time   PHART 7.384 03/02/2018 0601   HCO3 32.3 (H) 03/02/2018 0601   O2SAT 97.6 03/02/2018 0601   CBG (last 3)  Recent Labs    03/02/18 1529 03/02/18 2118 03/03/18 0751  GLUCAP 135* 138* 85    Assessment/Plan: S/P Procedure(s) (LRB): VIDEO ASSISTED THORACOSCOPY (Right) DRAINAGE OF PLEURAL EFFUSION (Right) Plan for transfer to step-down: see transfer orders  Doing well Minimal output from CT Will dc anterior tube today Keep posterior tube to water seal Ambulate Pain well controlled SCD +  enoxaparin for DVT prophylaxis    LOS: 2 days    [place] Nakayama 03/03/2018

## 2018-03-04 ENCOUNTER — Inpatient Hospital Stay (HOSPITAL_COMMUNITY): Payer: Medicare Other

## 2018-03-04 ENCOUNTER — Ambulatory Visit: Payer: Medicare Other | Admitting: Radiation Oncology

## 2018-03-04 LAB — GLUCOSE, CAPILLARY
GLUCOSE-CAPILLARY: 102 mg/dL — AB (ref 65–99)
Glucose-Capillary: 82 mg/dL (ref 65–99)
Glucose-Capillary: 106 mg/dL — ABNORMAL HIGH (ref 65–99)

## 2018-03-04 LAB — BASIC METABOLIC PANEL
Anion gap: 11 (ref 5–15)
BUN: <5 mg/dL — ABNORMAL LOW (ref 6–20)
CHLORIDE: 96 mmol/L — AB (ref 101–111)
CO2: 31 mmol/L (ref 22–32)
CREATININE: 0.39 mg/dL — AB (ref 0.44–1.00)
Calcium: 8.2 mg/dL — ABNORMAL LOW (ref 8.9–10.3)
GFR calc Af Amer: >60 mL/min (ref >60)
GFR calc non Af Amer: >60 mL/min (ref >60)
Glucose, Bld: 83 mg/dL (ref 65–99)
POTASSIUM: 3.6 mmol/L (ref 3.5–5.1)
Sodium: 138 mmol/L (ref 135–145)

## 2018-03-04 LAB — CBC
HEMATOCRIT: 28.2 % — AB (ref 36.0–46.0)
HEMOGLOBIN: 8.8 g/dL — AB (ref 12.0–15.0)
MCH: 30.3 pg (ref 26.0–34.0)
MCHC: 31.2 g/dL (ref 30.0–36.0)
MCV: 97.2 fL (ref 78.0–100.0)
Platelets: 350 10*3/uL (ref 150–400)
RBC: 2.9 MIL/uL — AB (ref 3.87–5.11)
RDW: 15.2 % (ref 11.5–15.5)
WBC: 5.6 10*3/uL (ref 4.0–10.5)

## 2018-03-04 MED ORDER — ALBUTEROL SULFATE (2.5 MG/3ML) 0.083% IN NEBU
2.5000 mg | INHALATION_SOLUTION | Freq: Four times a day (QID) | RESPIRATORY_TRACT | Status: DC
  Administered 2018-03-04 – 2018-03-06 (×7): 2.5 mg via RESPIRATORY_TRACT
  Filled 2018-03-04 (×7): qty 3

## 2018-03-04 NOTE — Care Management Note (Signed)
Case Management Note Marvetta Gibbons RN, BSN Unit 4E-Case Manager- [place] coverage [phone]  Patient Details  Name: Melissa Golden MRN: [id number] Date of Birth: Oct 05, 1946  Subjective/Objective:   Pt admitted s/p VIDEO ASSISTED THORACOSCOPY (Right) DRAINAGE OF PLEURAL EFFUSION                  Action/Plan: PTA pt lived at home with spouse in a mobile home, has baseline home 02 at 3L with [facility]. Also has RW and cane at home however does not use them much in the home due to space. Anticipate return home with spouse- CM to follow for transition of care needs.   Expected Discharge Date:                  Expected Discharge Plan:  Home/Self Care  In-House Referral:     Discharge planning Services  CM Consult  Post Acute Care Choice:    Choice offered to:     DME Arranged:    DME Agency:     HH Arranged:    HH Agency:     Status of Service:  In process, will continue to follow  If discussed at Long Length of Stay Meetings, dates discussed:    Discharge Disposition:   Additional Comments:  Dawayne Patricia, RN 03/04/2018, 10:28 AM

## 2018-03-04 NOTE — Progress Notes (Signed)
PCA discontinued, 1cc Morphine wasted down sink [place], RN witnessed. Last chest tube removed per order, patient tolerating well.  Ambulated x2 and tolerating well, will continue to monitor.

## 2018-03-04 NOTE — Progress Notes (Signed)
3 Days Post-Op Procedure(s) (LRB): VIDEO ASSISTED THORACOSCOPY (Right) DRAINAGE OF PLEURAL EFFUSION (Right) Subjective:  Feels ok. Pain under control  Objective: Vital signs in last 24 hours: Temp:  [97.9 F (36.6 C)-98.5 F (36.9 C)] 98.3 F (36.8 C) (04/08 0721) Pulse Rate:  [69-112] 78 (04/08 0400) Cardiac Rhythm: Normal sinus rhythm (04/08 0400) Resp:  [9-19] 15 (04/08 0400) BP: (94-148)/(48-84) 103/52 (04/08 0400) SpO2:  [93 %-100 %] 97 % (04/08 0740) Weight:  [56.5 kg (124 lb 9.6 oz)] 56.5 kg (124 lb 9.6 oz) (04/08 0600)  Hemodynamic parameters for last 24 hours:    Intake/Output from previous day: 04/07 0701 - 04/08 0700 In: 770 [P.O.:560; I.V.:210] Out: 635 [Urine:575; Chest Tube:60] Intake/Output this shift: No intake/output data recorded.  General appearance: alert and cooperative Heart: regular rate and rhythm, S1, S2 normal, no murmur, click, rub or gallop Lungs: clear to auscultation bilaterally minimal chest tube output, no air leak.  Lab Results: Recent Labs    03/02/18 0500 03/04/18 0506  WBC 8.8 5.6  HGB 8.2* 8.8*  HCT 25.6* 28.2*  PLT 297 350   BMET:  Recent Labs    03/02/18 0500 03/04/18 0506  NA 138 138  K 3.6 3.6  CL 99* 96*  CO2 31 31  GLUCOSE 111* 83  BUN 5* <5*  CREATININE 0.39* 0.39*  CALCIUM 8.2* 8.2*    PT/INR: No results for input(s): LABPROT, INR in the last 72 hours. ABG    Component Value Date/Time   PHART 7.384 03/02/2018 0601   HCO3 32.3 (H) 03/02/2018 0601   O2SAT 97.6 03/02/2018 0601   CBG (last 3)  Recent Labs    03/03/18 1617 03/03/18 2121 03/04/18 0719  GLUCAP 87 104* 82   CXR: ok. The is mild right base atelectasis, no significant effusion.  Assessment/Plan: S/P Procedure(s) (LRB): VIDEO ASSISTED THORACOSCOPY (Right), right mini-thoracotomy DRAINAGE OF PLEURAL EFFUSION (Right)  POD 3  She has been hemodynamically stable in sinus rhythm. Minimal output from remaining tube so will remove it. DC  PCA and use Oxy and Toradol for pain. IS, ambulation Keep central line for access. Transfer to 4E. She can go home when ambulating better.   LOS: 3 days    Gaye Pollack 03/04/2018

## 2018-03-04 NOTE — Progress Notes (Addendum)
Patient arrived from 2 heart. Patient vital signes obtained and placed on monitor and verified with CCMD. Patient sitting on side of bed eating supper. Call bell and phone with in reach. Will monitor patient. Karol Liendo, Bettina Gavia rN

## 2018-03-05 ENCOUNTER — Inpatient Hospital Stay (HOSPITAL_COMMUNITY): Payer: Medicare Other

## 2018-03-05 ENCOUNTER — Ambulatory Visit: Payer: Medicare Other

## 2018-03-05 LAB — TYPE AND SCREEN
ABO/RH(D): A POS
Antibody Screen: NEGATIVE
UNIT DIVISION: 0
Unit division: 0

## 2018-03-05 LAB — BPAM RBC
BLOOD PRODUCT EXPIRATION DATE: 201904292359
Blood Product Expiration Date: 201904292359
ISSUE DATE / TIME: 201904050902
ISSUE DATE / TIME: 201904050902
UNIT TYPE AND RH: 6200
Unit Type and Rh: 6200

## 2018-03-05 NOTE — Progress Notes (Addendum)
      [address]       ,[place] [address]             [phone]      4 Days Post-Op Procedure(s) (LRB): VIDEO ASSISTED THORACOSCOPY (Right) DRAINAGE OF PLEURAL EFFUSION (Right) Subjective: Feels okay this morning. She plans to try and ambulate in the halls today.   Objective: Vital signs in last 24 hours: Temp:  [98 F (36.7 C)-98.8 F (37.1 C)] 98 F (36.7 C) (04/09 0809) Pulse Rate:  [38-111] 38 (04/09 0809) Cardiac Rhythm: Normal sinus rhythm (04/09 0700) Resp:  [12-24] 12 (04/09 0809) BP: (94-139)/(44-71) 124/64 (04/09 0809) SpO2:  [91 %-100 %] 95 % (04/09 0809)    Intake/Output from previous day: 04/08 0701 - 04/09 0700 In: 403.2 [P.O.:360; I.V.:43.2] Out: 10 [Chest Tube:10] Intake/Output this shift: No intake/output data recorded.  General appearance: alert, cooperative and no distress Heart: regular rate and rhythm, S1, S2 normal, no murmur, click, rub or gallop Lungs: clear to auscultation bilaterally Abdomen: soft, non-tender; bowel sounds normal; no masses,  no organomegaly Extremities: extremities normal, atraumatic, no cyanosis or edema Wound: clean and dry right mini thoracotomy incision  Lab Results: Recent Labs    03/04/18 0506  WBC 5.6  HGB 8.8*  HCT 28.2*  PLT 350   BMET:  Recent Labs    03/04/18 0506  NA 138  K 3.6  CL 96*  CO2 31  GLUCOSE 83  BUN <5*  CREATININE 0.39*  CALCIUM 8.2*    PT/INR: No results for input(s): LABPROT, INR in the last 72 hours. ABG    Component Value Date/Time   PHART 7.384 03/02/2018 0601   HCO3 32.3 (H) 03/02/2018 0601   O2SAT 97.6 03/02/2018 0601   CBG (last 3)  Recent Labs    03/04/18 0719 03/04/18 1128 03/04/18 1528  GLUCAP 82 102* 106*    Assessment/Plan: S/P Procedure(s) (LRB): VIDEO ASSISTED THORACOSCOPY (Right) DRAINAGE OF PLEURAL EFFUSION (Right)  1. CV-NSR in the 60s, BP well controlled. Not currently on any agents.  2. Pulm-continue Pulmicort and nebs. CXR  today shows persistent right lower lobe atelectasis with associated small effusion. No pneumothorax. Remains on 3.5L [place] for oxygen support.  3. Renal-creatinine 0.39, electrolytes okay.  4. H and H- 8.8/28.2, will trend 5. Endo-blood glucose well controlled 6. Pain control- on oxycodone and Toradol  Plan: Work on ambulation today and weaning of oxygen. Encouraged incentive spirometer use. Discontinue central line.     LOS: 4 days    Elgie Collard 03/05/2018   Chart reviewed, patient examined, agree with above. CXR shows small residual right pleural effusion at base with atelectasis. She needs to walk and use IS today. Plan home tomorrow.

## 2018-03-05 NOTE — Consult Note (Signed)
   [facility] CM Inpatient Consult   03/05/2018  Melissa Golden February 07, 1946 [id number]  Patient screened for re-admission less than 30 days [place] [facility] services. Patient is in the [place] [facility] the [place] [facility] services under patient's Medicare plan. Chart reviewed for community care management needs. Will follow up with inpatient RNCM for collaboration and transition of care needs.     Patient's primary care provider is listed as Rory Percy, MD in [place].   For questions contact:   Natividad Brood, RN BSN [facility] Liaison  [phone] business mobile phone Toll free office [phone]

## 2018-03-05 NOTE — Discharge Summary (Signed)
Physician Discharge Summary  Patient ID: Melissa Golden MRN: [id number] DOB/AGE: 1945/12/09 72 y.o.  Admit date: 03/01/2018 Discharge date: 03/06/2018  Admission Diagnoses: Patient Active Problem List   Diagnosis Date Noted  . Acute on chronic respiratory failure with hypoxia ([place]) 02/18/2018  . Pleural effusion 02/18/2018  . Carcinoid tumor of appendix 02/18/2018  . COPD (chronic obstructive pulmonary disease) ([place]) 02/18/2018  . Hemothorax on right 02/04/2018  . Malignant neoplasm of right upper lobe of lung ([place]) 01/10/2018    Discharge Diagnoses:  Active Problems:   S/P thoracotomy   Discharged Condition: good  HPI:  The patient is a 72 year old woman with a 100 pk-year smoking history who was admitted to [facility] at the end of November 2017with acute respiratory failure with hypoxemia. She had a CXR that ruled out pneumonia and she was felt to have an exacerbation of COPD. She was treated with bipap, nebs and oxygen with improvement and she was sent home on oxygen. She had a CTA of the chest on admission which showed no evidence of PE but did show a 0.9 x 0.7 cm spiculated density in the RUL which had increased in size compared to a prior CT on 12/14/2014. There was a small stellate density in the subpleural RUL that was unchanged. There was mild ground glass opacity in the lingula and mild consolidation in the posterior LLL that were felt to be consistent with atelectasis or infiltrate. There was a focus of ground-glass density in the lateral subpleural LUL that was felt to be consistent with infection or inflammation. There was a 1.1 cm hyperenhancing lesion in the posterior right hepatic lobe. She underwent a PET scan on 12/20/2017which showed mild hypermetabolism in the 0.8 cm RUL apical lesion with an SUV max of 2.7. There were at least 7 other tiny pulmonary nodules measuring up to 0.5 mm scattered throughout both lungs, all below PET resolution. There was a solitary  hypermetabolic non-enlarged right inguinal lymph node that was felt to be reactive.She was not felt to be a candidate for lung resection due to severe COPD, chronic hypoxemia and dyspnea with minimal activity. The dominant lung lesion was small so we decided to follow it. The dominant lesion was stable at 6 x 8 mm on CT in 11/2016 so we continued following this. 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 The RUL dominant lung lesion was slightly larger on a follow up CT in 09/2017. She had a follow up PET scan on 10/17/2017 that showed three hypermetabolic nodules in the RUL that were increased in size and metabolic activity compared to her prior PET but there was no mediastinal adenopathy. She had a CT guided needle biopsy of the dominant lung lesion that showed non-small cell lung cancer that was adenocarcinoma. She has started Xeloda on 01/07/2018 for her appendiceal adenocarcinoma. A follow up chest CT on 01/21/2018 showed new lymphadenopathy in the right paratracheal and pretracheal region measuring 2 cm and suspicious for metastasis.  He subsequently underwent mediastinoscopy with lymph node biopsy on 02/04/2018.  That procedure was complicated by development of a large right hemothorax noted postoperatively on chest x-ray.  She had a right chest tube placed draining 850 cc of blood immediately and then a small amount of drainage after that.  The chest tube was left in place for couple days and the drainage tailed off as expected.  The chest tube was removed and her initial chest x-ray afterwards  showed a very small residual right pleural effusion. She was readmitted by the medical service on 02/18/2018 with increased shortness of breath and right chest and back pain.A follow-up chest x-ray showed a moderate size right pleural effusion. She had a CT scan of the chest which showed a moderate size right pleural effusion  with partial loculation and some compressive atelectasis of the right lower lobe. She had a right thoracentesis performed by IR and only removed 200 cc of brown fluid. Cultures of this fluid were negative. She said that she felt better for a while after the thoracentesis and was discharged home. She reports that since going home she has had more shortness of breath and is using oxygen around the clock like she has been for quite some time. Her husband reports that she has been weak and is not eating very well and is always uncomfortable.    Hospital Course:  On 03/01/2018 Melissa Golden underwent a right video-assisted thoracoscopy, right mini thoracotomy with drainage of a loculated pleural effusion with Dr. Caffie Pinto.  She tolerated the procedure well and was transferred to the surgical ICU.  She was extubated in a timely manner.  Postop day 1 she continued to have moderate drainage from her chest tubes.  There was no air leak.  We advance her diet as tolerated.  We checked an EKG for irregular rhythm.  We continued Lovenox for DVT prophylaxis.  Postop day 2 we discontinued her anterior chest tube.  We continued her posterior chest tube to waterseal.  Her pain was well controlled.  Postop day 3 she remained in normal sinus rhythm and hemodynamically stable.  We removed her remaining posterior chest tube.  We discontinued her PCA pump and changed her medications to oral.  She was stable to transfer to [place] for continued care. POD 4 she continued to progress. She remained in normal sinus rhythm.  She continued to require 3 and half liters of oxygen for support.  We encouraged use of incentive spirometry for weaning of supplemental oxygen.  Her hemoglobin and hematocrit remained stable.   her pain remained well controlled on oral medication.  We worked on ambulation.  We discontinued her central line.  She continued her Pulmicort and nebulizer treatments. We continued to work on ambulation and weaning of  supplemental oxygen. Her incision was healing well, she was tolerating 2L [place] (what she was on at home), she was ambulating with limited assistance, and she was ready for discharge home with home health.   Consults: None  Significant Diagnostic Studies:  CLINICAL DATA:  Status post thoracotomy  EXAM: CHEST - 2 VIEW  COMPARISON:  03/04/2018  FINDINGS: Cardiac shadow is within normal limits. Aortic calcifications are again seen. Right jugular central line is noted in satisfactory position. The right-sided thoracostomy catheter has been removed in the interval. No pneumothorax is seen. Small right pleural effusion is noted. Right lower lobe consolidation is noted as well. Hyperinflation is noted consistent with COPD. No bony abnormality is noted.  IMPRESSION: Persistent right lower lobe atelectasis with associated small effusion.  No recurrent pneumothorax following chest tube removal.   Electronically Signed   By: Inez Catalina M.D.   On: 03/05/2018 08:39    Treatments:   03/01/2018 Melissa Golden [id number]  Surgeon: Gaye Pollack, MD   First Assistant: Lars Pinks, PA-C  Preoperative Diagnosis: Right pleural effusion  Postoperative Diagnosis: Same  Procedure:  1.  Right video-assisted thoracoscopy 2.  Right mini thoracotomy with drainage of  loculated pleural effusion  Anesthesia: General Endotracheal   Clinical History/Surgical Indication:   She has a moderate size right pleural effusion with compressive atelectasis of the right lower lobe which appears somewhat worse than her chest x-ray immediately after the thoracentesis. The CT scan of the chest done on 02/18/2018 showed partial loculation of this pleural effusion and most likely she had some residual blood from her hemothorax followed by a sympathetic effusion in response to that. With her severe COPD I think even a small pleural effusion with compressive atelectasis could contribute  to more shortness of breath. I think it is probably best to completely drain this effusion to allow full reexpansion of her lung. I am concerned that if we leave it alone she is going to have a progressive downhill course and end up with pneumonia or empyema. She is scheduled to begin radiation therapy next week but I think that would probably have to be postponed a little to allow this problem to be resolved. I think that a right VATSshould allow adequate exposure to remove this effusion. If there is significant loculation that could require a small thoracotomy. I discussed the operative procedure with the patient and her husband including alternatives, benefits, and risks including but not limited to bleeding, blood transfusion, infection, injury to the lung, prolonged air leak, respiratory failure, and recurrence of the effusion. They understand and would like to proceed with surgery.     Discharge Exam: Blood pressure 129/60, pulse 66, temperature 98.7 F (37.1 C), temperature source Oral, resp. rate 15, height 5\' 2"  (1.575 m), weight 124 lb 9.6 oz (56.5 kg), SpO2 100 %.  General appearance: alert, cooperative and no distress Heart: regular rate and rhythm, S1, S2 normal, no murmur, click, rub or gallop Lungs: clear to auscultation bilaterally Abdomen: soft, non-tender; bowel sounds normal; no masses,  no organomegaly Extremities: extremities normal, atraumatic, no cyanosis or edema Wound: clean and dry   Disposition:  Discharge disposition: 01-Home or Self Care        Allergies as of 03/06/2018   No Known Allergies     Medication List    TAKE these medications   acetaminophen 500 MG tablet Commonly known as:  TYLENOL Take 2 tablets (1,000 mg total) by mouth every 6 (six) hours.   albuterol 108 (90 Base) MCG/ACT inhaler Commonly known as:  PROVENTIL HFA;VENTOLIN HFA Inhale 2 puffs into the lungs 3 (three) times daily. Shortness of Breath   budesonide 180 MCG/ACT  inhaler Commonly known as:  PULMICORT Inhale 2 puffs into the lungs 2 (two) times daily.   docusate sodium 100 MG capsule Commonly known as:  COLACE Take 1 capsule (100 mg total) by mouth 2 (two) times daily.   ibuprofen 200 MG tablet Commonly known as:  ADVIL,MOTRIN Take 400 mg by mouth every 6 (six) hours as needed for moderate pain. Pain   multivitamin with minerals Tabs tablet Take 1 tablet by mouth daily.   oxyCODONE 5 MG immediate release tablet Commonly known as:  Oxy IR/ROXICODONE Take 1 tablet (5 mg total) by mouth every 6 (six) hours as needed for severe pain. What changed:    when to take this  reasons to take this            Durable Medical Equipment  (From admission, onward)        Start     Ordered   Unscheduled  For home use only DME oxygen  Once    Question Answer Comment  Mode  or (Route) Nasal cannula   Liters per Minute 2   Frequency Continuous (stationary and portable oxygen unit needed)   Oxygen conserving device Yes   Oxygen delivery system Gas      03/06/18 0810     Follow-up Information    Rory Percy, MD. Call in 1 day(s).   Specialty:  Family Medicine Contact information: [place] [address] [phone]        Gaye Pollack, MD Follow up.   Specialty:  Cardiothoracic Surgery Why:  Your routine follow-up appointment is on Mar 27, 2018 at 9:30 AM.  Please arrive at 9 AM for a chest x-ray located at [facility] imaging which is on the first floor of our building. Contact information: [address] [place] [address] [phone]        chest tube suture removal appointment Follow up.   Why:  Your chest tube suture removal appointment is on March 14, 2018 at 10:00 AM.  This appointment will be with our nursing staff. Contact information: Dr. Vivi Martens office          Signed: Elgie Collard 03/06/2018, 8:10 AM

## 2018-03-05 NOTE — Discharge Instructions (Signed)
Thoracotomy, Care After  This sheet gives you information about how to care for yourself after your procedure. Your health care provider may also give you more specific instructions. If you have problems or questions, contact your health care provider. What can I expect after the procedure? After your procedure, it is common to have:  Pain and swelling around the incision area.  Pain when you breathe in (inhale).  Constipation.  Fatigue.  Loss of appetite.  Trouble sleeping.  Mood swings and depression.  Follow these instructions at home: Preventing pneumonia  Take deep breaths or do breathing exercises as instructed by your health care provider.  Cough frequently. Coughing may cause discomfort, but it is important to clear mucus (phlegm) and expand your lungs. If coughing hurts, hold a pillow against your chest or place both hands flat on top of the incision (splinting) when you cough. This may help relieve discomfort.  Continue to use an incentive spirometer as directed. This is a tool that measures how well you fill your lungs with each breath.  Participate in pulmonary rehabilitation as directed. This is a program that combines education, exercise, and support from a team of specialists. The goal is to help you heal and return to normal activities as soon as possible. Medicines  Take over-the-counter or prescription medicines only as told by your health care provider.  If you have pain, take pain-relieving medicine before your pain becomes severe. This is important because if your pain is under control, you will be able to breathe and cough more comfortably.  If you were prescribed an antibiotic medicine, take it as told by your health care provider. Do not stop taking the antibiotic even if you start to feel better. Activity  Ask your health care provider what activities are safe for you.  Do not travel by airplane for 2 weeks after your chest tube is removed, or until  your health care provider says that this is safe.  Do not lift anything that is heavier than 10 lb (4.5 kg), or the limit that your health care provider tells you, until he or she says that it is safe.  Do not drive until your health care provider approves. ? Do not drive or use heavy machinery while taking prescription pain medicine. Incision care  Follow instructions from your health care provider about how to take care of your incision. Make sure you: ? Wash your hands with soap and water before you change your bandage (dressing). If soap and water are not available, use hand sanitizer. ? Change your dressing as told by your health care provider. ? Leave stitches (sutures), skin glue, or adhesive strips in place. These skin closures may need to stay in place for 2 weeks or longer. If adhesive strip edges start to loosen and curl up, you may trim the loose edges. Do not remove adhesive strips completely unless your health care provider tells you to do that.  Keep your dressing dry.  Check your incision area every day for signs of infection. Check for: ? More redness, swelling, or pain. ? More fluid or blood. ? Warmth. ? Pus or a bad smell. Bathing  Do not take baths, swim, or use a hot tub until your health care provider approves. You may take showers.  After your dressing has been removed, use soap and water to gently wash your incision area. Do not use anything else to clean your incision unless your health care provider tells you to do that. Eating  and drinking  Eat a healthy diet as instructed by your health care provider. A healthy diet includes plenty of fresh fruits and vegetables, whole grains, and low-fat (lean) proteins.  Drink enough fluid to keep your urine clear or pale yellow. General instructions  To prevent or treat constipation while you are taking prescription pain medicine, your health care provider may recommend that you: ? Take over-the-counter or prescription  medicines. ? Eat foods that are high in fiber, such as fresh fruits and vegetables, whole grains, and beans. ? Limit foods that are high in fat and processed sugars, such as fried and sweet foods.  Do not use any products that contain nicotine or tobacco, such as cigarettes and e-cigarettes. If you need help quitting, ask your health care provider.  Avoid secondhand smoke.  Wear compression stockings as told by your health care provider. These stockings help to prevent blood clots and reduce swelling in your legs.  If you have a chest tube, care for it as instructed.  Keep all follow-up visits as told by your health care provider. This is important. Contact a health care provider if:  You have more redness, swelling, or pain around your incision.  You have more fluid or blood coming from your incision.  Your incision feels warm to the touch.  You have pus or a bad smell coming from your incision.  You have a fever or chills.  Your heartbeat seems irregular.  You have nausea or vomiting.  You have muscle aches.  You are constipated. This may mean that you have: ? Fewer bowel movements in a week than normal. ? Difficulty having a bowel movement. ? Stools that are dry, hard, or larger than normal. Get help right away if:  You develop a rash.  You feel light-headed or feel like you are going to faint.  You have shortness of breath or trouble breathing.  You are confused.  You have trouble speaking.  You have vision problems.  You are not able to move.  You have numbness in your face, arms, or legs.  You lose consciousness.  You have a sudden, severe headache.  You feel weak.  You have chest pain.  You have pain that: ? Is severe. ? Gets worse, even with medicine. Summary  To prevent pneumonia, take deep breaths, do breathing exercises, and cough frequently, as instructed by your health care provider.  Do not drive until your health care provider  approves. Do not travel by airplane for 2 weeks after your chest tube is removed, or until your health care provider approves.  Check your incision area every day for signs of infection.  Eat a healthy diet that includes plenty of fresh fruits and vegetables, whole grains, and low-fat (lean) proteins. This information is not intended to replace advice given to you by your health care provider. Make sure you discuss any questions you have with your health care provider. Document Released: 04/28/2011 Document Revised: 08/07/2016 Document Reviewed: 08/07/2016 Elsevier Interactive Patient Education  2017 [employer].

## 2018-03-06 ENCOUNTER — Other Ambulatory Visit: Payer: Medicare Other

## 2018-03-06 ENCOUNTER — Telehealth: Payer: Self-pay | Admitting: *Deleted

## 2018-03-06 ENCOUNTER — Ambulatory Visit: Payer: Medicare Other | Admitting: Radiation Oncology

## 2018-03-06 ENCOUNTER — Encounter: Payer: Self-pay | Admitting: *Deleted

## 2018-03-06 ENCOUNTER — Ambulatory Visit: Payer: Medicare Other

## 2018-03-06 ENCOUNTER — Ambulatory Visit: Payer: Medicare Other | Admitting: Nurse Practitioner

## 2018-03-06 MED ORDER — OXYCODONE HCL 5 MG PO TABS: 5.0000 mg | ORAL_TABLET | Freq: Four times a day (QID) | ORAL | 0 refills | Status: DC | PRN

## 2018-03-06 MED ORDER — ACETAMINOPHEN 500 MG PO TABS: 1000.0000 mg | ORAL_TABLET | Freq: Four times a day (QID) | ORAL | 0 refills | Status: AC

## 2018-03-06 NOTE — Care Management Note (Signed)
Case Management Note Marvetta Gibbons RN, BSN Unit 4E-Case Manager- [place] coverage [phone]  Patient Details  Name: NATAUSHA JUNGWIRTH MRN: [id number] Date of Birth: [birth year]  Subjective/Objective:   Pt admitted s/p VIDEO ASSISTED THORACOSCOPY (Right) DRAINAGE OF PLEURAL EFFUSION                  Action/Plan: PTA pt lived at home with spouse in a mobile home, has baseline home 02 at 3L with [facility]. Also has RW and cane at home however does not use them much in the home due to space. Anticipate return home with spouse- CM to follow for transition of care needs.   Expected Discharge Date:  03/06/18               Expected Discharge Plan:  Home/Self Care  In-House Referral:     Discharge planning Services  CM Consult  Post Acute Care Choice:  Home Health Choice offered to:  Patient  DME Arranged:  Oxygen DME Agency:  [place] Arranged:  RN [facility] Agency:  [place]  Status of Service:  Completed, signed off  If discussed at [place] of Stay Meetings, dates discussed:    Discharge Disposition: home/home health   Additional Comments:  03/06/18- 1000- Marvetta Gibbons RN, CM- pt for d/c home today- orders have been placed for [facility] and updated DME for home 02- spoke with pt at bedside- choice offered for [facility] agency- per states she will use [facility] as she already has home 02 with them- will have [place] update 02 liter flow per new orders placed- call made to Butch Penny with [facility] for [facility] referral and new 02 order for updated liter flow- Butch Penny will send new 02 order into main [facility] office - [facility] referral has been accepted.   Dawayne Patricia, RN 03/06/2018, 10:32 AM

## 2018-03-06 NOTE — Progress Notes (Signed)
Oncology Nurse Navigator Documentation  Oncology Nurse Navigator Flowsheets 03/06/2018  Navigator Location [facility]  Navigator Encounter Type Telephone/per Dr. Benay Spice he would like to see patient on 03/13/18.  I called [place] 4E unit and spoke with her nurse. I then was able to speak to patient. She sounds good and strong voice on the phone. I updated her on appt. I will updated Dr. Benay Spice I spoke with patient and she is scheduled.   Telephone Outgoing Call  Treatment Initiated Date 03/07/2018  Patient Visit Type Inpatient  Treatment Phase Pre-Tx/Tx Discussion  Barriers/Navigation Needs Coordination of Care;Education  Education Other  Interventions Coordination of Care;Education  Coordination of Care Appts  Education Method Verbal  Acuity Level 2  Time Spent with Patient 71

## 2018-03-06 NOTE — Telephone Encounter (Signed)
Called the patient to follow-up with her regarding her recent discharge from the hospital.  She noted that she would not be able to make it for her treatment appointment on Thursday and would like to come Friday.  After speaking with Dr. Lisbeth Renshaw and Bryson Ha it is advised that the rest of her appointments this week are canceled and start fresh on Monday 4/15.  I let Mrs. Falwell know and she was in agreement to this plan and I let her know what time.  Will continue to follow as necessary.  Cori Razor, RN

## 2018-03-06 NOTE — Progress Notes (Signed)
SATURATION QUALIFICATIONS:   Patient Saturations on Room Air at Rest = 96%  Patient Saturations on Room Air while Ambulating = 86%  Patient Saturations on 2 Liters of oxygen while Ambulating 94%

## 2018-03-06 NOTE — Progress Notes (Signed)
Reviewed and educated pt on d/c medications, Rx, follow-up appointment, incision care when to call the MD. Answered all questions fully. Pt has all belongings, paperwork and Rx.  IV site and telemetry removed. D/C via wheelchair with family.  Pola Corn, RN

## 2018-03-06 NOTE — Progress Notes (Addendum)
      [address]       [place] [address]             [phone]      5 Days Post-Op Procedure(s) (LRB): VIDEO ASSISTED THORACOSCOPY (Right) DRAINAGE OF PLEURAL EFFUSION (Right) Subjective: She feels good this morning and is ready for home.   Objective: Vital signs in last 24 hours: Temp:  [98 F (36.7 C)-98.7 F (37.1 C)] 98.7 F (37.1 C) (04/10 0549) Pulse Rate:  [38-120] 66 (04/10 0549) Cardiac Rhythm: Sinus tachycardia (04/10 0742) Resp:  [10-24] 15 (04/10 0549) BP: (119-134)/(60-78) 129/60 (04/10 0549) SpO2:  [83 %-100 %] 100 % (04/10 0549)     Intake/Output from previous day: 04/09 0701 - 04/10 0700 In: 480 [P.O.:480] Out: -  Intake/Output this shift: No intake/output data recorded.  General appearance: alert, cooperative and no distress Heart: regular rate and rhythm, S1, S2 normal, no murmur, click, rub or gallop Lungs: clear to auscultation bilaterally Abdomen: soft, non-tender; bowel sounds normal; no masses,  no organomegaly Extremities: extremities normal, atraumatic, no cyanosis or edema Wound: clean and dry  Lab Results: Recent Labs    03/04/18 0506  WBC 5.6  HGB 8.8*  HCT 28.2*  PLT 350   BMET:  Recent Labs    03/04/18 0506  NA 138  K 3.6  CL 96*  CO2 31  GLUCOSE 83  BUN <5*  CREATININE 0.39*  CALCIUM 8.2*    PT/INR: No results for input(s): LABPROT, INR in the last 72 hours. ABG    Component Value Date/Time   PHART 7.384 03/02/2018 0601   HCO3 32.3 (H) 03/02/2018 0601   O2SAT 97.6 03/02/2018 0601   CBG (last 3)  Recent Labs    03/04/18 0719 03/04/18 1128 03/04/18 1528  GLUCAP 82 102* 106*    Assessment/Plan: S/P Procedure(s) (LRB): VIDEO ASSISTED THORACOSCOPY (Right) DRAINAGE OF PLEURAL EFFUSION (Right)   1. CV-NSR to ST in the 100s, with occasional PVC.  BP well controlled. Not currently on any agents.  2. Pulm-continue Pulmicort and nebs. CXR today shows persistent right lower lobe  atelectasis with associated small effusion. No pneumothorax. Remains on 2L [place] for oxygen support.  3. Renal-creatinine 0.39, electrolytes okay.  4. H and H- 8.8/28.2, will trend 5. Endo-blood glucose well controlled 6. Pain control- on oxycodone and Toradol  Plan: 6 minute walk test. She is ambulating with limited assistance. She does require oxygen at home but will  order home health for possible weaning.      LOS: 5 days    Melissa Golden 03/06/2018  Chart reviewed, patient examined, agree with above. She feels better and is up walking around. Plan home today and I will see her in a couple weeks with a CXR and will remove the chest tube sutures at that time.

## 2018-03-07 ENCOUNTER — Ambulatory Visit: Payer: Medicare Other

## 2018-03-08 ENCOUNTER — Ambulatory Visit: Payer: Medicare Other

## 2018-03-10 ENCOUNTER — Other Ambulatory Visit: Payer: Self-pay | Admitting: Oncology

## 2018-03-10 DIAGNOSIS — J9589 Other postprocedural complications and disorders of respiratory system, not elsewhere classified: Secondary | ICD-10-CM | POA: Diagnosis not present

## 2018-03-10 DIAGNOSIS — C3411 Malignant neoplasm of upper lobe, right bronchus or lung: Secondary | ICD-10-CM | POA: Diagnosis not present

## 2018-03-10 DIAGNOSIS — Z6822 Body mass index (BMI) 22.0-22.9, adult: Secondary | ICD-10-CM | POA: Diagnosis not present

## 2018-03-11 ENCOUNTER — Ambulatory Visit
Admission: RE | Admit: 2018-03-11 | Discharge: 2018-03-11 | Disposition: A | Discharge status: Home / Self Care | Payer: Medicare Other | Source: Ambulatory Visit | Attending: Radiation Oncology | Admitting: Radiation Oncology

## 2018-03-11 DIAGNOSIS — C3411 Malignant neoplasm of upper lobe, right bronchus or lung: Secondary | ICD-10-CM | POA: Diagnosis not present

## 2018-03-11 DIAGNOSIS — Z510 Encounter for antineoplastic radiation therapy: Secondary | ICD-10-CM | POA: Diagnosis not present

## 2018-03-12 ENCOUNTER — Ambulatory Visit
Admission: RE | Admit: 2018-03-12 | Discharge: 2018-03-12 | Disposition: A | Discharge status: Home / Self Care | Payer: Medicare Other | Source: Ambulatory Visit | Attending: Radiation Oncology | Admitting: Radiation Oncology

## 2018-03-12 DIAGNOSIS — Z510 Encounter for antineoplastic radiation therapy: Secondary | ICD-10-CM | POA: Diagnosis not present

## 2018-03-12 DIAGNOSIS — C3411 Malignant neoplasm of upper lobe, right bronchus or lung: Secondary | ICD-10-CM | POA: Diagnosis not present

## 2018-03-13 ENCOUNTER — Other Ambulatory Visit: Payer: Medicare Other

## 2018-03-13 ENCOUNTER — Inpatient Hospital Stay (HOSPITAL_BASED_OUTPATIENT_CLINIC_OR_DEPARTMENT_OTHER): Payer: Medicare Other | Admitting: Oncology

## 2018-03-13 ENCOUNTER — Ambulatory Visit
Admission: RE | Admit: 2018-03-13 | Discharge: 2018-03-13 | Disposition: A | Discharge status: Home / Self Care | Payer: Medicare Other | Source: Ambulatory Visit | Attending: Radiation Oncology | Admitting: Radiation Oncology

## 2018-03-13 ENCOUNTER — Inpatient Hospital Stay: Payer: Medicare Other | Attending: Nurse Practitioner

## 2018-03-13 VITALS — BP 123/62 | HR 87 | Temp 97.9°F | Resp 20 | Ht 62.0 in | Wt 124.8 lb

## 2018-03-13 VITALS — BP 143/72 | HR 65 | Temp 98.9°F | Resp 18

## 2018-03-13 DIAGNOSIS — Z510 Encounter for antineoplastic radiation therapy: Secondary | ICD-10-CM | POA: Diagnosis not present

## 2018-03-13 DIAGNOSIS — Z79899 Other long term (current) drug therapy: Secondary | ICD-10-CM | POA: Insufficient documentation

## 2018-03-13 DIAGNOSIS — C771 Secondary and unspecified malignant neoplasm of intrathoracic lymph nodes: Secondary | ICD-10-CM | POA: Insufficient documentation

## 2018-03-13 DIAGNOSIS — Z5111 Encounter for antineoplastic chemotherapy: Secondary | ICD-10-CM | POA: Diagnosis not present

## 2018-03-13 DIAGNOSIS — J449 Chronic obstructive pulmonary disease, unspecified: Secondary | ICD-10-CM | POA: Insufficient documentation

## 2018-03-13 DIAGNOSIS — C7A020 Malignant carcinoid tumor of the appendix: Secondary | ICD-10-CM

## 2018-03-13 DIAGNOSIS — C3411 Malignant neoplasm of upper lobe, right bronchus or lung: Secondary | ICD-10-CM | POA: Diagnosis not present

## 2018-03-13 MED ORDER — DIPHENHYDRAMINE HCL 50 MG/ML IJ SOLN
25.0000 mg | Freq: Once | INTRAMUSCULAR | Status: AC
  Administered 2018-03-13: 25 mg via INTRAVENOUS

## 2018-03-13 MED ORDER — PROCHLORPERAZINE MALEATE 5 MG PO TABS: 5.0000 mg | ORAL_TABLET | Freq: Four times a day (QID) | ORAL | 0 refills | Status: DC | PRN

## 2018-03-13 MED ORDER — PALONOSETRON HCL INJECTION 0.25 MG/5ML
INTRAVENOUS | Status: AC
  Filled 2018-03-13: qty 5

## 2018-03-13 MED ORDER — CARBOPLATIN CHEMO INJECTION 450 MG/45ML
146.2000 mg | Freq: Once | INTRAVENOUS | Status: AC
  Administered 2018-03-13: 150 mg via INTRAVENOUS
  Filled 2018-03-13: qty 15

## 2018-03-13 MED ORDER — FAMOTIDINE IN NACL 20-0.9 MG/50ML-% IV SOLN
INTRAVENOUS | Status: AC
  Filled 2018-03-13: qty 50

## 2018-03-13 MED ORDER — SODIUM CHLORIDE 0.9 % IV SOLN
20.0000 mg | Freq: Once | INTRAVENOUS | Status: AC
  Administered 2018-03-13: 20 mg via INTRAVENOUS
  Filled 2018-03-13: qty 2

## 2018-03-13 MED ORDER — PALONOSETRON HCL INJECTION 0.25 MG/5ML
0.2500 mg | Freq: Once | INTRAVENOUS | Status: AC
  Administered 2018-03-13: 0.25 mg via INTRAVENOUS

## 2018-03-13 MED ORDER — SODIUM CHLORIDE 0.9 % IV SOLN
Freq: Once | INTRAVENOUS | Status: AC
  Administered 2018-03-13: 13:00:00 via INTRAVENOUS

## 2018-03-13 MED ORDER — DIPHENHYDRAMINE HCL 50 MG/ML IJ SOLN
INTRAMUSCULAR | Status: AC
  Filled 2018-03-13: qty 1

## 2018-03-13 MED ORDER — FAMOTIDINE IN NACL 20-0.9 MG/50ML-% IV SOLN
20.0000 mg | Freq: Once | INTRAVENOUS | Status: AC
  Administered 2018-03-13: 20 mg via INTRAVENOUS

## 2018-03-13 MED ORDER — SODIUM CHLORIDE 0.9 % IV SOLN
45.0000 mg/m2 | Freq: Once | INTRAVENOUS | Status: AC
  Administered 2018-03-13: 72 mg via INTRAVENOUS
  Filled 2018-03-13: qty 12

## 2018-03-13 NOTE — Progress Notes (Signed)
Pt ok to treat with labs from 03/04/18 per Dr. Benay Spice. Infusion RN made aware.

## 2018-03-13 NOTE — Progress Notes (Signed)
Ok to use labs from 4/8 per Dr. Benay Spice

## 2018-03-13 NOTE — Progress Notes (Signed)
[facility] OFFICE PROGRESS NOTE   Diagnosis: Non-small cell lung cancer  INTERVAL HISTORY:   Melissa Golden was discharged from the hospital after surgical drainage of a loculated right pleural effusion.  She reports improvement in dyspnea since discharge from the hospital. She started chest radiation 03/11/2018.  She took Decadron premedication last night.  She is here to begin cycle 1 Taxol/carboplatin. Objective:  Vital signs in last 24 hours:  Blood pressure 123/62, pulse 87, temperature 97.9 F (36.6 C), temperature source Oral, resp. rate 20, height '5\' 2"'$  (1.575 m), weight 124 lb 12.8 oz (56.6 kg), SpO2 100 %.    Lymphatics: No cervical or supraclavicular nodes Resp: Distant breath sounds, no respiratory distress Cardio: Regular rate and rhythm GI: No hepatomegaly, nontender Vascular: Trace pitting edema at the left greater than right ankle Skin: Superficial abrasion at the mid right posterior chest, chest tube sites with sutures in place at the right posterior chest, healing thoracotomy incision at the right posterior chest   Lab Results:  Lab Results  Component Value Date   WBC 5.6 03/04/2018   HGB 8.8 (L) 03/04/2018   HCT 28.2 (L) 03/04/2018   MCV 97.2 03/04/2018   PLT 350 03/04/2018   NEUTROABS 6.8 02/18/2018    CMP     Component Value Date/Time   NA 138 03/04/2018 0506   K 3.6 03/04/2018 0506   CL 96 (L) 03/04/2018 0506   CO2 31 03/04/2018 0506   GLUCOSE 83 03/04/2018 0506   BUN <5 (L) 03/04/2018 0506   CREATININE 0.39 (L) 03/04/2018 0506   CREATININE 0.64 02/12/2018 0808   CALCIUM 8.2 (L) 03/04/2018 0506   PROT 6.8 03/01/2018 0641   ALBUMIN 2.9 (L) 03/01/2018 0641   AST 16 03/01/2018 0641   AST 19 02/12/2018 0808   ALT 10 (L) 03/01/2018 0641   ALT 14 02/12/2018 0808   ALKPHOS 74 03/01/2018 0641   BILITOT 0.5 03/01/2018 0641   BILITOT 1.1 02/12/2018 0808   GFRNONAA >60 03/04/2018 0506   GFRNONAA >60 02/12/2018 0808   GFRAA >60  03/04/2018 0506   GFRAA >60 02/12/2018 0808    Medications: I have reviewed the patient's current medications.   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  Status postlaparoscopic appendectomy 09/12/2017. Postoperative course complicated by an ileus requiring TPN.   Final pathology of the appendix-5 cm adenocarcinoma/goblet cell carcinoid. The tumor extended through the muscularis propria into subserosal soft tissue and mesoappendix and focally to the serosal surface. Lymphovascular space invasion identified. Perineural invasion identified. Associated acute appendicitis with perforation.  10/15/2017 CEA 1.8  11/22/2017 chromogranin A-3  Status postlaparoscopic right hemicolectomy by Dr. Janace Litten 12/07/2017. No carcinomatosis or liver metastases were seen.   Final pathologyshowed metastatic adenocarcinoma/goblet cell carcinoid in 3 of 15 pericolic lymph nodes; maximum size of largest metastasis 0.2 cm; positive for extracapsular extension. Benign terminal ileum with fibrous serosal adhesions, negative for dysplasia or carcinoma; surgically absent appendix; appendectomy site with postsurgical changes and fibrous serosal adhesions, negative for carcinoma; proximal and distal mucosal margins negative for dysplasia or carcinoma; mesenteric resection margin negative for carcinoma.  Cycle 1 adjuvant Xeloda 01/07/2018  Cycle 2 adjuvant Xeloda 01/28/2018 2. Non-small cell lung cancer  PET [id number] hypermetabolic right upper lobe pulmonary nodules increased in size and metabolic activity. No mediastinal nodal metastasis. 4 mm right upper lobe nodule with metabolic activity, SUV 3.6. A  third right upper lobe nodule measures 4 mm and has faint metabolic activity. No clear evidence of malignancy in the  abdomen or pelvis. Mild activity along the ventral abdominal surgical scar. Scattered activity in small bowel without focal lesion on CT.  CT biopsy right upper lobe nodule 11/02/2017 showed non-small cell carcinoma; malignant cells positive for TTF-1 and negative for p63. PD-L1 95%.  Chest CT 01/21/2018-new mediastinal adenopathy seen in the prevascular and right paratracheal stations. Largest lymph node is seen in the prevascular space measuring 2.0 cm, previously 6 mm. No hilar or axillary adenopathy. Right lung nodules are stable.  Biopsy of a right paratracheal lymph node on 02/04/2018-metastatic adenocarcinoma consistent with a lung primary  Chest radiation started 03/11/2018  Week 1 Taxol/carboplatin 03/13/2018 3. COPD  4.   VATS drainage of a loculated right pleural effusion 03/01/2018   Disposition: Melissa Golden appears stable.  She started chest radiation earlier this week.  She will begin weekly Taxol/carboplatin chemotherapy today.  We discussed potential toxicities associated with this chemotherapy regimen including the chance of a severe allergic reaction.  She agrees to proceed.  She will continue daily radiation.  She will return for chemotherapy in 1 week.  She will be scheduled for an office visit and chemotherapy on 03/27/2018.  We discussed indications for alternative medications.  I do not recommend vitamins or other supplements while being treated with chemotherapy. We will consider adding immunotherapy at the completion of the chemotherapy/radiation.  25 minutes were spent with the patient today.  The majority of the time was used for counseling and coordination of care.    Betsy Coder, MD  03/13/2018  1:58 PM

## 2018-03-13 NOTE — Patient Instructions (Addendum)
[facility] Discharge Instructions for Patients Receiving Chemotherapy  Today you received the following chemotherapy agents taxol/carboplatin   To help prevent nausea and vomiting after your treatment, we encourage you to take your nausea medication as directed.   If you develop nausea and vomiting that is not controlled by your nausea medication, call the clinic.   BELOW ARE SYMPTOMS THAT SHOULD BE REPORTED IMMEDIATELY:  *FEVER GREATER THAN 100.5 F  *CHILLS WITH OR WITHOUT FEVER  NAUSEA AND VOMITING THAT IS NOT CONTROLLED WITH YOUR NAUSEA MEDICATION  *UNUSUAL SHORTNESS OF BREATH  *UNUSUAL BRUISING OR BLEEDING  TENDERNESS IN MOUTH AND THROAT WITH OR WITHOUT PRESENCE OF ULCERS  *URINARY PROBLEMS  *BOWEL PROBLEMS  UNUSUAL RASH Items with * indicate a potential emergency and should be followed up as soon as possible.  Feel free to call the clinic you have any questions or concerns. The clinic phone number is [phone].  Paclitaxel injection What is this medicine? PACLITAXEL (PAK li TAX el) is a chemotherapy drug. It targets fast dividing cells, like cancer cells, and causes these cells to die. This medicine is used to treat ovarian cancer, breast cancer, and other cancers. This medicine may be used for other purposes; ask your health care provider or pharmacist if you have questions. COMMON BRAND NAME(S): Onxol, Taxol What should I tell my health care provider before I take this medicine? They need to know if you have any of these conditions: -blood disorders -irregular heartbeat -infection (especially a virus infection such as chickenpox, cold sores, or herpes) -liver disease -previous or ongoing radiation therapy -an unusual or allergic reaction to paclitaxel, alcohol, polyoxyethylated castor oil, other chemotherapy agents, other medicines, foods, dyes, or preservatives -pregnant or trying to get pregnant -breast-feeding How should I use this  medicine? This drug is given as an infusion into a vein. It is administered in a hospital or clinic by a specially trained health care professional. Talk to your pediatrician regarding the use of this medicine in children. Special care may be needed. Overdosage: If you think you have taken too much of this medicine contact a poison control center or emergency room at once. NOTE: This medicine is only for you. Do not share this medicine with others. What if I miss a dose? It is important not to miss your dose. Call your doctor or health care professional if you are unable to keep an appointment. What may interact with this medicine? Do not take this medicine with any of the following medications: -disulfiram -metronidazole This medicine may also interact with the following medications: -cyclosporine -diazepam -ketoconazole -medicines to increase blood counts like filgrastim, pegfilgrastim, sargramostim -other chemotherapy drugs like cisplatin, doxorubicin, epirubicin, etoposide, teniposide, vincristine -quinidine -testosterone -vaccines -verapamil Talk to your doctor or health care professional before taking any of these medicines: -acetaminophen -aspirin -ibuprofen -ketoprofen -naproxen This list may not describe all possible interactions. Give your health care provider a list of all the medicines, herbs, non-prescription drugs, or dietary supplements you use. Also tell them if you smoke, drink alcohol, or use illegal drugs. Some items may interact with your medicine. What should I watch for while using this medicine? Your condition will be monitored carefully while you are receiving this medicine. You will need important blood work done while you are taking this medicine. This medicine can cause serious allergic reactions. To reduce your risk you will need to take other medicine(s) before treatment with this medicine. If you experience allergic reactions like skin rash, itching  or  hives, swelling of the face, lips, or tongue, tell your doctor or health care professional right away. In some cases, you may be given additional medicines to help with side effects. Follow all directions for their use. This drug may make you feel generally unwell. This is not uncommon, as chemotherapy can affect healthy cells as well as cancer cells. Report any side effects. Continue your course of treatment even though you feel ill unless your doctor tells you to stop. Call your doctor or health care professional for advice if you get a fever, chills or sore throat, or other symptoms of a cold or flu. Do not treat yourself. This drug decreases your body's ability to fight infections. Try to avoid being around people who are sick. This medicine may increase your risk to bruise or bleed. Call your doctor or health care professional if you notice any unusual bleeding. Be careful brushing and flossing your teeth or using a toothpick because you may get an infection or bleed more easily. If you have any dental work done, tell your dentist you are receiving this medicine. Avoid taking products that contain aspirin, acetaminophen, ibuprofen, naproxen, or ketoprofen unless instructed by your doctor. These medicines may hide a fever. Do not become pregnant while taking this medicine. Women should inform their doctor if they wish to become pregnant or think they might be pregnant. There is a potential for serious side effects to an unborn child. Talk to your health care professional or pharmacist for more information. Do not breast-feed an infant while taking this medicine. Men are advised not to father a child while receiving this medicine. This product may contain alcohol. Ask your pharmacist or healthcare provider if this medicine contains alcohol. Be sure to tell all healthcare providers you are taking this medicine. Certain medicines, like metronidazole and disulfiram, can cause an unpleasant reaction when  taken with alcohol. The reaction includes flushing, headache, nausea, vomiting, sweating, and increased thirst. The reaction can last from 30 minutes to several hours. What side effects may I notice from receiving this medicine? Side effects that you should report to your doctor or health care professional as soon as possible: -allergic reactions like skin rash, itching or hives, swelling of the face, lips, or tongue -low blood counts - This drug may decrease the number of white blood cells, red blood cells and platelets. You may be at increased risk for infections and bleeding. -signs of infection - fever or chills, cough, sore throat, pain or difficulty passing urine -signs of decreased platelets or bleeding - bruising, pinpoint red spots on the skin, black, tarry stools, nosebleeds -signs of decreased red blood cells - unusually weak or tired, fainting spells, lightheadedness -breathing problems -chest pain -high or low blood pressure -mouth sores -nausea and vomiting -pain, swelling, redness or irritation at the injection site -pain, tingling, numbness in the hands or feet -slow or irregular heartbeat -swelling of the ankle, feet, hands Side effects that usually do not require medical attention (report to your doctor or health care professional if they continue or are bothersome): -bone pain -complete hair loss including hair on your head, underarms, pubic hair, eyebrows, and eyelashes -changes in the color of fingernails -diarrhea -loosening of the fingernails -loss of appetite -muscle or joint pain -red flush to skin -sweating This list may not describe all possible side effects. Call your doctor for medical advice about side effects. You may report side effects to FDA at 1-800-FDA-1088. Where should I keep my medicine?  This drug is given in a hospital or clinic and will not be stored at home. NOTE: This sheet is a summary. It may not cover all possible information. If you have  questions about this medicine, talk to your doctor, pharmacist, or health care provider.  2018 Elsevier/Gold Standard (2015-09-14 19:58:00)  Carboplatin injection What is this medicine? CARBOPLATIN (KAR boe pla tin) is a chemotherapy drug. It targets fast dividing cells, like cancer cells, and causes these cells to die. This medicine is used to treat ovarian cancer and many other cancers. This medicine may be used for other purposes; ask your health care provider or pharmacist if you have questions. COMMON BRAND NAME(S): Paraplatin What should I tell my health care provider before I take this medicine? They need to know if you have any of these conditions: -blood disorders -hearing problems -kidney disease -recent or ongoing radiation therapy -an unusual or allergic reaction to carboplatin, cisplatin, other chemotherapy, other medicines, foods, dyes, or preservatives -pregnant or trying to get pregnant -breast-feeding How should I use this medicine? This drug is usually given as an infusion into a vein. It is administered in a hospital or clinic by a specially trained health care professional. Talk to your pediatrician regarding the use of this medicine in children. Special care may be needed. Overdosage: If you think you have taken too much of this medicine contact a poison control center or emergency room at once. NOTE: This medicine is only for you. Do not share this medicine with others. What if I miss a dose? It is important not to miss a dose. Call your doctor or health care professional if you are unable to keep an appointment. What may interact with this medicine? -medicines for seizures -medicines to increase blood counts like filgrastim, pegfilgrastim, sargramostim -some antibiotics like amikacin, gentamicin, neomycin, streptomycin, tobramycin -vaccines Talk to your doctor or health care professional before taking any of these  medicines: -acetaminophen -aspirin -ibuprofen -ketoprofen -naproxen This list may not describe all possible interactions. Give your health care provider a list of all the medicines, herbs, non-prescription drugs, or dietary supplements you use. Also tell them if you smoke, drink alcohol, or use illegal drugs. Some items may interact with your medicine. What should I watch for while using this medicine? Your condition will be monitored carefully while you are receiving this medicine. You will need important blood work done while you are taking this medicine. This drug may make you feel generally unwell. This is not uncommon, as chemotherapy can affect healthy cells as well as cancer cells. Report any side effects. Continue your course of treatment even though you feel ill unless your doctor tells you to stop. In some cases, you may be given additional medicines to help with side effects. Follow all directions for their use. Call your doctor or health care professional for advice if you get a fever, chills or sore throat, or other symptoms of a cold or flu. Do not treat yourself. This drug decreases your body's ability to fight infections. Try to avoid being around people who are sick. This medicine may increase your risk to bruise or bleed. Call your doctor or health care professional if you notice any unusual bleeding. Be careful brushing and flossing your teeth or using a toothpick because you may get an infection or bleed more easily. If you have any dental work done, tell your dentist you are receiving this medicine. Avoid taking products that contain aspirin, acetaminophen, ibuprofen, naproxen, or ketoprofen unless instructed  by your doctor. These medicines may hide a fever. Do not become pregnant while taking this medicine. Women should inform their doctor if they wish to become pregnant or think they might be pregnant. There is a potential for serious side effects to an unborn child. Talk to  your health care professional or pharmacist for more information. Do not breast-feed an infant while taking this medicine. What side effects may I notice from receiving this medicine? Side effects that you should report to your doctor or health care professional as soon as possible: -allergic reactions like skin rash, itching or hives, swelling of the face, lips, or tongue -signs of infection - fever or chills, cough, sore throat, pain or difficulty passing urine -signs of decreased platelets or bleeding - bruising, pinpoint red spots on the skin, black, tarry stools, nosebleeds -signs of decreased red blood cells - unusually weak or tired, fainting spells, lightheadedness -breathing problems -changes in hearing -changes in vision -chest pain -high blood pressure -low blood counts - This drug may decrease the number of white blood cells, red blood cells and platelets. You may be at increased risk for infections and bleeding. -nausea and vomiting -pain, swelling, redness or irritation at the injection site -pain, tingling, numbness in the hands or feet -problems with balance, talking, walking -trouble passing urine or change in the amount of urine Side effects that usually do not require medical attention (report to your doctor or health care professional if they continue or are bothersome): -hair loss -loss of appetite -metallic taste in the mouth or changes in taste This list may not describe all possible side effects. Call your doctor for medical advice about side effects. You may report side effects to FDA at 1-800-FDA-1088. Where should I keep my medicine? This drug is given in a hospital or clinic and will not be stored at home. NOTE: This sheet is a summary. It may not cover all possible information. If you have questions about this medicine, talk to your doctor, pharmacist, or health care provider.  2018 Elsevier/Gold Standard (2008-02-18 14:38:05)

## 2018-03-14 ENCOUNTER — Ambulatory Visit
Admission: RE | Admit: 2018-03-14 | Discharge: 2018-03-14 | Disposition: A | Discharge status: Home / Self Care | Payer: Medicare Other | Source: Ambulatory Visit | Attending: Radiation Oncology | Admitting: Radiation Oncology

## 2018-03-14 ENCOUNTER — Other Ambulatory Visit: Payer: Self-pay

## 2018-03-14 ENCOUNTER — Ambulatory Visit (INDEPENDENT_AMBULATORY_CARE_PROVIDER_SITE_OTHER): Payer: Self-pay

## 2018-03-14 ENCOUNTER — Telehealth: Payer: Self-pay | Admitting: Oncology

## 2018-03-14 DIAGNOSIS — C3411 Malignant neoplasm of upper lobe, right bronchus or lung: Secondary | ICD-10-CM | POA: Diagnosis not present

## 2018-03-14 DIAGNOSIS — Z510 Encounter for antineoplastic radiation therapy: Secondary | ICD-10-CM | POA: Diagnosis not present

## 2018-03-14 DIAGNOSIS — Z4802 Encounter for removal of sutures: Secondary | ICD-10-CM

## 2018-03-14 NOTE — Progress Notes (Signed)
Patient arrived for nurse visit to remove 2 suture post- procedure of VATS 03/01/2018.  Suture removed with no signs/ symptoms of infection noted.  Patient tolerated procedure well.  Patient instructed to keep the incision sites clean and dry.  Patient acknowledged instructions given.

## 2018-03-14 NOTE — Telephone Encounter (Signed)
Scheduled appt per 4/17 los - left message with appt date and for patient to get an updated schedule next visit.

## 2018-03-15 ENCOUNTER — Ambulatory Visit
Admission: RE | Admit: 2018-03-15 | Discharge: 2018-03-15 | Disposition: A | Discharge status: Home / Self Care | Payer: Medicare Other | Source: Ambulatory Visit | Attending: Radiation Oncology | Admitting: Radiation Oncology

## 2018-03-15 DIAGNOSIS — C3411 Malignant neoplasm of upper lobe, right bronchus or lung: Secondary | ICD-10-CM | POA: Diagnosis not present

## 2018-03-15 DIAGNOSIS — Z510 Encounter for antineoplastic radiation therapy: Secondary | ICD-10-CM | POA: Diagnosis not present

## 2018-03-15 MED ORDER — SONAFINE EX EMUL
1.0000 "application " | Freq: Once | CUTANEOUS | Status: AC
  Administered 2018-03-15: 1 via TOPICAL

## 2018-03-15 NOTE — Progress Notes (Signed)
Pt here for patient teaching.  Pt given Radiation and You booklet, skin care instructions and Sonafine.  Reviewed areas of pertinence such as fatigue, hair loss, skin changes, throat changes and cough . Pt able to give teach back of to pat skin,apply Sonafine bid. Pt verbalizes understanding of information given and will contact nursing with any questions or concerns.     Cori Razor, RN

## 2018-03-17 ENCOUNTER — Other Ambulatory Visit: Payer: Self-pay | Admitting: Oncology

## 2018-03-18 ENCOUNTER — Ambulatory Visit
Admission: RE | Admit: 2018-03-18 | Discharge: 2018-03-18 | Disposition: A | Discharge status: Home / Self Care | Payer: Medicare Other | Source: Ambulatory Visit | Attending: Radiation Oncology | Admitting: Radiation Oncology

## 2018-03-18 DIAGNOSIS — C3411 Malignant neoplasm of upper lobe, right bronchus or lung: Secondary | ICD-10-CM | POA: Diagnosis not present

## 2018-03-18 DIAGNOSIS — Z510 Encounter for antineoplastic radiation therapy: Secondary | ICD-10-CM | POA: Diagnosis not present

## 2018-03-19 ENCOUNTER — Ambulatory Visit
Admission: RE | Admit: 2018-03-19 | Discharge: 2018-03-19 | Disposition: A | Discharge status: Home / Self Care | Payer: Medicare Other | Source: Ambulatory Visit | Attending: Radiation Oncology | Admitting: Radiation Oncology

## 2018-03-19 DIAGNOSIS — C3411 Malignant neoplasm of upper lobe, right bronchus or lung: Secondary | ICD-10-CM | POA: Diagnosis not present

## 2018-03-19 DIAGNOSIS — Z510 Encounter for antineoplastic radiation therapy: Secondary | ICD-10-CM | POA: Diagnosis not present

## 2018-03-20 ENCOUNTER — Inpatient Hospital Stay: Payer: Medicare Other

## 2018-03-20 ENCOUNTER — Ambulatory Visit
Admission: RE | Admit: 2018-03-20 | Discharge: 2018-03-20 | Disposition: A | Discharge status: Home / Self Care | Payer: Medicare Other | Source: Ambulatory Visit | Attending: Radiation Oncology | Admitting: Radiation Oncology

## 2018-03-20 VITALS — BP 126/58 | HR 83 | Temp 98.3°F | Resp 16

## 2018-03-20 DIAGNOSIS — C7A020 Malignant carcinoid tumor of the appendix: Secondary | ICD-10-CM | POA: Diagnosis not present

## 2018-03-20 DIAGNOSIS — C3411 Malignant neoplasm of upper lobe, right bronchus or lung: Secondary | ICD-10-CM

## 2018-03-20 DIAGNOSIS — J449 Chronic obstructive pulmonary disease, unspecified: Secondary | ICD-10-CM | POA: Diagnosis not present

## 2018-03-20 DIAGNOSIS — Z5111 Encounter for antineoplastic chemotherapy: Secondary | ICD-10-CM | POA: Diagnosis not present

## 2018-03-20 DIAGNOSIS — Z510 Encounter for antineoplastic radiation therapy: Secondary | ICD-10-CM | POA: Diagnosis not present

## 2018-03-20 DIAGNOSIS — C771 Secondary and unspecified malignant neoplasm of intrathoracic lymph nodes: Secondary | ICD-10-CM | POA: Diagnosis not present

## 2018-03-20 DIAGNOSIS — Z79899 Other long term (current) drug therapy: Secondary | ICD-10-CM | POA: Diagnosis not present

## 2018-03-20 LAB — CBC WITH DIFFERENTIAL (CANCER CENTER ONLY)
BASOS ABS: 0 10*3/uL (ref 0.0–0.1)
BASOS PCT: 1 %
EOS ABS: 0.1 10*3/uL (ref 0.0–0.5)
Eosinophils Relative: 2 %
HCT: 34.8 % (ref 34.8–46.6)
Hemoglobin: 11.2 g/dL — ABNORMAL LOW (ref 11.6–15.9)
Lymphocytes Relative: 10 %
Lymphs Abs: 0.4 10*3/uL — ABNORMAL LOW (ref 0.9–3.3)
MCH: 31.2 pg (ref 25.1–34.0)
MCHC: 32.2 g/dL (ref 31.5–36.0)
MCV: 96.8 fL (ref 79.5–101.0)
MONO ABS: 0.3 10*3/uL (ref 0.1–0.9)
Monocytes Relative: 7 %
NEUTROS ABS: 3.2 10*3/uL (ref 1.5–6.5)
Neutrophils Relative %: 80 %
Platelet Count: 209 10*3/uL (ref 145–400)
RBC: 3.59 MIL/uL — ABNORMAL LOW (ref 3.70–5.45)
RDW: 19.2 % — AB (ref 11.2–14.5)
WBC Count: 4 10*3/uL (ref 3.9–10.3)

## 2018-03-20 LAB — CMP (CANCER CENTER ONLY)
ALBUMIN: 3.4 g/dL — AB (ref 3.5–5.0)
ALK PHOS: 73 U/L (ref 40–150)
ALT: 10 U/L (ref 0–55)
ANION GAP: 10 (ref 3–11)
AST: 12 U/L (ref 5–34)
BILIRUBIN TOTAL: 0.4 mg/dL (ref 0.2–1.2)
BUN: 7 mg/dL (ref 7–26)
CALCIUM: 8.8 mg/dL (ref 8.4–10.4)
CO2: 27 mmol/L (ref 22–29)
Chloride: 106 mmol/L (ref 98–109)
Creatinine: 0.61 mg/dL (ref 0.60–1.10)
GFR, Est AFR Am: >60 mL/min (ref >60)
GFR, Estimated: >60 mL/min (ref >60)
GLUCOSE: 121 mg/dL (ref 70–140)
Potassium: 3.9 mmol/L (ref 3.5–5.1)
Sodium: 143 mmol/L (ref 136–145)
TOTAL PROTEIN: 6.4 g/dL (ref 6.4–8.3)

## 2018-03-20 MED ORDER — SODIUM CHLORIDE 0.9 % IV SOLN
Freq: Once | INTRAVENOUS | Status: AC
  Administered 2018-03-20: 13:00:00 via INTRAVENOUS

## 2018-03-20 MED ORDER — DEXAMETHASONE SODIUM PHOSPHATE 10 MG/ML IJ SOLN
INTRAMUSCULAR | Status: AC
  Filled 2018-03-20: qty 1

## 2018-03-20 MED ORDER — CARBOPLATIN CHEMO INJECTION 450 MG/45ML
146.2000 mg | Freq: Once | INTRAVENOUS | Status: AC
  Administered 2018-03-20: 150 mg via INTRAVENOUS
  Filled 2018-03-20: qty 15

## 2018-03-20 MED ORDER — PALONOSETRON HCL INJECTION 0.25 MG/5ML
0.2500 mg | Freq: Once | INTRAVENOUS | Status: AC
  Administered 2018-03-20: 0.25 mg via INTRAVENOUS

## 2018-03-20 MED ORDER — DIPHENHYDRAMINE HCL 50 MG/ML IJ SOLN
25.0000 mg | Freq: Once | INTRAMUSCULAR | Status: AC
  Administered 2018-03-20: 25 mg via INTRAVENOUS

## 2018-03-20 MED ORDER — PALONOSETRON HCL INJECTION 0.25 MG/5ML
INTRAVENOUS | Status: AC
  Filled 2018-03-20: qty 5

## 2018-03-20 MED ORDER — FAMOTIDINE IN NACL 20-0.9 MG/50ML-% IV SOLN
20.0000 mg | Freq: Once | INTRAVENOUS | Status: AC
  Administered 2018-03-20: 20 mg via INTRAVENOUS

## 2018-03-20 MED ORDER — FAMOTIDINE IN NACL 20-0.9 MG/50ML-% IV SOLN
INTRAVENOUS | Status: AC
  Filled 2018-03-20: qty 50

## 2018-03-20 MED ORDER — DIPHENHYDRAMINE HCL 50 MG/ML IJ SOLN
INTRAMUSCULAR | Status: AC
  Filled 2018-03-20: qty 1

## 2018-03-20 MED ORDER — PACLITAXEL CHEMO INJECTION 300 MG/50ML
45.0000 mg/m2 | Freq: Once | INTRAVENOUS | Status: AC
  Administered 2018-03-20: 72 mg via INTRAVENOUS
  Filled 2018-03-20: qty 12

## 2018-03-20 MED ORDER — DEXAMETHASONE SODIUM PHOSPHATE 10 MG/ML IJ SOLN
10.0000 mg | Freq: Once | INTRAMUSCULAR | Status: AC
  Administered 2018-03-20: 10 mg via INTRAVENOUS

## 2018-03-20 MED ORDER — DEXAMETHASONE SODIUM PHOSPHATE 100 MG/10ML IJ SOLN: 10.0000 mg | Freq: Once | INTRAMUSCULAR | Status: DC

## 2018-03-20 NOTE — Patient Instructions (Signed)
   [facility] Discharge Instructions for Patients Receiving Chemotherapy  Today you received the following chemotherapy agents Taxol and Carboplatin   To help prevent nausea and vomiting after your treatment, we encourage you to take your nausea medication as directed.    If you develop nausea and vomiting that is not controlled by your nausea medication, call the clinic.   BELOW ARE SYMPTOMS THAT SHOULD BE REPORTED IMMEDIATELY:  *FEVER GREATER THAN 100.5 F  *CHILLS WITH OR WITHOUT FEVER  NAUSEA AND VOMITING THAT IS NOT CONTROLLED WITH YOUR NAUSEA MEDICATION  *UNUSUAL SHORTNESS OF BREATH  *UNUSUAL BRUISING OR BLEEDING  TENDERNESS IN MOUTH AND THROAT WITH OR WITHOUT PRESENCE OF ULCERS  *URINARY PROBLEMS  *BOWEL PROBLEMS  UNUSUAL RASH Items with * indicate a potential emergency and should be followed up as soon as possible.  Feel free to call the clinic should you have any questions or concerns. The clinic phone number is [phone].  Please show the CHEMO ALERT CARD at check-in to the Emergency Department and triage nurse.   

## 2018-03-21 ENCOUNTER — Ambulatory Visit
Admission: RE | Admit: 2018-03-21 | Discharge: 2018-03-21 | Disposition: A | Discharge status: Home / Self Care | Payer: Medicare Other | Source: Ambulatory Visit | Attending: Radiation Oncology | Admitting: Radiation Oncology

## 2018-03-21 DIAGNOSIS — C3411 Malignant neoplasm of upper lobe, right bronchus or lung: Secondary | ICD-10-CM | POA: Diagnosis not present

## 2018-03-21 DIAGNOSIS — Z510 Encounter for antineoplastic radiation therapy: Secondary | ICD-10-CM | POA: Diagnosis not present

## 2018-03-22 ENCOUNTER — Ambulatory Visit
Admission: RE | Admit: 2018-03-22 | Discharge: 2018-03-22 | Disposition: A | Discharge status: Home / Self Care | Payer: Medicare Other | Source: Ambulatory Visit | Attending: Radiation Oncology | Admitting: Radiation Oncology

## 2018-03-22 DIAGNOSIS — J90 Pleural effusion, not elsewhere classified: Secondary | ICD-10-CM | POA: Diagnosis not present

## 2018-03-22 DIAGNOSIS — Z510 Encounter for antineoplastic radiation therapy: Secondary | ICD-10-CM | POA: Diagnosis not present

## 2018-03-22 DIAGNOSIS — Z6822 Body mass index (BMI) 22.0-22.9, adult: Secondary | ICD-10-CM | POA: Diagnosis not present

## 2018-03-22 DIAGNOSIS — C3411 Malignant neoplasm of upper lobe, right bronchus or lung: Secondary | ICD-10-CM | POA: Diagnosis not present

## 2018-03-22 DIAGNOSIS — J9621 Acute and chronic respiratory failure with hypoxia: Secondary | ICD-10-CM | POA: Diagnosis not present

## 2018-03-22 MED ORDER — SUCRALFATE 1 G PO TABS: 1.0000 g | ORAL_TABLET | Freq: Four times a day (QID) | ORAL | 2 refills | Status: DC

## 2018-03-23 ENCOUNTER — Other Ambulatory Visit: Payer: Self-pay | Admitting: Oncology

## 2018-03-24 NOTE — Progress Notes (Signed)
  Radiation Oncology         [phone] ________________________________  Name: PAYAL STANFORTH MRN: [id number]  Date: 02/27/2018  DOB: 06-06-1946  RESPIRATORY MOTION MANAGEMENT SIMULATION  NARRATIVE:  In order to account for effect of respiratory motion on target structures and other organs in the planning and delivery of radiotherapy, this patient underwent respiratory motion management simulation.  To accomplish this, when the patient was brought to the CT simulation planning suite, 4D respiratoy motion management CT images were obtained.  The CT images were loaded into the planning software.  Then, using a variety of tools including Cine, MIP, and standard views, the target volume and planning target volumes (PTV) were delineated.  Avoidance structures were contoured.  Treatment planning then occurred.  Dose volume histograms were generated and reviewed for each of the requested structure.  The resulting plan was carefully reviewed and approved today.   ------------------------------------------------  Jodelle Gross, MD, PhD

## 2018-03-24 NOTE — Progress Notes (Signed)
  Radiation Oncology         [phone] ________________________________  Name: Melissa Golden MRN: [id number]  Date: 02/27/2018  DOB: [birth year]  SIMULATION AND TREATMENT PLANNING NOTE  DIAGNOSIS:     ICD-10-CM   1. Malignant neoplasm of right upper lobe of lung ([place]) C34.11      Site:  chest  NARRATIVE:  The patient was brought to the [facility].  Identity was confirmed.  All relevant records and images related to the planned course of therapy were reviewed.   Written consent to proceed with treatment was confirmed which was freely given after reviewing the details related to the planned course of therapy had been reviewed with the patient.  Then, the patient was set-up in a stable reproducible  supine position for radiation therapy.  CT images were obtained.  Surface markings were placed.    Medically necessary complex treatment device(s) for immobilization:  Vac-lock bag.   The CT images were loaded into the planning software.  Then the target and avoidance structures were contoured.  Treatment planning then occurred.  The radiation prescription was entered and confirmed.  A total of 4 complex treatment devices were fabricated which relate to the designed radiation treatment fields. Additional reduced fields will be used as necessary to improve the dose homogeneity of the plan. Each of these customized fields/ complex treatment devices will be used on a daily basis during the radiation course. I have requested : 3D Simulation  I have requested a DVH of the following structures: target volume, spinal cord, lungs, heart.   The patient will undergo daily image guidance to ensure accurate localization of the target, and adequate minimize dose to the normal surrounding structures in close proximity to the target.  PLAN:  The patient will receive 60 Gy in 30 fractions initially. The patient will then receive a 6 Gy boost for a final dose of 66 Gy.  Special treatment  procedure The patient will also receive concurrent chemotherapy during the treatment. The patient may therefore experience increased toxicity or side effects and the patient will be monitored for such problems. This may require extra lab work as necessary. This therefore constitutes a special treatment procedure.   ________________________________   Jodelle Gross, MD, PhD

## 2018-03-25 ENCOUNTER — Ambulatory Visit
Admission: RE | Admit: 2018-03-25 | Discharge: 2018-03-25 | Disposition: A | Discharge status: Home / Self Care | Payer: Medicare Other | Source: Ambulatory Visit | Attending: Radiation Oncology | Admitting: Radiation Oncology

## 2018-03-25 DIAGNOSIS — C3411 Malignant neoplasm of upper lobe, right bronchus or lung: Secondary | ICD-10-CM | POA: Diagnosis not present

## 2018-03-25 DIAGNOSIS — Z510 Encounter for antineoplastic radiation therapy: Secondary | ICD-10-CM | POA: Diagnosis not present

## 2018-03-26 ENCOUNTER — Other Ambulatory Visit: Payer: Self-pay | Admitting: Surgery

## 2018-03-26 ENCOUNTER — Ambulatory Visit
Admission: RE | Admit: 2018-03-26 | Discharge: 2018-03-26 | Disposition: A | Discharge status: Home / Self Care | Payer: Medicare Other | Source: Ambulatory Visit | Attending: Radiation Oncology | Admitting: Radiation Oncology

## 2018-03-26 DIAGNOSIS — Z510 Encounter for antineoplastic radiation therapy: Secondary | ICD-10-CM | POA: Diagnosis not present

## 2018-03-26 DIAGNOSIS — C3411 Malignant neoplasm of upper lobe, right bronchus or lung: Secondary | ICD-10-CM

## 2018-03-27 ENCOUNTER — Encounter: Payer: Self-pay | Admitting: Surgery

## 2018-03-27 ENCOUNTER — Encounter: Payer: Self-pay | Admitting: Nurse Practitioner

## 2018-03-27 ENCOUNTER — Ambulatory Visit
Admission: RE | Admit: 2018-03-27 | Discharge: 2018-03-27 | Disposition: A | Discharge status: Home / Self Care | Payer: Medicare Other | Source: Ambulatory Visit | Attending: Radiation Oncology | Admitting: Radiation Oncology

## 2018-03-27 ENCOUNTER — Ambulatory Visit (INDEPENDENT_AMBULATORY_CARE_PROVIDER_SITE_OTHER): Payer: Self-pay | Admitting: Surgery

## 2018-03-27 ENCOUNTER — Telehealth: Payer: Self-pay | Admitting: Oncology

## 2018-03-27 ENCOUNTER — Ambulatory Visit
Admission: RE | Admit: 2018-03-27 | Discharge: 2018-03-27 | Disposition: A | Discharge status: Home / Self Care | Payer: Medicare Other | Source: Ambulatory Visit | Attending: Surgery | Admitting: Surgery

## 2018-03-27 ENCOUNTER — Inpatient Hospital Stay: Payer: Medicare Other | Attending: Nurse Practitioner

## 2018-03-27 ENCOUNTER — Inpatient Hospital Stay: Payer: Medicare Other

## 2018-03-27 ENCOUNTER — Inpatient Hospital Stay (HOSPITAL_BASED_OUTPATIENT_CLINIC_OR_DEPARTMENT_OTHER): Payer: Medicare Other | Admitting: Nurse Practitioner

## 2018-03-27 ENCOUNTER — Other Ambulatory Visit: Payer: Self-pay

## 2018-03-27 VITALS — BP 131/76 | HR 86 | Temp 98.6°F | Resp 17 | Ht 62.0 in | Wt 120.1 lb

## 2018-03-27 VITALS — BP 140/75 | HR 120 | Resp 20 | Ht 62.0 in | Wt 119.0 lb

## 2018-03-27 DIAGNOSIS — J439 Emphysema, unspecified: Secondary | ICD-10-CM | POA: Diagnosis not present

## 2018-03-27 DIAGNOSIS — J90 Pleural effusion, not elsewhere classified: Secondary | ICD-10-CM

## 2018-03-27 DIAGNOSIS — Z510 Encounter for antineoplastic radiation therapy: Secondary | ICD-10-CM | POA: Diagnosis not present

## 2018-03-27 DIAGNOSIS — Z5111 Encounter for antineoplastic chemotherapy: Secondary | ICD-10-CM | POA: Diagnosis not present

## 2018-03-27 DIAGNOSIS — C3411 Malignant neoplasm of upper lobe, right bronchus or lung: Secondary | ICD-10-CM

## 2018-03-27 DIAGNOSIS — R1310 Dysphagia, unspecified: Secondary | ICD-10-CM | POA: Diagnosis not present

## 2018-03-27 LAB — CMP (CANCER CENTER ONLY)
ALT: 8 U/L (ref 0–55)
AST: 14 U/L (ref 5–34)
Albumin: 3.5 g/dL (ref 3.5–5.0)
Alkaline Phosphatase: 67 U/L (ref 40–150)
Anion gap: 7 (ref 3–11)
BILIRUBIN TOTAL: 0.3 mg/dL (ref 0.2–1.2)
BUN: 7 mg/dL (ref 7–26)
CO2: 28 mmol/L (ref 22–29)
CREATININE: 0.61 mg/dL (ref 0.60–1.10)
Calcium: 9 mg/dL (ref 8.4–10.4)
Chloride: 104 mmol/L (ref 98–109)
GFR, Est AFR Am: >60 mL/min (ref >60)
Glucose, Bld: 140 mg/dL (ref 70–140)
Potassium: 3.7 mmol/L (ref 3.5–5.1)
Sodium: 139 mmol/L (ref 136–145)
TOTAL PROTEIN: 6.3 g/dL — AB (ref 6.4–8.3)

## 2018-03-27 LAB — CBC WITH DIFFERENTIAL (CANCER CENTER ONLY)
BASOS ABS: 0 10*3/uL (ref 0.0–0.1)
Basophils Relative: 1 %
EOS ABS: 0 10*3/uL (ref 0.0–0.5)
EOS PCT: 1 %
HCT: 33.4 % — ABNORMAL LOW (ref 34.8–46.6)
Hemoglobin: 10.7 g/dL — ABNORMAL LOW (ref 11.6–15.9)
Lymphocytes Relative: 11 %
Lymphs Abs: 0.3 10*3/uL — ABNORMAL LOW (ref 0.9–3.3)
MCH: 31 pg (ref 25.1–34.0)
MCHC: 32.1 g/dL (ref 31.5–36.0)
MCV: 96.6 fL (ref 79.5–101.0)
Monocytes Absolute: 0.3 10*3/uL (ref 0.1–0.9)
Monocytes Relative: 10 %
Neutro Abs: 2 10*3/uL (ref 1.5–6.5)
Neutrophils Relative %: 77 %
PLATELETS: 143 10*3/uL — AB (ref 145–400)
RBC: 3.46 MIL/uL — ABNORMAL LOW (ref 3.70–5.45)
RDW: 19.3 % — ABNORMAL HIGH (ref 11.2–14.5)
WBC: 2.6 10*3/uL — AB (ref 3.9–10.3)

## 2018-03-27 MED ORDER — DEXAMETHASONE SODIUM PHOSPHATE 10 MG/ML IJ SOLN
INTRAMUSCULAR | Status: AC
  Filled 2018-03-27: qty 1

## 2018-03-27 MED ORDER — DEXAMETHASONE SODIUM PHOSPHATE 10 MG/ML IJ SOLN
10.0000 mg | Freq: Once | INTRAMUSCULAR | Status: AC
Start: 2018-03-27 — End: 2018-03-27
  Administered 2018-03-27: 10 mg via INTRAVENOUS

## 2018-03-27 MED ORDER — PALONOSETRON HCL INJECTION 0.25 MG/5ML
0.2500 mg | Freq: Once | INTRAVENOUS | Status: AC
Start: 2018-03-27 — End: 2018-03-27
  Administered 2018-03-27: 0.25 mg via INTRAVENOUS

## 2018-03-27 MED ORDER — FAMOTIDINE IN NACL 20-0.9 MG/50ML-% IV SOLN
20.0000 mg | Freq: Once | INTRAVENOUS | Status: AC
  Administered 2018-03-27: 20 mg via INTRAVENOUS

## 2018-03-27 MED ORDER — SODIUM CHLORIDE 0.9 % IV SOLN
146.2000 mg | Freq: Once | INTRAVENOUS | Status: AC
  Administered 2018-03-27: 150 mg via INTRAVENOUS
  Filled 2018-03-27: qty 15

## 2018-03-27 MED ORDER — SODIUM CHLORIDE 0.9 % IV SOLN: 10.0000 mg | Freq: Once | INTRAVENOUS | Status: DC

## 2018-03-27 MED ORDER — DIPHENHYDRAMINE HCL 50 MG/ML IJ SOLN
25.0000 mg | Freq: Once | INTRAMUSCULAR | Status: AC
  Administered 2018-03-27: 25 mg via INTRAVENOUS

## 2018-03-27 MED ORDER — SODIUM CHLORIDE 0.9 % IV SOLN
Freq: Once | INTRAVENOUS | Status: AC
  Administered 2018-03-27: 16:00:00 via INTRAVENOUS

## 2018-03-27 MED ORDER — SODIUM CHLORIDE 0.9% FLUSH
10.0000 mL | INTRAVENOUS | Status: DC | PRN
  Filled 2018-03-27: qty 10

## 2018-03-27 MED ORDER — HEPARIN SOD (PORK) LOCK FLUSH 100 UNIT/ML IV SOLN
500.0000 [IU] | Freq: Once | INTRAVENOUS | Status: DC | PRN
  Filled 2018-03-27: qty 5

## 2018-03-27 MED ORDER — DIPHENHYDRAMINE HCL 50 MG/ML IJ SOLN
INTRAMUSCULAR | Status: AC
  Filled 2018-03-27: qty 1

## 2018-03-27 MED ORDER — SODIUM CHLORIDE 0.9 % IV SOLN
45.0000 mg/m2 | Freq: Once | INTRAVENOUS | Status: AC
  Administered 2018-03-27: 72 mg via INTRAVENOUS
  Filled 2018-03-27: qty 12

## 2018-03-27 MED ORDER — PALONOSETRON HCL INJECTION 0.25 MG/5ML
INTRAVENOUS | Status: AC
  Filled 2018-03-27: qty 5

## 2018-03-27 MED ORDER — FAMOTIDINE IN NACL 20-0.9 MG/50ML-% IV SOLN
INTRAVENOUS | Status: AC
  Filled 2018-03-27: qty 50

## 2018-03-27 NOTE — Telephone Encounter (Signed)
Scheduled appt per 5/1 los - Gave patient aVS and calender per los.

## 2018-03-27 NOTE — Progress Notes (Signed)
[facility] OFFICE PROGRESS NOTE   Diagnosis: Non-small cell lung cancer  INTERVAL HISTORY:   Melissa Golden returns as scheduled.  She continues radiation.  She completed cycle 2 weekly Taxol/carboplatin 03/20/2018.  She denies nausea/vomiting.  No mouth sores.  No diarrhea.  No numbness or tingling in her hands or feet.  Stable baseline dyspnea.  No signs of allergic reaction with the chemotherapy.  She notes pain with swallowing.  She is able to tolerate liquids and soft solids.  She tried Carafate on 2 occasions but vomited after each dose.  Objective:  Vital signs in last 24 hours:  Blood pressure 131/76, pulse 86, temperature 98.6 F (37 C), temperature source Oral, resp. rate 17, height _0  (1.575 m), weight 120 lb 1.6 oz (54.5 kg), SpO2 94 %.    HEENT: No thrush or ulcers. Resp: Distant breath sounds.  No respiratory distress. Cardio: Regular rate and rhythm. GI: Abdomen soft and nontender.  No hepatomegaly. Vascular: No leg edema.   Lab Results:  Lab Results  Component Value Date   WBC 2.6 (L) 03/27/2018   HGB 10.7 (L) 03/27/2018   HCT 33.4 (L) 03/27/2018   MCV 96.6 03/27/2018   PLT 143 (L) 03/27/2018   NEUTROABS 2.0 03/27/2018    Imaging:  Dg Chest 2 View  Result Date: 03/27/2018 CLINICAL DATA:  Prior VATS on 03/01/2018. Shortness of breath. Right upper lobe lung cancer EXAM: CHEST - 2 VIEW COMPARISON:  03/05/2018 FINDINGS: Stable mild lingular scarring or atelectasis. Prior airway thickening appears improved. Reduced size of the small right pleural effusion and associated passive atelectasis. Emphysema. Heart size within normal limits. IMPRESSION: 1. Reduced size of the right pleural effusion, which is currently small. 2. Stable scarring in the lingula. 3.  Emphysema (ICD10-J43.9). Electronically Signed   By: Van Clines M.D.   On: 03/27/2018 09:38    Medications: I have reviewed the patient's current  medications.  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  Status postlaparoscopic appendectomy 09/12/2017. Postoperative course complicated by an ileus requiring TPN.   Final pathology of the appendix-5 cm adenocarcinoma/goblet cell carcinoid. The tumor extended through the muscularis propria into subserosal soft tissue and mesoappendix and focally to the serosal surface. Lymphovascular space invasion identified. Perineural invasion identified. Associated acute appendicitis with perforation.  10/15/2017 CEA 1.8  11/22/2017 chromogranin A-3  Status postlaparoscopic right hemicolectomy by Dr. Janace Litten 12/07/2017. No carcinomatosis or liver metastases were seen.   Final pathologyshowed metastatic adenocarcinoma/goblet cell carcinoid in 3 of 15 pericolic lymph nodes; maximum size of largest metastasis 0.2 cm; positive for extracapsular extension. Benign terminal ileum with fibrous serosal adhesions, negative for dysplasia or carcinoma; surgically absent appendix; appendectomy site with postsurgical changes and fibrous serosal adhesions, negative for carcinoma; proximal and distal mucosal margins negative for dysplasia or carcinoma; mesenteric resection margin negative for carcinoma.  Cycle 1 adjuvant Xeloda 01/07/2018  Cycle 2 adjuvant Xeloda 01/28/2018 2. Non-small cell lung cancer  PET [id number] hypermetabolic right upper lobe pulmonary nodules increased in size and metabolic activity. No mediastinal nodal metastasis. 4 mm right upper lobe nodule with metabolic activity, SUV 3.6. A third right upper lobe nodule measures 4 mm and has faint metabolic activity. No clear evidence of malignancy in the abdomen or pelvis. Mild activity along the ventral abdominal surgical scar. Scattered activity in small  bowel without focal lesion on CT.  CT biopsy  right upper lobe nodule 11/02/2017 showed non-small cell carcinoma; malignant cells positive for TTF-1 and negative for p63. PD-L1 95%.  Chest CT 01/21/2018-new mediastinal adenopathy seen in the prevascular and right paratracheal stations. Largest lymph node is seen in the prevascular space measuring 2.0 cm, previously 6 mm. No hilar or axillary adenopathy. Right lung nodules are stable.  Biopsy of a right paratracheal lymph node on 02/04/2018-metastatic adenocarcinoma consistent with a lung primary  Chest radiation started 03/11/2018  Week 1 Taxol/carboplatin 03/13/2018  Week 2 Taxol/carboplatin 03/20/2018  Week 3 Taxol/carboplatin 03/27/2018 3. COPD  4.   VATS drainage of a loculated right pleural effusion 03/01/2018     Disposition: Melissa Golden appears stable.  She has completed 2 cycles of weekly Taxol/carboplatin.  She seems to be tolerating treatment well.  Plan to proceed with cycle 3 weekly today as scheduled.  She has odynophagia likely related to the radiation.  At present she is tolerating liquids and soft solids.  She understands to contact the office if she is unable to maintain adequate hydration orally.  She will return for week for Taxol/carboplatin 04/03/2018.  We will see her in follow-up on 04/10/2018.  She will contact the office in the interim as outlined above or with any other problems.    Ned Card ANP/GNP-BC   03/27/2018  2:19 PM

## 2018-03-27 NOTE — Patient Instructions (Signed)
   [facility] Discharge Instructions for Patients Receiving Chemotherapy  Today you received the following chemotherapy agents Taxol and Carboplatin   To help prevent nausea and vomiting after your treatment, we encourage you to take your nausea medication as directed.    If you develop nausea and vomiting that is not controlled by your nausea medication, call the clinic.   BELOW ARE SYMPTOMS THAT SHOULD BE REPORTED IMMEDIATELY:  *FEVER GREATER THAN 100.5 F  *CHILLS WITH OR WITHOUT FEVER  NAUSEA AND VOMITING THAT IS NOT CONTROLLED WITH YOUR NAUSEA MEDICATION  *UNUSUAL SHORTNESS OF BREATH  *UNUSUAL BRUISING OR BLEEDING  TENDERNESS IN MOUTH AND THROAT WITH OR WITHOUT PRESENCE OF ULCERS  *URINARY PROBLEMS  *BOWEL PROBLEMS  UNUSUAL RASH Items with * indicate a potential emergency and should be followed up as soon as possible.  Feel free to call the clinic should you have any questions or concerns. The clinic phone number is [phone].  Please show the CHEMO ALERT CARD at check-in to the Emergency Department and triage nurse.   

## 2018-03-27 NOTE — Progress Notes (Signed)
HPI: The patient returns today for follow-up after right mini thoracotomy with drainage of a loculated right pleural effusion that developed after chest tube drainage of a right hemothorax following mediastinoscopy.  She is feeling much better and is starting chemotherapy and radiation therapy for her metastatic lung cancer.  She said that she is only using oxygen at night now and her breathing seems like is back at baseline.  Her main complaint is a sore throat related to radiation therapy.  She is not able to eat much solid food but is taking supplements.  She says she was put on Carafate but that caused nausea and vomiting and she stopped it.  She is now using cough drops to keep her throat moist.  Current Outpatient Medications  Medication Sig Dispense Refill  . acetaminophen (TYLENOL) 500 MG tablet Take 2 tablets (1,000 mg total) by mouth every 6 (six) hours. 30 tablet 0  . albuterol (PROVENTIL HFA;VENTOLIN HFA) 108 (90 BASE) MCG/ACT inhaler Inhale 2 puffs into the lungs 3 (three) times daily. Shortness of Breath     . budesonide (PULMICORT) 180 MCG/ACT inhaler Inhale 2 puffs into the lungs 2 (two) times daily.    Marland Kitchen docusate sodium (COLACE) 100 MG capsule Take 1 capsule (100 mg total) by mouth 2 (two) times daily. 10 capsule 0  . ibuprofen (ADVIL,MOTRIN) 200 MG tablet Take 400 mg by mouth every 6 (six) hours as needed for moderate pain. Pain     . Multiple Vitamin (MULTIVITAMIN WITH MINERALS) TABS Take 1 tablet by mouth daily.    Marland Kitchen oxyCODONE (OXY IR/ROXICODONE) 5 MG immediate release tablet Take 1 tablet (5 mg total) by mouth every 6 (six) hours as needed for severe pain. 15 tablet 0  . prochlorperazine (COMPAZINE) 5 MG tablet Take 1 tablet (5 mg total) by mouth every 6 (six) hours as needed for nausea or vomiting. 30 tablet 0   No current facility-administered medications for this visit.      Physical Exam: BP 140/75   Pulse (!) 120   Resp 20   Ht 5\' 2"  (1.575 m)   Wt 119 lb (54  kg)   SpO2 93% Comment: RA  BMI 21.77 kg/m  She looks well.  She is in good spirits. Cardiac exam shows a regular rate and rhythm with normal heart sounds. Lung exam reveals slight decreased breath sounds at the right base. The right chest incisions are well-healed.  Diagnostic Tests:  CLINICAL DATA:  Prior VATS on 03/01/2018. Shortness of breath. Right upper lobe lung cancer  EXAM: CHEST - 2 VIEW  COMPARISON:  03/05/2018  FINDINGS: Stable mild lingular scarring or atelectasis. Prior airway thickening appears improved. Reduced size of the small right pleural effusion and associated passive atelectasis. Emphysema. Heart size within normal limits.  IMPRESSION: 1. Reduced size of the right pleural effusion, which is currently small. 2. Stable scarring in the lingula. 3.  Emphysema (ICD10-J43.9).   Electronically Signed   By: Van Clines M.D.   On: 03/27/2018 09:38  Impression:  There is been continued resolution of the residual right pleural effusion and is very small at this point time.  There is good aeration of both lungs.  I do not think she will have any more problems with this.  I reviewed the chest x-ray with her and her husband and answered their questions.  Plan:  She will continue to follow-up with Dr. Benay Spice and Dr. Lisbeth Renshaw who will complete her cancer treatment and continue surveillance.  I will be happy to see her back if the need arises.   Gaye Pollack, MD [facility] [phone]

## 2018-03-28 ENCOUNTER — Ambulatory Visit
Admission: RE | Admit: 2018-03-28 | Discharge: 2018-03-28 | Disposition: A | Discharge status: Home / Self Care | Payer: Medicare Other | Source: Ambulatory Visit | Attending: Radiation Oncology | Admitting: Radiation Oncology

## 2018-03-28 DIAGNOSIS — Z510 Encounter for antineoplastic radiation therapy: Secondary | ICD-10-CM | POA: Diagnosis not present

## 2018-03-28 DIAGNOSIS — C3411 Malignant neoplasm of upper lobe, right bronchus or lung: Secondary | ICD-10-CM | POA: Diagnosis not present

## 2018-03-29 ENCOUNTER — Ambulatory Visit
Admission: RE | Admit: 2018-03-29 | Discharge: 2018-03-29 | Disposition: A | Discharge status: Home / Self Care | Payer: Medicare Other | Source: Ambulatory Visit | Attending: Radiation Oncology | Admitting: Radiation Oncology

## 2018-03-29 DIAGNOSIS — Z510 Encounter for antineoplastic radiation therapy: Secondary | ICD-10-CM | POA: Diagnosis not present

## 2018-03-29 DIAGNOSIS — C3411 Malignant neoplasm of upper lobe, right bronchus or lung: Secondary | ICD-10-CM | POA: Diagnosis not present

## 2018-03-30 ENCOUNTER — Other Ambulatory Visit: Payer: Self-pay | Admitting: Oncology

## 2018-04-01 ENCOUNTER — Ambulatory Visit
Admission: RE | Admit: 2018-04-01 | Discharge: 2018-04-01 | Disposition: A | Discharge status: Home / Self Care | Payer: Medicare Other | Source: Ambulatory Visit | Attending: Radiation Oncology | Admitting: Radiation Oncology

## 2018-04-01 DIAGNOSIS — Z510 Encounter for antineoplastic radiation therapy: Secondary | ICD-10-CM | POA: Diagnosis not present

## 2018-04-01 DIAGNOSIS — C3411 Malignant neoplasm of upper lobe, right bronchus or lung: Secondary | ICD-10-CM | POA: Diagnosis not present

## 2018-04-02 ENCOUNTER — Ambulatory Visit
Admission: RE | Admit: 2018-04-02 | Discharge: 2018-04-02 | Disposition: A | Discharge status: Home / Self Care | Payer: Medicare Other | Source: Ambulatory Visit | Attending: Radiation Oncology | Admitting: Radiation Oncology

## 2018-04-02 DIAGNOSIS — C3411 Malignant neoplasm of upper lobe, right bronchus or lung: Secondary | ICD-10-CM | POA: Diagnosis not present

## 2018-04-02 DIAGNOSIS — Z510 Encounter for antineoplastic radiation therapy: Secondary | ICD-10-CM | POA: Diagnosis not present

## 2018-04-03 ENCOUNTER — Inpatient Hospital Stay: Payer: Medicare Other

## 2018-04-03 ENCOUNTER — Ambulatory Visit
Admission: RE | Admit: 2018-04-03 | Discharge: 2018-04-03 | Disposition: A | Discharge status: Home / Self Care | Payer: Medicare Other | Source: Ambulatory Visit | Attending: Radiation Oncology | Admitting: Radiation Oncology

## 2018-04-03 VITALS — BP 139/90 | HR 95 | Temp 97.9°F | Resp 18

## 2018-04-03 DIAGNOSIS — Z510 Encounter for antineoplastic radiation therapy: Secondary | ICD-10-CM | POA: Diagnosis not present

## 2018-04-03 DIAGNOSIS — C3411 Malignant neoplasm of upper lobe, right bronchus or lung: Secondary | ICD-10-CM

## 2018-04-03 DIAGNOSIS — Z5111 Encounter for antineoplastic chemotherapy: Secondary | ICD-10-CM | POA: Diagnosis not present

## 2018-04-03 DIAGNOSIS — R1310 Dysphagia, unspecified: Secondary | ICD-10-CM | POA: Diagnosis not present

## 2018-04-03 LAB — CBC WITH DIFFERENTIAL (CANCER CENTER ONLY)
BASOS ABS: 0 10*3/uL (ref 0.0–0.1)
BASOS PCT: 1 %
Eosinophils Absolute: 0 10*3/uL (ref 0.0–0.5)
Eosinophils Relative: 0 %
HEMATOCRIT: 38.3 % (ref 34.8–46.6)
Hemoglobin: 12.2 g/dL (ref 11.6–15.9)
LYMPHS PCT: 10 %
Lymphs Abs: 0.3 10*3/uL — ABNORMAL LOW (ref 0.9–3.3)
MCH: 31.1 pg (ref 25.1–34.0)
MCHC: 31.9 g/dL (ref 31.5–36.0)
MCV: 97.5 fL (ref 79.5–101.0)
Monocytes Absolute: 0.3 10*3/uL (ref 0.1–0.9)
Monocytes Relative: 12 %
NEUTROS ABS: 2.2 10*3/uL (ref 1.5–6.5)
NEUTROS PCT: 77 %
Platelet Count: 144 10*3/uL — ABNORMAL LOW (ref 145–400)
RBC: 3.93 MIL/uL (ref 3.70–5.45)
RDW: 19.5 % — AB (ref 11.2–14.5)
WBC: 2.8 10*3/uL — AB (ref 3.9–10.3)

## 2018-04-03 MED ORDER — DEXAMETHASONE SODIUM PHOSPHATE 10 MG/ML IJ SOLN
10.0000 mg | Freq: Once | INTRAMUSCULAR | Status: AC
  Administered 2018-04-03: 10 mg via INTRAVENOUS

## 2018-04-03 MED ORDER — PALONOSETRON HCL INJECTION 0.25 MG/5ML
0.2500 mg | Freq: Once | INTRAVENOUS | Status: AC
  Administered 2018-04-03: 0.25 mg via INTRAVENOUS

## 2018-04-03 MED ORDER — PALONOSETRON HCL INJECTION 0.25 MG/5ML
INTRAVENOUS | Status: AC
  Filled 2018-04-03: qty 5

## 2018-04-03 MED ORDER — SODIUM CHLORIDE 0.9 % IV SOLN
45.0000 mg/m2 | Freq: Once | INTRAVENOUS | Status: AC
  Administered 2018-04-03: 72 mg via INTRAVENOUS
  Filled 2018-04-03: qty 12

## 2018-04-03 MED ORDER — DIPHENHYDRAMINE HCL 50 MG/ML IJ SOLN
INTRAMUSCULAR | Status: AC
  Filled 2018-04-03: qty 1

## 2018-04-03 MED ORDER — FAMOTIDINE IN NACL 20-0.9 MG/50ML-% IV SOLN
20.0000 mg | Freq: Once | INTRAVENOUS | Status: AC
  Administered 2018-04-03: 20 mg via INTRAVENOUS

## 2018-04-03 MED ORDER — DIPHENHYDRAMINE HCL 50 MG/ML IJ SOLN
25.0000 mg | Freq: Once | INTRAMUSCULAR | Status: AC
  Administered 2018-04-03: 25 mg via INTRAVENOUS

## 2018-04-03 MED ORDER — SODIUM CHLORIDE 0.9 % IV SOLN
146.2000 mg | Freq: Once | INTRAVENOUS | Status: AC
  Administered 2018-04-03: 150 mg via INTRAVENOUS
  Filled 2018-04-03: qty 15

## 2018-04-03 MED ORDER — SODIUM CHLORIDE 0.9 % IV SOLN
Freq: Once | INTRAVENOUS | Status: AC
  Administered 2018-04-03: 12:00:00 via INTRAVENOUS

## 2018-04-03 MED ORDER — FAMOTIDINE IN NACL 20-0.9 MG/50ML-% IV SOLN
INTRAVENOUS | Status: AC
  Filled 2018-04-03: qty 50

## 2018-04-03 MED ORDER — DEXAMETHASONE SODIUM PHOSPHATE 10 MG/ML IJ SOLN
INTRAMUSCULAR | Status: AC
  Filled 2018-04-03: qty 1

## 2018-04-03 NOTE — Progress Notes (Signed)
Ok to treat with CMET from 03/27/18 per Dr. Benay Spice. Charge RN made aware.

## 2018-04-03 NOTE — Progress Notes (Signed)
Ok to treat with CBC from today and CMP from 5/1 per MD Benay Spice  During carboplatin infusion pt reported feeling wet on her L forearm around IV site.  Fluid found on pt's skin and underneath tegaderm around IV site.  Pt denies pain.  No redness, swelling, coolness, phlebitis, or any other skin issues seen or reported.  Assessed IV set, fluid appeared to be coming from connection between IV catheter and IV tubing.  Skin washed with soap and water and new dressing applied.  Attempted to restart but now IV would not drip to gravity.  Blood return noted but IV still not dripping.  Pt again denies pain/skin symptoms.  IV removed.  Skin washed again and gauze applied to site.  Site assessed as well by Lobbyist and RN Myrtie Neither.  New IV access gained in LAC above site and carboplatin restarted.  Black pen used to mark circle around where original IV site was.  Pt and husband advised to call if there is any pain or changes at the site, verbalized understanding.  At this time pt denies pain or any skin symptoms.  No changes in skin at IV site visible.

## 2018-04-03 NOTE — Patient Instructions (Signed)
[facility] Discharge Instructions for Patients Receiving Chemotherapy  Today you received the following chemotherapy agents: Taxol & Carboplatin   To help prevent nausea and vomiting after your treatment, we encourage you to take your nausea medication as directed.   If you develop nausea and vomiting that is not controlled by your nausea medication, call the clinic.   BELOW ARE SYMPTOMS THAT SHOULD BE REPORTED IMMEDIATELY:  *FEVER GREATER THAN 100.5 F  *CHILLS WITH OR WITHOUT FEVER  NAUSEA AND VOMITING THAT IS NOT CONTROLLED WITH YOUR NAUSEA MEDICATION  *UNUSUAL SHORTNESS OF BREATH  *UNUSUAL BRUISING OR BLEEDING  TENDERNESS IN MOUTH AND THROAT WITH OR WITHOUT PRESENCE OF ULCERS  *URINARY PROBLEMS  *BOWEL PROBLEMS  UNUSUAL RASH Items with * indicate a potential emergency and should be followed up as soon as possible.  Feel free to call the clinic you have any questions or concerns. The clinic phone number is [phone].

## 2018-04-04 ENCOUNTER — Ambulatory Visit
Admission: RE | Admit: 2018-04-04 | Discharge: 2018-04-04 | Disposition: A | Discharge status: Home / Self Care | Payer: Medicare Other | Source: Ambulatory Visit | Attending: Radiation Oncology | Admitting: Radiation Oncology

## 2018-04-04 DIAGNOSIS — C3411 Malignant neoplasm of upper lobe, right bronchus or lung: Secondary | ICD-10-CM | POA: Diagnosis not present

## 2018-04-04 DIAGNOSIS — Z510 Encounter for antineoplastic radiation therapy: Secondary | ICD-10-CM | POA: Diagnosis not present

## 2018-04-05 ENCOUNTER — Ambulatory Visit
Admission: RE | Admit: 2018-04-05 | Discharge: 2018-04-05 | Disposition: A | Discharge status: Home / Self Care | Payer: Medicare Other | Source: Ambulatory Visit | Attending: Radiation Oncology | Admitting: Radiation Oncology

## 2018-04-05 DIAGNOSIS — C3411 Malignant neoplasm of upper lobe, right bronchus or lung: Secondary | ICD-10-CM | POA: Diagnosis not present

## 2018-04-05 DIAGNOSIS — Z510 Encounter for antineoplastic radiation therapy: Secondary | ICD-10-CM | POA: Diagnosis not present

## 2018-04-07 ENCOUNTER — Other Ambulatory Visit: Payer: Self-pay | Admitting: Oncology

## 2018-04-08 ENCOUNTER — Ambulatory Visit
Admission: RE | Admit: 2018-04-08 | Discharge: 2018-04-08 | Disposition: A | Discharge status: Home / Self Care | Payer: Medicare Other | Source: Ambulatory Visit | Attending: Radiation Oncology | Admitting: Radiation Oncology

## 2018-04-08 DIAGNOSIS — Z510 Encounter for antineoplastic radiation therapy: Secondary | ICD-10-CM | POA: Diagnosis not present

## 2018-04-08 DIAGNOSIS — C3411 Malignant neoplasm of upper lobe, right bronchus or lung: Secondary | ICD-10-CM | POA: Diagnosis not present

## 2018-04-09 ENCOUNTER — Other Ambulatory Visit: Payer: Self-pay | Admitting: *Deleted

## 2018-04-09 ENCOUNTER — Ambulatory Visit
Admission: RE | Admit: 2018-04-09 | Discharge: 2018-04-09 | Disposition: A | Discharge status: Home / Self Care | Payer: Medicare Other | Source: Ambulatory Visit | Attending: Radiation Oncology | Admitting: Radiation Oncology

## 2018-04-09 DIAGNOSIS — C3411 Malignant neoplasm of upper lobe, right bronchus or lung: Secondary | ICD-10-CM

## 2018-04-09 DIAGNOSIS — Z510 Encounter for antineoplastic radiation therapy: Secondary | ICD-10-CM | POA: Diagnosis not present

## 2018-04-10 ENCOUNTER — Inpatient Hospital Stay (HOSPITAL_BASED_OUTPATIENT_CLINIC_OR_DEPARTMENT_OTHER): Payer: Medicare Other | Admitting: Oncology

## 2018-04-10 ENCOUNTER — Inpatient Hospital Stay: Payer: Medicare Other

## 2018-04-10 ENCOUNTER — Ambulatory Visit
Admission: RE | Admit: 2018-04-10 | Discharge: 2018-04-10 | Disposition: A | Discharge status: Home / Self Care | Payer: Medicare Other | Source: Ambulatory Visit | Attending: Radiation Oncology | Admitting: Radiation Oncology

## 2018-04-10 ENCOUNTER — Telehealth: Payer: Self-pay | Admitting: Oncology

## 2018-04-10 VITALS — BP 131/75 | HR 83 | Temp 98.4°F | Resp 17 | Ht 62.0 in | Wt 117.9 lb

## 2018-04-10 DIAGNOSIS — C3411 Malignant neoplasm of upper lobe, right bronchus or lung: Secondary | ICD-10-CM

## 2018-04-10 DIAGNOSIS — Z510 Encounter for antineoplastic radiation therapy: Secondary | ICD-10-CM | POA: Diagnosis not present

## 2018-04-10 DIAGNOSIS — R1310 Dysphagia, unspecified: Secondary | ICD-10-CM | POA: Diagnosis not present

## 2018-04-10 DIAGNOSIS — Z5111 Encounter for antineoplastic chemotherapy: Secondary | ICD-10-CM | POA: Diagnosis not present

## 2018-04-10 LAB — CBC WITH DIFFERENTIAL (CANCER CENTER ONLY)
Basophils Absolute: 0 10*3/uL (ref 0.0–0.1)
Basophils Relative: 0 %
Eosinophils Absolute: 0 10*3/uL (ref 0.0–0.5)
Eosinophils Relative: 0 %
HEMATOCRIT: 37.7 % (ref 34.8–46.6)
Hemoglobin: 12.1 g/dL (ref 11.6–15.9)
LYMPHS PCT: 15 %
Lymphs Abs: 0.4 10*3/uL — ABNORMAL LOW (ref 0.9–3.3)
MCH: 31.3 pg (ref 25.1–34.0)
MCHC: 32.1 g/dL (ref 31.5–36.0)
MCV: 97.4 fL (ref 79.5–101.0)
MONO ABS: 0.2 10*3/uL (ref 0.1–0.9)
MONOS PCT: 9 %
NEUTROS ABS: 1.8 10*3/uL (ref 1.5–6.5)
Neutrophils Relative %: 76 %
Platelet Count: 114 10*3/uL — ABNORMAL LOW (ref 145–400)
RBC: 3.87 MIL/uL (ref 3.70–5.45)
RDW: 16.9 % — AB (ref 11.2–14.5)
WBC Count: 2.4 10*3/uL — ABNORMAL LOW (ref 3.9–10.3)

## 2018-04-10 LAB — CMP (CANCER CENTER ONLY)
ALT: 15 U/L (ref 0–55)
AST: 17 U/L (ref 5–34)
Albumin: 3.8 g/dL (ref 3.5–5.0)
Alkaline Phosphatase: 69 U/L (ref 40–150)
Anion gap: 8 (ref 3–11)
BILIRUBIN TOTAL: 0.5 mg/dL (ref 0.2–1.2)
BUN: 9 mg/dL (ref 7–26)
CO2: 27 mmol/L (ref 22–29)
CREATININE: 0.59 mg/dL — AB (ref 0.60–1.10)
Calcium: 9.2 mg/dL (ref 8.4–10.4)
Chloride: 106 mmol/L (ref 98–109)
GFR, Est AFR Am: >60 mL/min (ref >60)
Glucose, Bld: 89 mg/dL (ref 70–140)
Potassium: 4 mmol/L (ref 3.5–5.1)
Sodium: 141 mmol/L (ref 136–145)
Total Protein: 6.6 g/dL (ref 6.4–8.3)

## 2018-04-10 MED ORDER — SODIUM CHLORIDE 0.9 % IV SOLN
45.0000 mg/m2 | Freq: Once | INTRAVENOUS | Status: AC
  Administered 2018-04-10: 72 mg via INTRAVENOUS
  Filled 2018-04-10: qty 12

## 2018-04-10 MED ORDER — DIPHENHYDRAMINE HCL 50 MG/ML IJ SOLN
25.0000 mg | Freq: Once | INTRAMUSCULAR | Status: AC
  Administered 2018-04-10: 25 mg via INTRAVENOUS

## 2018-04-10 MED ORDER — SODIUM CHLORIDE 0.9 % IV SOLN
Freq: Once | INTRAVENOUS | Status: AC
  Administered 2018-04-10: 12:00:00 via INTRAVENOUS

## 2018-04-10 MED ORDER — PALONOSETRON HCL INJECTION 0.25 MG/5ML
INTRAVENOUS | Status: AC
  Filled 2018-04-10: qty 5

## 2018-04-10 MED ORDER — DIPHENHYDRAMINE HCL 50 MG/ML IJ SOLN
INTRAMUSCULAR | Status: AC
  Filled 2018-04-10: qty 1

## 2018-04-10 MED ORDER — DEXAMETHASONE SODIUM PHOSPHATE 10 MG/ML IJ SOLN
INTRAMUSCULAR | Status: AC
  Filled 2018-04-10: qty 1

## 2018-04-10 MED ORDER — PALONOSETRON HCL INJECTION 0.25 MG/5ML
0.2500 mg | Freq: Once | INTRAVENOUS | Status: AC
  Administered 2018-04-10: 0.25 mg via INTRAVENOUS

## 2018-04-10 MED ORDER — SODIUM CHLORIDE 0.9 % IV SOLN
150.0000 mg | Freq: Once | INTRAVENOUS | Status: AC
  Administered 2018-04-10: 150 mg via INTRAVENOUS
  Filled 2018-04-10: qty 15

## 2018-04-10 MED ORDER — DEXAMETHASONE SODIUM PHOSPHATE 10 MG/ML IJ SOLN
10.0000 mg | Freq: Once | INTRAMUSCULAR | Status: AC
  Administered 2018-04-10: 10 mg via INTRAVENOUS

## 2018-04-10 MED ORDER — FAMOTIDINE IN NACL 20-0.9 MG/50ML-% IV SOLN
20.0000 mg | Freq: Once | INTRAVENOUS | Status: AC
  Administered 2018-04-10: 20 mg via INTRAVENOUS

## 2018-04-10 MED ORDER — FAMOTIDINE IN NACL 20-0.9 MG/50ML-% IV SOLN
INTRAVENOUS | Status: AC
  Filled 2018-04-10: qty 50

## 2018-04-10 NOTE — Telephone Encounter (Signed)
Unable to schedule appt due to capped day - logged - will contact pt when appt added.

## 2018-04-10 NOTE — Patient Instructions (Signed)
[facility] Discharge Instructions for Patients Receiving Chemotherapy  Today you received the following chemotherapy agents: Taxol & Carboplatin.   To help prevent nausea and vomiting after your treatment, we encourage you to take your nausea medication as directed.   If you develop nausea and vomiting that is not controlled by your nausea medication, call the clinic.   BELOW ARE SYMPTOMS THAT SHOULD BE REPORTED IMMEDIATELY:  *FEVER GREATER THAN 100.5 F  *CHILLS WITH OR WITHOUT FEVER  NAUSEA AND VOMITING THAT IS NOT CONTROLLED WITH YOUR NAUSEA MEDICATION  *UNUSUAL SHORTNESS OF BREATH  *UNUSUAL BRUISING OR BLEEDING  TENDERNESS IN MOUTH AND THROAT WITH OR WITHOUT PRESENCE OF ULCERS  *URINARY PROBLEMS  *BOWEL PROBLEMS  UNUSUAL RASH Items with * indicate a potential emergency and should be followed up as soon as possible.  Feel free to call the clinic you have any questions or concerns. The clinic phone number is [phone].

## 2018-04-10 NOTE — Progress Notes (Signed)
[facility] OFFICE PROGRESS NOTE   Diagnosis: Non-small cell lung cancer  INTERVAL HISTORY:   Ms. Brandner returns as scheduled.  She continues daily radiation.  She has completed 4 weeks of Taxol/carboplatin, last given on 04/03/2018.  No symptom or an allergic reaction.  No neuropathy symptoms.  She complains of a diet aphasia.  This is relieved with oxycodone.  Objective:  Vital signs in last 24 hours:  Blood pressure 131/75, pulse 83, temperature 98.4 F (36.9 C), temperature source Oral, resp. rate 17, height _0  (1.575 m), weight 117 lb 14.4 oz (53.5 kg), SpO2 97 %.    HEENT: No thrush or ulcers Resp: Distant breath sounds, no respiratory distress Cardio: Regular rate and rhythm GI: No hepatomegaly, nontender Vascular: No leg edema  Skin: Mild radiation erythema over the chest and back   Lab Results:  Lab Results  Component Value Date   WBC 2.4 (L) 04/10/2018   HGB 12.1 04/10/2018   HCT 37.7 04/10/2018   MCV 97.4 04/10/2018   PLT 114 (L) 04/10/2018   NEUTROABS 1.8 04/10/2018    CMP     Component Value Date/Time   NA 141 04/10/2018 0952   K 4.0 04/10/2018 0952   CL 106 04/10/2018 0952   CO2 27 04/10/2018 0952   GLUCOSE 89 04/10/2018 0952   BUN 9 04/10/2018 0952   CREATININE 0.59 (L) 04/10/2018 0952   CALCIUM 9.2 04/10/2018 0952   PROT 6.6 04/10/2018 0952   ALBUMIN 3.8 04/10/2018 0952   AST 17 04/10/2018 0952   ALT 15 04/10/2018 0952   ALKPHOS 69 04/10/2018 0952   BILITOT 0.5 04/10/2018 0952   GFRNONAA >60 04/10/2018 0952   GFRAA >60 04/10/2018 0952    Medications: I have reviewed the patient's current medications.   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  Status postlaparoscopic appendectomy 09/12/2017. Postoperative course complicated by an ileus requiring TPN.    Final pathology of the appendix-5 cm adenocarcinoma/goblet cell carcinoid. The tumor extended through the muscularis propria into subserosal soft tissue and mesoappendix and focally to the serosal surface. Lymphovascular space invasion identified. Perineural invasion identified. Associated acute appendicitis with perforation.  10/15/2017 CEA 1.8  11/22/2017 chromogranin A-3  Status postlaparoscopic right hemicolectomy by Dr. Janace Litten 12/07/2017. No carcinomatosis or liver metastases were seen.   Final pathologyshowed metastatic adenocarcinoma/goblet cell carcinoid in 3 of 15 pericolic lymph nodes; maximum size of largest metastasis 0.2 cm; positive for extracapsular extension. Benign terminal ileum with fibrous serosal adhesions, negative for dysplasia or carcinoma; surgically absent appendix; appendectomy site with postsurgical changes and fibrous serosal adhesions, negative for carcinoma; proximal and distal mucosal margins negative for dysplasia or carcinoma; mesenteric resection margin negative for carcinoma.  Cycle 1 adjuvant Xeloda 01/07/2018  Cycle 2 adjuvant Xeloda 01/28/2018 2. Non-small cell lung cancer  PET [id number]/7902-4 hypermetabolic right upper lobe pulmonary nodules increased in size and metabolic activity. No mediastinal nodal metastasis. 4 mm right upper lobe nodule with metabolic activity, SUV 3.6. A third right upper lobe nodule measures 4 mm and has faint metabolic activity. No clear evidence of malignancy in the abdomen or pelvis. Mild activity along the ventral abdominal surgical scar. Scattered activity in small bowel without focal lesion on CT.  CT biopsy right upper lobe nodule 11/02/2017 showed non-small cell carcinoma; malignant cells positive for TTF-1 and negative for p63. PD-L1 95%.  Chest CT 01/21/2018-new  mediastinal adenopathy seen in the prevascular and right paratracheal stations. Largest lymph node is seen in the prevascular  space measuring 2.0 cm, previously 6 mm. No hilar or axillary adenopathy. Right lung nodules are stable.  Biopsy of a right paratracheal lymph node on 02/04/2018-metastatic adenocarcinoma consistent with a lung primary  Chest radiation started 03/11/2018  Week 1 Taxol/carboplatin 03/13/2018  Week 2 Taxol/carboplatin 03/20/2018  Week 3 Taxol/carboplatin 03/27/2018  Week  4Taxol/carboplatin 04/03/2018  Week 5 Taxol/carboplatin 04/10/2018 3. COPD  4.VATS drainage of a loculated right pleural effusion 03/01/2018    Disposition: Ms. Rossmann appears to be tolerating the chemotherapy and radiation well.  She has developed odynophagia.  She will continue oxycodone as needed.  I encouraged her to increase nutrition supplements as tolerated.  She will complete another cycle of Taxol/carboplatin today.  She will return for week 6 chemotherapy on 04/17/2018.  We will see her for an office visit on 04/25/2018.  The plan is to schedule a restaging chest CT 2-3 weeks after the completion of radiation.  She will begin treatment with durvalumab if there is no evidence of disease progression.  Betsy Coder, MD  04/10/2018  11:06 AM

## 2018-04-11 ENCOUNTER — Ambulatory Visit
Admission: RE | Admit: 2018-04-11 | Discharge: 2018-04-11 | Disposition: A | Discharge status: Home / Self Care | Payer: Medicare Other | Source: Ambulatory Visit | Attending: Radiation Oncology | Admitting: Radiation Oncology

## 2018-04-11 DIAGNOSIS — Z510 Encounter for antineoplastic radiation therapy: Secondary | ICD-10-CM | POA: Diagnosis not present

## 2018-04-11 DIAGNOSIS — C3411 Malignant neoplasm of upper lobe, right bronchus or lung: Secondary | ICD-10-CM | POA: Diagnosis not present

## 2018-04-12 ENCOUNTER — Telehealth: Payer: Self-pay | Admitting: Oncology

## 2018-04-12 ENCOUNTER — Ambulatory Visit
Admission: RE | Admit: 2018-04-12 | Discharge: 2018-04-12 | Disposition: A | Discharge status: Home / Self Care | Payer: Medicare Other | Source: Ambulatory Visit | Attending: Radiation Oncology | Admitting: Radiation Oncology

## 2018-04-12 DIAGNOSIS — Z510 Encounter for antineoplastic radiation therapy: Secondary | ICD-10-CM | POA: Diagnosis not present

## 2018-04-12 DIAGNOSIS — C3411 Malignant neoplasm of upper lobe, right bronchus or lung: Secondary | ICD-10-CM | POA: Diagnosis not present

## 2018-04-12 NOTE — Telephone Encounter (Signed)
Scheduled appt per 5/15 los - left message for patient with appt date and time.

## 2018-04-15 ENCOUNTER — Ambulatory Visit
Admission: RE | Admit: 2018-04-15 | Discharge: 2018-04-15 | Disposition: A | Discharge status: Home / Self Care | Payer: Medicare Other | Source: Ambulatory Visit | Attending: Radiation Oncology | Admitting: Radiation Oncology

## 2018-04-15 ENCOUNTER — Ambulatory Visit: Payer: Medicare Other

## 2018-04-15 DIAGNOSIS — C3411 Malignant neoplasm of upper lobe, right bronchus or lung: Secondary | ICD-10-CM | POA: Diagnosis not present

## 2018-04-15 DIAGNOSIS — Z510 Encounter for antineoplastic radiation therapy: Secondary | ICD-10-CM | POA: Diagnosis not present

## 2018-04-16 ENCOUNTER — Ambulatory Visit
Admission: RE | Admit: 2018-04-16 | Discharge: 2018-04-16 | Disposition: A | Discharge status: Home / Self Care | Payer: Medicare Other | Source: Ambulatory Visit | Attending: Radiation Oncology | Admitting: Radiation Oncology

## 2018-04-16 ENCOUNTER — Ambulatory Visit: Payer: Medicare Other

## 2018-04-16 DIAGNOSIS — Z510 Encounter for antineoplastic radiation therapy: Secondary | ICD-10-CM | POA: Diagnosis not present

## 2018-04-16 DIAGNOSIS — C3411 Malignant neoplasm of upper lobe, right bronchus or lung: Secondary | ICD-10-CM | POA: Diagnosis not present

## 2018-04-17 ENCOUNTER — Ambulatory Visit: Payer: Medicare Other

## 2018-04-17 ENCOUNTER — Telehealth: Payer: Self-pay | Admitting: Nurse Practitioner

## 2018-04-17 ENCOUNTER — Ambulatory Visit
Admission: RE | Admit: 2018-04-17 | Discharge: 2018-04-17 | Disposition: A | Discharge status: Home / Self Care | Payer: Medicare Other | Source: Ambulatory Visit | Attending: Radiation Oncology | Admitting: Radiation Oncology

## 2018-04-17 ENCOUNTER — Other Ambulatory Visit: Payer: Self-pay | Admitting: Oncology

## 2018-04-17 DIAGNOSIS — Z510 Encounter for antineoplastic radiation therapy: Secondary | ICD-10-CM | POA: Diagnosis not present

## 2018-04-17 DIAGNOSIS — C3411 Malignant neoplasm of upper lobe, right bronchus or lung: Secondary | ICD-10-CM | POA: Diagnosis not present

## 2018-04-17 NOTE — Telephone Encounter (Signed)
Left message for patient regarding VM she left earlier about treatment day and time.

## 2018-04-18 ENCOUNTER — Ambulatory Visit: Payer: Medicare Other

## 2018-04-18 ENCOUNTER — Ambulatory Visit
Admission: RE | Admit: 2018-04-18 | Discharge: 2018-04-18 | Disposition: A | Discharge status: Home / Self Care | Payer: Medicare Other | Source: Ambulatory Visit | Attending: Radiation Oncology | Admitting: Radiation Oncology

## 2018-04-18 DIAGNOSIS — Z510 Encounter for antineoplastic radiation therapy: Secondary | ICD-10-CM | POA: Diagnosis not present

## 2018-04-18 DIAGNOSIS — C3411 Malignant neoplasm of upper lobe, right bronchus or lung: Secondary | ICD-10-CM | POA: Diagnosis not present

## 2018-04-19 ENCOUNTER — Ambulatory Visit: Payer: Medicare Other

## 2018-04-19 ENCOUNTER — Other Ambulatory Visit: Payer: Self-pay | Admitting: Oncology

## 2018-04-19 ENCOUNTER — Inpatient Hospital Stay: Payer: Medicare Other

## 2018-04-19 ENCOUNTER — Ambulatory Visit
Admission: RE | Admit: 2018-04-19 | Discharge: 2018-04-19 | Disposition: A | Discharge status: Home / Self Care | Payer: Medicare Other | Source: Ambulatory Visit | Attending: Radiation Oncology | Admitting: Radiation Oncology

## 2018-04-19 VITALS — BP 115/73 | HR 88 | Temp 99.2°F | Resp 17

## 2018-04-19 DIAGNOSIS — Z5111 Encounter for antineoplastic chemotherapy: Secondary | ICD-10-CM | POA: Diagnosis not present

## 2018-04-19 DIAGNOSIS — R1310 Dysphagia, unspecified: Secondary | ICD-10-CM | POA: Diagnosis not present

## 2018-04-19 DIAGNOSIS — C3411 Malignant neoplasm of upper lobe, right bronchus or lung: Secondary | ICD-10-CM | POA: Diagnosis not present

## 2018-04-19 DIAGNOSIS — Z510 Encounter for antineoplastic radiation therapy: Secondary | ICD-10-CM | POA: Diagnosis not present

## 2018-04-19 LAB — CBC WITH DIFFERENTIAL (CANCER CENTER ONLY)
BASOS ABS: 0 10*3/uL (ref 0.0–0.1)
BASOS PCT: 0 %
EOS PCT: 0 %
Eosinophils Absolute: 0 10*3/uL (ref 0.0–0.5)
HCT: 36.6 % (ref 34.8–46.6)
Hemoglobin: 12.1 g/dL (ref 11.6–15.9)
LYMPHS PCT: 9 %
Lymphs Abs: 0.2 10*3/uL — ABNORMAL LOW (ref 0.9–3.3)
MCH: 31.4 pg (ref 25.1–34.0)
MCHC: 33 g/dL (ref 31.5–36.0)
MCV: 95.3 fL (ref 79.5–101.0)
Monocytes Absolute: 0.2 10*3/uL (ref 0.1–0.9)
Monocytes Relative: 11 %
NEUTROS ABS: 1.8 10*3/uL (ref 1.5–6.5)
Neutrophils Relative %: 80 %
PLATELETS: 95 10*3/uL — AB (ref 145–400)
RBC: 3.84 MIL/uL (ref 3.70–5.45)
RDW: 19.3 % — ABNORMAL HIGH (ref 11.2–14.5)
WBC: 2.3 10*3/uL — AB (ref 3.9–10.3)

## 2018-04-19 MED ORDER — SODIUM CHLORIDE 0.9 % IV SOLN
146.2000 mg | Freq: Once | INTRAVENOUS | Status: AC
  Administered 2018-04-19: 150 mg via INTRAVENOUS
  Filled 2018-04-19: qty 15

## 2018-04-19 MED ORDER — PALONOSETRON HCL INJECTION 0.25 MG/5ML
0.2500 mg | Freq: Once | INTRAVENOUS | Status: AC
  Administered 2018-04-19: 0.25 mg via INTRAVENOUS

## 2018-04-19 MED ORDER — PACLITAXEL CHEMO INJECTION 300 MG/50ML
45.0000 mg/m2 | Freq: Once | INTRAVENOUS | Status: AC
  Administered 2018-04-19: 72 mg via INTRAVENOUS
  Filled 2018-04-19: qty 12

## 2018-04-19 MED ORDER — DIPHENHYDRAMINE HCL 50 MG/ML IJ SOLN
INTRAMUSCULAR | Status: AC
  Filled 2018-04-19: qty 1

## 2018-04-19 MED ORDER — SODIUM CHLORIDE 0.9 % IV SOLN
45.0000 mg/m2 | Freq: Once | INTRAVENOUS | Status: DC
  Filled 2018-04-19: qty 12

## 2018-04-19 MED ORDER — DIPHENHYDRAMINE HCL 50 MG/ML IJ SOLN
25.0000 mg | Freq: Once | INTRAMUSCULAR | Status: AC
  Administered 2018-04-19: 25 mg via INTRAVENOUS

## 2018-04-19 MED ORDER — PALONOSETRON HCL INJECTION 0.25 MG/5ML
INTRAVENOUS | Status: AC
  Filled 2018-04-19: qty 5

## 2018-04-19 MED ORDER — DEXAMETHASONE SODIUM PHOSPHATE 10 MG/ML IJ SOLN
10.0000 mg | Freq: Once | INTRAMUSCULAR | Status: AC
  Administered 2018-04-19: 10 mg via INTRAVENOUS

## 2018-04-19 MED ORDER — DEXAMETHASONE SODIUM PHOSPHATE 10 MG/ML IJ SOLN
INTRAMUSCULAR | Status: AC
  Filled 2018-04-19: qty 1

## 2018-04-19 MED ORDER — FAMOTIDINE IN NACL 20-0.9 MG/50ML-% IV SOLN
INTRAVENOUS | Status: AC
  Filled 2018-04-19: qty 50

## 2018-04-19 MED ORDER — FAMOTIDINE IN NACL 20-0.9 MG/50ML-% IV SOLN
20.0000 mg | Freq: Once | INTRAVENOUS | Status: AC
  Administered 2018-04-19: 20 mg via INTRAVENOUS

## 2018-04-19 MED ORDER — SODIUM CHLORIDE 0.9 % IV SOLN
Freq: Once | INTRAVENOUS | Status: AC
  Administered 2018-04-19: 16:00:00 via INTRAVENOUS

## 2018-04-19 NOTE — Progress Notes (Signed)
Ok to treat today with plts 98 and no CMP this visit per MD Benay Spice

## 2018-04-19 NOTE — Patient Instructions (Signed)
[facility] Discharge Instructions for Patients Receiving Chemotherapy  Today you received the following chemotherapy agents: Taxol & Carboplatin.   To help prevent nausea and vomiting after your treatment, we encourage you to take your nausea medication as directed.   If you develop nausea and vomiting that is not controlled by your nausea medication, call the clinic.   BELOW ARE SYMPTOMS THAT SHOULD BE REPORTED IMMEDIATELY:  *FEVER GREATER THAN 100.5 F  *CHILLS WITH OR WITHOUT FEVER  NAUSEA AND VOMITING THAT IS NOT CONTROLLED WITH YOUR NAUSEA MEDICATION  *UNUSUAL SHORTNESS OF BREATH  *UNUSUAL BRUISING OR BLEEDING  TENDERNESS IN MOUTH AND THROAT WITH OR WITHOUT PRESENCE OF ULCERS  *URINARY PROBLEMS  *BOWEL PROBLEMS  UNUSUAL RASH Items with * indicate a potential emergency and should be followed up as soon as possible.  Feel free to call the clinic you have any questions or concerns. The clinic phone number is [phone].

## 2018-04-22 ENCOUNTER — Ambulatory Visit: Payer: Medicare Other

## 2018-04-23 ENCOUNTER — Ambulatory Visit: Payer: Medicare Other

## 2018-04-23 ENCOUNTER — Ambulatory Visit
Admission: RE | Admit: 2018-04-23 | Discharge: 2018-04-23 | Disposition: A | Discharge status: Home / Self Care | Payer: Medicare Other | Source: Ambulatory Visit | Attending: Radiation Oncology | Admitting: Radiation Oncology

## 2018-04-23 DIAGNOSIS — Z510 Encounter for antineoplastic radiation therapy: Secondary | ICD-10-CM | POA: Diagnosis not present

## 2018-04-23 DIAGNOSIS — C3411 Malignant neoplasm of upper lobe, right bronchus or lung: Secondary | ICD-10-CM | POA: Diagnosis not present

## 2018-04-24 ENCOUNTER — Ambulatory Visit: Payer: Medicare Other

## 2018-04-24 ENCOUNTER — Ambulatory Visit
Admission: RE | Admit: 2018-04-24 | Discharge: 2018-04-24 | Disposition: A | Discharge status: Home / Self Care | Payer: Medicare Other | Source: Ambulatory Visit | Attending: Radiation Oncology | Admitting: Radiation Oncology

## 2018-04-24 ENCOUNTER — Ambulatory Visit: Payer: Medicare Other | Admitting: Surgery

## 2018-04-24 DIAGNOSIS — Z510 Encounter for antineoplastic radiation therapy: Secondary | ICD-10-CM | POA: Diagnosis not present

## 2018-04-24 DIAGNOSIS — C3411 Malignant neoplasm of upper lobe, right bronchus or lung: Secondary | ICD-10-CM | POA: Diagnosis not present

## 2018-04-24 NOTE — Progress Notes (Signed)
Nutrition  Patient identified on Malnutrition Screening report for poor appetite and weight loss.    Called patient to introduce self and service at the cancer center.  Patient reports appetite is good but has trouble swallowing some foods.  Has been trying to eat ice cream and softer foods.   Offered nutrition appointment, suggestions and patient reports that she is doing ok at this time and does not feel she needs this support currently.  Provided patient with contact information.  Patient appreciative of call.   Julen Rubert B. Zenia Resides, [place], [place] Registered Dietitian [phone] (pager)

## 2018-04-25 ENCOUNTER — Encounter: Payer: Self-pay | Admitting: Radiation Oncology

## 2018-04-25 ENCOUNTER — Encounter: Payer: Self-pay | Admitting: Nurse Practitioner

## 2018-04-25 ENCOUNTER — Inpatient Hospital Stay (HOSPITAL_BASED_OUTPATIENT_CLINIC_OR_DEPARTMENT_OTHER): Payer: Medicare Other | Admitting: Nurse Practitioner

## 2018-04-25 ENCOUNTER — Ambulatory Visit
Admission: RE | Admit: 2018-04-25 | Discharge: 2018-04-25 | Disposition: A | Discharge status: Home / Self Care | Payer: Medicare Other | Source: Ambulatory Visit | Attending: Radiation Oncology | Admitting: Radiation Oncology

## 2018-04-25 ENCOUNTER — Telehealth: Payer: Self-pay | Admitting: Oncology

## 2018-04-25 VITALS — BP 128/78 | HR 101 | Temp 98.7°F | Resp 18 | Ht 62.0 in | Wt 117.9 lb

## 2018-04-25 DIAGNOSIS — Z510 Encounter for antineoplastic radiation therapy: Secondary | ICD-10-CM | POA: Diagnosis not present

## 2018-04-25 DIAGNOSIS — C3411 Malignant neoplasm of upper lobe, right bronchus or lung: Secondary | ICD-10-CM | POA: Diagnosis not present

## 2018-04-25 DIAGNOSIS — Z5111 Encounter for antineoplastic chemotherapy: Secondary | ICD-10-CM | POA: Diagnosis not present

## 2018-04-25 DIAGNOSIS — R1310 Dysphagia, unspecified: Secondary | ICD-10-CM | POA: Diagnosis not present

## 2018-04-25 NOTE — Progress Notes (Signed)
DISCONTINUE ON PATHWAY REGIMEN - Non-Small Cell Lung     Administer weekly:     Paclitaxel      Carboplatin   **Always confirm dose/schedule in your pharmacy ordering system**    REASON: Continuation Of Treatment PRIOR TREATMENT: [id number]: Carboplatin AUC=2 + Paclitaxel 45 mg/m2 Weekly During Radiation TREATMENT RESPONSE: Unable to Evaluate  START ON PATHWAY REGIMEN - Non-Small Cell Lung     A cycle is every 14 days:     Durvalumab   **Always confirm dose/schedule in your pharmacy ordering system**    Patient Characteristics: Stage III - Unresectable, PS = 0, 1 AJCC T Category: Staged < 8th Ed. Current Disease Status: No Distant Mets or Local Recurrence AJCC N Category: Staged < 8th Ed. AJCC M Category: Staged < 8th Ed. AJCC 8 Stage Grouping: Staged < 8th Ed. Performance Status: PS = 0, 1 Intent of Therapy: Curative Intent, Discussed with Patient

## 2018-04-25 NOTE — Telephone Encounter (Signed)
Appointments scheduled AVS/Calendar printed per 5/30 los °

## 2018-04-25 NOTE — Progress Notes (Addendum)
[place] OFFICE PROGRESS NOTE   Diagnosis: Non-small cell lung cancer  INTERVAL HISTORY:   Melissa Golden returns as scheduled.  She completed cycle 6 weekly Taxol/carboplatin 04/19/2018.  She completed chest radiation today.  She overall is feeling well.  She denies shortness of breath.  No nausea or vomiting.  No mouth sores.  No diarrhea.  No numbness or tingling in the hands or feet.  Objective:  Vital signs in last 24 hours:  Blood pressure 128/78, pulse (!) 101, temperature 98.7 F (37.1 C), temperature source Oral, resp. rate 18, height 5' 2" (1.575 m), weight 117 lb 14.4 oz (53.5 kg), SpO2 96 %.    HEENT: No thrush or ulcers. Resp: Distant breath sounds. Cardio: Regular rate and rhythm. GI: Abdomen soft and nontender.  No hepatomegaly. Vascular: No leg edema.  Skin: Back with radiation hyperpigmentation with a small area of superficial desquamation centrally.   Lab Results:  Lab Results  Component Value Date   WBC 2.3 (L) 04/19/2018   HGB 12.1 04/19/2018   HCT 36.6 04/19/2018   MCV 95.3 04/19/2018   PLT 95 (L) 04/19/2018   NEUTROABS 1.8 04/19/2018    Imaging:  No results found.  Medications: I have reviewed the patient's current medications.  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  Status postlaparoscopic appendectomy 09/12/2017. Postoperative course complicated by an ileus requiring TPN.   Final pathology of the appendix-5 cm adenocarcinoma/goblet cell carcinoid. The tumor extended through the muscularis propria into subserosal soft tissue and mesoappendix and focally to the serosal surface. Lymphovascular space invasion identified. Perineural invasion identified. Associated acute appendicitis with perforation.  10/15/2017 CEA 1.  11/22/2017 chromogranin A-3  Status  postlaparoscopic right hemicolectomy by Dr. Janace Litten 12/07/2017. No carcinomatosis or liver metastases were seen.   Final pathologyshowed metastatic adenocarcinoma/goblet cell carcinoid in 3 of 15 pericolic lymph nodes; maximum size of largest metastasis 0.2 cm; positive for extracapsular extension. Benign terminal ileum with fibrous serosal adhesions, negative for dysplasia or carcinoma; surgically absent appendix; appendectomy site with postsurgical changes and fibrous serosal adhesions, negative for carcinoma; proximal and distal mucosal margins negative for dysplasia or carcinoma; mesenteric resection margin negative for carcinoma.  Cycle 1 adjuvant Xeloda 01/07/2018  Cycle 2 adjuvant Xeloda 01/28/2018 2. Non-small cell lung cancer  PET [id number]/88/9169-4 hypermetabolic right upper lobe pulmonary nodules increased in size and metabolic activity. No mediastinal nodal metastasis. 4 mm right upper lobe nodule with metabolic activity, SUV 3.6. A third right upper lobe nodule measures 4 mm and has faint metabolic activity. No clear evidence of malignancy in the abdomen or pelvis. Mild activity along the ventral abdominal surgical scar. Scattered activity in small bowel without focal lesion on CT.  CT biopsy right upper lobe nodule 11/02/2017 showed non-small cell carcinoma; malignant cells positive for TTF-1 and negative for p63. PD-L1 95%.  Chest CT 01/21/2018-new mediastinal adenopathy seen in the prevascular and right paratracheal stations. Largest lymph node is seen in the prevascular space measuring 2.0 cm, previously 6 mm. No hilar or axillary adenopathy. Right lung nodules are stable.  Biopsy of a right paratracheal lymph node on 02/04/2018-metastatic adenocarcinoma consistent with a lung primary  Chest radiation started 03/11/2018  Week 1 Taxol/carboplatin 03/13/2018  Week 2 Taxol/carboplatin 03/20/2018  Week 3 Taxol/carboplatin 03/27/2018  Week  4Taxol/carboplatin  04/03/2018  Week 5 Taxol/carboplatin 04/10/2018  Week 6 Taxol/carboplatin 04/19/2018  Chest radiation completed  04/25/2018 3. COPD  4.VATS drainage of a loculated right pleural effusion 03/01/2018   Disposition: Melissa Golden appears stable.  She has completed the planned course of chest radiation and weekly Taxol/carboplatin.  Dr. Benay Spice recommends a restaging CT scan of the chest in approximately 1 month.  If there is no evidence of disease progression she will begin immunotherapy with Imfinzi on a 2-week schedule for up to 1 year.  We reviewed potential toxicities including rash, diarrhea, pneumonitis, hepatitis, endocrinopathies.  She will return for a follow-up CBC in 1 week.  She will have a restaging CT scan of the chest in approximately 1 month and return for a follow-up visit 05/23/2018 to review the results and potentially begin Imfinzi.  Patient seen with Dr. Benay Spice.  Ned Card ANP/GNP-BC   04/25/2018  4:44 PM  This was a shared visit with Ned Card.  Melissa Golden has completed concurrent chemotherapy/radiation.  She will undergo a restaging CT in approximately 1 month.  If there is no evidence of disease progression we recommend treatment with durvalumab.  We reviewed tension toxicities associated with PD1 inhibitors.  She agrees to proceed.  Julieanne Manson, MD

## 2018-04-29 NOTE — Progress Notes (Addendum)
  Radiation Oncology         [id number]) [phone] ________________________________  Name: Melissa Golden MRN: [id number]  Date: 04/25/2018  DOB: 1946-04-28  End of Treatment Note  Diagnosis:  72 y.o. female with Multifocal Stage III NSCLC, adenocarcinoma of the RUL    Indication for treatment::  curative       Radiation treatment dates:   03/11/2018 - 04/25/2018  Site/dose:   The patient was treated to the disease within the RUL lung initially to a dose of 60 Gy using a 4 field, 3-D conformal technique. The patient then received a cone down boost treatment for an additional 6 Gy. This yielded a final total dose of 66 Gy.   Narrative: The patient tolerated radiation treatment relatively well.   The patient did experience esophagitis with symptoms of dysphasia and sore throat during the course of treatment which required management. She complained of nausea and vomiting after taking Carafate but was able to successfully manage her symptoms with OTC Sucrets. Additionally, she had some skin changes with hyperpigmentation, erythema, and dry desquamation to her chest and back. She is applying Sonafine cream to these areas.  Plan: The patient has completed radiation treatment. The patient will return to radiation oncology clinic for routine followup in one month. I advised the patient to call or return sooner if they have any questions or concerns related to their recovery or treatment. ________________________________  Jodelle Gross, MD, PhD  This document serves as a record of services personally performed by Kyung Rudd, MD. It was created on his behalf by Rae Lips, a trained medical scribe. The creation of this record is based on the scribe's personal observations and the provider's statements to them. This document has been checked and approved by the attending provider.

## 2018-05-02 ENCOUNTER — Inpatient Hospital Stay: Payer: Medicare Other | Attending: Nurse Practitioner

## 2018-05-02 DIAGNOSIS — Z923 Personal history of irradiation: Secondary | ICD-10-CM | POA: Insufficient documentation

## 2018-05-02 DIAGNOSIS — Z79899 Other long term (current) drug therapy: Secondary | ICD-10-CM | POA: Diagnosis not present

## 2018-05-02 DIAGNOSIS — K3521 Acute appendicitis with generalized peritonitis, with abscess: Secondary | ICD-10-CM | POA: Diagnosis not present

## 2018-05-02 DIAGNOSIS — Z5111 Encounter for antineoplastic chemotherapy: Secondary | ICD-10-CM | POA: Insufficient documentation

## 2018-05-02 DIAGNOSIS — J90 Pleural effusion, not elsewhere classified: Secondary | ICD-10-CM | POA: Insufficient documentation

## 2018-05-02 DIAGNOSIS — J449 Chronic obstructive pulmonary disease, unspecified: Secondary | ICD-10-CM | POA: Insufficient documentation

## 2018-05-02 DIAGNOSIS — C3411 Malignant neoplasm of upper lobe, right bronchus or lung: Secondary | ICD-10-CM

## 2018-05-02 LAB — CBC WITH DIFFERENTIAL (CANCER CENTER ONLY)
BASOS ABS: 0 10*3/uL (ref 0.0–0.1)
Basophils Relative: 1 %
EOS ABS: 0 10*3/uL (ref 0.0–0.5)
EOS PCT: 0 %
HCT: 37.1 % (ref 34.8–46.6)
Hemoglobin: 12.2 g/dL (ref 11.6–15.9)
LYMPHS PCT: 17 %
Lymphs Abs: 0.3 10*3/uL — ABNORMAL LOW (ref 0.9–3.3)
MCH: 31.4 pg (ref 25.1–34.0)
MCHC: 32.8 g/dL (ref 31.5–36.0)
MCV: 95.7 fL (ref 79.5–101.0)
MONO ABS: 0.3 10*3/uL (ref 0.1–0.9)
Monocytes Relative: 20 %
Neutro Abs: 1 10*3/uL — ABNORMAL LOW (ref 1.5–6.5)
Neutrophils Relative %: 62 %
PLATELETS: 111 10*3/uL — AB (ref 145–400)
RBC: 3.88 MIL/uL (ref 3.70–5.45)
RDW: 19.5 % — AB (ref 11.2–14.5)
WBC Count: 1.6 10*3/uL — ABNORMAL LOW (ref 3.9–10.3)

## 2018-05-02 LAB — CMP (CANCER CENTER ONLY)
ALBUMIN: 3.8 g/dL (ref 3.5–5.0)
ALK PHOS: 75 U/L (ref 40–150)
ALT: 8 U/L (ref 0–55)
AST: 17 U/L (ref 5–34)
Anion gap: 7 (ref 3–11)
BILIRUBIN TOTAL: 0.4 mg/dL (ref 0.2–1.2)
BUN: 7 mg/dL (ref 7–26)
CO2: 28 mmol/L (ref 22–29)
CREATININE: 0.59 mg/dL — AB (ref 0.60–1.10)
Calcium: 9 mg/dL (ref 8.4–10.4)
Chloride: 104 mmol/L (ref 98–109)
GFR, Est AFR Am: >60 mL/min (ref >60)
GLUCOSE: 89 mg/dL (ref 70–140)
Potassium: 4.2 mmol/L (ref 3.5–5.1)
Sodium: 139 mmol/L (ref 136–145)
TOTAL PROTEIN: 6.6 g/dL (ref 6.4–8.3)

## 2018-05-07 ENCOUNTER — Inpatient Hospital Stay: Admission: RE | Admit: 2018-05-07 | Payer: Self-pay | Source: Ambulatory Visit | Admitting: Radiation Oncology

## 2018-05-18 ENCOUNTER — Other Ambulatory Visit: Payer: Self-pay | Admitting: Oncology

## 2018-05-22 ENCOUNTER — Ambulatory Visit (HOSPITAL_COMMUNITY)
Admission: RE | Admit: 2018-05-22 | Discharge: 2018-05-22 | Disposition: A | Discharge status: Home / Self Care | Payer: Medicare Other | Source: Ambulatory Visit | Attending: Nurse Practitioner | Admitting: Nurse Practitioner

## 2018-05-22 ENCOUNTER — Other Ambulatory Visit: Payer: Self-pay | Admitting: *Deleted

## 2018-05-22 DIAGNOSIS — C3411 Malignant neoplasm of upper lobe, right bronchus or lung: Secondary | ICD-10-CM

## 2018-05-22 DIAGNOSIS — R590 Localized enlarged lymph nodes: Secondary | ICD-10-CM | POA: Insufficient documentation

## 2018-05-22 DIAGNOSIS — J438 Other emphysema: Secondary | ICD-10-CM | POA: Diagnosis not present

## 2018-05-22 DIAGNOSIS — J432 Centrilobular emphysema: Secondary | ICD-10-CM | POA: Insufficient documentation

## 2018-05-22 DIAGNOSIS — R918 Other nonspecific abnormal finding of lung field: Secondary | ICD-10-CM | POA: Insufficient documentation

## 2018-05-22 DIAGNOSIS — J90 Pleural effusion, not elsewhere classified: Secondary | ICD-10-CM | POA: Insufficient documentation

## 2018-05-22 DIAGNOSIS — I700 Atherosclerosis of aorta: Secondary | ICD-10-CM | POA: Diagnosis not present

## 2018-05-22 MED ORDER — IOHEXOL 300 MG/ML  SOLN
75.0000 mL | Freq: Once | INTRAMUSCULAR | Status: AC | PRN
  Administered 2018-05-22: 75 mL via INTRAVENOUS

## 2018-05-23 ENCOUNTER — Inpatient Hospital Stay: Payer: Medicare Other

## 2018-05-23 ENCOUNTER — Inpatient Hospital Stay (HOSPITAL_BASED_OUTPATIENT_CLINIC_OR_DEPARTMENT_OTHER): Payer: Medicare Other | Admitting: Oncology

## 2018-05-23 VITALS — BP 134/73 | HR 77 | Temp 98.6°F | Resp 17 | Ht 62.0 in | Wt 119.7 lb

## 2018-05-23 DIAGNOSIS — C3411 Malignant neoplasm of upper lobe, right bronchus or lung: Secondary | ICD-10-CM

## 2018-05-23 DIAGNOSIS — J449 Chronic obstructive pulmonary disease, unspecified: Secondary | ICD-10-CM | POA: Diagnosis not present

## 2018-05-23 DIAGNOSIS — Z79899 Other long term (current) drug therapy: Secondary | ICD-10-CM

## 2018-05-23 DIAGNOSIS — Z5111 Encounter for antineoplastic chemotherapy: Secondary | ICD-10-CM | POA: Diagnosis not present

## 2018-05-23 DIAGNOSIS — Z923 Personal history of irradiation: Secondary | ICD-10-CM | POA: Diagnosis not present

## 2018-05-23 DIAGNOSIS — J90 Pleural effusion, not elsewhere classified: Secondary | ICD-10-CM | POA: Diagnosis not present

## 2018-05-23 LAB — CMP (CANCER CENTER ONLY)
ALT: 12 U/L (ref 0–44)
ANION GAP: 8 (ref 5–15)
AST: 15 U/L (ref 15–41)
Albumin: 3.9 g/dL (ref 3.5–5.0)
Alkaline Phosphatase: 74 U/L (ref 38–126)
BUN: 6 mg/dL — ABNORMAL LOW (ref 8–23)
CO2: 27 mmol/L (ref 22–32)
Calcium: 9.2 mg/dL (ref 8.9–10.3)
Chloride: 106 mmol/L (ref 98–111)
Creatinine: 0.59 mg/dL (ref 0.44–1.00)
GFR, Est AFR Am: >60 mL/min (ref >60)
GFR, Estimated: >60 mL/min (ref >60)
GLUCOSE: 79 mg/dL (ref 70–99)
Potassium: 4.2 mmol/L (ref 3.5–5.1)
SODIUM: 141 mmol/L (ref 135–145)
TOTAL PROTEIN: 6.7 g/dL (ref 6.5–8.1)
Total Bilirubin: 0.4 mg/dL (ref 0.3–1.2)

## 2018-05-23 LAB — TSH: TSH: 1.627 u[IU]/mL (ref 0.308–3.960)

## 2018-05-23 LAB — CBC WITH DIFFERENTIAL (CANCER CENTER ONLY)
BASOS ABS: 0 10*3/uL (ref 0.0–0.1)
Basophils Relative: 1 %
EOS ABS: 0 10*3/uL (ref 0.0–0.5)
EOS PCT: 0 %
HCT: 39.1 % (ref 34.8–46.6)
HEMOGLOBIN: 12.9 g/dL (ref 11.6–15.9)
LYMPHS PCT: 21 %
Lymphs Abs: 0.8 10*3/uL — ABNORMAL LOW (ref 0.9–3.3)
MCH: 31.9 pg (ref 25.1–34.0)
MCHC: 33 g/dL (ref 31.5–36.0)
MCV: 96.7 fL (ref 79.5–101.0)
Monocytes Absolute: 0.6 10*3/uL (ref 0.1–0.9)
Monocytes Relative: 15 %
NEUTROS PCT: 63 %
Neutro Abs: 2.3 10*3/uL (ref 1.5–6.5)
Platelet Count: 142 10*3/uL — ABNORMAL LOW (ref 145–400)
RBC: 4.05 MIL/uL (ref 3.70–5.45)
RDW: 18.6 % — ABNORMAL HIGH (ref 11.2–14.5)
WBC: 3.6 10*3/uL — AB (ref 3.9–10.3)

## 2018-05-23 MED ORDER — SODIUM CHLORIDE 0.9 % IV SOLN
Freq: Once | INTRAVENOUS | Status: AC
  Administered 2018-05-23: 13:00:00 via INTRAVENOUS

## 2018-05-23 MED ORDER — SODIUM CHLORIDE 0.9 % IV SOLN
9.5000 mg/kg | Freq: Once | INTRAVENOUS | Status: AC
  Administered 2018-05-23: 500 mg via INTRAVENOUS
  Filled 2018-05-23: qty 10

## 2018-05-23 NOTE — Patient Instructions (Signed)
[facility] Discharge Instructions for Patients Receiving Chemotherapy  Today you received the following chemotherapy agents Imfinzi  To help prevent nausea and vomiting after your treatment, we encourage you to take your nausea medication as directed   If you develop nausea and vomiting that is not controlled by your nausea medication, call the clinic.   BELOW ARE SYMPTOMS THAT SHOULD BE REPORTED IMMEDIATELY:  *FEVER GREATER THAN 100.5 F  *CHILLS WITH OR WITHOUT FEVER  NAUSEA AND VOMITING THAT IS NOT CONTROLLED WITH YOUR NAUSEA MEDICATION  *UNUSUAL SHORTNESS OF BREATH  *UNUSUAL BRUISING OR BLEEDING  TENDERNESS IN MOUTH AND THROAT WITH OR WITHOUT PRESENCE OF ULCERS  *URINARY PROBLEMS  *BOWEL PROBLEMS  UNUSUAL RASH Items with * indicate a potential emergency and should be followed up as soon as possible.  Feel free to call the clinic should you have any questions or concerns. The clinic phone number is [phone].  Please show the [facility] at check-in to the Emergency Department and triage nurse.  Durvalumab (Imfinzi) injection What is this medicine? DURVALUMAB (dur VAL ue mab) is a monoclonal antibody. It is used to treat urothelial cancer. This medicine may be used for other purposes; ask your health care provider or pharmacist if you have questions. COMMON BRAND NAME(S): IMFINZI What should I tell my health care provider before I take this medicine? They need to know if you have any of these conditions: -diabetes -immune system problems -infection -inflammatory bowel disease -kidney disease -liver disease -lung or breathing disease -lupus -organ transplant -stomach or intestine problems -thyroid disease -an unusual or allergic reaction to durvalumab, other medicines, foods, dyes, or preservatives -pregnant or trying to get pregnant -breast-feeding How should I use this medicine? This medicine is for infusion into a vein. It is given  by a health care professional in a hospital or clinic setting. A special MedGuide will be given to you before each treatment. Be sure to read this information carefully each time. Talk to your pediatrician regarding the use of this medicine in children. Special care may be needed. Overdosage: If you think you have taken too much of this medicine contact a poison control center or emergency room at once. NOTE: This medicine is only for you. Do not share this medicine with others. What if I miss a dose? It is important not to miss your dose. Call your doctor or health care professional if you are unable to keep an appointment. What may interact with this medicine? Interactions have not been studied. This list may not describe all possible interactions. Give your health care provider a list of all the medicines, herbs, non-prescription drugs, or dietary supplements you use. Also tell them if you smoke, drink alcohol, or use illegal drugs. Some items may interact with your medicine. What should I watch for while using this medicine? This drug may make you feel generally unwell. Continue your course of treatment even though you feel ill unless your doctor tells you to stop. You may need blood work done while you are taking this medicine. Do not become pregnant while taking this medicine or for 3 months after stopping it. Women should inform their doctor if they wish to become pregnant or think they might be pregnant. There is a potential for serious side effects to an unborn child. Talk to your health care professional or pharmacist for more information. Do not breast-feed an infant while taking this medicine or for 3 months after stopping it. What side effects  may I notice from receiving this medicine? Side effects that you should report to your doctor or health care professional as soon as possible: -allergic reactions like skin rash, itching or hives, swelling of the face, lips, or tongue -black,  tarry stools -bloody or watery diarrhea -breathing problems -change in emotions or moods -change in sex drive -changes in vision -chest pain or chest tightness -chills -confusion -cough -facial flushing -fever -headache -signs and symptoms of high blood sugar such as dizziness; dry mouth; dry skin; fruity breath; nausea; stomach pain; increased hunger or thirst; increased urination -signs and symptoms of liver injury like dark yellow or brown urine; general ill feeling or flu-like symptoms; light-colored stools; loss of appetite; nausea; right upper belly pain; unusually weak or tired; yellowing of the eyes or skin -stomach pain -trouble passing urine or change in the amount of urine -weight gain or weight loss Side effects that usually do not require medical attention (report these to your doctor or health care professional if they continue or are bothersome): -bone pain -constipation -loss of appetite -muscle pain -nausea -swelling of the ankles, feet, hands -tiredness This list may not describe all possible side effects. Call your doctor for medical advice about side effects. You may report side effects to FDA at 1-800-FDA-1088. Where should I keep my medicine? This drug is given in a hospital or clinic and will not be stored at home. NOTE: This sheet is a summary. It may not cover all possible information. If you have questions about this medicine, talk to your doctor, pharmacist, or health care provider.  2018 Elsevier/Gold Standard (2016-06-16 15:50:36)

## 2018-05-23 NOTE — Progress Notes (Signed)
[facility] OFFICE PROGRESS NOTE   Diagnosis: Non-small cell lung cancer  INTERVAL HISTORY:   Melissa Golden returns as scheduled.  She feels well.  No specific complaint.  She is here to begin durvalumab  Objective:  Vital signs in last 24 hours:  Blood pressure 134/73, pulse 77, temperature 98.6 F (37 C), temperature source Oral, resp. rate 17, height _0  (1.575 m), weight 119 lb 11.2 oz (54.3 kg), SpO2 95 %.    HEENT: No thrush, 1 mm petechial lesion at the left buccal mucosa Lymphatics: No cervical or supraclavicular nodes.  1/2 cm soft mobile bilateral axillary nodes Resp: Lungs clear bilaterally Cardio: Regular rate and rhythm GI: No hepatomegaly Vascular: No leg edema   Lab Results:  Lab Results  Component Value Date   WBC 3.6 (L) 05/23/2018   HGB 12.9 05/23/2018   HCT 39.1 05/23/2018   MCV 96.7 05/23/2018   PLT 142 (L) 05/23/2018   NEUTROABS 2.3 05/23/2018    CMP  Lab Results  Component Value Date   NA 139 05/02/2018   K 4.2 05/02/2018   CL 104 05/02/2018   CO2 28 05/02/2018   GLUCOSE 89 05/02/2018   BUN 7 05/02/2018   CREATININE 0.59 (L) 05/02/2018   CALCIUM 9.0 05/02/2018   PROT 6.6 05/02/2018   ALBUMIN 3.8 05/02/2018   AST 17 05/02/2018   ALT 8 05/02/2018   ALKPHOS 75 05/02/2018   BILITOT 0.4 05/02/2018   GFRNONAA >60 05/02/2018   GFRAA >60 05/02/2018    Medications: I have reviewed the patient's current medications.   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  Status postlaparoscopic appendectomy 09/12/2017. Postoperative course complicated by an ileus requiring TPN.   Final pathology of the appendix-5 cm adenocarcinoma/goblet cell carcinoid. The tumor extended through the muscularis propria into subserosal soft tissue and mesoappendix and focally to the  serosal surface. Lymphovascular space invasion identified. Perineural invasion identified. Associated acute appendicitis with perforation.  10/15/2017 CEA 1.  11/22/2017 chromogranin A-3  Status postlaparoscopic right hemicolectomy by Dr. Janace Litten 12/07/2017. No carcinomatosis or liver metastases were seen.   Final pathologyshowed metastatic adenocarcinoma/goblet cell carcinoid in 3 of 15 pericolic lymph nodes; maximum size of largest metastasis 0.2 cm; positive for extracapsular extension. Benign terminal ileum with fibrous serosal adhesions, negative for dysplasia or carcinoma; surgically absent appendix; appendectomy site with postsurgical changes and fibrous serosal adhesions, negative for carcinoma; proximal and distal mucosal margins negative for dysplasia or carcinoma; mesenteric resection margin negative for carcinoma.  Cycle 1 adjuvant Xeloda 01/07/2018  Cycle 2 adjuvant Xeloda 01/28/2018 2. Non-small cell lung cancer  PET BEEF00/71/2197-5 hypermetabolic right upper lobe pulmonary nodules increased in size and metabolic activity. No mediastinal nodal metastasis. 4 mm right upper lobe nodule with metabolic activity, SUV 3.6. A third right upper lobe nodule measures 4 mm and has faint metabolic activity. No clear evidence of malignancy in the abdomen or pelvis. Mild activity along the ventral abdominal surgical scar. Scattered activity in small bowel without focal lesion on CT.  CT biopsy right upper lobe nodule 11/02/2017 showed non-small cell carcinoma; malignant cells positive for TTF-1 and negative for p63. PD-L1 95%.  Chest CT 01/21/2018-new mediastinal adenopathy seen in the prevascular and right paratracheal stations. Largest lymph node is seen in the prevascular space measuring 2.0 cm, previously 6 mm. No hilar or axillary adenopathy. Right lung nodules are stable.  Biopsy of a right paratracheal lymph node on 02/04/2018-metastatic adenocarcinoma  consistent with a lung primary  Chest radiation started 03/11/2018  Week 1 Taxol/carboplatin 03/13/2018  Week 2 Taxol/carboplatin 03/20/2018  Week 3 Taxol/carboplatin 03/27/2018  Week4Taxol/carboplatin 04/03/2018  Week 5 Taxol/carboplatin 04/10/2018  Week 6 Taxol/carboplatin 04/19/2018  Chest radiation completed 04/25/2018  Cycle 1 durvalumab 05/23/2018 3. COPD  4.VATS drainage of a loculated right pleural effusion 03/01/2018   Disposition: Melissa Golden peers well.  She underwent a restaging chest CT yesterday.  The final report is pending.  I reviewed the CT images with Melissa Golden and her husband.  The nodular lung lesions and mediastinal lymphadenopathy appear improved.  A right pleural effusion has resolved.  I recommend proceeding with durvalumab.  We reviewed potential toxicities associated with durvalumab.  She agrees to proceed.  She reports difficulty with peripheral IV access.  We discussed a Port-A-Cath placement.  She will consider Port-A-Cath placement prior to the next treatment.  She will return for an office visit and durvalumab in 2 weeks.  25 minutes were spent with the patient today.  The majority of the time was used for counseling and coordination of care.  Betsy Coder, MD  05/23/2018  11:20 AM

## 2018-05-24 ENCOUNTER — Telehealth: Payer: Self-pay | Admitting: *Deleted

## 2018-05-24 NOTE — Telephone Encounter (Signed)
-----   Message from Murvin Natal, RN sent at 05/23/2018  2:43 PM EDT ----- Regarding: pt of Dr. Benay Spice first time imfinzi follow up call Pt of Dr. Benay Spice first time Imfinzi follow up call.  Pt tolerated treatment well.

## 2018-05-24 NOTE — Telephone Encounter (Signed)
Follow up call made to patient. She states she feels great. No concerns at this time. She understands to call with any questions or concerns.

## 2018-05-27 DIAGNOSIS — J440 Chronic obstructive pulmonary disease with acute lower respiratory infection: Secondary | ICD-10-CM | POA: Diagnosis not present

## 2018-05-27 DIAGNOSIS — Z87891 Personal history of nicotine dependence: Secondary | ICD-10-CM | POA: Diagnosis not present

## 2018-05-27 DIAGNOSIS — C3411 Malignant neoplasm of upper lobe, right bronchus or lung: Secondary | ICD-10-CM | POA: Diagnosis not present

## 2018-05-27 DIAGNOSIS — N959 Unspecified menopausal and perimenopausal disorder: Secondary | ICD-10-CM | POA: Diagnosis not present

## 2018-05-27 DIAGNOSIS — F419 Anxiety disorder, unspecified: Secondary | ICD-10-CM | POA: Diagnosis not present

## 2018-05-27 DIAGNOSIS — M542 Cervicalgia: Secondary | ICD-10-CM | POA: Diagnosis not present

## 2018-05-29 ENCOUNTER — Other Ambulatory Visit: Payer: Self-pay | Admitting: Radiology

## 2018-05-31 ENCOUNTER — Other Ambulatory Visit: Payer: Self-pay | Admitting: Oncology

## 2018-05-31 ENCOUNTER — Ambulatory Visit (HOSPITAL_COMMUNITY)
Admission: RE | Admit: 2018-05-31 | Discharge: 2018-05-31 | Disposition: A | Discharge status: Home / Self Care | Payer: Medicare Other | Source: Ambulatory Visit | Attending: Oncology | Admitting: Oncology

## 2018-05-31 ENCOUNTER — Encounter (HOSPITAL_COMMUNITY): Payer: Self-pay

## 2018-05-31 DIAGNOSIS — C3411 Malignant neoplasm of upper lobe, right bronchus or lung: Secondary | ICD-10-CM

## 2018-05-31 DIAGNOSIS — C3490 Malignant neoplasm of unspecified part of unspecified bronchus or lung: Secondary | ICD-10-CM | POA: Diagnosis not present

## 2018-05-31 DIAGNOSIS — Z7951 Long term (current) use of inhaled steroids: Secondary | ICD-10-CM | POA: Insufficient documentation

## 2018-05-31 DIAGNOSIS — J449 Chronic obstructive pulmonary disease, unspecified: Secondary | ICD-10-CM | POA: Diagnosis not present

## 2018-05-31 DIAGNOSIS — Z5111 Encounter for antineoplastic chemotherapy: Secondary | ICD-10-CM | POA: Diagnosis not present

## 2018-05-31 HISTORY — PX: IR IMAGING GUIDED PORT INSERTION: IMG5740

## 2018-05-31 LAB — CBC WITH DIFFERENTIAL/PLATELET
Basophils Absolute: 0 10*3/uL (ref 0.0–0.1)
Basophils Relative: 1 %
Eosinophils Absolute: 0 10*3/uL (ref 0.0–0.7)
Eosinophils Relative: 1 %
HEMATOCRIT: 41.9 % (ref 36.0–46.0)
HEMOGLOBIN: 14 g/dL (ref 12.0–15.0)
LYMPHS PCT: 16 %
Lymphs Abs: 0.7 10*3/uL (ref 0.7–4.0)
MCH: 32.5 pg (ref 26.0–34.0)
MCHC: 33.4 g/dL (ref 30.0–36.0)
MCV: 97.2 fL (ref 78.0–100.0)
MONO ABS: 0.5 10*3/uL (ref 0.1–1.0)
Monocytes Relative: 11 %
NEUTROS ABS: 3.1 10*3/uL (ref 1.7–7.7)
Neutrophils Relative %: 71 %
Platelets: 147 10*3/uL — ABNORMAL LOW (ref 150–400)
RBC: 4.31 MIL/uL (ref 3.87–5.11)
RDW: 15.4 % (ref 11.5–15.5)
WBC: 4.3 10*3/uL (ref 4.0–10.5)

## 2018-05-31 LAB — PROTIME-INR
INR: 0.89
Prothrombin Time: 12 seconds (ref 11.4–15.2)

## 2018-05-31 MED ORDER — HEPARIN SOD (PORK) LOCK FLUSH 100 UNIT/ML IV SOLN
INTRAVENOUS | Status: AC | PRN
  Administered 2018-05-31: 500 [IU] via INTRAVENOUS

## 2018-05-31 MED ORDER — CEFAZOLIN SODIUM-DEXTROSE 2-4 GM/100ML-% IV SOLN
2.0000 g | INTRAVENOUS | Status: AC
  Administered 2018-05-31: 2 g via INTRAVENOUS

## 2018-05-31 MED ORDER — FENTANYL CITRATE (PF) 100 MCG/2ML IJ SOLN
INTRAMUSCULAR | Status: AC
  Filled 2018-05-31: qty 2

## 2018-05-31 MED ORDER — CEFAZOLIN SODIUM-DEXTROSE 2-4 GM/100ML-% IV SOLN
INTRAVENOUS | Status: AC
  Administered 2018-05-31: 2 g via INTRAVENOUS
  Filled 2018-05-31: qty 100

## 2018-05-31 MED ORDER — LIDOCAINE-EPINEPHRINE (PF) 1 %-1:200000 IJ SOLN
INTRAMUSCULAR | Status: AC | PRN
  Administered 2018-05-31: 20 mL

## 2018-05-31 MED ORDER — MIDAZOLAM HCL 2 MG/2ML IJ SOLN
INTRAMUSCULAR | Status: AC
  Filled 2018-05-31: qty 4

## 2018-05-31 MED ORDER — SODIUM CHLORIDE 0.9 % IV SOLN
INTRAVENOUS | Status: DC
  Administered 2018-05-31: 09:00:00 via INTRAVENOUS

## 2018-05-31 MED ORDER — HEPARIN SOD (PORK) LOCK FLUSH 100 UNIT/ML IV SOLN
INTRAVENOUS | Status: AC
  Filled 2018-05-31: qty 5

## 2018-05-31 MED ORDER — LIDOCAINE-EPINEPHRINE (PF) 2 %-1:200000 IJ SOLN
INTRAMUSCULAR | Status: AC
  Filled 2018-05-31: qty 20

## 2018-05-31 MED ORDER — LIDOCAINE-EPINEPHRINE (PF) 1 %-1:200000 IJ SOLN
INTRAMUSCULAR | Status: AC | PRN
  Administered 2018-05-31: 5 mL

## 2018-05-31 MED ORDER — IOPAMIDOL (ISOVUE-300) INJECTION 61%
INTRAVENOUS | Status: AC
  Filled 2018-05-31: qty 50

## 2018-05-31 MED ORDER — MIDAZOLAM HCL 2 MG/2ML IJ SOLN
INTRAMUSCULAR | Status: AC | PRN
  Administered 2018-05-31: 1 mg via INTRAVENOUS

## 2018-05-31 MED ORDER — FENTANYL CITRATE (PF) 100 MCG/2ML IJ SOLN
INTRAMUSCULAR | Status: AC | PRN
  Administered 2018-05-31: 50 ug via INTRAVENOUS

## 2018-05-31 NOTE — H&P (Signed)
Referring Physician(s): Ladell Pier  Supervising Physician: Sandi Mariscal  Patient Status:  [facility]  Chief Complaint: "I'm here for a port a cath"   Subjective: Patient familiar to IR service from prior right upper lobe lung nodule biopsy on 11/02/2017 as well as right thoracentesis on 02/19/2018 yielding 200 cc fluid.  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  Recent CT chest reveals decrease in size of mediastinal adenopathy with resolution of previously visualized right effusion, few right lung nodules with decrease in size and others which are stable.  She has poor venous access and presents again today for Port-A-Cath placement for planned immunotherapy. She currently denies fever, headache, chest pain, worsening dyspnea/cough, abdominal pain, back pain, nausea, vomiting or bleeding.  Past Medical History:  Diagnosis Date  . Asthma   . Cancer ([place]) 2017   colon   chemo tx  . COPD (chronic obstructive pulmonary disease) ([place])    wears 3L home O2  . Ileus following gastrointestinal surgery ([place]) 09/12/2017   required TPN support  . Pleural effusion, right   . Shortness of breath    with exertion   Past Surgical History:  Procedure Laterality Date  . ABDOMINAL HYSTERECTOMY    . CATARACT EXTRACTION W/ INTRAOCULAR LENS IMPLANT     Right  . CATARACT EXTRACTION W/PHACO  01/02/2013   Procedure: CATARACT EXTRACTION PHACO AND INTRAOCULAR LENS PLACEMENT (IOC);  Surgeon: Tonny Branch, MD;  Location: [facility];  Service: Ophthalmology;  Laterality: Left;  CDE: 16.19  . EYE SURGERY    . IR THORACENTESIS ASP PLEURAL SPACE W/IMG GUIDE  02/19/2018  . LAPAROSCOPIC APPENDECTOMY  09/12/2017  . laparoscopic hemicolectomy Right 12/07/2017   DR. Stitzenberg and [place]      . LYMPH NODE BIOPSY N/A 02/04/2018   Procedure: LYMPH NODE BIOPSY;  Surgeon: Gaye Pollack, MD;  Location: [place];  Service: Thoracic;  Laterality: N/A;  . MEDIASTINOSCOPY N/A 02/04/2018   Procedure: MEDIASTINOSCOPY;  Surgeon: Gaye Pollack, MD;  Location: [place];  Service: Thoracic;  Laterality: N/A;  . PLEURAL EFFUSION DRAINAGE Right 03/01/2018   Procedure: DRAINAGE OF PLEURAL EFFUSION;  Surgeon: Gaye Pollack, MD;  Location: [place];  Service: Thoracic;  Laterality: Right;  Marland Kitchen VIDEO ASSISTED THORACOSCOPY Right 03/01/2018   Procedure: VIDEO ASSISTED THORACOSCOPY;  Surgeon: Gaye Pollack, MD;  Location: [place];  Service: Thoracic;  Laterality: Right;      Allergies: Patient has no known allergies.  Medications: Prior to Admission medications   Medication Sig Start Date End Date Taking? Authorizing Provider  albuterol (PROVENTIL HFA;VENTOLIN HFA) 108 (90 BASE) MCG/ACT inhaler Inhale 2 puffs into the lungs 3 (three) times daily. Shortness of Breath    Yes [provider]  budesonide (PULMICORT) 180 MCG/ACT inhaler Inhale 2 puffs into the lungs 2 (two) times daily.   Yes [provider]  ibuprofen (ADVIL,MOTRIN) 200 MG tablet Take 400 mg by mouth every 6 (six) hours as needed for moderate pain. Pain    Yes [provider]  Multiple Vitamin (MULTIVITAMIN WITH MINERALS) TABS Take 1 tablet by mouth daily.   Yes [provider]  acetaminophen (TYLENOL) 500 MG tablet Take 2 tablets (1,000 mg total) by mouth every 6 (six) hours. 03/06/18   Elgie Collard, PA-C  docusate sodium (COLACE) 100 MG capsule Take 1 capsule (100 mg total) by mouth 2 (two) times daily. Patient not taking: Reported on 05/23/2018 02/20/18   Geradine Girt, DO  oxyCODONE (OXY IR/ROXICODONE) 5 MG immediate release tablet Take 1 tablet (5 mg total) by mouth every 6 (six) hours as needed for severe pain. Patient not taking: Reported on 05/23/2018 03/06/18   Elgie Collard, PA-C  prochlorperazine  (COMPAZINE) 5 MG tablet Take 1 tablet (5 mg total) by mouth every 6 (six) hours as needed for nausea or vomiting. Patient not taking: Reported on 05/23/2018 03/13/18   Ladell Pier, MD     Vital Signs: BP (!) 126/94 (BP Location: Right Arm)   Pulse 82   Temp 97.6 F (36.4 C) (Oral)   Resp 16   Ht 5\' 2"  (1.575 m)   Wt 117 lb (53.1 kg)   SpO2 98%   BMI 21.40 kg/m   Physical Exam awake, alert.  Chest with distant breath sounds bilaterally.  Heart with regular rate and rhythm.  Abdomen soft, positive bowel sounds, nontender.  No lower extremity edema.  Imaging: No results found.  Labs:  CBC: Recent Labs    04/19/18 1342 05/02/18 1036 05/23/18 1027 05/31/18 0903  WBC 2.3* 1.6* 3.6* 4.3  HGB 12.1 12.2 12.9 14.0  HCT 36.6 37.1 39.1 41.9  PLT 95* 111* 142* 147*    COAGS: Recent Labs    11/02/17 0930 02/01/18 1426 03/01/18 0641 05/31/18 0903  INR 0.95 0.97 1.11 0.89  APTT 30 30 36  --     BMP: Recent Labs    03/27/18 1125 04/10/18 0952 05/02/18 1036 05/23/18 1027  NA 139 141 139 141  K 3.7 4.0 4.2 4.2  CL 104 106 104 106  CO2 28 27 28 27   GLUCOSE 140 89 89 79  BUN 7 9 7  6*  CALCIUM 9.0 9.2 9.0 9.2  CREATININE 0.61 0.59* 0.59* 0.59  GFRNONAA >60 >60 >60 >60  GFRAA >60 >60 >60 >60    LIVER FUNCTION TESTS: Recent Labs    03/27/18 1125 04/10/18 0952 05/02/18 1036 05/23/18 1027  BILITOT 0.3 0.5 0.4 0.4  AST 14 17 17 15   ALT 8 15 8 12   ALKPHOS 67 69 75 74  PROT 6.3* 6.6 6.6 6.7  ALBUMIN 3.5 3.8 3.8 3.9    Assessment and Plan: Pt with 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  Recent CT chest reveals decrease in size of mediastinal adenopathy with resolution of previously visualized right effusion, few right lung nodules with decrease in size and others  which are stable.  She has poor venous access and presents again today for Port-A-Cath placement for planned immunotherapy.Risks and benefits of image guided port-a-catheter placement was discussed with the patient /spouse including, but not limited to bleeding, infection, pneumothorax, or fibrin sheath development and need for additional procedures.  All of the patient's questions were answered, patient is agreeable to proceed. Consent signed and in chart.    Electronically Signed: D. Rowe Robert, PA-C 05/31/2018, 10:29 AM   I spent a total of 25 minutes at the the patient's bedside AND on the patient's hospital floor or unit, greater than 50% of which was counseling/coordinating care for Port-A-Cath placement

## 2018-05-31 NOTE — Procedures (Signed)
Pre Procedure Dx: Metastatic lung cancer Post Procedural Dx: Same  Successful placement of right IJ approach port-a-cath with tip at the superior caval atrial junction. The catheter is ready for immediate use.  Estimated Blood Loss: Minimal  Complications: None immediate.  Ronny Bacon, MD Pager #: [phone]

## 2018-05-31 NOTE — Discharge Instructions (Signed)
Can take dressing off in 24 hours. Can shower in 24 hours  Implanted Procedure [facility] Guide An implanted port is a type of central line that is placed under the skin. Central lines are used to provide IV access when treatment or nutrition needs to be given through a persons veins. Implanted ports are used for long-term IV access. An implanted port may be placed because:  You need IV medicine that would be irritating to the small veins in your hands or arms.  You need long-term IV medicines, such as antibiotics.  You need IV nutrition for a long period.  You need frequent blood draws for lab tests.  You need dialysis.  Implanted ports are usually placed in the chest area, but they can also be placed in the upper arm, the abdomen, or the leg. An implanted port has two main parts:  Reservoir. The reservoir is round and will appear as a small, raised area under your skin. The reservoir is the part where a needle is inserted to give medicines or draw blood.  Catheter. The catheter is a thin, flexible tube that extends from the reservoir. The catheter is placed into a large vein. Medicine that is inserted into the reservoir goes into the catheter and then into the vein.  How will I care for my incision site? Do not get the incision site wet. Bathe or shower as directed by your health care provider. How is my port accessed? Special steps must be taken to access the port:  Before the port is accessed, a numbing cream can be placed on the skin. This helps numb the skin over the port site.  Your health care provider uses a sterile technique to access the port. ? Your health care provider must put on a mask and sterile gloves. ? The skin over your port is cleaned carefully with an antiseptic and allowed to dry. ? The port is gently pinched between sterile gloves, and a needle is inserted into the port.  Only "non-coring" port needles should be used to access the port. Once the port is accessed, a  blood return should be checked. This helps ensure that the port is in the vein and is not clogged.  If your port needs to remain accessed for a constant infusion, a clear (transparent) bandage will be placed over the needle site. The bandage and needle will need to be changed every week, or as directed by your health care provider.  Keep the bandage covering the needle clean and dry. Do not get it wet. Follow your health care providers instructions on how to take a shower or bath while the port is accessed.  If your port does not need to stay accessed, no bandage is needed over the port.  What is flushing? Flushing helps keep the port from getting clogged. Follow your health care providers instructions on how and when to flush the port. Ports are usually flushed with saline solution or a medicine called heparin. The need for flushing will depend on how the port is used.  If the port is used for intermittent medicines or blood draws, the port will need to be flushed: ? After medicines have been given. ? After blood has been drawn. ? As part of routine maintenance.  If a constant infusion is running, the port may not need to be flushed.  How long will my port stay implanted? The port can stay in for as long as your health care provider thinks it is needed.  When it is time for the port to come out, surgery will be done to remove it. The procedure is similar to the one performed when the port was put in. When should I seek immediate medical care? When you have an implanted port, you should seek immediate medical care if:  You notice a bad smell coming from the incision site.  You have swelling, redness, or drainage at the incision site.  You have more swelling or pain at the port site or the surrounding area.  You have a fever that is not controlled with medicine.  This information is not intended to replace advice given to you by your health care provider. Make sure you discuss any  questions you have with your health care provider. Document Released: 11/13/2005 Document Revised: 04/20/2016 Document Reviewed: 07/21/2013 Elsevier Interactive Patient Education  2017 Portal. Moderate Conscious Sedation, Adult, Care After These instructions provide you with information about caring for yourself after your procedure. Your health care provider may also give you more specific instructions. Your treatment has been planned according to current medical practices, but problems sometimes occur. Call your health care provider if you have any problems or questions after your procedure. What can I expect after the procedure? After your procedure, it is common:  To feel sleepy for several hours.  To feel clumsy and have poor balance for several hours.  To have poor judgment for several hours.  To vomit if you eat too soon.  Follow these instructions at home: For at least 24 hours after the procedure:   Do not: ? Participate in activities where you could fall or become injured. ? Drive. ? Use heavy machinery. ? Drink alcohol. ? Take sleeping pills or medicines that cause drowsiness. ? Make important decisions or sign legal documents. ? Take care of children on your own.  Rest. Eating and drinking  Follow the diet recommended by your health care provider.  If you vomit: ? Drink water, juice, or soup when you can drink without vomiting. ? Make sure you have little or no nausea before eating solid foods. General instructions  Have a responsible adult stay with you until you are awake and alert.  Take over-the-counter and prescription medicines only as told by your health care provider.  If you smoke, do not smoke without supervision.  Keep all follow-up visits as told by your health care provider. This is important. Contact a health care provider if:  You keep feeling nauseous or you keep vomiting.  You feel light-headed.  You develop a rash.  You have a  fever. Get help right away if:  You have trouble breathing. This information is not intended to replace advice given to you by your health care provider. Make sure you discuss any questions you have with your health care provider. Document Released: 09/03/2013 Document Revised: 04/17/2016 Document Reviewed: 03/04/2016 Elsevier Interactive Patient Education  [employer].

## 2018-05-31 NOTE — Progress Notes (Signed)
Lm rtn call

## 2018-06-01 ENCOUNTER — Other Ambulatory Visit: Payer: Self-pay | Admitting: Oncology

## 2018-06-04 ENCOUNTER — Ambulatory Visit: Payer: Self-pay | Admitting: Radiation Oncology

## 2018-06-04 ENCOUNTER — Inpatient Hospital Stay: Admission: RE | Admit: 2018-06-04 | Payer: Medicare Other | Source: Ambulatory Visit | Admitting: Radiation Oncology

## 2018-06-06 ENCOUNTER — Inpatient Hospital Stay: Payer: Medicare Other | Attending: Nurse Practitioner

## 2018-06-06 ENCOUNTER — Telehealth: Payer: Self-pay | Admitting: Oncology

## 2018-06-06 ENCOUNTER — Encounter: Payer: Self-pay | Admitting: Nurse Practitioner

## 2018-06-06 ENCOUNTER — Inpatient Hospital Stay (HOSPITAL_BASED_OUTPATIENT_CLINIC_OR_DEPARTMENT_OTHER): Payer: Medicare Other | Admitting: Nurse Practitioner

## 2018-06-06 ENCOUNTER — Inpatient Hospital Stay: Payer: Medicare Other

## 2018-06-06 ENCOUNTER — Other Ambulatory Visit: Payer: Self-pay | Admitting: Medical Oncology

## 2018-06-06 VITALS — BP 144/80 | HR 105 | Temp 98.9°F | Ht 62.0 in | Wt 118.4 lb

## 2018-06-06 DIAGNOSIS — Z5112 Encounter for antineoplastic immunotherapy: Secondary | ICD-10-CM | POA: Insufficient documentation

## 2018-06-06 DIAGNOSIS — Z85038 Personal history of other malignant neoplasm of large intestine: Secondary | ICD-10-CM | POA: Insufficient documentation

## 2018-06-06 DIAGNOSIS — R5381 Other malaise: Secondary | ICD-10-CM | POA: Diagnosis not present

## 2018-06-06 DIAGNOSIS — J449 Chronic obstructive pulmonary disease, unspecified: Secondary | ICD-10-CM | POA: Diagnosis not present

## 2018-06-06 DIAGNOSIS — Z923 Personal history of irradiation: Secondary | ICD-10-CM | POA: Diagnosis not present

## 2018-06-06 DIAGNOSIS — C3411 Malignant neoplasm of upper lobe, right bronchus or lung: Secondary | ICD-10-CM | POA: Diagnosis not present

## 2018-06-06 DIAGNOSIS — M545 Low back pain: Secondary | ICD-10-CM | POA: Insufficient documentation

## 2018-06-06 LAB — CMP (CANCER CENTER ONLY)
ALT: 9 U/L (ref 0–44)
AST: 14 U/L — ABNORMAL LOW (ref 15–41)
Albumin: 3.8 g/dL (ref 3.5–5.0)
Alkaline Phosphatase: 88 U/L (ref 38–126)
Anion gap: 9 (ref 5–15)
BUN: 7 mg/dL — ABNORMAL LOW (ref 8–23)
CHLORIDE: 102 mmol/L (ref 98–111)
CO2: 29 mmol/L (ref 22–32)
CREATININE: 0.64 mg/dL (ref 0.44–1.00)
Calcium: 9 mg/dL (ref 8.9–10.3)
Glucose, Bld: 88 mg/dL (ref 70–99)
Potassium: 4.9 mmol/L (ref 3.5–5.1)
SODIUM: 140 mmol/L (ref 135–145)
Total Bilirubin: 0.6 mg/dL (ref 0.3–1.2)
Total Protein: 7.1 g/dL (ref 6.5–8.1)

## 2018-06-06 MED ORDER — HEPARIN SOD (PORK) LOCK FLUSH 100 UNIT/ML IV SOLN
500.0000 [IU] | Freq: Once | INTRAVENOUS | Status: AC | PRN
  Administered 2018-06-06: 500 [IU]
  Filled 2018-06-06: qty 5

## 2018-06-06 MED ORDER — TRAMADOL HCL 50 MG PO TABS: 50.0000 mg | ORAL_TABLET | Freq: Three times a day (TID) | ORAL | 0 refills | Status: DC | PRN

## 2018-06-06 MED ORDER — SODIUM CHLORIDE 0.9 % IV SOLN
Freq: Once | INTRAVENOUS | Status: AC
  Administered 2018-06-06: 15:00:00 via INTRAVENOUS

## 2018-06-06 MED ORDER — SODIUM CHLORIDE 0.9% FLUSH
10.0000 mL | INTRAVENOUS | Status: DC | PRN
  Administered 2018-06-06: 10 mL
  Filled 2018-06-06: qty 10

## 2018-06-06 MED ORDER — SODIUM CHLORIDE 0.9 % IV SOLN
9.2000 mg/kg | Freq: Once | INTRAVENOUS | Status: AC
  Administered 2018-06-06: 500 mg via INTRAVENOUS
  Filled 2018-06-06: qty 10

## 2018-06-06 NOTE — Progress Notes (Signed)
Per Lisa-NP: OK to proceed with treatment with CBC from last week

## 2018-06-06 NOTE — Patient Instructions (Signed)
[facility] Discharge Instructions for Patients Receiving Chemotherapy  Today you received the following chemotherapy agents:  Durvalumab (Imfinzi)  To help prevent nausea and vomiting after your treatment, we encourage you to take your nausea medication as prescribed.   If you develop nausea and vomiting that is not controlled by your nausea medication, call the clinic.   BELOW ARE SYMPTOMS THAT SHOULD BE REPORTED IMMEDIATELY:  *FEVER GREATER THAN 100.5 F  *CHILLS WITH OR WITHOUT FEVER  NAUSEA AND VOMITING THAT IS NOT CONTROLLED WITH YOUR NAUSEA MEDICATION  *UNUSUAL SHORTNESS OF BREATH  *UNUSUAL BRUISING OR BLEEDING  TENDERNESS IN MOUTH AND THROAT WITH OR WITHOUT PRESENCE OF ULCERS  *URINARY PROBLEMS  *BOWEL PROBLEMS  UNUSUAL RASH Items with * indicate a potential emergency and should be followed up as soon as possible.  Feel free to call the clinic should you have any questions or concerns. The clinic phone number is [phone].  Please show the CHEMO ALERT CARD at check-in to the Emergency Department and triage nurse.   

## 2018-06-06 NOTE — Telephone Encounter (Signed)
Appointments scheduled AVS/Calendar printed per 7/11 los

## 2018-06-06 NOTE — Progress Notes (Signed)
[facility] OFFICE PROGRESS NOTE   Diagnosis: Non-small cell lung cancer  INTERVAL HISTORY:   [name] returns as scheduled.  She completed cycle 1 Imfinzi 05/23/2018. She denies nausea/vomiting.  No mouth sores.  No diarrhea.  No rash.  She denies shortness of breath.  She has been having intermittent low back pain for several months.  The pain has not changed.  She thinks it is related to how she sits when in front of a computer.  She was previously taking an old prescription of oxycodone but ran out.  She has tried ibuprofen 800 mg but notes this is less effective than the oxycodone.  She reports right chest discomfort related to the recent Port-A-Cath placement.  Objective:  Vital signs in last 24 hours:  Blood pressure (!) 144/80, pulse (!) 105, temperature 98.9 F (37.2 C), temperature source Oral, height '5\' 2"'$  (1.575 m), weight 118 lb 6.4 oz (53.7 kg), SpO2 100 %.    HEENT: No thrush or ulcers. Resp: Lungs clear bilaterally. Cardio: Regular rate and rhythm. GI: Abdomen soft and nontender.  No hepatomegaly. Vascular: No leg edema.  Skin: No rash. Port-A-Cath site with resolving surrounding ecchymosis.   Lab Results:  Lab Results  Component Value Date   WBC 4.3 05/31/2018   HGB 14.0 05/31/2018   HCT 41.9 05/31/2018   MCV 97.2 05/31/2018   PLT 147 (L) 05/31/2018   NEUTROABS 3.1 05/31/2018    Imaging:  No results found.  Medications: I have reviewed the patient's current medications.  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  Status postlaparoscopic appendectomy 09/12/2017. Postoperative course complicated by an ileus requiring TPN.   Final pathology of the appendix-5 cm adenocarcinoma/goblet cell carcinoid. The tumor extended through the muscularis propria into subserosal soft  tissue and mesoappendix and focally to the serosal surface. Lymphovascular space invasion identified. Perineural invasion identified. Associated acute appendicitis with perforation.  10/15/2017 CEA 1.  11/22/2017 chromogranin A-3  Status postlaparoscopic right hemicolectomy by Dr. Janace Litten 12/07/2017. No carcinomatosis or liver metastases were seen.   Final pathologyshowed metastatic adenocarcinoma/goblet cell carcinoid in 3 of 15 pericolic lymph nodes; maximum size of largest metastasis 0.2 cm; positive for extracapsular extension. Benign terminal ileum with fibrous serosal adhesions, negative for dysplasia or carcinoma; surgically absent appendix; appendectomy site with postsurgical changes and fibrous serosal adhesions, negative for carcinoma; proximal and distal mucosal margins negative for dysplasia or carcinoma; mesenteric resection margin negative for carcinoma.  Cycle 1 adjuvant Xeloda 01/07/2018  Cycle 2 adjuvant Xeloda 01/28/2018 2. Non-small cell lung cancer  PET [id number] hypermetabolic right upper lobe pulmonary nodules increased in size and metabolic activity. No mediastinal nodal metastasis. 4 mm right upper lobe nodule with metabolic activity, SUV 3.6. A third right upper lobe nodule measures 4 mm and has faint metabolic activity. No clear evidence of malignancy in the abdomen or pelvis. Mild activity along the ventral abdominal surgical scar. Scattered activity in small bowel without focal lesion on CT.  CT biopsy right upper lobe nodule 11/02/2017 showed non-small cell carcinoma; malignant cells positive for TTF-1 and negative for p63. PD-L1 95%.  Chest CT 01/21/2018-new mediastinal adenopathy seen in the prevascular and right paratracheal stations. Largest lymph node is seen in the prevascular space measuring 2.0 cm, previously 6 mm. No hilar or axillary adenopathy. Right lung nodules are stable.  Biopsy of a right paratracheal lymph node on  02/04/2018-metastatic adenocarcinoma consistent with a lung primary  Chest radiation started 03/11/2018  Week 1 Taxol/carboplatin 03/13/2018  Week 2 Taxol/carboplatin 03/20/2018  Week 3 Taxol/carboplatin 03/27/2018  Week4Taxol/carboplatin 04/03/2018  Week 5 Taxol/carboplatin 04/10/2018  Week 6 Taxol/carboplatin 04/19/2018  Chest radiation completed 04/25/2018  CT chest 05/23/2018-interval decrease in size of mediastinal adenopathy.  Near complete resolution of previously visualized right pleural effusion.  The right lung nodules have decreased in size.  Other nodules are stable.  Cycle 1 durvalumab 05/23/2018  Cycle 2 durvalumab 06/06/2018   3. COPD  4.VATS drainage of a loculated right pleural effusion 03/01/2018  5.  Port-A-Cath placement 05/31/2018   Disposition: [name] appears stable.  She has completed 1 cycle of Imfinzi.  Plan to proceed with cycle 2 today as scheduled.  For the pain associated with recent Port-A-Cath placement as well as the low back pain she will try tramadol 50 mg every 8 hours as needed.  She understands she should not be driving while taking tramadol.  She will return for lab, follow-up and cycle 3 Imfinzi in 2 weeks.  She will contact the office in the interim with any problems.    Ned Card ANP/GNP-BC   06/06/2018  2:07 PM

## 2018-06-12 ENCOUNTER — Ambulatory Visit
Admission: RE | Admit: 2018-06-12 | Discharge: 2018-06-12 | Disposition: A | Discharge status: Home / Self Care | Payer: Medicare Other | Source: Ambulatory Visit | Attending: Radiation Oncology | Admitting: Radiation Oncology

## 2018-06-12 ENCOUNTER — Encounter: Payer: Self-pay | Admitting: Radiation Oncology

## 2018-06-12 VITALS — BP 127/88 | HR 98 | Temp 99.1°F | Resp 18 | Ht 62.0 in | Wt 116.4 lb

## 2018-06-12 DIAGNOSIS — C3411 Malignant neoplasm of upper lobe, right bronchus or lung: Secondary | ICD-10-CM | POA: Insufficient documentation

## 2018-06-12 DIAGNOSIS — Z452 Encounter for adjustment and management of vascular access device: Secondary | ICD-10-CM | POA: Insufficient documentation

## 2018-06-12 DIAGNOSIS — J309 Allergic rhinitis, unspecified: Secondary | ICD-10-CM | POA: Diagnosis not present

## 2018-06-12 DIAGNOSIS — Y842 Radiological procedure and radiotherapy as the cause of abnormal reaction of the patient, or of later complication, without mention of misadventure at the time of the procedure: Secondary | ICD-10-CM | POA: Insufficient documentation

## 2018-06-12 DIAGNOSIS — K208 Other esophagitis: Secondary | ICD-10-CM | POA: Insufficient documentation

## 2018-06-12 DIAGNOSIS — Z79899 Other long term (current) drug therapy: Secondary | ICD-10-CM | POA: Diagnosis not present

## 2018-06-12 DIAGNOSIS — Z7951 Long term (current) use of inhaled steroids: Secondary | ICD-10-CM | POA: Insufficient documentation

## 2018-06-12 DIAGNOSIS — H5789 Other specified disorders of eye and adnexa: Secondary | ICD-10-CM | POA: Diagnosis not present

## 2018-06-12 MED ORDER — LIDOCAINE-PRILOCAINE 2.5-2.5 % EX CREA: TOPICAL_CREAM | CUTANEOUS | 1 refills | Status: DC

## 2018-06-12 MED FILL — LIDOCAINE-PRILOCAINE CREAM: 2.5-2.5 | 30 days supply | Qty: 30 | Fill #0

## 2018-06-12 NOTE — Progress Notes (Signed)
Radiation Oncology         [phone] ________________________________  Name: Melissa Golden MRN: [id number]  Date of Service: 06/12/2018 DOB: 1946-05-14  Post Treatment Note  CC: Rory Percy, MD  Ladell Pier, MD  Diagnosis:   Multifocal Stage III NSCLC, adenocarcinoma of the RUL     Interval Since Last Radiation: 7 weeks   03/11/2018 - 04/25/2018: The patient was treated to the disease within the RUL lung initially to a dose of 60 Gy using a 4 field, 3-D conformal technique. The patient then received a cone down boost treatment for an additional 6 Gy. This yielded a final total dose of 66 Gy.     Narrative:  The patient returns today for routine follow-up.  Patient tolerated radiotherapy that she did have desquamation and esophagitis that were significant during her course of treatment.  She has since completed her regimen, and did undergo   repeat imaging of the chest on 05/22/2018 which shows improvement of her disease.  She continues on systemic therapy with immune therapy.                           On review of systems, the patient states she is doing well overall.  She requests a prescription for numbing cream for her Port-A-Cath site.  She also states that she has had some runny nose, and itchy eyes.  This has occurred in the last few weeks.  She has previously taken Claritin successfully but states that the cost of it has been prohibitive as of late.  She denies any shortness of breath or chest pain.  She reports her skin has improved since completing treatment.  She denies any esophagitis symptoms at this time.  She is eating well, but does state that she occasionally has to chew her food more thoroughly before swallowing.  No other complaints are verbalized.  ALLERGIES:  has No Known Allergies.  Meds: Current Outpatient Medications  Medication Sig Dispense Refill  . acetaminophen (TYLENOL) 500 MG tablet Take 2 tablets (1,000 mg total) by mouth every 6 (six) hours.  30 tablet 0  . albuterol (PROVENTIL HFA;VENTOLIN HFA) 108 (90 BASE) MCG/ACT inhaler Inhale 2 puffs into the lungs 3 (three) times daily. Shortness of Breath     . budesonide (PULMICORT) 180 MCG/ACT inhaler Inhale 2 puffs into the lungs 2 (two) times daily.    Marland Kitchen ibuprofen (ADVIL,MOTRIN) 200 MG tablet Take 400 mg by mouth every 6 (six) hours as needed for moderate pain. Pain     . Multiple Vitamin (MULTIVITAMIN WITH MINERALS) TABS Take 1 tablet by mouth daily.    . traMADol (ULTRAM) 50 MG tablet Take 1 tablet (50 mg total) by mouth every 8 (eight) hours as needed. 20 tablet 0  . docusate sodium (COLACE) 100 MG capsule Take 1 capsule (100 mg total) by mouth 2 (two) times daily. (Patient not taking: Reported on 06/12/2018) 10 capsule 0  . lidocaine-prilocaine (EMLA) cream Apply to PAC 30 min prior to flush or infusions 30 g 1  . prochlorperazine (COMPAZINE) 5 MG tablet Take 1 tablet (5 mg total) by mouth every 6 (six) hours as needed for nausea or vomiting. (Patient not taking: Reported on 05/23/2018) 30 tablet 0   No current facility-administered medications for this encounter.     Physical Findings:  height is _0  (1.575 m) and weight is 116 lb 6.4 oz (52.8 kg). Her oral temperature is 99.1 F (37.3  C). Her blood pressure is 127/88 and her pulse is 98. Her respiration is 18 and oxygen saturation is 95%.  Pain Assessment Pain Score: 2  Pain Loc: Back/10 In general this is a well appearing Caucasian female in no acute distress.  She's alert and oriented x4 and appropriate throughout the examination. Cardiopulmonary assessment is negative for acute distress and she exhibits normal effort.  Chest is clear to auscultation bilaterally, no evidence of desquamation is noted of the anterior posterior chest.  She has an in situ right chest Port-A-Cath where there is ecchymosis from placement.  No cellulitic appearing changes are noted and Steri-Strips are over the insertion site.  Lab Findings: Lab Results   Component Value Date   WBC 4.3 05/31/2018   HGB 14.0 05/31/2018   HCT 41.9 05/31/2018   MCV 97.2 05/31/2018   PLT 147 (L) 05/31/2018     Radiographic Findings: Ct Chest W Contrast  Result Date: 05/23/2018 CLINICAL DATA:  Patient with history of lung cancer. Follow-up evaluation. EXAM: CT CHEST WITH CONTRAST TECHNIQUE: Multidetector CT imaging of the chest was performed during intravenous contrast administration. CONTRAST:  53m OMNIPAQUE IOHEXOL 300 MG/ML  SOLN COMPARISON:  Chest CT 02/18/2018 FINDINGS: Cardiovascular: Normal heart size. Trace fluid superior pericardial recess. Thoracic aortic vascular calcifications. Coronary arterial vascular calcifications. Mediastinum/Nodes: No axillary adenopathy. Interval decrease in size of right paratracheal lymph node measuring 0.9 cm (image 47; series 2), previously 1.3 cm. Interval decrease in size of superior mediastinal lymph node measuring 0.7 cm (image 30; series 2), previously 1.3 cm. Esophagus is normal in appearance. Lungs/Pleura: Central airways are patent. Centrilobular and paraseptal emphysematous change. Slight interval decrease in size of irregular nodule within the medial right upper lobe measuring 0.4 cm (image 30; series 7), previously 0.6 cm. Similar appearing 5 mm nodule right upper lobe (image 42; series 7). Interval decrease in size of nodule within the right upper lobe along the minor fissure measuring 0.5 cm (image 64; series 7), previously 0.8 cm. Atelectasis/scarring within the medial right upper lobe. Similar-appearing 6 mm nodule medial left lower lobe (image 118; series 7). Significant interval decrease and near complete resolution of previously visualized right pleural effusion. No pneumothorax. Upper Abdomen: Liver is normal in size and contour. Gallbladder is unremarkable. No acute process. Musculoskeletal: Thoracic spine degenerative changes. No aggressive or acute appearing osseous lesions. Healing right lateral eighth rib  fracture. IMPRESSION: 1. Interval decrease in size of mediastinal adenopathy. 2. Near complete resolution of previously visualized right pleural effusion. 3. A few of the right lung nodules have decreased in size. Other nodules are stable. 4. Aortic Atherosclerosis (ICD10-I70.0) and Emphysema (ICD10-J43.9). Electronically Signed   By: DLovey NewcomerM.D.   On: 05/23/2018 17:38   Ir Imaging Guided Port Insertion  Result Date: 05/31/2018 INDICATION: History of metastatic lung cancer. In need of durable intravenous access for chemotherapy administration. EXAM: IMPLANTED PORT A CATH PLACEMENT WITH ULTRASOUND AND FLUOROSCOPIC GUIDANCE COMPARISON:  Chest CT - 05/22/2018 MEDICATIONS: Ancef 2 gm IV; The antibiotic was administered within an appropriate time interval prior to skin puncture. ANESTHESIA/SEDATION: Moderate (conscious) sedation was employed during this procedure. A total of Versed 1 mg and Fentanyl 50 mcg was administered intravenously. Moderate Sedation Time: 30 minutes. The patient's level of consciousness and vital signs were monitored continuously by radiology nursing throughout the procedure under my direct supervision. CONTRAST:  None FLUOROSCOPY TIME:  3 minutes, 54 seconds (18 mGy) COMPLICATIONS: None immediate. PROCEDURE: The procedure, risks, benefits, and alternatives were explained to  the patient. Questions regarding the procedure were encouraged and answered. The patient understands and consents to the procedure. The right neck and chest were prepped with chlorhexidine in a sterile fashion, and a sterile drape was applied covering the operative field. Maximum barrier sterile technique with sterile gowns and gloves were used for the procedure. A timeout was performed prior to the initiation of the procedure. Local anesthesia was provided with 1% lidocaine with epinephrine. After creating a small venotomy incision, a micropuncture kit was utilized to access the internal jugular vein. Real-time  ultrasound guidance was utilized for vascular access including the acquisition of a permanent ultrasound image documenting patency of the accessed vessel. The microwire was utilized to measure appropriate catheter length. A subcutaneous port pocket was then created along the upper chest wall utilizing a combination of sharp and blunt dissection. The pocket was irrigated with sterile saline. A single lumen thin power injectable port was chosen for placement. The 8 Fr catheter was tunneled from the port pocket site to the venotomy incision. The port was placed in the pocket. The external catheter was trimmed to appropriate length. At the venotomy, an 8 Fr peel-away sheath was placed over a guidewire under fluoroscopic guidance. The catheter was then placed through the sheath and the sheath was removed. Final catheter positioning was confirmed and documented with a fluoroscopic spot radiograph. The port was accessed with a Huber needle, aspirated and flushed with heparinized saline. The venotomy site was closed with an interrupted 4-0 Vicryl suture. The port pocket incision was closed with interrupted 2-0 Vicryl suture and the skin was opposed with a running subcuticular 4-0 Vicryl suture. Dermabond and Steri-strips were applied to both incisions. Dressings were placed. The patient tolerated the procedure well without immediate post procedural complication. FINDINGS: After catheter placement, the tip lies within the superior cavoatrial junction. The catheter aspirates and flushes normally and is ready for immediate use. IMPRESSION: Successful placement of a right internal jugular approach power injectable Port-A-Cath. The catheter is ready for immediate use. Electronically Signed   By: Sandi Mariscal M.D.   On: 05/31/2018 14:32    Impression/Plan: 1. Multifocal Stage III NSCLC, adenocarcinoma of the RUL.  The patient is recovering well from the effects of radiotherapy.  She did have significant esophagitis, which has  ultimately improved.  Her desquamation to the chest anteriorly and posteriorly has also improved.  She continues on systemic immunotherapy, and will follow up with Dr. Benay Spice next week for consideration of her next cycle.  Her most recent imaging does show that she has had improvement of her disease since treatment.  We will follow-up with her as needed moving forward.   2. Allergic rhinitis.  The patient was counseled on purchasing her medication from [facility] as the cost is much improved compared to outpatient pharmacies. 3. In situ Port-A-Cath.  The patient requested a prescription for EMLA cream.  This was called into [facility] outpatient pharmacy as well. 4. Radiation esophagitis.  Although this is improved significantly, the patient does state that she has been experiencing some trouble when swallowing large boluses of food.  She is encouraged to continue to try and chew it up as well as she can before swallowing, but also counseled on the possibility that this could also be a result of stricture from radiotherapy.  If her symptoms progress, we did discuss an evaluation with GI.  She states agreement and understanding of this plan.      Carola Rhine, PAC

## 2018-06-12 NOTE — Addendum Note (Signed)
Encounter addended by: Malena Edman, RN on: 06/12/2018 2:47 PM  Actions taken: Charge Capture section accepted

## 2018-06-13 ENCOUNTER — Other Ambulatory Visit: Payer: Self-pay | Admitting: Oncology

## 2018-06-20 ENCOUNTER — Inpatient Hospital Stay: Payer: Medicare Other

## 2018-06-20 ENCOUNTER — Other Ambulatory Visit: Payer: Medicare Other

## 2018-06-20 ENCOUNTER — Inpatient Hospital Stay (HOSPITAL_BASED_OUTPATIENT_CLINIC_OR_DEPARTMENT_OTHER): Payer: Medicare Other | Admitting: Oncology

## 2018-06-20 ENCOUNTER — Telehealth: Payer: Self-pay | Admitting: Oncology

## 2018-06-20 VITALS — BP 117/65 | HR 99 | Temp 99.0°F | Resp 18 | Wt 115.1 lb

## 2018-06-20 DIAGNOSIS — Z5181 Encounter for therapeutic drug level monitoring: Secondary | ICD-10-CM

## 2018-06-20 DIAGNOSIS — M545 Low back pain: Secondary | ICD-10-CM | POA: Diagnosis not present

## 2018-06-20 DIAGNOSIS — Z923 Personal history of irradiation: Secondary | ICD-10-CM | POA: Diagnosis not present

## 2018-06-20 DIAGNOSIS — C3411 Malignant neoplasm of upper lobe, right bronchus or lung: Secondary | ICD-10-CM

## 2018-06-20 DIAGNOSIS — J449 Chronic obstructive pulmonary disease, unspecified: Secondary | ICD-10-CM | POA: Diagnosis not present

## 2018-06-20 DIAGNOSIS — R5381 Other malaise: Secondary | ICD-10-CM | POA: Diagnosis not present

## 2018-06-20 DIAGNOSIS — R5383 Other fatigue: Secondary | ICD-10-CM | POA: Diagnosis not present

## 2018-06-20 DIAGNOSIS — Z85038 Personal history of other malignant neoplasm of large intestine: Secondary | ICD-10-CM

## 2018-06-20 DIAGNOSIS — Z5112 Encounter for antineoplastic immunotherapy: Secondary | ICD-10-CM | POA: Diagnosis not present

## 2018-06-20 DIAGNOSIS — Z95828 Presence of other vascular implants and grafts: Secondary | ICD-10-CM

## 2018-06-20 LAB — CMP (CANCER CENTER ONLY)
ALK PHOS: 79 U/L (ref 38–126)
ALT: 8 U/L (ref 0–44)
AST: 15 U/L (ref 15–41)
Albumin: 3.2 g/dL — ABNORMAL LOW (ref 3.5–5.0)
Anion gap: 9 (ref 5–15)
BILIRUBIN TOTAL: 0.5 mg/dL (ref 0.3–1.2)
BUN: 8 mg/dL (ref 8–23)
CALCIUM: 9.3 mg/dL (ref 8.9–10.3)
CO2: 29 mmol/L (ref 22–32)
Chloride: 102 mmol/L (ref 98–111)
Creatinine: 0.59 mg/dL (ref 0.44–1.00)
Glucose, Bld: 100 mg/dL — ABNORMAL HIGH (ref 70–99)
Potassium: 4 mmol/L (ref 3.5–5.1)
Sodium: 140 mmol/L (ref 135–145)
TOTAL PROTEIN: 7 g/dL (ref 6.5–8.1)

## 2018-06-20 LAB — CBC WITH DIFFERENTIAL (CANCER CENTER ONLY)
Basophils Absolute: 0 10*3/uL (ref 0.0–0.1)
Basophils Relative: 0 %
Eosinophils Absolute: 0.1 10*3/uL (ref 0.0–0.5)
Eosinophils Relative: 1 %
HEMATOCRIT: 35.5 % (ref 34.8–46.6)
Hemoglobin: 11.6 g/dL (ref 11.6–15.9)
Lymphocytes Relative: 9 %
Lymphs Abs: 0.5 10*3/uL — ABNORMAL LOW (ref 0.9–3.3)
MCH: 31.7 pg (ref 25.1–34.0)
MCHC: 32.7 g/dL (ref 31.5–36.0)
MCV: 97 fL (ref 79.5–101.0)
MONO ABS: 0.7 10*3/uL (ref 0.1–0.9)
MONOS PCT: 14 %
NEUTROS ABS: 3.6 10*3/uL (ref 1.5–6.5)
Neutrophils Relative %: 76 %
Platelet Count: 183 10*3/uL (ref 145–400)
RBC: 3.66 MIL/uL — ABNORMAL LOW (ref 3.70–5.45)
RDW: 13.9 % (ref 11.2–14.5)
WBC Count: 4.8 10*3/uL (ref 3.9–10.3)

## 2018-06-20 MED ORDER — SODIUM CHLORIDE 0.9% FLUSH
10.0000 mL | INTRAVENOUS | Status: DC | PRN
  Administered 2018-06-20: 10 mL via INTRAVENOUS
  Filled 2018-06-20: qty 10

## 2018-06-20 MED ORDER — SODIUM CHLORIDE 0.9 % IV SOLN
Freq: Once | INTRAVENOUS | Status: AC
  Administered 2018-06-20: 14:00:00 via INTRAVENOUS
  Filled 2018-06-20: qty 250

## 2018-06-20 MED ORDER — SODIUM CHLORIDE 0.9% FLUSH
10.0000 mL | INTRAVENOUS | Status: DC | PRN
  Administered 2018-06-20: 10 mL
  Filled 2018-06-20: qty 10

## 2018-06-20 MED ORDER — HEPARIN SOD (PORK) LOCK FLUSH 100 UNIT/ML IV SOLN
500.0000 [IU] | Freq: Once | INTRAVENOUS | Status: AC | PRN
  Administered 2018-06-20: 500 [IU]
  Filled 2018-06-20: qty 5

## 2018-06-20 MED ORDER — SODIUM CHLORIDE 0.9 % IV SOLN
500.0000 mg | Freq: Once | INTRAVENOUS | Status: AC
  Administered 2018-06-20: 500 mg via INTRAVENOUS
  Filled 2018-06-20: qty 10

## 2018-06-20 NOTE — Patient Instructions (Signed)
[facility] Discharge Instructions for Patients Receiving Chemotherapy  Today you received the following chemotherapy agents:  Durvalumab (Imfinzi)  To help prevent nausea and vomiting after your treatment, we encourage you to take your nausea medication as prescribed.   If you develop nausea and vomiting that is not controlled by your nausea medication, call the clinic.   BELOW ARE SYMPTOMS THAT SHOULD BE REPORTED IMMEDIATELY:  *FEVER GREATER THAN 100.5 F  *CHILLS WITH OR WITHOUT FEVER  NAUSEA AND VOMITING THAT IS NOT CONTROLLED WITH YOUR NAUSEA MEDICATION  *UNUSUAL SHORTNESS OF BREATH  *UNUSUAL BRUISING OR BLEEDING  TENDERNESS IN MOUTH AND THROAT WITH OR WITHOUT PRESENCE OF ULCERS  *URINARY PROBLEMS  *BOWEL PROBLEMS  UNUSUAL RASH Items with * indicate a potential emergency and should be followed up as soon as possible.  Feel free to call the clinic should you have any questions or concerns. The clinic phone number is [phone].  Please show the CHEMO ALERT CARD at check-in to the Emergency Department and triage nurse.   

## 2018-06-20 NOTE — Telephone Encounter (Signed)
Gave patient avs and calendar of upcoming appts.  °

## 2018-06-20 NOTE — Progress Notes (Signed)
[facility] OFFICE PROGRESS NOTE   Diagnosis: Non-small cell lung cancer  INTERVAL HISTORY:   Ms. Lobue turns as scheduled.  She completed another treatment with durvalumab on 06/06/2018.  No rash, diarrhea, or cough.  She reports malaise since placement of the Port-A-Cath 05/31/2018.  No change in baseline dyspnea.  Objective:  Vital signs in last 24 hours:  Blood pressure 117/65, pulse 99, temperature 99 F (37.2 C), temperature source Oral, resp. rate 18, SpO2 95 %.   Resp: Distant breath sounds, end inspiratory rhonchi and mild expiratory wheeze at the anterior chest bilaterally, no respiratory distress Cardio: Regular rhythm with premature beats and grouped beats GI: No hepatomegaly Vascular: Leg edema  Skin: No rash  Portacath/PICC-without erythema  Lab Results:  Lab Results  Component Value Date   WBC 4.3 05/31/2018   HGB 14.0 05/31/2018   HCT 41.9 05/31/2018   MCV 97.2 05/31/2018   PLT 147 (L) 05/31/2018   NEUTROABS 3.1 05/31/2018    CMP  Lab Results  Component Value Date   NA 140 06/20/2018   K 4.0 06/20/2018   CL 102 06/20/2018   CO2 29 06/20/2018   GLUCOSE 100 (H) 06/20/2018   BUN 8 06/20/2018   CREATININE 0.59 06/20/2018   CALCIUM 9.3 06/20/2018   PROT 7.0 06/20/2018   ALBUMIN 3.2 (L) 06/20/2018   AST 15 06/20/2018   ALT 8 06/20/2018   ALKPHOS 79 06/20/2018   BILITOT 0.5 06/20/2018   GFRNONAA >60 06/20/2018   GFRAA >60 06/20/2018     Medications: I have reviewed the patient's current medications.   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  Status postlaparoscopic appendectomy 09/12/2017. Postoperative course complicated by an ileus requiring TPN.   Final pathology of the appendix-5 cm adenocarcinoma/goblet cell carcinoid. The tumor extended through the  muscularis propria into subserosal soft tissue and mesoappendix and focally to the serosal surface. Lymphovascular space invasion identified. Perineural invasion identified. Associated acute appendicitis with perforation.  10/15/2017 CEA 1.  11/22/2017 chromogranin A-3  Status postlaparoscopic right hemicolectomy by Dr. Janace Litten 12/07/2017. No carcinomatosis or liver metastases were seen.   Final pathologyshowed metastatic adenocarcinoma/goblet cell carcinoid in 3 of 15 pericolic lymph nodes; maximum size of largest metastasis 0.2 cm; positive for extracapsular extension. Benign terminal ileum with fibrous serosal adhesions, negative for dysplasia or carcinoma; surgically absent appendix; appendectomy site with postsurgical changes and fibrous serosal adhesions, negative for carcinoma; proximal and distal mucosal margins negative for dysplasia or carcinoma; mesenteric resection margin negative for carcinoma.  Cycle 1 adjuvant Xeloda 01/07/2018  Cycle 2 adjuvant Xeloda 01/28/2018 2. Non-small cell lung cancer  PET [id number] hypermetabolic right upper lobe pulmonary nodules increased in size and metabolic activity. No mediastinal nodal metastasis. 4 mm right upper lobe nodule with metabolic activity, SUV 3.6. A third right upper lobe nodule measures 4 mm and has faint metabolic activity. No clear evidence of malignancy in the abdomen or pelvis. Mild activity along the ventral abdominal surgical scar. Scattered activity in small bowel without focal lesion on CT.  CT biopsy right upper lobe nodule 11/02/2017 showed non-small cell carcinoma; malignant cells positive for TTF-1 and negative for p63. PD-L1 95%.  Chest CT 01/21/2018-new mediastinal adenopathy seen in the prevascular and right paratracheal stations. Largest lymph node is seen in the prevascular space measuring 2.0 cm, previously 6 mm. No hilar or axillary adenopathy. Right lung nodules  are stable.  Biopsy  of a right paratracheal lymph node on 02/04/2018-metastatic adenocarcinoma consistent with a lung primary  Chest radiation started 03/11/2018  Week 1 Taxol/carboplatin 03/13/2018  Week 2 Taxol/carboplatin 03/20/2018  Week 3 Taxol/carboplatin 03/27/2018  Week4Taxol/carboplatin 04/03/2018  Week 5 Taxol/carboplatin 04/10/2018  Week 6 Taxol/carboplatin 04/19/2018  Chest radiation completed 04/25/2018  CT chest 05/23/2018-interval decrease in size of mediastinal adenopathy.  Near complete resolution of previously visualized right pleural effusion.  The right lung nodules have decreased in size.  Other nodules are stable.  Cycle 1 durvalumab 05/23/2018  Cycle 2 durvalumab 06/06/2018  Cycle 3 durvalumab 06/20/2018   3. COPD  4.VATS drainage of a loculated right pleural effusion 03/01/2018  5.  Port-A-Cath placement 05/31/2018     Disposition: Melissa Golden appears unchanged.  She is tolerating the durvalumab well.  The etiology of her malaise is unclear.  I doubt this is related to toxicity from treatment.  She will contact us for increased malaise.  She will return for an office visit and durvalumab in 2 weeks.  We will check a TSH when she is here in 2 weeks.  15 minutes were spent with the patient today.  The majority of the time was used for counseling and coordination of care.  Betsy Coder, MD  06/20/2018  12:49 PM

## 2018-06-29 ENCOUNTER — Other Ambulatory Visit: Payer: Self-pay | Admitting: Oncology

## 2018-07-01 DIAGNOSIS — J440 Chronic obstructive pulmonary disease with acute lower respiratory infection: Secondary | ICD-10-CM | POA: Diagnosis not present

## 2018-07-01 DIAGNOSIS — R509 Fever, unspecified: Secondary | ICD-10-CM | POA: Diagnosis not present

## 2018-07-01 DIAGNOSIS — R05 Cough: Secondary | ICD-10-CM | POA: Diagnosis not present

## 2018-07-01 DIAGNOSIS — R918 Other nonspecific abnormal finding of lung field: Secondary | ICD-10-CM | POA: Diagnosis not present

## 2018-07-01 DIAGNOSIS — Z85118 Personal history of other malignant neoplasm of bronchus and lung: Secondary | ICD-10-CM | POA: Diagnosis not present

## 2018-07-01 DIAGNOSIS — J439 Emphysema, unspecified: Secondary | ICD-10-CM | POA: Diagnosis not present

## 2018-07-01 DIAGNOSIS — I700 Atherosclerosis of aorta: Secondary | ICD-10-CM | POA: Diagnosis not present

## 2018-07-04 ENCOUNTER — Inpatient Hospital Stay (HOSPITAL_BASED_OUTPATIENT_CLINIC_OR_DEPARTMENT_OTHER): Payer: Medicare Other | Admitting: Oncology

## 2018-07-04 ENCOUNTER — Inpatient Hospital Stay: Payer: Medicare Other

## 2018-07-04 ENCOUNTER — Inpatient Hospital Stay: Payer: Medicare Other | Attending: Nurse Practitioner

## 2018-07-04 VITALS — BP 105/62 | HR 94 | Temp 98.6°F | Resp 17 | Ht 62.0 in | Wt 113.1 lb

## 2018-07-04 DIAGNOSIS — C3411 Malignant neoplasm of upper lobe, right bronchus or lung: Secondary | ICD-10-CM | POA: Insufficient documentation

## 2018-07-04 DIAGNOSIS — Z923 Personal history of irradiation: Secondary | ICD-10-CM | POA: Insufficient documentation

## 2018-07-04 DIAGNOSIS — M545 Low back pain: Secondary | ICD-10-CM | POA: Insufficient documentation

## 2018-07-04 DIAGNOSIS — Z5112 Encounter for antineoplastic immunotherapy: Secondary | ICD-10-CM | POA: Diagnosis not present

## 2018-07-04 DIAGNOSIS — R5381 Other malaise: Secondary | ICD-10-CM

## 2018-07-04 DIAGNOSIS — Z95828 Presence of other vascular implants and grafts: Secondary | ICD-10-CM | POA: Insufficient documentation

## 2018-07-04 DIAGNOSIS — J449 Chronic obstructive pulmonary disease, unspecified: Secondary | ICD-10-CM | POA: Insufficient documentation

## 2018-07-04 DIAGNOSIS — Z79899 Other long term (current) drug therapy: Secondary | ICD-10-CM

## 2018-07-04 DIAGNOSIS — Z5181 Encounter for therapeutic drug level monitoring: Secondary | ICD-10-CM

## 2018-07-04 DIAGNOSIS — Z85038 Personal history of other malignant neoplasm of large intestine: Secondary | ICD-10-CM | POA: Insufficient documentation

## 2018-07-04 DIAGNOSIS — R5383 Other fatigue: Secondary | ICD-10-CM

## 2018-07-04 LAB — CMP (CANCER CENTER ONLY)
ALT: 9 U/L (ref 0–44)
AST: 15 U/L (ref 15–41)
Albumin: 2.8 g/dL — ABNORMAL LOW (ref 3.5–5.0)
Alkaline Phosphatase: 61 U/L (ref 38–126)
Anion gap: 12 (ref 5–15)
BUN: 8 mg/dL (ref 8–23)
CHLORIDE: 96 mmol/L — AB (ref 98–111)
CO2: 30 mmol/L (ref 22–32)
Calcium: 8.8 mg/dL — ABNORMAL LOW (ref 8.9–10.3)
Creatinine: 0.58 mg/dL (ref 0.44–1.00)
Glucose, Bld: 123 mg/dL — ABNORMAL HIGH (ref 70–99)
POTASSIUM: 3.7 mmol/L (ref 3.5–5.1)
SODIUM: 138 mmol/L (ref 135–145)
Total Bilirubin: 0.4 mg/dL (ref 0.3–1.2)
Total Protein: 6.6 g/dL (ref 6.5–8.1)

## 2018-07-04 LAB — CBC WITH DIFFERENTIAL (CANCER CENTER ONLY)
Basophils Absolute: 0 10*3/uL (ref 0.0–0.1)
Basophils Relative: 0 %
Eosinophils Absolute: 0 10*3/uL (ref 0.0–0.5)
Eosinophils Relative: 0 %
HEMATOCRIT: 31.9 % — AB (ref 34.8–46.6)
HEMOGLOBIN: 10.4 g/dL — AB (ref 11.6–15.9)
LYMPHS PCT: 9 %
Lymphs Abs: 0.4 10*3/uL — ABNORMAL LOW (ref 0.9–3.3)
MCH: 30.5 pg (ref 25.1–34.0)
MCHC: 32.6 g/dL (ref 31.5–36.0)
MCV: 93.5 fL (ref 79.5–101.0)
MONOS PCT: 10 %
Monocytes Absolute: 0.5 10*3/uL (ref 0.1–0.9)
NEUTROS PCT: 81 %
Neutro Abs: 4.1 10*3/uL (ref 1.5–6.5)
Platelet Count: 197 10*3/uL (ref 145–400)
RBC: 3.41 MIL/uL — AB (ref 3.70–5.45)
RDW: 12.7 % (ref 11.2–14.5)
WBC: 5 10*3/uL (ref 3.9–10.3)

## 2018-07-04 LAB — TSH: TSH: 1.013 u[IU]/mL (ref 0.308–3.960)

## 2018-07-04 MED ORDER — SODIUM CHLORIDE 0.9% FLUSH
10.0000 mL | Freq: Once | INTRAVENOUS | Status: AC
  Administered 2018-07-04: 10 mL
  Filled 2018-07-04: qty 10

## 2018-07-04 MED ORDER — SODIUM CHLORIDE 0.9 % IV SOLN
Freq: Once | INTRAVENOUS | Status: AC
  Administered 2018-07-04: 12:00:00 via INTRAVENOUS
  Filled 2018-07-04: qty 250

## 2018-07-04 MED ORDER — SODIUM CHLORIDE 0.9 % IV SOLN
500.0000 mg | Freq: Once | INTRAVENOUS | Status: AC
  Administered 2018-07-04: 500 mg via INTRAVENOUS
  Filled 2018-07-04: qty 10

## 2018-07-04 MED ORDER — SODIUM CHLORIDE 0.9% FLUSH
10.0000 mL | INTRAVENOUS | Status: DC | PRN
  Administered 2018-07-04: 10 mL
  Filled 2018-07-04: qty 10

## 2018-07-04 MED ORDER — HEPARIN SOD (PORK) LOCK FLUSH 100 UNIT/ML IV SOLN
500.0000 [IU] | Freq: Once | INTRAVENOUS | Status: AC | PRN
  Administered 2018-07-04: 500 [IU]
  Filled 2018-07-04: qty 5

## 2018-07-04 NOTE — Patient Instructions (Signed)
[facility] Discharge Instructions for Patients Receiving Chemotherapy  Today you received the following chemotherapy agents: Imfinzi.  To help prevent nausea and vomiting after your treatment, we encourage you to take your nausea medication as directed.   If you develop nausea and vomiting that is not controlled by your nausea medication, call the clinic.   BELOW ARE SYMPTOMS THAT SHOULD BE REPORTED IMMEDIATELY:  *FEVER GREATER THAN 100.5 F  *CHILLS WITH OR WITHOUT FEVER  NAUSEA AND VOMITING THAT IS NOT CONTROLLED WITH YOUR NAUSEA MEDICATION  *UNUSUAL SHORTNESS OF BREATH  *UNUSUAL BRUISING OR BLEEDING  TENDERNESS IN MOUTH AND THROAT WITH OR WITHOUT PRESENCE OF ULCERS  *URINARY PROBLEMS  *BOWEL PROBLEMS  UNUSUAL RASH Items with * indicate a potential emergency and should be followed up as soon as possible.  Feel free to call the clinic should you have any questions or concerns. The clinic phone number is [phone].  Please show the CHEMO ALERT CARD at check-in to the Emergency Department and triage nurse.   

## 2018-07-04 NOTE — Progress Notes (Signed)
[place] OFFICE PROGRESS NOTE   Diagnosis: Non-small cell lung cancer  INTERVAL HISTORY:   Melissa Golden returns as scheduled. She completed another treatment with durvalumab on 06/20/2018.  She reports malaise for the past month.  She noted a "fever" and chills in the evening over the past few weeks.  She was evaluated by her primary physician in [place] on 07/01/2018 and diagnosed with "pneumonia ".  She was placed on azithromycin.  Her symptoms are partially improved. No rash or diarrhea.  No change in baseline cough or dyspnea. Objective:  Vital signs in last 24 hours:  Blood pressure 105/62, pulse 94, temperature 98.6 F (37 C), temperature source Oral, resp. rate 17, height '5\' 2"'$  (1.575 m), weight 113 lb 1.6 oz (51.3 kg), SpO2 95 %.    HEENT: No thrush or ulcers Resp: Distant breath sounds, inspiratory rhonchi at the bilateral upper anterior chest, no respiratory distress Cardio: Regular rate and rhythm GI: No hepatosplenomegaly Vascular: No leg edema  Skin: No rash  Portacath/PICC-without erythema  Lab Results:  Lab Results  Component Value Date   WBC 5.0 07/04/2018   HGB 10.4 (L) 07/04/2018   HCT 31.9 (L) 07/04/2018   MCV 93.5 07/04/2018   PLT 197 07/04/2018   NEUTROABS 4.1 07/04/2018    CMP  Lab Results  Component Value Date   NA 140 06/20/2018   K 4.0 06/20/2018   CL 102 06/20/2018   CO2 29 06/20/2018   GLUCOSE 100 (H) 06/20/2018   BUN 8 06/20/2018   CREATININE 0.59 06/20/2018   CALCIUM 9.3 06/20/2018   PROT 7.0 06/20/2018   ALBUMIN 3.2 (L) 06/20/2018   AST 15 06/20/2018   ALT 8 06/20/2018   ALKPHOS 79 06/20/2018   BILITOT 0.5 06/20/2018   GFRNONAA >60 06/20/2018   GFRAA >60 06/20/2018   Medications: I have reviewed the patient's current medications.   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  Status postlaparoscopic appendectomy 09/12/2017. Postoperative course complicated by an ileus requiring TPN.   Final pathology of the appendix-5 cm adenocarcinoma/goblet cell carcinoid. The tumor extended through the muscularis propria into subserosal soft tissue and mesoappendix and focally to the serosal surface. Lymphovascular space invasion identified. Perineural invasion identified. Associated acute appendicitis with perforation.  10/15/2017 CEA 1.  11/22/2017 chromogranin A-3  Status postlaparoscopic right hemicolectomy by Dr. Janace Litten 12/07/2017. No carcinomatosis or liver metastases were seen.   Final pathologyshowed metastatic adenocarcinoma/goblet cell carcinoid in 3 of 15 pericolic lymph nodes; maximum size of largest metastasis 0.2 cm; positive for extracapsular extension. Benign terminal ileum with fibrous serosal adhesions, negative for dysplasia or carcinoma; surgically absent appendix; appendectomy site with postsurgical changes and fibrous serosal adhesions, negative for carcinoma; proximal and distal mucosal margins negative for dysplasia or carcinoma; mesenteric resection margin negative for carcinoma.  Cycle 1 adjuvant Xeloda 01/07/2018  Cycle 2 adjuvant Xeloda 01/28/2018 2. Non-small cell lung cancer  PET [id number] hypermetabolic right upper lobe pulmonary nodules increased in size and metabolic activity. No mediastinal nodal metastasis. 4 mm right upper lobe nodule with metabolic activity, SUV 3.6. A third right upper lobe nodule measures 4 mm and has faint metabolic activity. No clear evidence of malignancy in the abdomen or pelvis. Mild activity along the ventral abdominal surgical scar. Scattered activity in small bowel without focal lesion on CT.  CT biopsy right upper lobe nodule 11/02/2017 showed non-small  cell carcinoma; malignant cells positive for TTF-1 and negative for p63. PD-L1 95%.  Chest CT  01/21/2018-new mediastinal adenopathy seen in the prevascular and right paratracheal stations. Largest lymph node is seen in the prevascular space measuring 2.0 cm, previously 6 mm. No hilar or axillary adenopathy. Right lung nodules are stable.  Biopsy of a right paratracheal lymph node on 02/04/2018-metastatic adenocarcinoma consistent with a lung primary  Chest radiation started 03/11/2018  Week 1 Taxol/carboplatin 03/13/2018  Week 2 Taxol/carboplatin 03/20/2018  Week 3 Taxol/carboplatin 03/27/2018  Week4Taxol/carboplatin 04/03/2018  Week 5 Taxol/carboplatin 04/10/2018  Week 6 Taxol/carboplatin 04/19/2018  Chest radiation completed 04/25/2018  CT chest 05/23/2018-interval decrease in size of mediastinal adenopathy. Near complete resolution of previously visualized right pleural effusion. The right lung nodules have decreased in size. Other nodules are stable.  Cycle 1 durvalumab 05/23/2018  Cycle 2durvalumab 06/06/2018  Cycle 3 durvalumab 06/20/2018  Cycle 4 durvalumab 07/04/2018   3. COPD  4.VATS drainage of a loculated right pleural effusion 03/01/2018  5.Port-A-Cath placement7/03/2018     Disposition: Melissa Golden has completed 3 treatments with durvalumab.  She has developed failure to thrive over the past several weeks.  Her symptoms could be related to COPD, malignancy, infection, or toxicity from therapy.  I have a low clinical suspicion for durvalumab related pneumonitis or pituitary insufficiency.  We checked a TSH today.  I also have a low clinical suspicion for radiation pneumonitis.  She will complete another treatment with durvalumab today.  We will follow-up on the chest x-ray report from [facility]  07/01/2018.  She will return for an office visit and durvalumab in 2 weeks.  She will contact us in the interim if her symptoms do not improve.  25 minutes were spent with the patient today.  The majority of the time was used for counseling and coordination of  care. Betsy Coder, MD  07/04/2018  11:01 AM

## 2018-07-14 ENCOUNTER — Other Ambulatory Visit: Payer: Self-pay | Admitting: Oncology

## 2018-07-18 ENCOUNTER — Other Ambulatory Visit: Payer: Self-pay

## 2018-07-18 DIAGNOSIS — C3411 Malignant neoplasm of upper lobe, right bronchus or lung: Secondary | ICD-10-CM

## 2018-07-19 ENCOUNTER — Inpatient Hospital Stay: Payer: Medicare Other

## 2018-07-19 ENCOUNTER — Inpatient Hospital Stay (HOSPITAL_BASED_OUTPATIENT_CLINIC_OR_DEPARTMENT_OTHER): Payer: Medicare Other | Admitting: Oncology

## 2018-07-19 VITALS — BP 146/83 | HR 95 | Temp 98.3°F | Resp 18 | Ht 62.0 in | Wt 117.3 lb

## 2018-07-19 DIAGNOSIS — J449 Chronic obstructive pulmonary disease, unspecified: Secondary | ICD-10-CM | POA: Diagnosis not present

## 2018-07-19 DIAGNOSIS — Z79899 Other long term (current) drug therapy: Secondary | ICD-10-CM

## 2018-07-19 DIAGNOSIS — M545 Low back pain: Secondary | ICD-10-CM | POA: Diagnosis not present

## 2018-07-19 DIAGNOSIS — Z85038 Personal history of other malignant neoplasm of large intestine: Secondary | ICD-10-CM | POA: Diagnosis not present

## 2018-07-19 DIAGNOSIS — C3411 Malignant neoplasm of upper lobe, right bronchus or lung: Secondary | ICD-10-CM

## 2018-07-19 DIAGNOSIS — R5381 Other malaise: Secondary | ICD-10-CM

## 2018-07-19 DIAGNOSIS — Z95828 Presence of other vascular implants and grafts: Secondary | ICD-10-CM

## 2018-07-19 DIAGNOSIS — R892 Abnormal level of other drugs, medicaments and biological substances in specimens from other organs, systems and tissues: Secondary | ICD-10-CM

## 2018-07-19 DIAGNOSIS — Z5112 Encounter for antineoplastic immunotherapy: Secondary | ICD-10-CM | POA: Diagnosis not present

## 2018-07-19 DIAGNOSIS — Z5181 Encounter for therapeutic drug level monitoring: Secondary | ICD-10-CM

## 2018-07-19 DIAGNOSIS — R0600 Dyspnea, unspecified: Secondary | ICD-10-CM

## 2018-07-19 DIAGNOSIS — Z923 Personal history of irradiation: Secondary | ICD-10-CM

## 2018-07-19 LAB — CMP (CANCER CENTER ONLY)
ALK PHOS: 77 U/L (ref 38–126)
ALT: 6 U/L (ref 0–44)
ANION GAP: 5 (ref 5–15)
AST: 12 U/L — ABNORMAL LOW (ref 15–41)
Albumin: 2.9 g/dL — ABNORMAL LOW (ref 3.5–5.0)
BUN: 5 mg/dL — ABNORMAL LOW (ref 8–23)
CALCIUM: 9.2 mg/dL (ref 8.9–10.3)
CO2: 32 mmol/L (ref 22–32)
CREATININE: 0.53 mg/dL (ref 0.44–1.00)
Chloride: 105 mmol/L (ref 98–111)
GFR, Est AFR Am: >60 mL/min (ref >60)
Glucose, Bld: 86 mg/dL (ref 70–99)
Potassium: 4.1 mmol/L (ref 3.5–5.1)
Sodium: 142 mmol/L (ref 135–145)
TOTAL PROTEIN: 6.2 g/dL — AB (ref 6.5–8.1)
Total Bilirubin: 0.3 mg/dL (ref 0.3–1.2)

## 2018-07-19 LAB — CBC WITH DIFFERENTIAL (CANCER CENTER ONLY)
BASOS ABS: 0 10*3/uL (ref 0.0–0.1)
BASOS PCT: 0 %
EOS ABS: 0 10*3/uL (ref 0.0–0.5)
Eosinophils Relative: 1 %
HEMATOCRIT: 33 % — AB (ref 34.8–46.6)
Hemoglobin: 10.5 g/dL — ABNORMAL LOW (ref 11.6–15.9)
Lymphocytes Relative: 10 %
Lymphs Abs: 0.4 10*3/uL — ABNORMAL LOW (ref 0.9–3.3)
MCH: 30.4 pg (ref 25.1–34.0)
MCHC: 31.8 g/dL (ref 31.5–36.0)
MCV: 95.7 fL (ref 79.5–101.0)
Monocytes Absolute: 0.5 10*3/uL (ref 0.1–0.9)
Monocytes Relative: 11 %
NEUTROS ABS: 3.6 10*3/uL (ref 1.5–6.5)
NEUTROS PCT: 78 %
Platelet Count: 157 10*3/uL (ref 145–400)
RBC: 3.45 MIL/uL — AB (ref 3.70–5.45)
RDW: 14.1 % (ref 11.2–14.5)
WBC: 4.6 10*3/uL (ref 3.9–10.3)

## 2018-07-19 MED ORDER — SODIUM CHLORIDE 0.9 % IV SOLN
Freq: Once | INTRAVENOUS | Status: AC
  Administered 2018-07-19: 13:00:00 via INTRAVENOUS
  Filled 2018-07-19: qty 250

## 2018-07-19 MED ORDER — SODIUM CHLORIDE 0.9% FLUSH
10.0000 mL | INTRAVENOUS | Status: DC | PRN
  Filled 2018-07-19: qty 10

## 2018-07-19 MED ORDER — SODIUM CHLORIDE 0.9 % IV SOLN
9.2000 mg/kg | Freq: Once | INTRAVENOUS | Status: AC
  Administered 2018-07-19: 500 mg via INTRAVENOUS
  Filled 2018-07-19: qty 10

## 2018-07-19 MED ORDER — SODIUM CHLORIDE 0.9% FLUSH
10.0000 mL | Freq: Once | INTRAVENOUS | Status: AC
  Administered 2018-07-19: 10 mL
  Filled 2018-07-19: qty 10

## 2018-07-19 MED ORDER — HEPARIN SOD (PORK) LOCK FLUSH 100 UNIT/ML IV SOLN
500.0000 [IU] | Freq: Once | INTRAVENOUS | Status: DC | PRN
  Filled 2018-07-19: qty 5

## 2018-07-19 NOTE — Progress Notes (Signed)
[place] OFFICE PROGRESS NOTE   Diagnosis: Non-small cell lung cancer  INTERVAL HISTORY:   Melissa Golden completed another treatment with durvalumab on 07/04/2018.  No rash or diarrhea.  No fever.  She feels like she may be getting a "cold ".  She feels much better compared to when she was treated with antibiotics earlier this month.  Objective:  Vital signs in last 24 hours:  Blood pressure (!) 146/83, pulse 95, temperature 98.3 F (36.8 C), temperature source Oral, resp. rate 18, height '5\' 2"'$  (1.575 m), weight 117 lb 4.8 oz (53.2 kg), SpO2 96 %.    HEENT: No thrush or ulcers Resp: Distant breath sounds with scattered faint inspiratory rhonchi, no respiratory distress Cardio: Regular rate and rhythm GI: No hepatosplenomegaly, nontender Vascular: No leg edema  Skin: Radiation hyperpigmentation at the right upper back, no rash  Portacath/PICC-without erythema  Lab Results:  Lab Results  Component Value Date   WBC 4.6 07/19/2018   HGB 10.5 (L) 07/19/2018   HCT 33.0 (L) 07/19/2018   MCV 95.7 07/19/2018   PLT 157 07/19/2018   NEUTROABS 3.6 07/19/2018    CMP  Lab Results  Component Value Date   NA 142 07/19/2018   K 4.1 07/19/2018   CL 105 07/19/2018   CO2 32 07/19/2018   GLUCOSE 86 07/19/2018   BUN 5 (L) 07/19/2018   CREATININE 0.53 07/19/2018   CALCIUM 9.2 07/19/2018   PROT 6.2 (L) 07/19/2018   ALBUMIN 2.9 (L) 07/19/2018   AST 12 (L) 07/19/2018   ALT 6 07/19/2018   ALKPHOS 77 07/19/2018   BILITOT 0.3 07/19/2018   GFRNONAA >60 07/19/2018   GFRAA >60 07/19/2018    Medications: I have reviewed the patient's current medications.   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  Status postlaparoscopic appendectomy 09/12/2017. Postoperative course complicated by an ileus requiring TPN.    Final pathology of the appendix-5 cm adenocarcinoma/goblet cell carcinoid. The tumor extended through the muscularis propria into subserosal soft tissue and mesoappendix and focally to the serosal surface. Lymphovascular space invasion identified. Perineural invasion identified. Associated acute appendicitis with perforation.  10/15/2017 CEA 1.  11/22/2017 chromogranin A-3  Status postlaparoscopic right hemicolectomy by Dr. Janace Litten 12/07/2017. No carcinomatosis or liver metastases were seen.   Final pathologyshowed metastatic adenocarcinoma/goblet cell carcinoid in 3 of 15 pericolic lymph nodes; maximum size of largest metastasis 0.2 cm; positive for extracapsular extension. Benign terminal ileum with fibrous serosal adhesions, negative for dysplasia or carcinoma; surgically absent appendix; appendectomy site with postsurgical changes and fibrous serosal adhesions, negative for carcinoma; proximal and distal mucosal margins negative for dysplasia or carcinoma; mesenteric resection margin negative for carcinoma.  Cycle 1 adjuvant Xeloda 01/07/2018  Cycle 2 adjuvant Xeloda 01/28/2018 2. Non-small cell lung cancer  PET [id number] hypermetabolic right upper lobe pulmonary nodules increased in size and metabolic activity. No mediastinal nodal metastasis. 4 mm right upper lobe nodule with metabolic activity, SUV 3.6. A third right upper lobe nodule measures 4 mm and has faint metabolic activity. No clear evidence of malignancy in the abdomen or pelvis. Mild activity along the ventral abdominal surgical scar. Scattered activity in small bowel without focal lesion on CT.  CT biopsy right upper lobe nodule 11/02/2017 showed non-small cell carcinoma; malignant cells positive for TTF-1 and negative for p63. PD-L1 95%.  Chest CT 01/21/2018-new mediastinal adenopathy seen in the prevascular and  right paratracheal stations. Largest lymph node is seen in the prevascular space  measuring 2.0 cm, previously 6 mm. No hilar or axillary adenopathy. Right lung nodules are stable.  Biopsy of a right paratracheal lymph node on 02/04/2018-metastatic adenocarcinoma consistent with a lung primary  Chest radiation started 03/11/2018  Week 1 Taxol/carboplatin 03/13/2018  Week 2 Taxol/carboplatin 03/20/2018  Week 3 Taxol/carboplatin 03/27/2018  Week4Taxol/carboplatin 04/03/2018  Week 5 Taxol/carboplatin 04/10/2018  Week 6 Taxol/carboplatin 04/19/2018  Chest radiation completed 04/25/2018  CT chest 05/23/2018-interval decrease in size of mediastinal adenopathy. Near complete resolution of previously visualized right pleural effusion. The right lung nodules have decreased in size. Other nodules are stable.  Cycle 1 durvalumab 05/23/2018  Cycle 2durvalumab 06/06/2018  Cycle 3 durvalumab 06/20/2018  Cycle 4 durvalumab 07/04/2018  Cycle 5 durvalumab 07/19/2018   3. COPD  4.VATS drainage of a loculated right pleural effusion 03/01/2018  5.Port-A-Cath placement7/03/2018   Disposition: Melissa Golden appears stable.  She is tolerating the durvalumab well.  She will complete another treatment today.  She will return for an office visit and durvalumab in 2 weeks.  She will be scheduled for a restaging chest CT in October.  15 minutes were spent with the patient today.  The majority of the time was used for counseling and coordination of care.  Betsy Coder, MD  07/19/2018  12:38 PM

## 2018-07-19 NOTE — Patient Instructions (Signed)

## 2018-07-19 NOTE — Patient Instructions (Signed)
[facility] Discharge Instructions for Patients Receiving Chemotherapy  Today you received the following chemotherapy agents: Imfinzi.  To help prevent nausea and vomiting after your treatment, we encourage you to take your nausea medication as directed.   If you develop nausea and vomiting that is not controlled by your nausea medication, call the clinic.   BELOW ARE SYMPTOMS THAT SHOULD BE REPORTED IMMEDIATELY:  *FEVER GREATER THAN 100.5 F  *CHILLS WITH OR WITHOUT FEVER  NAUSEA AND VOMITING THAT IS NOT CONTROLLED WITH YOUR NAUSEA MEDICATION  *UNUSUAL SHORTNESS OF BREATH  *UNUSUAL BRUISING OR BLEEDING  TENDERNESS IN MOUTH AND THROAT WITH OR WITHOUT PRESENCE OF ULCERS  *URINARY PROBLEMS  *BOWEL PROBLEMS  UNUSUAL RASH Items with * indicate a potential emergency and should be followed up as soon as possible.  Feel free to call the clinic should you have any questions or concerns. The clinic phone number is [phone].  Please show the CHEMO ALERT CARD at check-in to the Emergency Department and triage nurse.   

## 2018-07-22 ENCOUNTER — Telehealth: Payer: Self-pay | Admitting: Oncology

## 2018-07-22 NOTE — Telephone Encounter (Signed)
Scheduled appt per 8/23 los. - pt to get an updated schedule next visit.

## 2018-07-28 ENCOUNTER — Other Ambulatory Visit: Payer: Self-pay | Admitting: Oncology

## 2018-08-01 ENCOUNTER — Telehealth: Payer: Self-pay | Admitting: Oncology

## 2018-08-01 ENCOUNTER — Inpatient Hospital Stay (HOSPITAL_BASED_OUTPATIENT_CLINIC_OR_DEPARTMENT_OTHER): Payer: Medicare Other | Admitting: Oncology

## 2018-08-01 ENCOUNTER — Inpatient Hospital Stay: Payer: Medicare Other

## 2018-08-01 ENCOUNTER — Inpatient Hospital Stay: Payer: Medicare Other | Attending: Nurse Practitioner

## 2018-08-01 ENCOUNTER — Encounter: Payer: Self-pay | Admitting: Oncology

## 2018-08-01 VITALS — BP 143/69 | HR 93 | Temp 98.1°F | Resp 18 | Ht 62.0 in | Wt 115.2 lb

## 2018-08-01 DIAGNOSIS — Z85038 Personal history of other malignant neoplasm of large intestine: Secondary | ICD-10-CM | POA: Diagnosis not present

## 2018-08-01 DIAGNOSIS — Z923 Personal history of irradiation: Secondary | ICD-10-CM

## 2018-08-01 DIAGNOSIS — C3411 Malignant neoplasm of upper lobe, right bronchus or lung: Secondary | ICD-10-CM

## 2018-08-01 DIAGNOSIS — Z95828 Presence of other vascular implants and grafts: Secondary | ICD-10-CM

## 2018-08-01 DIAGNOSIS — J449 Chronic obstructive pulmonary disease, unspecified: Secondary | ICD-10-CM | POA: Diagnosis not present

## 2018-08-01 DIAGNOSIS — Z5112 Encounter for antineoplastic immunotherapy: Secondary | ICD-10-CM | POA: Insufficient documentation

## 2018-08-01 DIAGNOSIS — M545 Low back pain: Secondary | ICD-10-CM

## 2018-08-01 DIAGNOSIS — Z79899 Other long term (current) drug therapy: Secondary | ICD-10-CM | POA: Insufficient documentation

## 2018-08-01 DIAGNOSIS — R5381 Other malaise: Secondary | ICD-10-CM

## 2018-08-01 DIAGNOSIS — R0600 Dyspnea, unspecified: Secondary | ICD-10-CM

## 2018-08-01 LAB — CBC WITH DIFFERENTIAL (CANCER CENTER ONLY)
BASOS ABS: 0 10*3/uL (ref 0.0–0.1)
BASOS PCT: 0 %
EOS ABS: 0 10*3/uL (ref 0.0–0.5)
Eosinophils Relative: 0 %
HCT: 36.7 % (ref 34.8–46.6)
HEMOGLOBIN: 11.8 g/dL (ref 11.6–15.9)
Lymphocytes Relative: 10 %
Lymphs Abs: 0.5 10*3/uL — ABNORMAL LOW (ref 0.9–3.3)
MCH: 31.1 pg (ref 25.1–34.0)
MCHC: 32.2 g/dL (ref 31.5–36.0)
MCV: 96.8 fL (ref 79.5–101.0)
Monocytes Absolute: 0.5 10*3/uL (ref 0.1–0.9)
Monocytes Relative: 9 %
NEUTROS PCT: 81 %
Neutro Abs: 4 10*3/uL (ref 1.5–6.5)
Platelet Count: 184 10*3/uL (ref 145–400)
RBC: 3.79 MIL/uL (ref 3.70–5.45)
RDW: 14.7 % — ABNORMAL HIGH (ref 11.2–14.5)
WBC: 5 10*3/uL (ref 3.9–10.3)

## 2018-08-01 LAB — CMP (CANCER CENTER ONLY)
ALBUMIN: 3.4 g/dL — AB (ref 3.5–5.0)
ALK PHOS: 91 U/L (ref 38–126)
ALT: 8 U/L (ref 0–44)
AST: 14 U/L — AB (ref 15–41)
Anion gap: 10 (ref 5–15)
BUN: 5 mg/dL — ABNORMAL LOW (ref 8–23)
CALCIUM: 9.2 mg/dL (ref 8.9–10.3)
CO2: 28 mmol/L (ref 22–32)
Chloride: 104 mmol/L (ref 98–111)
Creatinine: 0.58 mg/dL (ref 0.44–1.00)
GFR, Est AFR Am: >60 mL/min (ref >60)
GFR, Estimated: >60 mL/min (ref >60)
GLUCOSE: 86 mg/dL (ref 70–99)
Potassium: 4.1 mmol/L (ref 3.5–5.1)
Sodium: 142 mmol/L (ref 135–145)
Total Bilirubin: 0.6 mg/dL (ref 0.3–1.2)
Total Protein: 6.9 g/dL (ref 6.5–8.1)

## 2018-08-01 LAB — TSH: TSH: 1.589 u[IU]/mL (ref 0.308–3.960)

## 2018-08-01 MED ORDER — SODIUM CHLORIDE 0.9% FLUSH
10.0000 mL | INTRAVENOUS | Status: DC | PRN
  Administered 2018-08-01: 10 mL
  Filled 2018-08-01: qty 10

## 2018-08-01 MED ORDER — SODIUM CHLORIDE 0.9 % IV SOLN
Freq: Once | INTRAVENOUS | Status: AC
  Administered 2018-08-01: 14:00:00 via INTRAVENOUS
  Filled 2018-08-01: qty 250

## 2018-08-01 MED ORDER — HEPARIN SOD (PORK) LOCK FLUSH 100 UNIT/ML IV SOLN
500.0000 [IU] | Freq: Once | INTRAVENOUS | Status: AC | PRN
  Administered 2018-08-01: 500 [IU]
  Filled 2018-08-01: qty 5

## 2018-08-01 MED ORDER — SODIUM CHLORIDE 0.9 % IV SOLN
500.0000 mg | Freq: Once | INTRAVENOUS | Status: AC
  Administered 2018-08-01: 500 mg via INTRAVENOUS
  Filled 2018-08-01: qty 10

## 2018-08-01 MED ORDER — SODIUM CHLORIDE 0.9% FLUSH
10.0000 mL | Freq: Once | INTRAVENOUS | Status: AC
  Administered 2018-08-01: 10 mL
  Filled 2018-08-01: qty 10

## 2018-08-01 NOTE — Telephone Encounter (Signed)
Scheduled appt per 9/5 los - pt to get an updated schedule next visit.

## 2018-08-01 NOTE — Progress Notes (Signed)
Per Dr. Benay Spice ok to treat with labs from 8/23.

## 2018-08-01 NOTE — Progress Notes (Signed)
[place] OFFICE PROGRESS NOTE   Diagnosis: Non-small cell lung cancer  INTERVAL HISTORY:  Melissa Golden completed another treatment with durvalumab on 07/19/2018.  No rash or diarrhea.  She reports she is doing well from a respiratory standpoint.  No complaint.  Objective:  Vital signs in last 24 hours:  Blood pressure (!) 143/69, pulse 93, temperature 98.1 F (36.7 C), temperature source Oral, resp. rate 18, height _0  (1.575 m), weight 115 lb 3.2 oz (52.3 kg), SpO2 100 %.    HEENT: No thrush or ulcers Resp: Distant breath sounds, no respiratory distress Cardio: Regular rate and rhythm GI: No hepatomegaly, nontender Vascular: No leg edema  Skin: Radiation hyperpigmentation at the posterior chest, no other rash  Portacath/PICC-without erythema  Lab Results:  Lab Results  Component Value Date   WBC 4.6 07/19/2018   HGB 10.5 (L) 07/19/2018   HCT 33.0 (L) 07/19/2018   MCV 95.7 07/19/2018   PLT 157 07/19/2018   NEUTROABS 3.6 07/19/2018    CMP  Lab Results  Component Value Date   NA 142 07/19/2018   K 4.1 07/19/2018   CL 105 07/19/2018   CO2 32 07/19/2018   GLUCOSE 86 07/19/2018   BUN 5 (L) 07/19/2018   CREATININE 0.53 07/19/2018   CALCIUM 9.2 07/19/2018   PROT 6.2 (L) 07/19/2018   ALBUMIN 2.9 (L) 07/19/2018   AST 12 (L) 07/19/2018   ALT 6 07/19/2018   ALKPHOS 77 07/19/2018   BILITOT 0.3 07/19/2018   GFRNONAA >60 07/19/2018   GFRAA >60 07/19/2018    Medications: I have reviewed the patient's current medications.   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  Status postlaparoscopic appendectomy 09/12/2017. Postoperative course complicated by an ileus requiring TPN.   Final pathology of the appendix-5 cm adenocarcinoma/goblet cell carcinoid. The tumor extended through the  muscularis propria into subserosal soft tissue and mesoappendix and focally to the serosal surface. Lymphovascular space invasion identified. Perineural invasion identified. Associated acute appendicitis with perforation.  10/15/2017 CEA 1.  11/22/2017 chromogranin A-3  Status postlaparoscopic right hemicolectomy by Dr. Janace Litten 12/07/2017. No carcinomatosis or liver metastases were seen.   Final pathologyshowed metastatic adenocarcinoma/goblet cell carcinoid in 3 of 15 pericolic lymph nodes; maximum size of largest metastasis 0.2 cm; positive for extracapsular extension. Benign terminal ileum with fibrous serosal adhesions, negative for dysplasia or carcinoma; surgically absent appendix; appendectomy site with postsurgical changes and fibrous serosal adhesions, negative for carcinoma; proximal and distal mucosal margins negative for dysplasia or carcinoma; mesenteric resection margin negative for carcinoma.  Cycle 1 adjuvant Xeloda 01/07/2018  Cycle 2 adjuvant Xeloda 01/28/2018 2. Non-small cell lung cancer  PET [id number] hypermetabolic right upper lobe pulmonary nodules increased in size and metabolic activity. No mediastinal nodal metastasis. 4 mm right upper lobe nodule with metabolic activity, SUV 3.6. A third right upper lobe nodule measures 4 mm and has faint metabolic activity. No clear evidence of malignancy in the abdomen or pelvis. Mild activity along the ventral abdominal surgical scar. Scattered activity in small bowel without focal lesion on CT.  CT biopsy right upper lobe nodule 11/02/2017 showed non-small cell carcinoma; malignant cells positive for TTF-1 and negative for p63. PD-L1 95%.  Chest CT 01/21/2018-new mediastinal adenopathy seen in the prevascular and right paratracheal stations. Largest lymph node is seen in the prevascular space measuring 2.0 cm, previously 6 mm. No hilar or axillary  adenopathy. Right lung nodules are stable.  Biopsy  of a right paratracheal lymph node on 02/04/2018-metastatic adenocarcinoma consistent with a lung primary  Chest radiation started 03/11/2018  Week 1 Taxol/carboplatin 03/13/2018  Week 2 Taxol/carboplatin 03/20/2018  Week 3 Taxol/carboplatin 03/27/2018  Week4Taxol/carboplatin 04/03/2018  Week 5 Taxol/carboplatin 04/10/2018  Week 6 Taxol/carboplatin 04/19/2018  Chest radiation completed 04/25/2018  CT chest 05/23/2018-interval decrease in size of mediastinal adenopathy. Near complete resolution of previously visualized right pleural effusion. The right lung nodules have decreased in size. Other nodules are stable.  Cycle 1 durvalumab 05/23/2018  Cycle 2durvalumab 06/06/2018  Cycle 3 durvalumab 06/20/2018  Cycle 4 durvalumab 07/04/2018  Cycle 5 durvalumab 07/19/2018  Cycle 6 durvalumab 08/01/2018   3. COPD  4.VATS drainage of a loculated right pleural effusion 03/01/2018  5.Port-A-Cath placement7/03/2018     Disposition: Melissa Golden appears well.  She is tolerating the durvalumab without apparent toxicity.  She will complete another cycle today.  She will return for an office visit and durvalumab in 2 weeks.  She will be scheduled for a restaging chest CT prior to treatment 09/05/2018. Betsy Coder, MD  08/01/2018  12:50 PM

## 2018-08-01 NOTE — Patient Instructions (Signed)
[facility] Discharge Instructions for Patients Receiving Chemotherapy  Today you received the following chemotherapy agents: Imfinzi.  To help prevent nausea and vomiting after your treatment, we encourage you to take your nausea medication as directed.   If you develop nausea and vomiting that is not controlled by your nausea medication, call the clinic.   BELOW ARE SYMPTOMS THAT SHOULD BE REPORTED IMMEDIATELY:  *FEVER GREATER THAN 100.5 F  *CHILLS WITH OR WITHOUT FEVER  NAUSEA AND VOMITING THAT IS NOT CONTROLLED WITH YOUR NAUSEA MEDICATION  *UNUSUAL SHORTNESS OF BREATH  *UNUSUAL BRUISING OR BLEEDING  TENDERNESS IN MOUTH AND THROAT WITH OR WITHOUT PRESENCE OF ULCERS  *URINARY PROBLEMS  *BOWEL PROBLEMS  UNUSUAL RASH Items with * indicate a potential emergency and should be followed up as soon as possible.  Feel free to call the clinic should you have any questions or concerns. The clinic phone number is [phone].  Please show the CHEMO ALERT CARD at check-in to the Emergency Department and triage nurse.   

## 2018-08-02 LAB — T4: T4, Total: 6.5 ug/dL (ref 4.5–12.0)

## 2018-08-10 ENCOUNTER — Other Ambulatory Visit: Payer: Self-pay | Admitting: Oncology

## 2018-08-15 ENCOUNTER — Inpatient Hospital Stay (HOSPITAL_BASED_OUTPATIENT_CLINIC_OR_DEPARTMENT_OTHER): Payer: Medicare Other | Admitting: Nurse Practitioner

## 2018-08-15 ENCOUNTER — Other Ambulatory Visit: Payer: Medicare Other

## 2018-08-15 ENCOUNTER — Encounter: Payer: Self-pay | Admitting: Emergency Medicine

## 2018-08-15 ENCOUNTER — Encounter: Payer: Self-pay | Admitting: Nurse Practitioner

## 2018-08-15 ENCOUNTER — Inpatient Hospital Stay: Payer: Medicare Other

## 2018-08-15 VITALS — BP 120/70 | HR 88 | Temp 98.7°F | Resp 18 | Ht 62.0 in | Wt 115.7 lb

## 2018-08-15 DIAGNOSIS — J449 Chronic obstructive pulmonary disease, unspecified: Secondary | ICD-10-CM

## 2018-08-15 DIAGNOSIS — Z923 Personal history of irradiation: Secondary | ICD-10-CM

## 2018-08-15 DIAGNOSIS — C3411 Malignant neoplasm of upper lobe, right bronchus or lung: Secondary | ICD-10-CM

## 2018-08-15 DIAGNOSIS — Z79899 Other long term (current) drug therapy: Secondary | ICD-10-CM | POA: Diagnosis not present

## 2018-08-15 DIAGNOSIS — Z5112 Encounter for antineoplastic immunotherapy: Secondary | ICD-10-CM | POA: Diagnosis not present

## 2018-08-15 DIAGNOSIS — Z85038 Personal history of other malignant neoplasm of large intestine: Secondary | ICD-10-CM | POA: Diagnosis not present

## 2018-08-15 DIAGNOSIS — M545 Low back pain: Secondary | ICD-10-CM | POA: Diagnosis not present

## 2018-08-15 DIAGNOSIS — R5381 Other malaise: Secondary | ICD-10-CM

## 2018-08-15 MED ORDER — SODIUM CHLORIDE 0.9 % IV SOLN
Freq: Once | INTRAVENOUS | Status: AC
  Administered 2018-08-15: 14:00:00 via INTRAVENOUS
  Filled 2018-08-15: qty 250

## 2018-08-15 MED ORDER — SODIUM CHLORIDE 0.9 % IV SOLN
9.3000 mg/kg | Freq: Once | INTRAVENOUS | Status: AC
  Administered 2018-08-15: 500 mg via INTRAVENOUS
  Filled 2018-08-15: qty 10

## 2018-08-15 MED ORDER — SODIUM CHLORIDE 0.9% FLUSH
10.0000 mL | INTRAVENOUS | Status: DC | PRN
  Administered 2018-08-15: 10 mL
  Filled 2018-08-15: qty 10

## 2018-08-15 MED ORDER — HEPARIN SOD (PORK) LOCK FLUSH 100 UNIT/ML IV SOLN
500.0000 [IU] | Freq: Once | INTRAVENOUS | Status: AC | PRN
  Administered 2018-08-15: 500 [IU]
  Filled 2018-08-15: qty 5

## 2018-08-15 NOTE — Patient Instructions (Signed)
[facility] Discharge Instructions for Patients Receiving Chemotherapy  Today you received the following chemotherapy agents: Imfinzi.  To help prevent nausea and vomiting after your treatment, we encourage you to take your nausea medication as directed.   If you develop nausea and vomiting that is not controlled by your nausea medication, call the clinic.   BELOW ARE SYMPTOMS THAT SHOULD BE REPORTED IMMEDIATELY:  *FEVER GREATER THAN 100.5 F  *CHILLS WITH OR WITHOUT FEVER  NAUSEA AND VOMITING THAT IS NOT CONTROLLED WITH YOUR NAUSEA MEDICATION  *UNUSUAL SHORTNESS OF BREATH  *UNUSUAL BRUISING OR BLEEDING  TENDERNESS IN MOUTH AND THROAT WITH OR WITHOUT PRESENCE OF ULCERS  *URINARY PROBLEMS  *BOWEL PROBLEMS  UNUSUAL RASH Items with * indicate a potential emergency and should be followed up as soon as possible.  Feel free to call the clinic should you have any questions or concerns. The clinic phone number is [phone].  Please show the CHEMO ALERT CARD at check-in to the Emergency Department and triage nurse.   

## 2018-08-15 NOTE — Progress Notes (Signed)
Ok to tx today w/ no labs per Ned Card, NP.

## 2018-08-15 NOTE — Progress Notes (Signed)
[facility] OFFICE PROGRESS NOTE   Diagnosis: Non-small cell lung cancer  INTERVAL HISTORY:   Ms. Kennyth Arnold returns as scheduled.  She completed another treatment with durvalumab on 08/01/2018.  She feels well.  She denies nausea/vomiting.  No mouth sores.  No diarrhea.  No rash.  No shortness of breath at rest.  She utilizes supplemental oxygen at 2 L with activity.  No pain at present.  Good appetite.  Objective:  Vital signs in last 24 hours:  Blood pressure 120/70, pulse 88, temperature 98.7 F (37.1 C), temperature source Oral, resp. rate 18, height '5\' 2"'$  (1.575 m), weight 115 lb 11.2 oz (52.5 kg), SpO2 100 %.    HEENT: No thrush or ulcers. Resp: Lungs clear bilaterally. Cardio: Regular rate and rhythm. GI: Abdomen soft and nontender.  No hepatomegaly. Vascular: No leg edema. Neuro: Alert and oriented. Skin: No rash. Port-A-Cath without erythema.   Lab Results:  Lab Results  Component Value Date   WBC 5.0 08/01/2018   HGB 11.8 08/01/2018   HCT 36.7 08/01/2018   MCV 96.8 08/01/2018   PLT 184 08/01/2018   NEUTROABS 4.0 08/01/2018    Imaging:  No results found.  Medications: I have reviewed the patient's current medications.  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  Status postlaparoscopic appendectomy 09/12/2017. Postoperative course complicated by an ileus requiring TPN.   Final pathology of the appendix-5 cm adenocarcinoma/goblet cell carcinoid. The tumor extended through the muscularis propria into subserosal soft tissue and mesoappendix and focally to the serosal surface. Lymphovascular space invasion identified. Perineural invasion identified. Associated acute appendicitis with perforation.  10/15/2017 CEA 1.  11/22/2017 chromogranin A-3  Status postlaparoscopic right  hemicolectomy by Dr. Janace Litten 12/07/2017. No carcinomatosis or liver metastases were seen.   Final pathologyshowed metastatic adenocarcinoma/goblet cell carcinoid in 3 of 15 pericolic lymph nodes; maximum size of largest metastasis 0.2 cm; positive for extracapsular extension. Benign terminal ileum with fibrous serosal adhesions, negative for dysplasia or carcinoma; surgically absent appendix; appendectomy site with postsurgical changes and fibrous serosal adhesions, negative for carcinoma; proximal and distal mucosal margins negative for dysplasia or carcinoma; mesenteric resection margin negative for carcinoma.  Cycle 1 adjuvant Xeloda 01/07/2018  Cycle 2 adjuvant Xeloda 01/28/2018 2. Non-small cell lung cancer  PET [id number]/[id number] hypermetabolic right upper lobe pulmonary nodules increased in size and metabolic activity. No mediastinal nodal metastasis. 4 mm right upper lobe nodule with metabolic activity, SUV 3.6. A third right upper lobe nodule measures 4 mm and has faint metabolic activity. No clear evidence of malignancy in the abdomen or pelvis. Mild activity along the ventral abdominal surgical scar. Scattered activity in small bowel without focal lesion on CT.  CT biopsy right upper lobe nodule 11/02/2017 showed non-small cell carcinoma; malignant cells positive for TTF-1 and negative for p63. PD-L1 95%.  Chest CT 01/21/2018-new mediastinal adenopathy seen in the prevascular and right paratracheal stations. Largest lymph node is seen in the prevascular space measuring 2.0 cm, previously 6 mm. No hilar or axillary adenopathy. Right lung nodules are stable.  Biopsy of a right paratracheal lymph node on 02/04/2018-metastatic adenocarcinoma consistent with a lung primary  Chest radiation started 03/11/2018  Week 1 Taxol/carboplatin 03/13/2018  Week 2 Taxol/carboplatin 03/20/2018  Week 3 Taxol/carboplatin 03/27/2018  Week4Taxol/carboplatin 04/03/2018  Week 5  Taxol/carboplatin 04/10/2018  Week 6 Taxol/carboplatin 04/19/2018  Chest radiation completed 04/25/2018  CT chest 05/23/2018-interval  decrease in size of mediastinal adenopathy. Near complete resolution of previously visualized right pleural effusion. The right lung nodules have decreased in size. Other nodules are stable.  Cycle 1 durvalumab 05/23/2018  Cycle 2durvalumab 06/06/2018  Cycle 3 durvalumab 06/20/2018  Cycle 4 durvalumab 07/04/2018  Cycle 5 durvalumab 07/19/2018  Cycle 6 durvalumab 08/01/2018  Cycle 7 durvalumab 08/15/2018   3. COPD  4.VATS drainage of a loculated right pleural effusion 03/01/2018  5.Port-A-Cath placement7/03/2018   Disposition: Ms. Kennyth Arnold appears stable.  She has completed 6 cycles of durvalumab.  Plan to proceed with cycle 7 today as scheduled.  The plan is for a restaging chest CT 09/03/2018.  She will return for a follow-up visit on 09/04/2018.  She will contact the office in the interim with any problems.    Ned Card ANP/GNP-BC   08/15/2018  12:10 PM

## 2018-08-28 DIAGNOSIS — C3411 Malignant neoplasm of upper lobe, right bronchus or lung: Secondary | ICD-10-CM | POA: Diagnosis not present

## 2018-08-28 DIAGNOSIS — Z6820 Body mass index (BMI) 20.0-20.9, adult: Secondary | ICD-10-CM | POA: Diagnosis not present

## 2018-08-28 DIAGNOSIS — Z87891 Personal history of nicotine dependence: Secondary | ICD-10-CM | POA: Diagnosis not present

## 2018-08-28 DIAGNOSIS — N959 Unspecified menopausal and perimenopausal disorder: Secondary | ICD-10-CM | POA: Diagnosis not present

## 2018-08-28 DIAGNOSIS — Z23 Encounter for immunization: Secondary | ICD-10-CM | POA: Diagnosis not present

## 2018-08-28 DIAGNOSIS — F419 Anxiety disorder, unspecified: Secondary | ICD-10-CM | POA: Diagnosis not present

## 2018-08-28 DIAGNOSIS — J449 Chronic obstructive pulmonary disease, unspecified: Secondary | ICD-10-CM | POA: Diagnosis not present

## 2018-08-31 ENCOUNTER — Other Ambulatory Visit: Payer: Self-pay | Admitting: Oncology

## 2018-09-03 ENCOUNTER — Encounter (HOSPITAL_COMMUNITY): Payer: Self-pay

## 2018-09-03 ENCOUNTER — Inpatient Hospital Stay: Payer: Medicare Other | Attending: Nurse Practitioner

## 2018-09-03 ENCOUNTER — Ambulatory Visit (HOSPITAL_COMMUNITY)
Admission: RE | Admit: 2018-09-03 | Discharge: 2018-09-03 | Disposition: A | Discharge status: Home / Self Care | Payer: Medicare Other | Source: Ambulatory Visit | Attending: Oncology | Admitting: Oncology

## 2018-09-03 ENCOUNTER — Inpatient Hospital Stay: Payer: Medicare Other

## 2018-09-03 DIAGNOSIS — J479 Bronchiectasis, uncomplicated: Secondary | ICD-10-CM | POA: Insufficient documentation

## 2018-09-03 DIAGNOSIS — J449 Chronic obstructive pulmonary disease, unspecified: Secondary | ICD-10-CM | POA: Diagnosis not present

## 2018-09-03 DIAGNOSIS — M545 Low back pain: Secondary | ICD-10-CM | POA: Insufficient documentation

## 2018-09-03 DIAGNOSIS — Z79899 Other long term (current) drug therapy: Secondary | ICD-10-CM | POA: Diagnosis not present

## 2018-09-03 DIAGNOSIS — C3490 Malignant neoplasm of unspecified part of unspecified bronchus or lung: Secondary | ICD-10-CM | POA: Diagnosis not present

## 2018-09-03 DIAGNOSIS — Z85038 Personal history of other malignant neoplasm of large intestine: Secondary | ICD-10-CM | POA: Insufficient documentation

## 2018-09-03 DIAGNOSIS — R5381 Other malaise: Secondary | ICD-10-CM | POA: Insufficient documentation

## 2018-09-03 DIAGNOSIS — J439 Emphysema, unspecified: Secondary | ICD-10-CM | POA: Diagnosis not present

## 2018-09-03 DIAGNOSIS — Z923 Personal history of irradiation: Secondary | ICD-10-CM | POA: Insufficient documentation

## 2018-09-03 DIAGNOSIS — C3411 Malignant neoplasm of upper lobe, right bronchus or lung: Secondary | ICD-10-CM

## 2018-09-03 DIAGNOSIS — I2510 Atherosclerotic heart disease of native coronary artery without angina pectoris: Secondary | ICD-10-CM | POA: Diagnosis not present

## 2018-09-03 DIAGNOSIS — Z95828 Presence of other vascular implants and grafts: Secondary | ICD-10-CM

## 2018-09-03 DIAGNOSIS — Z5112 Encounter for antineoplastic immunotherapy: Secondary | ICD-10-CM | POA: Diagnosis not present

## 2018-09-03 DIAGNOSIS — I700 Atherosclerosis of aorta: Secondary | ICD-10-CM | POA: Diagnosis not present

## 2018-09-03 LAB — CMP (CANCER CENTER ONLY)
ALBUMIN: 3.6 g/dL (ref 3.5–5.0)
ALT: 9 U/L (ref 0–44)
ANION GAP: 8 (ref 5–15)
AST: 15 U/L (ref 15–41)
Alkaline Phosphatase: 76 U/L (ref 38–126)
BUN: 7 mg/dL — ABNORMAL LOW (ref 8–23)
CO2: 29 mmol/L (ref 22–32)
Calcium: 9.3 mg/dL (ref 8.9–10.3)
Chloride: 104 mmol/L (ref 98–111)
Creatinine: 0.62 mg/dL (ref 0.44–1.00)
GFR, Est AFR Am: >60 mL/min (ref >60)
GFR, Estimated: >60 mL/min (ref >60)
GLUCOSE: 96 mg/dL (ref 70–99)
POTASSIUM: 4.1 mmol/L (ref 3.5–5.1)
Sodium: 141 mmol/L (ref 135–145)
Total Bilirubin: 0.4 mg/dL (ref 0.3–1.2)
Total Protein: 7.2 g/dL (ref 6.5–8.1)

## 2018-09-03 LAB — CBC WITH DIFFERENTIAL (CANCER CENTER ONLY)
Abs Immature Granulocytes: 0.02 10*3/uL (ref 0.00–0.07)
BASOS ABS: 0 10*3/uL (ref 0.0–0.1)
Basophils Relative: 1 %
EOS ABS: 0 10*3/uL (ref 0.0–0.5)
EOS PCT: 1 %
HCT: 38.9 % (ref 36.0–46.0)
HEMOGLOBIN: 12.6 g/dL (ref 12.0–15.0)
IMMATURE GRANULOCYTES: 0 %
LYMPHS PCT: 13 %
Lymphs Abs: 0.7 10*3/uL (ref 0.7–4.0)
MCH: 31 pg (ref 26.0–34.0)
MCHC: 32.4 g/dL (ref 30.0–36.0)
MCV: 95.8 fL (ref 80.0–100.0)
Monocytes Absolute: 0.5 10*3/uL (ref 0.1–1.0)
Monocytes Relative: 8 %
NEUTROS PCT: 77 %
NRBC: 0 % (ref 0.0–0.2)
Neutro Abs: 4.5 10*3/uL (ref 1.7–7.7)
Platelet Count: 172 10*3/uL (ref 150–400)
RBC: 4.06 MIL/uL (ref 3.87–5.11)
RDW: 14.5 % (ref 11.5–15.5)
WBC: 5.7 10*3/uL (ref 4.0–10.5)

## 2018-09-03 MED ORDER — HEPARIN SOD (PORK) LOCK FLUSH 100 UNIT/ML IV SOLN
INTRAVENOUS | Status: AC
  Filled 2018-09-03: qty 5

## 2018-09-03 MED ORDER — SODIUM CHLORIDE 0.9% FLUSH
10.0000 mL | Freq: Once | INTRAVENOUS | Status: AC
  Administered 2018-09-03: 10 mL
  Filled 2018-09-03: qty 10

## 2018-09-03 MED ORDER — IOHEXOL 300 MG/ML  SOLN
75.0000 mL | Freq: Once | INTRAMUSCULAR | Status: AC | PRN
  Administered 2018-09-03: 75 mL via INTRAVENOUS

## 2018-09-03 MED ORDER — SODIUM CHLORIDE 0.9 % IJ SOLN
INTRAMUSCULAR | Status: AC
  Filled 2018-09-03: qty 50

## 2018-09-03 MED ORDER — HEPARIN SOD (PORK) LOCK FLUSH 100 UNIT/ML IV SOLN
500.0000 [IU] | Freq: Once | INTRAVENOUS | Status: AC
  Administered 2018-09-03: 500 [IU] via INTRAVENOUS

## 2018-09-04 ENCOUNTER — Inpatient Hospital Stay: Payer: Medicare Other

## 2018-09-04 ENCOUNTER — Encounter: Payer: Self-pay | Admitting: Nurse Practitioner

## 2018-09-04 ENCOUNTER — Inpatient Hospital Stay (HOSPITAL_BASED_OUTPATIENT_CLINIC_OR_DEPARTMENT_OTHER): Payer: Medicare Other | Admitting: Nurse Practitioner

## 2018-09-04 VITALS — BP 109/65 | HR 95 | Temp 97.4°F | Resp 18 | Ht 62.0 in | Wt 117.7 lb

## 2018-09-04 DIAGNOSIS — Z79899 Other long term (current) drug therapy: Secondary | ICD-10-CM | POA: Diagnosis not present

## 2018-09-04 DIAGNOSIS — C3411 Malignant neoplasm of upper lobe, right bronchus or lung: Secondary | ICD-10-CM | POA: Diagnosis not present

## 2018-09-04 DIAGNOSIS — R5381 Other malaise: Secondary | ICD-10-CM

## 2018-09-04 DIAGNOSIS — R0600 Dyspnea, unspecified: Secondary | ICD-10-CM

## 2018-09-04 DIAGNOSIS — Z923 Personal history of irradiation: Secondary | ICD-10-CM

## 2018-09-04 DIAGNOSIS — J449 Chronic obstructive pulmonary disease, unspecified: Secondary | ICD-10-CM | POA: Diagnosis not present

## 2018-09-04 DIAGNOSIS — Z85038 Personal history of other malignant neoplasm of large intestine: Secondary | ICD-10-CM

## 2018-09-04 DIAGNOSIS — M545 Low back pain: Secondary | ICD-10-CM

## 2018-09-04 DIAGNOSIS — R892 Abnormal level of other drugs, medicaments and biological substances in specimens from other organs, systems and tissues: Secondary | ICD-10-CM

## 2018-09-04 DIAGNOSIS — Z5112 Encounter for antineoplastic immunotherapy: Secondary | ICD-10-CM | POA: Diagnosis not present

## 2018-09-04 DIAGNOSIS — Z5181 Encounter for therapeutic drug level monitoring: Secondary | ICD-10-CM

## 2018-09-04 MED ORDER — LIDOCAINE-PRILOCAINE 2.5-2.5 % EX CREA: TOPICAL_CREAM | CUTANEOUS | 1 refills | Status: DC

## 2018-09-04 MED ORDER — SODIUM CHLORIDE 0.9% FLUSH
10.0000 mL | INTRAVENOUS | Status: DC | PRN
  Administered 2018-09-04: 10 mL
  Filled 2018-09-04: qty 10

## 2018-09-04 MED ORDER — SODIUM CHLORIDE 0.9 % IV SOLN
Freq: Once | INTRAVENOUS | Status: AC
  Administered 2018-09-04: 11:00:00 via INTRAVENOUS
  Filled 2018-09-04: qty 250

## 2018-09-04 MED ORDER — SODIUM CHLORIDE 0.9 % IV SOLN
9.3000 mg/kg | Freq: Once | INTRAVENOUS | Status: AC
  Administered 2018-09-04: 500 mg via INTRAVENOUS
  Filled 2018-09-04: qty 10

## 2018-09-04 MED ORDER — HEPARIN SOD (PORK) LOCK FLUSH 100 UNIT/ML IV SOLN
500.0000 [IU] | Freq: Once | INTRAVENOUS | Status: AC | PRN
  Administered 2018-09-04: 500 [IU]
  Filled 2018-09-04: qty 5

## 2018-09-04 NOTE — Patient Instructions (Signed)
[facility] Discharge Instructions for Patients Receiving Chemotherapy  Today you received the following chemotherapy agents: Imfinzi.  To help prevent nausea and vomiting after your treatment, we encourage you to take your nausea medication as directed.   If you develop nausea and vomiting that is not controlled by your nausea medication, call the clinic.   BELOW ARE SYMPTOMS THAT SHOULD BE REPORTED IMMEDIATELY:  *FEVER GREATER THAN 100.5 F  *CHILLS WITH OR WITHOUT FEVER  NAUSEA AND VOMITING THAT IS NOT CONTROLLED WITH YOUR NAUSEA MEDICATION  *UNUSUAL SHORTNESS OF BREATH  *UNUSUAL BRUISING OR BLEEDING  TENDERNESS IN MOUTH AND THROAT WITH OR WITHOUT PRESENCE OF ULCERS  *URINARY PROBLEMS  *BOWEL PROBLEMS  UNUSUAL RASH Items with * indicate a potential emergency and should be followed up as soon as possible.  Feel free to call the clinic should you have any questions or concerns. The clinic phone number is [phone].  Please show the CHEMO ALERT CARD at check-in to the Emergency Department and triage nurse.   

## 2018-09-04 NOTE — Progress Notes (Addendum)
[facility] OFFICE PROGRESS NOTE   Diagnosis: Non-small cell lung cancer  INTERVAL HISTORY:   Melissa Golden returns as scheduled.  She completed cycle 7 Durvalumab 08/15/2018.  She denies nausea/vomiting.  No mouth sores.  No diarrhea.  No rash.  She has stable dyspnea on exertion, periodic cough.  No fever.  Back pain is better.  She has a good appetite.  Objective:  Vital signs in last 24 hours:  Blood pressure 109/65, pulse 95, temperature (!) 97.4 F (36.3 C), temperature source Oral, resp. rate 18, height _0  (1.575 m), weight 117 lb 11.2 oz (53.4 kg), SpO2 97 %.    HEENT: No thrush or ulcers. Resp: Lungs clear bilaterally. Cardio: Regular rate and rhythm. GI: Abdomen soft and nontender.  No hepatomegaly. Vascular: No leg edema. Neuro: Alert and oriented. Skin: No rash. Port-A-Cath without erythema.   Lab Results:  Lab Results  Component Value Date   WBC 5.7 09/03/2018   HGB 12.6 09/03/2018   HCT 38.9 09/03/2018   MCV 95.8 09/03/2018   PLT 172 09/03/2018   NEUTROABS 4.5 09/03/2018    Imaging:  No results found.  Medications: I have reviewed the patient's current medications.  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  Status postlaparoscopic appendectomy 09/12/2017. Postoperative course complicated by an ileus requiring TPN.   Final pathology of the appendix-5 cm adenocarcinoma/goblet cell carcinoid. The tumor extended through the muscularis propria into subserosal soft tissue and mesoappendix and focally to the serosal surface. Lymphovascular space invasion identified. Perineural invasion identified. Associated acute appendicitis with perforation.  10/15/2017 CEA 1.  11/22/2017 chromogranin A-3  Status postlaparoscopic right hemicolectomy by Dr. Janace Litten  12/07/2017. No carcinomatosis or liver metastases were seen.   Final pathologyshowed metastatic adenocarcinoma/goblet cell carcinoid in 3 of 15 pericolic lymph nodes; maximum size of largest metastasis 0.2 cm; positive for extracapsular extension. Benign terminal ileum with fibrous serosal adhesions, negative for dysplasia or carcinoma; surgically absent appendix; appendectomy site with postsurgical changes and fibrous serosal adhesions, negative for carcinoma; proximal and distal mucosal margins negative for dysplasia or carcinoma; mesenteric resection margin negative for carcinoma.  Cycle 1 adjuvant Xeloda 01/07/2018  Cycle 2 adjuvant Xeloda 01/28/2018 2. Non-small cell lung cancer  PET [id number]/63/[id number] hypermetabolic right upper lobe pulmonary nodules increased in size and metabolic activity. No mediastinal nodal metastasis. 4 mm right upper lobe nodule with metabolic activity, SUV 3.6. A third right upper lobe nodule measures 4 mm and has faint metabolic activity. No clear evidence of malignancy in the abdomen or pelvis. Mild activity along the ventral abdominal surgical scar. Scattered activity in small bowel without focal lesion on CT.  CT biopsy right upper lobe nodule 11/02/2017 showed non-small cell carcinoma; malignant cells positive for TTF-1 and negative for p63. PD-L1 95%.  Chest CT 01/21/2018-new mediastinal adenopathy seen in the prevascular and right paratracheal stations. Largest lymph node is seen in the prevascular space measuring 2.0 cm, previously 6 mm. No hilar or axillary adenopathy. Right lung nodules are stable.  Biopsy of a right paratracheal lymph node on 02/04/2018-metastatic adenocarcinoma consistent with a lung primary  Chest radiation started 03/11/2018  Week 1 Taxol/carboplatin 03/13/2018  Week 2 Taxol/carboplatin 03/20/2018  Week 3 Taxol/carboplatin 03/27/2018  Week4Taxol/carboplatin 04/03/2018  Week 5 Taxol/carboplatin 04/10/2018  Week 6  Taxol/carboplatin 04/19/2018  Chest radiation completed 04/25/2018  CT chest 05/23/2018-interval decrease in size of mediastinal adenopathy. Near  complete resolution of previously visualized right pleural effusion. The right lung nodules have decreased in size. Other nodules are stable.  Cycle 1 durvalumab 05/23/2018  Cycle 2durvalumab 06/06/2018  Cycle 3 durvalumab 06/20/2018  Cycle 4 durvalumab 07/04/2018  Cycle 5 durvalumab 07/19/2018  Cycle 6 durvalumab 08/01/2018  Cycle 7 durvalumab 08/15/2018  CT chest 09/03/2018- collapse/consolidation with bronchiectasis and surrounding groundglass involving primarily the right upper lobe, findings most indicative of recent radiation therapy.  Previously measured right upper lobe nodule obscured.  New areas of nodularity in the right lower lobe, likely infectious or inflammatory.  Cycle 8 durvalumab 09/04/2018   3. COPD  4.VATS drainage of a loculated right pleural effusion 03/01/2018  5.Port-A-Cath placement7/03/2018   Disposition: Melissa Golden appears stable.  She has completed 7 cycles of durvalumab.  The recent restaging chest CT shows changes most likely related to radiation; new areas of nodularity in the right lower lobe felt to likely be infectious or inflammatory.  Plan to continue every 2-week durvalumab.  She will receive cycle 8 today.  She will return for lab, follow-up and cycle 9 durvalumab in 2 weeks.  She will contact the office in the interim with any problems.  Patient seen with Dr. Benay Spice.    Ned Card ANP/GNP-BC   09/04/2018  9:44 AM  This was a shared visit with Ned Card.  Melissa Golden appears well.  The restaging CT reveals no evidence of disease progression.  The consolidative changes in the right lung are likely secondary to radiation.  She will continue durvalumab.  Melissa Manson, MD

## 2018-09-05 ENCOUNTER — Telehealth: Payer: Self-pay | Admitting: Nurse Practitioner

## 2018-09-05 NOTE — Telephone Encounter (Signed)
Scheduled appt per 10/9 los - pt to get an updated schedule next visit.

## 2018-09-14 ENCOUNTER — Other Ambulatory Visit: Payer: Self-pay | Admitting: Oncology

## 2018-09-18 ENCOUNTER — Inpatient Hospital Stay: Payer: Medicare Other

## 2018-09-18 ENCOUNTER — Inpatient Hospital Stay (HOSPITAL_BASED_OUTPATIENT_CLINIC_OR_DEPARTMENT_OTHER): Payer: Medicare Other | Admitting: Oncology

## 2018-09-18 VITALS — BP 126/66 | HR 90 | Temp 98.2°F | Resp 18 | Ht 62.0 in | Wt 119.1 lb

## 2018-09-18 DIAGNOSIS — C3411 Malignant neoplasm of upper lobe, right bronchus or lung: Secondary | ICD-10-CM

## 2018-09-18 DIAGNOSIS — Z923 Personal history of irradiation: Secondary | ICD-10-CM

## 2018-09-18 DIAGNOSIS — R5381 Other malaise: Secondary | ICD-10-CM | POA: Diagnosis not present

## 2018-09-18 DIAGNOSIS — Z5181 Encounter for therapeutic drug level monitoring: Secondary | ICD-10-CM

## 2018-09-18 DIAGNOSIS — Z79899 Other long term (current) drug therapy: Secondary | ICD-10-CM | POA: Diagnosis not present

## 2018-09-18 DIAGNOSIS — R892 Abnormal level of other drugs, medicaments and biological substances in specimens from other organs, systems and tissues: Secondary | ICD-10-CM

## 2018-09-18 DIAGNOSIS — R0600 Dyspnea, unspecified: Secondary | ICD-10-CM

## 2018-09-18 DIAGNOSIS — J449 Chronic obstructive pulmonary disease, unspecified: Secondary | ICD-10-CM

## 2018-09-18 DIAGNOSIS — Z85038 Personal history of other malignant neoplasm of large intestine: Secondary | ICD-10-CM | POA: Diagnosis not present

## 2018-09-18 DIAGNOSIS — M545 Low back pain: Secondary | ICD-10-CM

## 2018-09-18 DIAGNOSIS — Z5112 Encounter for antineoplastic immunotherapy: Secondary | ICD-10-CM | POA: Diagnosis not present

## 2018-09-18 LAB — CMP (CANCER CENTER ONLY)
ALBUMIN: 3.5 g/dL (ref 3.5–5.0)
ALK PHOS: 77 U/L (ref 38–126)
ALT: 9 U/L (ref 0–44)
ANION GAP: 9 (ref 5–15)
AST: 15 U/L (ref 15–41)
BUN: 6 mg/dL — ABNORMAL LOW (ref 8–23)
CALCIUM: 9.1 mg/dL (ref 8.9–10.3)
CO2: 27 mmol/L (ref 22–32)
Chloride: 105 mmol/L (ref 98–111)
Creatinine: 0.6 mg/dL (ref 0.44–1.00)
GFR, Estimated: >60 mL/min (ref >60)
GLUCOSE: 87 mg/dL (ref 70–99)
POTASSIUM: 4.1 mmol/L (ref 3.5–5.1)
SODIUM: 141 mmol/L (ref 135–145)
Total Bilirubin: 0.3 mg/dL (ref 0.3–1.2)
Total Protein: 6.9 g/dL (ref 6.5–8.1)

## 2018-09-18 LAB — CBC WITH DIFFERENTIAL (CANCER CENTER ONLY)
Abs Immature Granulocytes: 0.01 10*3/uL (ref 0.00–0.07)
BASOS ABS: 0 10*3/uL (ref 0.0–0.1)
BASOS PCT: 1 %
EOS ABS: 0.1 10*3/uL (ref 0.0–0.5)
EOS PCT: 1 %
HCT: 37.4 % (ref 36.0–46.0)
HEMOGLOBIN: 12.2 g/dL (ref 12.0–15.0)
Immature Granulocytes: 0 %
LYMPHS PCT: 13 %
Lymphs Abs: 0.7 10*3/uL (ref 0.7–4.0)
MCH: 30.8 pg (ref 26.0–34.0)
MCHC: 32.6 g/dL (ref 30.0–36.0)
MCV: 94.4 fL (ref 80.0–100.0)
MONO ABS: 0.5 10*3/uL (ref 0.1–1.0)
Monocytes Relative: 11 %
Neutro Abs: 3.7 10*3/uL (ref 1.7–7.7)
Neutrophils Relative %: 74 %
PLATELETS: 150 10*3/uL (ref 150–400)
RBC: 3.96 MIL/uL (ref 3.87–5.11)
RDW: 14.2 % (ref 11.5–15.5)
WBC: 5 10*3/uL (ref 4.0–10.5)
nRBC: 0 % (ref 0.0–0.2)

## 2018-09-18 LAB — TSH: TSH: 2.962 u[IU]/mL (ref 0.308–3.960)

## 2018-09-18 MED ORDER — SODIUM CHLORIDE 0.9 % IV SOLN
500.0000 mg | Freq: Once | INTRAVENOUS | Status: AC
  Administered 2018-09-18: 500 mg via INTRAVENOUS
  Filled 2018-09-18: qty 10

## 2018-09-18 MED ORDER — SODIUM CHLORIDE 0.9 % IV SOLN
Freq: Once | INTRAVENOUS | Status: AC
  Administered 2018-09-18: 12:00:00 via INTRAVENOUS
  Filled 2018-09-18: qty 250

## 2018-09-18 MED ORDER — SODIUM CHLORIDE 0.9% FLUSH
10.0000 mL | INTRAVENOUS | Status: DC | PRN
  Administered 2018-09-18: 10 mL
  Filled 2018-09-18: qty 10

## 2018-09-18 MED ORDER — HEPARIN SOD (PORK) LOCK FLUSH 100 UNIT/ML IV SOLN
500.0000 [IU] | Freq: Once | INTRAVENOUS | Status: AC | PRN
  Administered 2018-09-18: 500 [IU]
  Filled 2018-09-18: qty 5

## 2018-09-18 NOTE — Progress Notes (Signed)
[facility] OFFICE PROGRESS NOTE   Diagnosis: Non-small cell lung cancer  INTERVAL HISTORY:   Melissa Golden completed another treatment with durvalumab on 09/04/2018.  She reports tolerating the treatment well.  No diarrhea or rash.  She feels well.  No new complaint.  Objective:  Vital signs in last 24 hours:  Blood pressure 126/66, pulse 90, temperature 98.2 F (36.8 C), temperature source Oral, resp. rate 18, height '5\' 2"'$  (1.575 m), weight 119 lb 1.6 oz (54 kg), SpO2 97 %.    HEENT: No thrush or ulcers Resp: Distant breath sounds, lungs clear bilaterally, no respiratory distress Cardio: Regular rate and rhythm GI: No hepatomegaly, nontender Vascular: No leg edema   Portacath/PICC-without erythema  Lab Results:  Lab Results  Component Value Date   WBC 5.0 09/18/2018   HGB 12.2 09/18/2018   HCT 37.4 09/18/2018   MCV 94.4 09/18/2018   PLT 150 09/18/2018   NEUTROABS 3.7 09/18/2018    CMP  Lab Results  Component Value Date   NA 141 09/18/2018   K 4.1 09/18/2018   CL 105 09/18/2018   CO2 27 09/18/2018   GLUCOSE 87 09/18/2018   BUN 6 (L) 09/18/2018   CREATININE 0.60 09/18/2018   CALCIUM 9.1 09/18/2018   PROT 6.9 09/18/2018   ALBUMIN 3.5 09/18/2018   AST 15 09/18/2018   ALT 9 09/18/2018   ALKPHOS 77 09/18/2018   BILITOT 0.3 09/18/2018   GFRNONAA >60 09/18/2018   GFRAA >60 09/18/2018    Medications: I have reviewed the patient's current medications.   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  Status postlaparoscopic appendectomy 09/12/2017. Postoperative course complicated by an ileus requiring TPN.   Final pathology of the appendix-5 cm adenocarcinoma/goblet cell carcinoid. The tumor extended through the muscularis propria into subserosal soft tissue and mesoappendix and  focally to the serosal surface. Lymphovascular space invasion identified. Perineural invasion identified. Associated acute appendicitis with perforation.  10/15/2017 CEA 1.  11/22/2017 chromogranin A-3  Status postlaparoscopic right hemicolectomy by Dr. Janace Litten 12/07/2017. No carcinomatosis or liver metastases were seen.   Final pathologyshowed metastatic adenocarcinoma/goblet cell carcinoid in 3 of 15 pericolic lymph nodes; maximum size of largest metastasis 0.2 cm; positive for extracapsular extension. Benign terminal ileum with fibrous serosal adhesions, negative for dysplasia or carcinoma; surgically absent appendix; appendectomy site with postsurgical changes and fibrous serosal adhesions, negative for carcinoma; proximal and distal mucosal margins negative for dysplasia or carcinoma; mesenteric resection margin negative for carcinoma.  Cycle 1 adjuvant Xeloda 01/07/2018  Cycle 2 adjuvant Xeloda 01/28/2018 2. Non-small cell lung cancer  PET [id number] hypermetabolic right upper lobe pulmonary nodules increased in size and metabolic activity. No mediastinal nodal metastasis. 4 mm right upper lobe nodule with metabolic activity, SUV 3.6. A third right upper lobe nodule measures 4 mm and has faint metabolic activity. No clear evidence of malignancy in the abdomen or pelvis. Mild activity along the ventral abdominal surgical scar. Scattered activity in small bowel without focal lesion on CT.  CT biopsy right upper lobe nodule 11/02/2017 showed non-small cell carcinoma; malignant cells positive for TTF-1 and negative for p63. PD-L1 95%.  Chest CT 01/21/2018-new mediastinal adenopathy seen in the prevascular and right paratracheal stations. Largest lymph node is seen in the prevascular space measuring 2.0 cm, previously 6 mm. No hilar or axillary adenopathy. Right lung nodules are stable.  Biopsy of a right  paratracheal lymph node on 02/04/2018-metastatic  adenocarcinoma consistent with a lung primary  Chest radiation started 03/11/2018  Week 1 Taxol/carboplatin 03/13/2018  Week 2 Taxol/carboplatin 03/20/2018  Week 3 Taxol/carboplatin 03/27/2018  Week4Taxol/carboplatin 04/03/2018  Week 5 Taxol/carboplatin 04/10/2018  Week 6 Taxol/carboplatin 04/19/2018  Chest radiation completed 04/25/2018  CT chest 05/23/2018-interval decrease in size of mediastinal adenopathy. Near complete resolution of previously visualized right pleural effusion. The right lung nodules have decreased in size. Other nodules are stable.  Cycle 1 durvalumab 05/23/2018  Cycle 2durvalumab 06/06/2018  Cycle 3 durvalumab 06/20/2018  Cycle 4 durvalumab 07/04/2018  Cycle 5 durvalumab 07/19/2018  Cycle 6 durvalumab 08/01/2018  Cycle 7 durvalumab 08/15/2018  CT chest 09/03/2018- collapse/consolidation with bronchiectasis and surrounding groundglass involving primarily the right upper lobe, findings most indicative of recent radiation therapy.  Previously measured right upper lobe nodule obscured.  New areas of nodularity in the right lower lobe, likely infectious or inflammatory.  Cycle 8 durvalumab 09/04/2018  Cycle 9 durvalumab 09/18/2018   3. COPD  4.VATS drainage of a loculated right pleural effusion 03/01/2018  5.Port-A-Cath placement7/03/2018    Disposition: Melissa Golden appears unchanged.  She is tolerating the durvalumab well.  She will complete another treatment today.  She will return for an office visit and treatment in 2 weeks.  15 minutes were spent with the patient today.  The majority of the time was used for counseling and coordination of care.  Betsy Coder, MD  09/18/2018  2:13 PM

## 2018-09-18 NOTE — Patient Instructions (Signed)
[facility] Discharge Instructions for Patients Receiving Chemotherapy  Today you received the following chemotherapy agents: Durvalumab (Imfinzi)   To help prevent nausea and vomiting after your treatment, we encourage you to take your nausea medication as directed.   If you develop nausea and vomiting that is not controlled by your nausea medication, call the clinic.   BELOW ARE SYMPTOMS THAT SHOULD BE REPORTED IMMEDIATELY:  *FEVER GREATER THAN 100.5 F  *CHILLS WITH OR WITHOUT FEVER  NAUSEA AND VOMITING THAT IS NOT CONTROLLED WITH YOUR NAUSEA MEDICATION  *UNUSUAL SHORTNESS OF BREATH  *UNUSUAL BRUISING OR BLEEDING  TENDERNESS IN MOUTH AND THROAT WITH OR WITHOUT PRESENCE OF ULCERS  *URINARY PROBLEMS  *BOWEL PROBLEMS  UNUSUAL RASH Items with * indicate a potential emergency and should be followed up as soon as possible.  Feel free to call the clinic should you have any questions or concerns. The clinic phone number is [phone].  Please show the CHEMO ALERT CARD at check-in to the Emergency Department and triage nurse.   

## 2018-09-29 ENCOUNTER — Other Ambulatory Visit: Payer: Self-pay | Admitting: Oncology

## 2018-10-01 ENCOUNTER — Other Ambulatory Visit: Payer: Self-pay | Admitting: Emergency Medicine

## 2018-10-01 DIAGNOSIS — C3411 Malignant neoplasm of upper lobe, right bronchus or lung: Secondary | ICD-10-CM

## 2018-10-02 ENCOUNTER — Inpatient Hospital Stay: Payer: Medicare Other | Attending: Nurse Practitioner

## 2018-10-02 ENCOUNTER — Inpatient Hospital Stay: Payer: Medicare Other

## 2018-10-02 ENCOUNTER — Inpatient Hospital Stay (HOSPITAL_BASED_OUTPATIENT_CLINIC_OR_DEPARTMENT_OTHER): Payer: Medicare Other | Admitting: Oncology

## 2018-10-02 VITALS — BP 133/71 | HR 86 | Temp 98.1°F | Resp 17 | Ht 62.0 in | Wt 119.4 lb

## 2018-10-02 DIAGNOSIS — J449 Chronic obstructive pulmonary disease, unspecified: Secondary | ICD-10-CM

## 2018-10-02 DIAGNOSIS — R5381 Other malaise: Secondary | ICD-10-CM | POA: Diagnosis not present

## 2018-10-02 DIAGNOSIS — Z923 Personal history of irradiation: Secondary | ICD-10-CM

## 2018-10-02 DIAGNOSIS — C3411 Malignant neoplasm of upper lobe, right bronchus or lung: Secondary | ICD-10-CM

## 2018-10-02 DIAGNOSIS — Z5112 Encounter for antineoplastic immunotherapy: Secondary | ICD-10-CM | POA: Diagnosis not present

## 2018-10-02 DIAGNOSIS — Z79899 Other long term (current) drug therapy: Secondary | ICD-10-CM | POA: Insufficient documentation

## 2018-10-02 DIAGNOSIS — M545 Low back pain: Secondary | ICD-10-CM | POA: Insufficient documentation

## 2018-10-02 DIAGNOSIS — Z85038 Personal history of other malignant neoplasm of large intestine: Secondary | ICD-10-CM | POA: Diagnosis not present

## 2018-10-02 LAB — CMP (CANCER CENTER ONLY)
ALK PHOS: 81 U/L (ref 38–126)
ALT: 9 U/L (ref 0–44)
AST: 14 U/L — AB (ref 15–41)
Albumin: 3.4 g/dL — ABNORMAL LOW (ref 3.5–5.0)
Anion gap: 9 (ref 5–15)
BUN: 9 mg/dL (ref 8–23)
CALCIUM: 9.1 mg/dL (ref 8.9–10.3)
CO2: 28 mmol/L (ref 22–32)
Chloride: 104 mmol/L (ref 98–111)
Creatinine: 0.6 mg/dL (ref 0.44–1.00)
GFR, Est AFR Am: >60 mL/min (ref >60)
GFR, Estimated: >60 mL/min (ref >60)
GLUCOSE: 94 mg/dL (ref 70–99)
Potassium: 4.1 mmol/L (ref 3.5–5.1)
Sodium: 141 mmol/L (ref 135–145)
TOTAL PROTEIN: 6.9 g/dL (ref 6.5–8.1)

## 2018-10-02 LAB — CBC WITH DIFFERENTIAL (CANCER CENTER ONLY)
Abs Immature Granulocytes: 0.01 10*3/uL (ref 0.00–0.07)
BASOS ABS: 0 10*3/uL (ref 0.0–0.1)
Basophils Relative: 0 %
EOS ABS: 0.1 10*3/uL (ref 0.0–0.5)
EOS PCT: 1 %
HEMATOCRIT: 37.6 % (ref 36.0–46.0)
Hemoglobin: 12.3 g/dL (ref 12.0–15.0)
Immature Granulocytes: 0 %
LYMPHS ABS: 0.5 10*3/uL — AB (ref 0.7–4.0)
Lymphocytes Relative: 9 %
MCH: 31 pg (ref 26.0–34.0)
MCHC: 32.7 g/dL (ref 30.0–36.0)
MCV: 94.7 fL (ref 80.0–100.0)
MONO ABS: 0.5 10*3/uL (ref 0.1–1.0)
Monocytes Relative: 11 %
NRBC: 0 % (ref 0.0–0.2)
Neutro Abs: 4 10*3/uL (ref 1.7–7.7)
Neutrophils Relative %: 79 %
Platelet Count: 154 10*3/uL (ref 150–400)
RBC: 3.97 MIL/uL (ref 3.87–5.11)
RDW: 13.8 % (ref 11.5–15.5)
WBC Count: 5.1 10*3/uL (ref 4.0–10.5)

## 2018-10-02 MED ORDER — HEPARIN SOD (PORK) LOCK FLUSH 100 UNIT/ML IV SOLN
500.0000 [IU] | Freq: Once | INTRAVENOUS | Status: AC | PRN
  Administered 2018-10-02: 500 [IU]
  Filled 2018-10-02: qty 5

## 2018-10-02 MED ORDER — SODIUM CHLORIDE 0.9 % IV SOLN
Freq: Once | INTRAVENOUS | Status: AC
  Administered 2018-10-02: 11:00:00 via INTRAVENOUS
  Filled 2018-10-02: qty 250

## 2018-10-02 MED ORDER — SODIUM CHLORIDE 0.9 % IV SOLN
500.0000 mg | Freq: Once | INTRAVENOUS | Status: AC
  Administered 2018-10-02: 500 mg via INTRAVENOUS
  Filled 2018-10-02: qty 10

## 2018-10-02 MED ORDER — SODIUM CHLORIDE 0.9% FLUSH
10.0000 mL | INTRAVENOUS | Status: DC | PRN
  Administered 2018-10-02: 10 mL
  Filled 2018-10-02: qty 10

## 2018-10-02 NOTE — Patient Instructions (Signed)
[facility] Discharge Instructions for Patients Receiving Chemotherapy  Today you received the following chemotherapy agents: Imfinzi.  To help prevent nausea and vomiting after your treatment, we encourage you to take your nausea medication as directed.   If you develop nausea and vomiting that is not controlled by your nausea medication, call the clinic.   BELOW ARE SYMPTOMS THAT SHOULD BE REPORTED IMMEDIATELY:  *FEVER GREATER THAN 100.5 F  *CHILLS WITH OR WITHOUT FEVER  NAUSEA AND VOMITING THAT IS NOT CONTROLLED WITH YOUR NAUSEA MEDICATION  *UNUSUAL SHORTNESS OF BREATH  *UNUSUAL BRUISING OR BLEEDING  TENDERNESS IN MOUTH AND THROAT WITH OR WITHOUT PRESENCE OF ULCERS  *URINARY PROBLEMS  *BOWEL PROBLEMS  UNUSUAL RASH Items with * indicate a potential emergency and should be followed up as soon as possible.  Feel free to call the clinic should you have any questions or concerns. The clinic phone number is [phone].  Please show the CHEMO ALERT CARD at check-in to the Emergency Department and triage nurse.   

## 2018-10-02 NOTE — Progress Notes (Signed)
[place] OFFICE PROGRESS NOTE   Diagnosis: Non-small cell lung cancer  INTERVAL HISTORY:   Melissa Golden completed another treatment with durvalumab on 09/18/2018.  She feels well.  No rash or diarrhea.  No dyspnea.  She has pain at the upper back and shoulders intermittently.  Objective:  Vital signs in last 24 hours:  Blood pressure 133/71, pulse 86, temperature 98.1 F (36.7 C), temperature source Oral, resp. rate 17, height _0  (1.575 m), weight 119 lb 6.4 oz (54.2 kg), SpO2 99 %.    HEENT: Neck without mass Lymphatics: No cervical or supraclavicular nodes Resp: Lungs clear bilaterally Cardio: Regular rate and rhythm GI: No hepatomegaly, nontender Vascular: No leg edema  Skin: No rash  Portacath/PICC-without erythema  Lab Results:  Lab Results  Component Value Date   WBC 5.1 10/02/2018   HGB 12.3 10/02/2018   HCT 37.6 10/02/2018   MCV 94.7 10/02/2018   PLT 154 10/02/2018   NEUTROABS 4.0 10/02/2018    CMP  Lab Results  Component Value Date   NA 141 10/02/2018   K 4.1 10/02/2018   CL 104 10/02/2018   CO2 28 10/02/2018   GLUCOSE 94 10/02/2018   BUN 9 10/02/2018   CREATININE 0.60 10/02/2018   CALCIUM 9.1 10/02/2018   PROT 6.9 10/02/2018   ALBUMIN 3.4 (L) 10/02/2018   AST 14 (L) 10/02/2018   ALT 9 10/02/2018   ALKPHOS 81 10/02/2018   BILITOT <0.2 (L) 10/02/2018   GFRNONAA >60 10/02/2018   GFRAA >60 10/02/2018     Medications: I have reviewed the patient's current medications.   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  Status postlaparoscopic appendectomy 09/12/2017. Postoperative course complicated by an ileus requiring TPN.   Final pathology of the appendix-5 cm adenocarcinoma/goblet cell carcinoid. The tumor extended through the muscularis propria into  subserosal soft tissue and mesoappendix and focally to the serosal surface. Lymphovascular space invasion identified. Perineural invasion identified. Associated acute appendicitis with perforation.  10/15/2017 CEA 1.  11/22/2017 chromogranin A-3  Status postlaparoscopic right hemicolectomy by Dr. Janace Litten 12/07/2017. No carcinomatosis or liver metastases were seen.   Final pathologyshowed metastatic adenocarcinoma/goblet cell carcinoid in 3 of 15 pericolic lymph nodes; maximum size of largest metastasis 0.2 cm; positive for extracapsular extension. Benign terminal ileum with fibrous serosal adhesions, negative for dysplasia or carcinoma; surgically absent appendix; appendectomy site with postsurgical changes and fibrous serosal adhesions, negative for carcinoma; proximal and distal mucosal margins negative for dysplasia or carcinoma; mesenteric resection margin negative for carcinoma.  Cycle 1 adjuvant Xeloda 01/07/2018  Cycle 2 adjuvant Xeloda 01/28/2018 2. Non-small cell lung cancer  PET [id number] hypermetabolic right upper lobe pulmonary nodules increased in size and metabolic activity. No mediastinal nodal metastasis. 4 mm right upper lobe nodule with metabolic activity, SUV 3.6. A third right upper lobe nodule measures 4 mm and has faint metabolic activity. No clear evidence of malignancy in the abdomen or pelvis. Mild activity along the ventral abdominal surgical scar. Scattered activity in small bowel without focal lesion on CT.  CT biopsy right upper lobe nodule 11/02/2017 showed non-small cell carcinoma; malignant cells positive for TTF-1 and negative for p63. PD-L1 95%.  Chest CT 01/21/2018-new mediastinal adenopathy seen in the prevascular and right paratracheal stations. Largest lymph node is seen in the prevascular space measuring 2.0 cm, previously 6 mm. No hilar or axillary adenopathy. Right lung  nodules are stable.  Biopsy of a right paratracheal  lymph node on 02/04/2018-metastatic adenocarcinoma consistent with a lung primary  Chest radiation started 03/11/2018  Week 1 Taxol/carboplatin 03/13/2018  Week 2 Taxol/carboplatin 03/20/2018  Week 3 Taxol/carboplatin 03/27/2018  Week4Taxol/carboplatin 04/03/2018  Week 5 Taxol/carboplatin 04/10/2018  Week 6 Taxol/carboplatin 04/19/2018  Chest radiation completed 04/25/2018  CT chest 05/23/2018-interval decrease in size of mediastinal adenopathy. Near complete resolution of previously visualized right pleural effusion. The right lung nodules have decreased in size. Other nodules are stable.  Cycle 1 durvalumab 05/23/2018  Cycle 2durvalumab 06/06/2018  Cycle 3 durvalumab 06/20/2018  Cycle 4 durvalumab 07/04/2018  Cycle 5 durvalumab 07/19/2018  Cycle 6 durvalumab 08/01/2018  Cycle 7 durvalumab 08/15/2018  CT chest 09/03/2018- collapse/consolidation with bronchiectasis and surrounding groundglass involving primarilytheright upper lobe, findings most indicative of recent radiation therapy. Previously measured right upper lobe nodule obscured. New areas of nodularity in the right lower lobe, likely infectious or inflammatory.  Cycle 8durvalumab10/07/2018  Cycle 9 durvalumab 09/18/2018  Cycle 10 durvalumab 10/02/2018   3. COPD  4.VATS drainage of a loculated right pleural effusion 03/01/2018  5.Port-A-Cath placement7/03/2018     Disposition: Melissa Golden appears stable.  She is tolerating the durvalumab well.  She will complete another treatment today.  She will return for an office visit and durvalumab in 2 weeks.  15 minutes were spent with the patient today.  The majority of the time was used for counseling and coordination of care.  Betsy Coder, MD  10/02/2018  10:26 AM

## 2018-10-03 ENCOUNTER — Telehealth: Payer: Self-pay | Admitting: Oncology

## 2018-10-03 NOTE — Telephone Encounter (Signed)
Scheduled appt pe 11/6 los - pt to get an updated schedule next visit.

## 2018-10-10 DIAGNOSIS — Z6821 Body mass index (BMI) 21.0-21.9, adult: Secondary | ICD-10-CM | POA: Diagnosis not present

## 2018-10-10 DIAGNOSIS — M549 Dorsalgia, unspecified: Secondary | ICD-10-CM | POA: Diagnosis not present

## 2018-10-10 DIAGNOSIS — C3411 Malignant neoplasm of upper lobe, right bronchus or lung: Secondary | ICD-10-CM | POA: Diagnosis not present

## 2018-10-10 DIAGNOSIS — F419 Anxiety disorder, unspecified: Secondary | ICD-10-CM | POA: Diagnosis not present

## 2018-10-10 DIAGNOSIS — J440 Chronic obstructive pulmonary disease with acute lower respiratory infection: Secondary | ICD-10-CM | POA: Diagnosis not present

## 2018-10-13 ENCOUNTER — Other Ambulatory Visit: Payer: Self-pay | Admitting: Oncology

## 2018-10-14 ENCOUNTER — Other Ambulatory Visit: Payer: Self-pay | Admitting: *Deleted

## 2018-10-14 DIAGNOSIS — C3411 Malignant neoplasm of upper lobe, right bronchus or lung: Secondary | ICD-10-CM

## 2018-10-16 ENCOUNTER — Other Ambulatory Visit: Payer: Medicare Other

## 2018-10-16 ENCOUNTER — Inpatient Hospital Stay: Payer: Medicare Other

## 2018-10-16 ENCOUNTER — Encounter: Payer: Self-pay | Admitting: Nurse Practitioner

## 2018-10-16 ENCOUNTER — Inpatient Hospital Stay (HOSPITAL_BASED_OUTPATIENT_CLINIC_OR_DEPARTMENT_OTHER): Payer: Medicare Other | Admitting: Nurse Practitioner

## 2018-10-16 VITALS — BP 133/60 | HR 77 | Temp 98.5°F | Resp 17 | Ht 62.0 in | Wt 121.0 lb

## 2018-10-16 DIAGNOSIS — J449 Chronic obstructive pulmonary disease, unspecified: Secondary | ICD-10-CM

## 2018-10-16 DIAGNOSIS — Z85038 Personal history of other malignant neoplasm of large intestine: Secondary | ICD-10-CM | POA: Diagnosis not present

## 2018-10-16 DIAGNOSIS — C3411 Malignant neoplasm of upper lobe, right bronchus or lung: Secondary | ICD-10-CM

## 2018-10-16 DIAGNOSIS — Z923 Personal history of irradiation: Secondary | ICD-10-CM

## 2018-10-16 DIAGNOSIS — Z79899 Other long term (current) drug therapy: Secondary | ICD-10-CM | POA: Diagnosis not present

## 2018-10-16 DIAGNOSIS — R5381 Other malaise: Secondary | ICD-10-CM

## 2018-10-16 DIAGNOSIS — Z5112 Encounter for antineoplastic immunotherapy: Secondary | ICD-10-CM | POA: Diagnosis not present

## 2018-10-16 DIAGNOSIS — M545 Low back pain: Secondary | ICD-10-CM

## 2018-10-16 LAB — CMP (CANCER CENTER ONLY)
ALK PHOS: 80 U/L (ref 38–126)
ALT: 8 U/L (ref 0–44)
ANION GAP: 9 (ref 5–15)
AST: 14 U/L — ABNORMAL LOW (ref 15–41)
Albumin: 3.8 g/dL (ref 3.5–5.0)
BILIRUBIN TOTAL: 0.3 mg/dL (ref 0.3–1.2)
BUN: 10 mg/dL (ref 8–23)
CALCIUM: 9.5 mg/dL (ref 8.9–10.3)
CO2: 29 mmol/L (ref 22–32)
Chloride: 100 mmol/L (ref 98–111)
Creatinine: 0.69 mg/dL (ref 0.44–1.00)
GFR, Estimated: >60 mL/min (ref >60)
Glucose, Bld: 81 mg/dL (ref 70–99)
Potassium: 4.3 mmol/L (ref 3.5–5.1)
Sodium: 138 mmol/L (ref 135–145)
TOTAL PROTEIN: 7.3 g/dL (ref 6.5–8.1)

## 2018-10-16 LAB — CBC WITH DIFFERENTIAL (CANCER CENTER ONLY)
Abs Immature Granulocytes: 0.02 10*3/uL (ref 0.00–0.07)
BASOS ABS: 0 10*3/uL (ref 0.0–0.1)
BASOS PCT: 1 %
EOS ABS: 0 10*3/uL (ref 0.0–0.5)
Eosinophils Relative: 1 %
HCT: 39.8 % (ref 36.0–46.0)
Hemoglobin: 12.8 g/dL (ref 12.0–15.0)
IMMATURE GRANULOCYTES: 0 %
Lymphocytes Relative: 13 %
Lymphs Abs: 0.8 10*3/uL (ref 0.7–4.0)
MCH: 31.2 pg (ref 26.0–34.0)
MCHC: 32.2 g/dL (ref 30.0–36.0)
MCV: 97.1 fL (ref 80.0–100.0)
Monocytes Absolute: 0.6 10*3/uL (ref 0.1–1.0)
Monocytes Relative: 9 %
NEUTROS PCT: 76 %
NRBC: 0 % (ref 0.0–0.2)
Neutro Abs: 4.9 10*3/uL (ref 1.7–7.7)
PLATELETS: 168 10*3/uL (ref 150–400)
RBC: 4.1 MIL/uL (ref 3.87–5.11)
RDW: 13.2 % (ref 11.5–15.5)
WBC: 6.4 10*3/uL (ref 4.0–10.5)

## 2018-10-16 MED ORDER — SODIUM CHLORIDE 0.9% FLUSH
10.0000 mL | INTRAVENOUS | Status: DC | PRN
  Administered 2018-10-16: 10 mL
  Filled 2018-10-16: qty 10

## 2018-10-16 MED ORDER — SODIUM CHLORIDE 0.9 % IV SOLN
Freq: Once | INTRAVENOUS | Status: AC
  Administered 2018-10-16: 15:00:00 via INTRAVENOUS
  Filled 2018-10-16: qty 250

## 2018-10-16 MED ORDER — HEPARIN SOD (PORK) LOCK FLUSH 100 UNIT/ML IV SOLN
500.0000 [IU] | Freq: Once | INTRAVENOUS | Status: AC | PRN
  Administered 2018-10-16: 500 [IU]
  Filled 2018-10-16: qty 5

## 2018-10-16 MED ORDER — SODIUM CHLORIDE 0.9 % IV SOLN
9.5000 mg/kg | Freq: Once | INTRAVENOUS | Status: AC
  Administered 2018-10-16: 500 mg via INTRAVENOUS
  Filled 2018-10-16: qty 10

## 2018-10-16 NOTE — Patient Instructions (Signed)
Cancer Center Discharge Instructions for Patients Receiving Chemotherapy  Today you received the following chemotherapy agents: Imfinzi.  To help prevent nausea and vomiting after your treatment, we encourage you to take your nausea medication as directed.   If you develop nausea and vomiting that is not controlled by your nausea medication, call the clinic.   BELOW ARE SYMPTOMS THAT SHOULD BE REPORTED IMMEDIATELY:  *FEVER GREATER THAN 100.5 F  *CHILLS WITH OR WITHOUT FEVER  NAUSEA AND VOMITING THAT IS NOT CONTROLLED WITH YOUR NAUSEA MEDICATION  *UNUSUAL SHORTNESS OF BREATH  *UNUSUAL BRUISING OR BLEEDING  TENDERNESS IN MOUTH AND THROAT WITH OR WITHOUT PRESENCE OF ULCERS  *URINARY PROBLEMS  *BOWEL PROBLEMS  UNUSUAL RASH Items with * indicate a potential emergency and should be followed up as soon as possible.  Feel free to call the clinic should you have any questions or concerns. The clinic phone number is [phone].  Please show the CHEMO ALERT CARD at check-in to the Emergency Department and triage nurse.   

## 2018-10-16 NOTE — Progress Notes (Signed)
[place] OFFICE PROGRESS NOTE   Diagnosis: Non-small cell lung cancer  INTERVAL HISTORY:   [name] returns as scheduled.  She completed another cycle of durvalumab 10/02/2018.  She denies nausea/vomiting.  No mouth sores.  No diarrhea.  No rash.  She has stable dyspnea on exertion.  For the past week or so she has had coughing, sneezing and a runny nose.  No fever.  She reports chronic back pain with a recent diagnosis of "arthritis".  No leg weakness or numbness.  No bowel or bladder dysfunction.  She would like a refill on tramadol.  Objective:  Vital signs in last 24 hours:  Blood pressure 133/60, pulse 77, temperature 98.5 F (36.9 C), temperature source Oral, resp. rate 17, height '5\' 2"'$  (1.575 m), weight 121 lb (54.9 kg), SpO2 96 %.    HEENT: No thrush or ulcers. Resp: Lungs clear bilaterally. Cardio: Regular rate and rhythm. GI: Abdomen soft and nontender.  No hepatomegaly. Vascular: No leg edema. Neuro: Lower extremity motor strength 5/5.  Knee DTRs 2+, symmetric.   Lab Results:  Lab Results  Component Value Date   WBC 6.4 10/16/2018   HGB 12.8 10/16/2018   HCT 39.8 10/16/2018   MCV 97.1 10/16/2018   PLT 168 10/16/2018   NEUTROABS 4.9 10/16/2018    Imaging:  No results found.  Medications: I have reviewed the patient's current medications.  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  Status postlaparoscopic appendectomy 09/12/2017. Postoperative course complicated by an ileus requiring TPN.   Final pathology of the appendix-5 cm adenocarcinoma/goblet cell carcinoid. The tumor extended through the muscularis propria into subserosal soft tissue and mesoappendix and focally to the serosal surface. Lymphovascular space invasion identified. Perineural invasion identified.  Associated acute appendicitis with perforation.  10/15/2017 CEA 1.  11/22/2017 chromogranin A-3  Status postlaparoscopic right hemicolectomy by Dr. Janace Litten 12/07/2017. No carcinomatosis or liver metastases were seen.   Final pathologyshowed metastatic adenocarcinoma/goblet cell carcinoid in 3 of 15 pericolic lymph nodes; maximum size of largest metastasis 0.2 cm; positive for extracapsular extension. Benign terminal ileum with fibrous serosal adhesions, negative for dysplasia or carcinoma; surgically absent appendix; appendectomy site with postsurgical changes and fibrous serosal adhesions, negative for carcinoma; proximal and distal mucosal margins negative for dysplasia or carcinoma; mesenteric resection margin negative for carcinoma.  Cycle 1 adjuvant Xeloda 01/07/2018  Cycle 2 adjuvant Xeloda 01/28/2018 2. Non-small cell lung cancer  PET [id number] hypermetabolic right upper lobe pulmonary nodules increased in size and metabolic activity. No mediastinal nodal metastasis. 4 mm right upper lobe nodule with metabolic activity, SUV 3.6. A third right upper lobe nodule measures 4 mm and has faint metabolic activity. No clear evidence of malignancy in the abdomen or pelvis. Mild activity along the ventral abdominal surgical scar. Scattered activity in small bowel without focal lesion on CT.  CT biopsy right upper lobe nodule 11/02/2017 showed non-small cell carcinoma; malignant cells positive for TTF-1 and negative for p63. PD-L1 95%.  Chest CT 01/21/2018-new mediastinal adenopathy seen in the prevascular and right paratracheal stations. Largest lymph node is seen in the prevascular space measuring 2.0 cm, previously 6 mm. No hilar or axillary adenopathy. Right lung nodules are stable.  Biopsy of a right paratracheal lymph node on 02/04/2018-metastatic adenocarcinoma consistent with a lung primary  Chest radiation started 03/11/2018  Week 1 Taxol/carboplatin  03/13/2018  Week 2 Taxol/carboplatin 03/20/2018  Week 3 Taxol/carboplatin 03/27/2018  Week4Taxol/carboplatin 04/03/2018  Week 5 Taxol/carboplatin 04/10/2018  Week 6 Taxol/carboplatin 04/19/2018  Chest radiation completed 04/25/2018  CT chest 05/23/2018-interval decrease in size of mediastinal adenopathy. Near complete resolution of previously visualized right pleural effusion. The right lung nodules have decreased in size. Other nodules are stable.  Cycle 1 durvalumab 05/23/2018  Cycle 2durvalumab 06/06/2018  Cycle 3 durvalumab 06/20/2018  Cycle 4 durvalumab 07/04/2018  Cycle 5 durvalumab 07/19/2018  Cycle 6 durvalumab 08/01/2018  Cycle 7 durvalumab 08/15/2018  CT chest 09/03/2018-collapse/consolidation with bronchiectasis and surrounding groundglass involving primarilytheright upper lobe, findings most indicative of recent radiation therapy. Previously measured right upper lobe nodule obscured. New areas of nodularity in the right lower lobe, likely infectious or inflammatory.  Cycle 8durvalumab10/07/2018  Cycle 9 durvalumab 09/18/2018  Cycle 10 durvalumab 10/02/2018   3. COPD  4.VATS drainage of a loculated right pleural effusion 03/01/2018  5.Port-A-Cath placement7/03/2018    Disposition: Melissa Golden appears stable.  She has completed 10 cycles of durvalumab.  Plan to proceed with cycle 11 today as scheduled.  She has chronic back pain.  She reports recent x-rays showing "arthritis".  She will contact the ordering provider to discuss pain management.  She will let us know if the back pain worsens or she develops any new symptoms such as leg weakness or numbness, bowel or bladder dysfunction.  She will return for lab, follow-up and the next cycle of durvalumab in 2 weeks.  Plan reviewed with Dr. Benay Spice.      Ned Card ANP/GNP-BC   10/16/2018  2:14 PM

## 2018-10-27 ENCOUNTER — Other Ambulatory Visit: Payer: Self-pay | Admitting: Oncology

## 2018-10-29 ENCOUNTER — Other Ambulatory Visit: Payer: Self-pay | Admitting: *Deleted

## 2018-10-29 DIAGNOSIS — C3411 Malignant neoplasm of upper lobe, right bronchus or lung: Secondary | ICD-10-CM

## 2018-10-30 ENCOUNTER — Inpatient Hospital Stay: Payer: Medicare Other

## 2018-10-30 ENCOUNTER — Ambulatory Visit (HOSPITAL_COMMUNITY)
Admission: RE | Admit: 2018-10-30 | Discharge: 2018-10-30 | Disposition: A | Discharge status: Home / Self Care | Payer: Medicare Other | Source: Ambulatory Visit | Attending: Oncology | Admitting: Oncology

## 2018-10-30 ENCOUNTER — Encounter (HOSPITAL_COMMUNITY): Payer: Self-pay

## 2018-10-30 ENCOUNTER — Inpatient Hospital Stay: Payer: Medicare Other | Attending: Nurse Practitioner | Admitting: Oncology

## 2018-10-30 VITALS — BP 149/68 | HR 71 | Temp 97.0°F | Resp 19 | Ht 62.0 in | Wt 120.7 lb

## 2018-10-30 DIAGNOSIS — J449 Chronic obstructive pulmonary disease, unspecified: Secondary | ICD-10-CM | POA: Insufficient documentation

## 2018-10-30 DIAGNOSIS — C3411 Malignant neoplasm of upper lobe, right bronchus or lung: Secondary | ICD-10-CM

## 2018-10-30 DIAGNOSIS — Z95828 Presence of other vascular implants and grafts: Secondary | ICD-10-CM

## 2018-10-30 DIAGNOSIS — Z79899 Other long term (current) drug therapy: Secondary | ICD-10-CM | POA: Insufficient documentation

## 2018-10-30 DIAGNOSIS — Z85038 Personal history of other malignant neoplasm of large intestine: Secondary | ICD-10-CM | POA: Diagnosis not present

## 2018-10-30 DIAGNOSIS — Z923 Personal history of irradiation: Secondary | ICD-10-CM

## 2018-10-30 DIAGNOSIS — R5381 Other malaise: Secondary | ICD-10-CM | POA: Diagnosis not present

## 2018-10-30 DIAGNOSIS — Z5112 Encounter for antineoplastic immunotherapy: Secondary | ICD-10-CM | POA: Diagnosis not present

## 2018-10-30 DIAGNOSIS — S22050A Wedge compression fracture of T5-T6 vertebra, initial encounter for closed fracture: Secondary | ICD-10-CM | POA: Diagnosis not present

## 2018-10-30 DIAGNOSIS — M545 Low back pain: Secondary | ICD-10-CM | POA: Diagnosis not present

## 2018-10-30 LAB — CBC WITH DIFFERENTIAL (CANCER CENTER ONLY)
Abs Immature Granulocytes: 0.01 10*3/uL (ref 0.00–0.07)
Basophils Absolute: 0 10*3/uL (ref 0.0–0.1)
Basophils Relative: 1 %
Eosinophils Absolute: 0 10*3/uL (ref 0.0–0.5)
Eosinophils Relative: 1 %
HCT: 39.4 % (ref 36.0–46.0)
Hemoglobin: 12.8 g/dL (ref 12.0–15.0)
Immature Granulocytes: 0 %
Lymphocytes Relative: 12 %
Lymphs Abs: 0.7 10*3/uL (ref 0.7–4.0)
MCH: 30.9 pg (ref 26.0–34.0)
MCHC: 32.5 g/dL (ref 30.0–36.0)
MCV: 95.2 fL (ref 80.0–100.0)
Monocytes Absolute: 0.6 10*3/uL (ref 0.1–1.0)
Monocytes Relative: 11 %
NEUTROS PCT: 75 %
Neutro Abs: 4.2 10*3/uL (ref 1.7–7.7)
Platelet Count: 160 10*3/uL (ref 150–400)
RBC: 4.14 MIL/uL (ref 3.87–5.11)
RDW: 13.1 % (ref 11.5–15.5)
WBC Count: 5.5 10*3/uL (ref 4.0–10.5)
nRBC: 0 % (ref 0.0–0.2)

## 2018-10-30 LAB — CMP (CANCER CENTER ONLY)
ALT: 8 U/L (ref 0–44)
ANION GAP: 9 (ref 5–15)
AST: 13 U/L — ABNORMAL LOW (ref 15–41)
Albumin: 3.6 g/dL (ref 3.5–5.0)
Alkaline Phosphatase: 81 U/L (ref 38–126)
BUN: 10 mg/dL (ref 8–23)
CO2: 27 mmol/L (ref 22–32)
Calcium: 9.3 mg/dL (ref 8.9–10.3)
Chloride: 104 mmol/L (ref 98–111)
Creatinine: 0.65 mg/dL (ref 0.44–1.00)
GFR, Estimated: >60 mL/min (ref >60)
Glucose, Bld: 92 mg/dL (ref 70–99)
Potassium: 4.1 mmol/L (ref 3.5–5.1)
Sodium: 140 mmol/L (ref 135–145)
Total Bilirubin: 0.4 mg/dL (ref 0.3–1.2)
Total Protein: 7 g/dL (ref 6.5–8.1)

## 2018-10-30 MED ORDER — OXYCODONE-ACETAMINOPHEN 5-325 MG PO TABS
1.0000 | ORAL_TABLET | ORAL | Status: AC
  Administered 2018-10-30: 1 via ORAL

## 2018-10-30 MED ORDER — OXYCODONE-ACETAMINOPHEN 5-325 MG PO TABS: 1.0000 | ORAL_TABLET | ORAL | 0 refills | Status: DC | PRN

## 2018-10-30 MED ORDER — SODIUM CHLORIDE 0.9% FLUSH
10.0000 mL | Freq: Once | INTRAVENOUS | Status: AC
  Administered 2018-10-30: 10 mL
  Filled 2018-10-30: qty 10

## 2018-10-30 MED ORDER — OXYCODONE-ACETAMINOPHEN 5-325 MG PO TABS
ORAL_TABLET | ORAL | Status: AC
  Filled 2018-10-30: qty 1

## 2018-10-30 MED ORDER — HEPARIN SOD (PORK) LOCK FLUSH 100 UNIT/ML IV SOLN
500.0000 [IU] | Freq: Once | INTRAVENOUS | Status: AC
  Administered 2018-10-30: 500 [IU] via INTRAVENOUS
  Filled 2018-10-30: qty 5

## 2018-10-30 MED ORDER — SODIUM CHLORIDE 0.9% FLUSH
10.0000 mL | INTRAVENOUS | Status: DC | PRN
  Administered 2018-10-30: 10 mL via INTRAVENOUS
  Filled 2018-10-30: qty 10

## 2018-10-30 NOTE — Progress Notes (Signed)
[place] OFFICE PROGRESS NOTE   Diagnosis: Non-small cell lung cancer  INTERVAL HISTORY:   Ms. Kreger completed another treatment with durvalumab on 10/16/2018.  No rash or diarrhea.  No change in baseline dyspnea.  She complains of severe pain in the mid back.  This has been present for approximately 5 weeks, but has progressed.  Tylenol and ibuprofen have not helped.  No neurologic symptoms.  Objective:  Vital signs in last 24 hours:  Blood pressure (!) 149/68, pulse 71, temperature (!) 97 F (36.1 C), temperature source Oral, resp. rate 19, height 5' 2" (1.575 m), weight 120 lb 11.2 oz (54.7 kg), SpO2 97 %.    HEENT: No thrush or ulcers Resp: Lungs clear bilaterally Cardio: Regular rate and rhythm GI: No hepatomegaly, nontender Vascular: No leg edema Musculoskeletal: Tender at the mid thoracic spine, no mass, no rash  Portacath/PICC-without erythema  Lab Results:  Lab Results  Component Value Date   WBC 5.5 10/30/2018   HGB 12.8 10/30/2018   HCT 39.4 10/30/2018   MCV 95.2 10/30/2018   PLT 160 10/30/2018   NEUTROABS 4.2 10/30/2018    CMP  Lab Results  Component Value Date   NA 140 10/30/2018   K 4.1 10/30/2018   CL 104 10/30/2018   CO2 27 10/30/2018   GLUCOSE 92 10/30/2018   BUN 10 10/30/2018   CREATININE 0.65 10/30/2018   CALCIUM 9.3 10/30/2018   PROT 7.0 10/30/2018   ALBUMIN 3.6 10/30/2018   AST 13 (L) 10/30/2018   ALT 8 10/30/2018   ALKPHOS 81 10/30/2018   BILITOT 0.4 10/30/2018   GFRNONAA >60 10/30/2018   GFRAA >60 10/30/2018    Imaging:  Ct Thoracic Spine Wo Contrast  Result Date: 10/30/2018 CLINICAL DATA:  72 year old female with 5 weeks of mid back pain, progressive. Lung cancer diagnosed in 2017 status post chemotherapy and radiation, immunotherapy in progress. EXAM: CT THORACIC SPINE WITHOUT CONTRAST TECHNIQUE: Multidetector CT images of the thoracic were obtained using the standard protocol without intravenous contrast.  COMPARISON:  Chest CT 09/03/2018. FINDINGS: Alignment: Overall stable thoracic kyphosis. No spondylolisthesis. Vertebrae: Chronic osteopenia. Compression fracture of T5 with 50% loss of vertebral body height is new since October. There are superior and inferior endplate deformities. Minimal retropulsion of bone. The T5 pedicles and posterior elements appear intact. Other thoracic levels appear stable and intact. Paraspinal and other soft tissues: Stable lung parenchyma since October. Sequelae of right upper lobe radiation therapy again suspected with consolidation and bronchiectasis. No pleural or pericardial effusion. Right chest porta cath remains in place. No mediastinal lymphadenopathy is evident. Stable and negative visible noncontrast upper abdominal viscera. Mild if any T5 level paraspinal edema. Disc levels: No age advanced degenerative changes. No CT evidence of thoracic spinal stenosis. IMPRESSION: 1. T5 compression fracture with 50% loss of vertebral body height is new since 09/03/2018. This appears to be a benign/osteoporotic fracture. No significant retropulsion of bone or complicating features. 2. Otherwise negative thoracic spine; osteopenia. 3. Stable visible chest. Electronically Signed   By: Genevie Ann M.D.   On: 10/30/2018 11:39    Medications: I have reviewed the patient's current medications.   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  Status postlaparoscopic appendectomy 09/12/2017. Postoperative course complicated by an ileus requiring TPN.   Final pathology of the appendix-5 cm adenocarcinoma/goblet cell carcinoid. The tumor extended through the muscularis propria into  subserosal soft tissue and mesoappendix and focally to the serosal surface. Lymphovascular space invasion identified. Perineural invasion identified.  Associated acute appendicitis with perforation.  10/15/2017 CEA 1.  11/22/2017 chromogranin A-3  Status postlaparoscopic right hemicolectomy by Dr. Janace Litten 12/07/2017. No carcinomatosis or liver metastases were seen.   Final pathologyshowed metastatic adenocarcinoma/goblet cell carcinoid in 3 of 15 pericolic lymph nodes; maximum size of largest metastasis 0.2 cm; positive for extracapsular extension. Benign terminal ileum with fibrous serosal adhesions, negative for dysplasia or carcinoma; surgically absent appendix; appendectomy site with postsurgical changes and fibrous serosal adhesions, negative for carcinoma; proximal and distal mucosal margins negative for dysplasia or carcinoma; mesenteric resection margin negative for carcinoma.  Cycle 1 adjuvant Xeloda 01/07/2018  Cycle 2 adjuvant Xeloda 01/28/2018 2. Non-small cell lung cancer  PET [id number] hypermetabolic right upper lobe pulmonary nodules increased in size and metabolic activity. No mediastinal nodal metastasis. 4 mm right upper lobe nodule with metabolic activity, SUV 3.6. A third right upper lobe nodule measures 4 mm and has faint metabolic activity. No clear evidence of malignancy in the abdomen or pelvis. Mild activity along the ventral abdominal surgical scar. Scattered activity in small bowel without focal lesion on CT.  CT biopsy right upper lobe nodule 11/02/2017 showed non-small cell carcinoma; malignant cells positive for TTF-1 and negative for p63. PD-L1 95%.  Chest CT 01/21/2018-new mediastinal adenopathy seen in the prevascular and right paratracheal stations. Largest lymph node is seen in the prevascular space measuring 2.0 cm, previously 6 mm. No hilar or axillary adenopathy. Right lung nodules are stable.  Biopsy of a right paratracheal lymph node on 02/04/2018-metastatic adenocarcinoma consistent with a lung primary  Chest radiation started 03/11/2018  Week 1 Taxol/carboplatin  03/13/2018  Week 2 Taxol/carboplatin 03/20/2018  Week 3 Taxol/carboplatin 03/27/2018  Week4Taxol/carboplatin 04/03/2018  Week 5 Taxol/carboplatin 04/10/2018  Week 6 Taxol/carboplatin 04/19/2018  Chest radiation completed 04/25/2018  CT chest 05/23/2018-interval decrease in size of mediastinal adenopathy. Near complete resolution of previously visualized right pleural effusion. The right lung nodules have decreased in size. Other nodules are stable.  Cycle 1 durvalumab 05/23/2018  Cycle 2durvalumab 06/06/2018  Cycle 3 durvalumab 06/20/2018  Cycle 4 durvalumab 07/04/2018  Cycle 5 durvalumab 07/19/2018  Cycle 6 durvalumab 08/01/2018  Cycle 7 durvalumab 08/15/2018  CT chest 09/03/2018-collapse/consolidation with bronchiectasis and surrounding groundglass involving primarilytheright upper lobe, findings most indicative of recent radiation therapy. Previously measured right upper lobe nodule obscured. New areas of nodularity in the right lower lobe, likely infectious or inflammatory.  Cycle 8durvalumab10/07/2018  Cycle 9 durvalumab 09/18/2018  Cycle 10 durvalumab 10/02/2018   3. COPD  4.VATS drainage of a loculated right pleural effusion 03/01/2018  5.Port-A-Cath placement7/03/2018  6.  Severe mid back pain 10/30/2018-CT thoracic spine- T5 compression fracture new since 09/03/2018- appearance consistent with an osteoporotic fracture    Disposition: Ms. Fei continues to tolerate the durvalumab well.  She presents today with severe mid back pain.  The pain appears to be related to a T5 compression fracture.  The compression fracture is most likely benign.  We gave her a prescription for Percocet to use as needed for severe pain.  She will contact us if the pain has not improved over the next week and we will consider making an orthopedic or neurosurgery referral.  I have a low clinical suspicion for a pathologic fracture.  She will return for an office visit and durvalumab  in 2 weeks.  25 minutes were spent with the patient today.  The majority of  the time was used for counseling and coordination of care.   Betsy Coder, MD  10/30/2018  2:34 PM

## 2018-11-08 ENCOUNTER — Telehealth: Payer: Self-pay

## 2018-11-08 NOTE — Telephone Encounter (Signed)
Per 12/13  Verify appt.

## 2018-11-10 ENCOUNTER — Other Ambulatory Visit: Payer: Self-pay | Admitting: Oncology

## 2018-11-12 ENCOUNTER — Other Ambulatory Visit: Payer: Self-pay | Admitting: *Deleted

## 2018-11-12 DIAGNOSIS — C3411 Malignant neoplasm of upper lobe, right bronchus or lung: Secondary | ICD-10-CM

## 2018-11-13 ENCOUNTER — Inpatient Hospital Stay: Payer: Medicare Other

## 2018-11-13 ENCOUNTER — Inpatient Hospital Stay (HOSPITAL_BASED_OUTPATIENT_CLINIC_OR_DEPARTMENT_OTHER): Payer: Medicare Other | Admitting: Oncology

## 2018-11-13 VITALS — BP 147/82 | HR 84 | Temp 98.0°F | Resp 18 | Ht 62.0 in | Wt 122.3 lb

## 2018-11-13 DIAGNOSIS — M545 Low back pain: Secondary | ICD-10-CM | POA: Diagnosis not present

## 2018-11-13 DIAGNOSIS — Z923 Personal history of irradiation: Secondary | ICD-10-CM

## 2018-11-13 DIAGNOSIS — C3411 Malignant neoplasm of upper lobe, right bronchus or lung: Secondary | ICD-10-CM

## 2018-11-13 DIAGNOSIS — J449 Chronic obstructive pulmonary disease, unspecified: Secondary | ICD-10-CM | POA: Diagnosis not present

## 2018-11-13 DIAGNOSIS — Z85038 Personal history of other malignant neoplasm of large intestine: Secondary | ICD-10-CM | POA: Diagnosis not present

## 2018-11-13 DIAGNOSIS — R530 Neoplastic (malignant) related fatigue: Secondary | ICD-10-CM

## 2018-11-13 DIAGNOSIS — Z79899 Other long term (current) drug therapy: Secondary | ICD-10-CM | POA: Diagnosis not present

## 2018-11-13 DIAGNOSIS — Z95828 Presence of other vascular implants and grafts: Secondary | ICD-10-CM

## 2018-11-13 DIAGNOSIS — Z5181 Encounter for therapeutic drug level monitoring: Secondary | ICD-10-CM

## 2018-11-13 DIAGNOSIS — R5381 Other malaise: Secondary | ICD-10-CM

## 2018-11-13 DIAGNOSIS — Z5112 Encounter for antineoplastic immunotherapy: Secondary | ICD-10-CM | POA: Diagnosis not present

## 2018-11-13 LAB — CBC WITH DIFFERENTIAL (CANCER CENTER ONLY)
Abs Immature Granulocytes: 0.02 10*3/uL (ref 0.00–0.07)
BASOS ABS: 0 10*3/uL (ref 0.0–0.1)
Basophils Relative: 0 %
Eosinophils Absolute: 0 10*3/uL (ref 0.0–0.5)
Eosinophils Relative: 1 %
HEMATOCRIT: 37.9 % (ref 36.0–46.0)
Hemoglobin: 12.2 g/dL (ref 12.0–15.0)
IMMATURE GRANULOCYTES: 0 %
LYMPHS ABS: 0.6 10*3/uL — AB (ref 0.7–4.0)
Lymphocytes Relative: 10 %
MCH: 31.2 pg (ref 26.0–34.0)
MCHC: 32.2 g/dL (ref 30.0–36.0)
MCV: 96.9 fL (ref 80.0–100.0)
Monocytes Absolute: 0.6 10*3/uL (ref 0.1–1.0)
Monocytes Relative: 11 %
Neutro Abs: 4.1 10*3/uL (ref 1.7–7.7)
Neutrophils Relative %: 78 %
Platelet Count: 147 10*3/uL — ABNORMAL LOW (ref 150–400)
RBC: 3.91 MIL/uL (ref 3.87–5.11)
RDW: 13.1 % (ref 11.5–15.5)
WBC Count: 5.3 10*3/uL (ref 4.0–10.5)
nRBC: 0 % (ref 0.0–0.2)

## 2018-11-13 LAB — CMP (CANCER CENTER ONLY)
ALK PHOS: 84 U/L (ref 38–126)
ALT: 12 U/L (ref 0–44)
ANION GAP: 9 (ref 5–15)
AST: 14 U/L — ABNORMAL LOW (ref 15–41)
Albumin: 3.5 g/dL (ref 3.5–5.0)
BUN: 12 mg/dL (ref 8–23)
CO2: 28 mmol/L (ref 22–32)
Calcium: 9 mg/dL (ref 8.9–10.3)
Chloride: 103 mmol/L (ref 98–111)
Creatinine: 0.61 mg/dL (ref 0.44–1.00)
GFR, Est AFR Am: >60 mL/min (ref >60)
GFR, Estimated: >60 mL/min (ref >60)
Glucose, Bld: 88 mg/dL (ref 70–99)
Potassium: 4.3 mmol/L (ref 3.5–5.1)
Sodium: 140 mmol/L (ref 135–145)
Total Bilirubin: 0.3 mg/dL (ref 0.3–1.2)
Total Protein: 6.7 g/dL (ref 6.5–8.1)

## 2018-11-13 MED ORDER — SODIUM CHLORIDE 0.9 % IV SOLN
Freq: Once | INTRAVENOUS | Status: DC
  Filled 2018-11-13: qty 250

## 2018-11-13 MED ORDER — SODIUM CHLORIDE 0.9 % IV SOLN
9.5000 mg/kg | Freq: Once | INTRAVENOUS | Status: AC
  Administered 2018-11-13: 500 mg via INTRAVENOUS
  Filled 2018-11-13: qty 10

## 2018-11-13 MED ORDER — SODIUM CHLORIDE 0.9% FLUSH
10.0000 mL | Freq: Once | INTRAVENOUS | Status: AC
  Administered 2018-11-13: 10 mL
  Filled 2018-11-13: qty 10

## 2018-11-13 MED ORDER — HEPARIN SOD (PORK) LOCK FLUSH 100 UNIT/ML IV SOLN
500.0000 [IU] | Freq: Once | INTRAVENOUS | Status: AC | PRN
  Administered 2018-11-13: 500 [IU]
  Filled 2018-11-13: qty 5

## 2018-11-13 MED ORDER — SODIUM CHLORIDE 0.9% FLUSH
10.0000 mL | INTRAVENOUS | Status: DC | PRN
  Filled 2018-11-13: qty 10

## 2018-11-13 NOTE — Progress Notes (Signed)
[place] OFFICE PROGRESS NOTE   Diagnosis: Non-small cell lung cancer  INTERVAL HISTORY:   Ms. Knick returns as scheduled.  The back pain is much improved.  She takes oxycodone twice daily.  The pain is better with ambulation.  No rash or diarrhea.  Dyspnea remains improved.  She is ready to resume durvalumab.  Objective:  Vital signs in last 24 hours:  Blood pressure (!) 147/82, pulse 84, temperature 98 F (36.7 C), temperature source Oral, resp. rate 18, height _0  (1.575 m), weight 122 lb 4.8 oz (55.5 kg), SpO2 96 %.    HEENT: No thrush Resp: Distant breath sounds, scattered inspiratory rhonchi at the left anterior and posterior chest, no respiratory distress Cardio: Regular rate and rhythm GI: No hepatomegaly, nontender Vascular: No leg edema  Skin: No rash  Portacath/PICC-without erythema  Lab Results:  Lab Results  Component Value Date   WBC 5.3 11/13/2018   HGB 12.2 11/13/2018   HCT 37.9 11/13/2018   MCV 96.9 11/13/2018   PLT 147 (L) 11/13/2018   NEUTROABS 4.1 11/13/2018    CMP  Lab Results  Component Value Date   NA 140 10/30/2018   K 4.1 10/30/2018   CL 104 10/30/2018   CO2 27 10/30/2018   GLUCOSE 92 10/30/2018   BUN 10 10/30/2018   CREATININE 0.65 10/30/2018   CALCIUM 9.3 10/30/2018   PROT 7.0 10/30/2018   ALBUMIN 3.6 10/30/2018   AST 13 (L) 10/30/2018   ALT 8 10/30/2018   ALKPHOS 81 10/30/2018   BILITOT 0.4 10/30/2018   GFRNONAA >60 10/30/2018   GFRAA >60 10/30/2018     Medications: I have reviewed the patient's current medications.   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  Status postlaparoscopic appendectomy 09/12/2017. Postoperative course complicated by an ileus requiring TPN.   Final pathology of the appendix-5 cm adenocarcinoma/goblet cell  carcinoid. The tumor extended through the muscularis propria into subserosal soft tissue and mesoappendix and focally to the serosal surface. Lymphovascular space invasion identified. Perineural invasion identified. Associated acute appendicitis with perforation.  10/15/2017 CEA 1.  11/22/2017 chromogranin A-3  Status postlaparoscopic right hemicolectomy by Dr. Janace Litten 12/07/2017. No carcinomatosis or liver metastases were seen.   Final pathologyshowed metastatic adenocarcinoma/goblet cell carcinoid in 3 of 15 pericolic lymph nodes; maximum size of largest metastasis 0.2 cm; positive for extracapsular extension. Benign terminal ileum with fibrous serosal adhesions, negative for dysplasia or carcinoma; surgically absent appendix; appendectomy site with postsurgical changes and fibrous serosal adhesions, negative for carcinoma; proximal and distal mucosal margins negative for dysplasia or carcinoma; mesenteric resection margin negative for carcinoma.  Cycle 1 adjuvant Xeloda 01/07/2018  Cycle 2 adjuvant Xeloda 01/28/2018 2. Non-small cell lung cancer  PET [id number] hypermetabolic right upper lobe pulmonary nodules increased in size and metabolic activity. No mediastinal nodal metastasis. 4 mm right upper lobe nodule with metabolic activity, SUV 3.6. A third right upper lobe nodule measures 4 mm and has faint metabolic activity. No clear evidence of malignancy in the abdomen or pelvis. Mild activity along the ventral abdominal surgical scar. Scattered activity in small bowel without focal lesion on CT.  CT biopsy right upper lobe nodule 11/02/2017 showed non-small cell carcinoma; malignant cells positive for TTF-1 and negative for p63. PD-L1 95%.  Chest CT 01/21/2018-new mediastinal adenopathy seen in the prevascular and right paratracheal stations. Largest lymph node is seen in the  prevascular space measuring 2.0 cm, previously 6 mm. No hilar or axillary adenopathy.  Right lung nodules are stable.  Biopsy of a right paratracheal lymph node on 02/04/2018-metastatic adenocarcinoma consistent with a lung primary  Chest radiation started 03/11/2018  Week 1 Taxol/carboplatin 03/13/2018  Week 2 Taxol/carboplatin 03/20/2018  Week 3 Taxol/carboplatin 03/27/2018  Week4Taxol/carboplatin 04/03/2018  Week 5 Taxol/carboplatin 04/10/2018  Week 6 Taxol/carboplatin 04/19/2018  Chest radiation completed 04/25/2018  CT chest 05/23/2018-interval decrease in size of mediastinal adenopathy. Near complete resolution of previously visualized right pleural effusion. The right lung nodules have decreased in size. Other nodules are stable.  Cycle 1 durvalumab 05/23/2018  Cycle 2durvalumab 06/06/2018  Cycle 3 durvalumab 06/20/2018  Cycle 4 durvalumab 07/04/2018  Cycle 5 durvalumab 07/19/2018  Cycle 6 durvalumab 08/01/2018  Cycle 7 durvalumab 08/15/2018  CT chest 09/03/2018-collapse/consolidation with bronchiectasis and surrounding groundglass involving primarilytheright upper lobe, findings most indicative of recent radiation therapy. Previously measured right upper lobe nodule obscured. New areas of nodularity in the right lower lobe, likely infectious or inflammatory.  Cycle 8durvalumab10/07/2018  Cycle 9 durvalumab 09/18/2018  Cycle 10 durvalumab 10/02/2018  Cycle 11 durvalumab 10/16/2018   Cycle 12 durvalumab 11/13/2018   3. COPD  4.VATS drainage of a loculated right pleural effusion 03/01/2018  5.Port-A-Cath placement7/03/2018  6.  Severe mid back pain 10/30/2018-CT thoracic spine- T5 compression fracture new since 09/03/2018- appearance consistent with an osteoporotic fracture    Disposition: Ms. Hangartner appears well.  The pain related to the T5 compression fracture has improved.  She will resume durvalumab today.  She will return for an office visit and durvalumab in 2 weeks.  15 minutes were spent with the patient today.  The majority of the  time was used for counseling and coordination of care.  Betsy Coder, MD  11/13/2018  9:27 AM

## 2018-11-13 NOTE — Patient Instructions (Signed)
[facility] Discharge Instructions for Patients Receiving Chemotherapy  Today you received the following chemotherapy agents: Imfinzi.  To help prevent nausea and vomiting after your treatment, we encourage you to take your nausea medication as directed.   If you develop nausea and vomiting that is not controlled by your nausea medication, call the clinic.   BELOW ARE SYMPTOMS THAT SHOULD BE REPORTED IMMEDIATELY:  *FEVER GREATER THAN 100.5 F  *CHILLS WITH OR WITHOUT FEVER  NAUSEA AND VOMITING THAT IS NOT CONTROLLED WITH YOUR NAUSEA MEDICATION  *UNUSUAL SHORTNESS OF BREATH  *UNUSUAL BRUISING OR BLEEDING  TENDERNESS IN MOUTH AND THROAT WITH OR WITHOUT PRESENCE OF ULCERS  *URINARY PROBLEMS  *BOWEL PROBLEMS  UNUSUAL RASH Items with * indicate a potential emergency and should be followed up as soon as possible.  Feel free to call the clinic should you have any questions or concerns. The clinic phone number is [phone].  Please show the CHEMO ALERT CARD at check-in to the Emergency Department and triage nurse.   

## 2018-11-19 ENCOUNTER — Telehealth: Payer: Self-pay | Admitting: Oncology

## 2018-11-19 NOTE — Telephone Encounter (Signed)
Scheduled appt per 12/18 los - pt is aware of appt date and time - aware of decoupled appts on 12/31 and 1/4

## 2018-11-24 ENCOUNTER — Other Ambulatory Visit: Payer: Self-pay | Admitting: Oncology

## 2018-11-25 ENCOUNTER — Other Ambulatory Visit: Payer: Self-pay | Admitting: *Deleted

## 2018-11-25 DIAGNOSIS — C3411 Malignant neoplasm of upper lobe, right bronchus or lung: Secondary | ICD-10-CM

## 2018-11-26 ENCOUNTER — Inpatient Hospital Stay (HOSPITAL_BASED_OUTPATIENT_CLINIC_OR_DEPARTMENT_OTHER): Payer: Medicare Other | Admitting: Oncology

## 2018-11-26 ENCOUNTER — Inpatient Hospital Stay: Payer: Medicare Other

## 2018-11-26 VITALS — BP 146/79 | HR 63 | Temp 98.3°F | Resp 18 | Ht 62.0 in | Wt 122.4 lb

## 2018-11-26 DIAGNOSIS — J449 Chronic obstructive pulmonary disease, unspecified: Secondary | ICD-10-CM | POA: Diagnosis not present

## 2018-11-26 DIAGNOSIS — R5381 Other malaise: Secondary | ICD-10-CM | POA: Diagnosis not present

## 2018-11-26 DIAGNOSIS — C3411 Malignant neoplasm of upper lobe, right bronchus or lung: Secondary | ICD-10-CM

## 2018-11-26 DIAGNOSIS — Z79899 Other long term (current) drug therapy: Secondary | ICD-10-CM

## 2018-11-26 DIAGNOSIS — M545 Low back pain: Secondary | ICD-10-CM

## 2018-11-26 DIAGNOSIS — Z923 Personal history of irradiation: Secondary | ICD-10-CM

## 2018-11-26 DIAGNOSIS — Z5112 Encounter for antineoplastic immunotherapy: Secondary | ICD-10-CM | POA: Diagnosis not present

## 2018-11-26 DIAGNOSIS — Z85038 Personal history of other malignant neoplasm of large intestine: Secondary | ICD-10-CM | POA: Diagnosis not present

## 2018-11-26 DIAGNOSIS — Z95828 Presence of other vascular implants and grafts: Secondary | ICD-10-CM

## 2018-11-26 LAB — CMP (CANCER CENTER ONLY)
ALT: 13 U/L (ref 0–44)
AST: 15 U/L (ref 15–41)
Albumin: 3.6 g/dL (ref 3.5–5.0)
Alkaline Phosphatase: 86 U/L (ref 38–126)
Anion gap: 7 (ref 5–15)
BUN: 8 mg/dL (ref 8–23)
CHLORIDE: 106 mmol/L (ref 98–111)
CO2: 29 mmol/L (ref 22–32)
Calcium: 9.5 mg/dL (ref 8.9–10.3)
Creatinine: 0.59 mg/dL (ref 0.44–1.00)
GFR, Est AFR Am: >60 mL/min (ref >60)
GFR, Estimated: >60 mL/min (ref >60)
Glucose, Bld: 106 mg/dL — ABNORMAL HIGH (ref 70–99)
Potassium: 4.3 mmol/L (ref 3.5–5.1)
Sodium: 142 mmol/L (ref 135–145)
Total Bilirubin: 0.4 mg/dL (ref 0.3–1.2)
Total Protein: 7 g/dL (ref 6.5–8.1)

## 2018-11-26 LAB — CBC WITH DIFFERENTIAL (CANCER CENTER ONLY)
Abs Immature Granulocytes: 0.03 10*3/uL (ref 0.00–0.07)
Basophils Absolute: 0 10*3/uL (ref 0.0–0.1)
Basophils Relative: 0 %
Eosinophils Absolute: 0 10*3/uL (ref 0.0–0.5)
Eosinophils Relative: 0 %
HCT: 37.8 % (ref 36.0–46.0)
Hemoglobin: 12.4 g/dL (ref 12.0–15.0)
Immature Granulocytes: 1 %
LYMPHS ABS: 0.6 10*3/uL — AB (ref 0.7–4.0)
Lymphocytes Relative: 9 %
MCH: 31.9 pg (ref 26.0–34.0)
MCHC: 32.8 g/dL (ref 30.0–36.0)
MCV: 97.2 fL (ref 80.0–100.0)
Monocytes Absolute: 0.5 10*3/uL (ref 0.1–1.0)
Monocytes Relative: 8 %
Neutro Abs: 5.3 10*3/uL (ref 1.7–7.7)
Neutrophils Relative %: 82 %
PLATELETS: 156 10*3/uL (ref 150–400)
RBC: 3.89 MIL/uL (ref 3.87–5.11)
RDW: 13.5 % (ref 11.5–15.5)
WBC Count: 6.4 10*3/uL (ref 4.0–10.5)
nRBC: 0 % (ref 0.0–0.2)

## 2018-11-26 MED ORDER — SODIUM CHLORIDE 0.9% FLUSH
10.0000 mL | Freq: Once | INTRAVENOUS | Status: AC
  Administered 2018-11-26: 10 mL
  Filled 2018-11-26: qty 10

## 2018-11-26 MED ORDER — OXYCODONE-ACETAMINOPHEN 5-325 MG PO TABS: 1.0000 | ORAL_TABLET | ORAL | 0 refills | Status: DC | PRN

## 2018-11-26 MED ORDER — HEPARIN SOD (PORK) LOCK FLUSH 100 UNIT/ML IV SOLN
500.0000 [IU] | Freq: Once | INTRAVENOUS | Status: AC
  Administered 2018-11-26: 500 [IU]
  Filled 2018-11-26: qty 5

## 2018-11-26 NOTE — Progress Notes (Signed)
[facility] OFFICE PROGRESS NOTE   Diagnosis: Non-small cell lung cancer  INTERVAL HISTORY:   [name] returns as scheduled.  She completed another treatment with durvalumab on 11/13/2018.  She reports tolerating the treatment well.  No rash or diarrhea.  The back pain is stable.  Objective:  Vital signs in last 24 hours:  Blood pressure (!) 146/79, pulse 63, temperature 98.3 F (36.8 C), temperature source Oral, resp. rate 18, height 5' 2" (1.575 m), weight 122 lb 6.4 oz (55.5 kg), SpO2 97 %.    HEENT: No thrush or ulcers Lymphatics: No cervical or supraclavicular nodes Resp: Lungs clear bilaterally Cardio: Regular rate and rhythm GI: No hepatomegaly Vascular: No leg edema  Skin: No rash  Portacath/PICC-without erythema  Lab Results:  Lab Results  Component Value Date   WBC 6.4 11/26/2018   HGB 12.4 11/26/2018   HCT 37.8 11/26/2018   MCV 97.2 11/26/2018   PLT 156 11/26/2018   NEUTROABS 5.3 11/26/2018    CMP  Lab Results  Component Value Date   NA 142 11/26/2018   K 4.3 11/26/2018   CL 106 11/26/2018   CO2 29 11/26/2018   GLUCOSE 106 (H) 11/26/2018   BUN 8 11/26/2018   CREATININE 0.59 11/26/2018   CALCIUM 9.5 11/26/2018   PROT 7.0 11/26/2018   ALBUMIN 3.6 11/26/2018   AST 15 11/26/2018   ALT 13 11/26/2018   ALKPHOS 86 11/26/2018   BILITOT 0.4 11/26/2018   GFRNONAA >60 11/26/2018   GFRAA >60 11/26/2018     Medications: I have reviewed the patient's current medications.   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  Status postlaparoscopic appendectomy 09/12/2017. Postoperative course complicated by an ileus requiring TPN.   Final pathology of the appendix-5 cm adenocarcinoma/goblet cell carcinoid. The tumor extended through the muscularis propria into subserosal soft  tissue and mesoappendix and focally to the serosal surface. Lymphovascular space invasion identified. Perineural invasion identified. Associated acute appendicitis with perforation.  10/15/2017 CEA 1.  11/22/2017 chromogranin A-3  Status postlaparoscopic right hemicolectomy by Dr. Stitzenbergat [facility] 12/07/2017. No carcinomatosis or liver metastases were seen.   Final pathologyshowed metastatic adenocarcinoma/goblet cell carcinoid in 3 of 15 pericolic lymph nodes; maximum size of largest metastasis 0.2 cm; positive for extracapsular extension. Benign terminal ileum with fibrous serosal adhesions, negative for dysplasia or carcinoma; surgically absent appendix; appendectomy site with postsurgical changes and fibrous serosal adhesions, negative for carcinoma; proximal and distal mucosal margins negative for dysplasia or carcinoma; mesenteric resection margin negative for carcinoma.  Cycle 1 adjuvant Xeloda 01/07/2018  Cycle 2 adjuvant Xeloda 01/28/2018 2. Non-small cell lung cancer  PET scan11/21/2018-3 hypermetabolic right upper lobe pulmonary nodules increased in size and metabolic activity. No mediastinal nodal metastasis. 4 mm right upper lobe nodule with metabolic activity, SUV 3.6. A third right upper lobe nodule measures 4 mm and has faint metabolic activity. No clear evidence of malignancy in the abdomen or pelvis. Mild activity along the ventral abdominal surgical scar. Scattered activity in small bowel without focal lesion on CT.  CT biopsy right upper lobe nodule 11/02/2017 showed non-small cell carcinoma; malignant cells positive for TTF-1 and negative for p63. PD-L1 95%.  Chest CT 01/21/2018-new mediastinal adenopathy seen in the prevascular and right paratracheal stations. Largest lymph node is seen in the prevascular space measuring 2.0 cm, previously 6 mm. No hilar or axillary adenopathy. Right lung nodules   are stable.  Biopsy of a right paratracheal lymph node on  02/04/2018-metastatic adenocarcinoma consistent with a lung primary  Chest radiation started 03/11/2018  Week 1 Taxol/carboplatin 03/13/2018  Week 2 Taxol/carboplatin 03/20/2018  Week 3 Taxol/carboplatin 03/27/2018  Week4Taxol/carboplatin 04/03/2018  Week 5 Taxol/carboplatin 04/10/2018  Week 6 Taxol/carboplatin 04/19/2018  Chest radiation completed 04/25/2018  CT chest 05/23/2018-interval decrease in size of mediastinal adenopathy. Near complete resolution of previously visualized right pleural effusion. The right lung nodules have decreased in size. Other nodules are stable.  Cycle 1 durvalumab 05/23/2018  Cycle 2durvalumab 06/06/2018  Cycle 3 durvalumab 06/20/2018  Cycle 4 durvalumab 07/04/2018  Cycle 5 durvalumab 07/19/2018  Cycle 6 durvalumab 08/01/2018  Cycle 7 durvalumab 08/15/2018  CT chest 09/03/2018-collapse/consolidation with bronchiectasis and surrounding groundglass involving primarilytheright upper lobe, findings most indicative of recent radiation therapy. Previously measured right upper lobe nodule obscured. New areas of nodularity in the right lower lobe, likely infectious or inflammatory.  Cycle 8durvalumab10/07/2018  Cycle 9 durvalumab 09/18/2018  Cycle 10 durvalumab 10/02/2018  Cycle 11 durvalumab 10/16/2018   Cycle 12 durvalumab 11/13/2018  Cycle 13 durvalumab 11/30/2018   3. COPD  4.VATS drainage of a loculated right pleural effusion 03/01/2018  5.Port-A-Cath placement7/03/2018  6.  Severe mid back pain 10/30/2018-CT thoracic spine- T5 compression fracture new since 09/03/2018- appearance consistent with an osteoporotic fracture     Disposition: Melissa Golden appears stable.  She will return for the next treatment with durvalumab on 11/30/2018.  She is scheduled for an office visit and treatment on 12/11/2018.  She is tolerating the treatment well and she remains in clinical remission from non-small cell lung cancer.    Melissa Coder,  MD  11/26/2018  12:31 PM

## 2018-11-30 ENCOUNTER — Inpatient Hospital Stay: Payer: Medicare Other | Attending: Nurse Practitioner

## 2018-11-30 VITALS — BP 130/74 | HR 106 | Temp 98.7°F | Resp 22

## 2018-11-30 DIAGNOSIS — Z5112 Encounter for antineoplastic immunotherapy: Secondary | ICD-10-CM | POA: Diagnosis not present

## 2018-11-30 DIAGNOSIS — C7A020 Malignant carcinoid tumor of the appendix: Secondary | ICD-10-CM | POA: Insufficient documentation

## 2018-11-30 DIAGNOSIS — Z9221 Personal history of antineoplastic chemotherapy: Secondary | ICD-10-CM | POA: Diagnosis not present

## 2018-11-30 DIAGNOSIS — Z79899 Other long term (current) drug therapy: Secondary | ICD-10-CM | POA: Insufficient documentation

## 2018-11-30 DIAGNOSIS — C3411 Malignant neoplasm of upper lobe, right bronchus or lung: Secondary | ICD-10-CM | POA: Diagnosis not present

## 2018-11-30 DIAGNOSIS — Z923 Personal history of irradiation: Secondary | ICD-10-CM | POA: Insufficient documentation

## 2018-11-30 DIAGNOSIS — C7B01 Secondary carcinoid tumors of distant lymph nodes: Secondary | ICD-10-CM | POA: Diagnosis not present

## 2018-11-30 DIAGNOSIS — M8008XS Age-related osteoporosis with current pathological fracture, vertebra(e), sequela: Secondary | ICD-10-CM | POA: Insufficient documentation

## 2018-11-30 MED ORDER — SODIUM CHLORIDE 0.9 % IV SOLN
9.5000 mg/kg | Freq: Once | INTRAVENOUS | Status: AC
  Administered 2018-11-30: 500 mg via INTRAVENOUS
  Filled 2018-11-30: qty 10

## 2018-11-30 MED ORDER — SODIUM CHLORIDE 0.9 % IV SOLN
Freq: Once | INTRAVENOUS | Status: DC
  Filled 2018-11-30: qty 250

## 2018-11-30 MED ORDER — HEPARIN SOD (PORK) LOCK FLUSH 100 UNIT/ML IV SOLN
500.0000 [IU] | Freq: Once | INTRAVENOUS | Status: AC | PRN
  Administered 2018-11-30: 500 [IU]
  Filled 2018-11-30: qty 5

## 2018-11-30 MED ORDER — SODIUM CHLORIDE 0.9% FLUSH
10.0000 mL | INTRAVENOUS | Status: DC | PRN
  Administered 2018-11-30: 10 mL
  Filled 2018-11-30: qty 10

## 2018-11-30 NOTE — Patient Instructions (Signed)
[facility] Discharge Instructions for Patients Receiving Chemotherapy  Today you received the following chemotherapy agents: Imfinzi.  To help prevent nausea and vomiting after your treatment, we encourage you to take your nausea medication as directed.   If you develop nausea and vomiting that is not controlled by your nausea medication, call the clinic.   BELOW ARE SYMPTOMS THAT SHOULD BE REPORTED IMMEDIATELY:  *FEVER GREATER THAN 100.5 F  *CHILLS WITH OR WITHOUT FEVER  NAUSEA AND VOMITING THAT IS NOT CONTROLLED WITH YOUR NAUSEA MEDICATION  *UNUSUAL SHORTNESS OF BREATH  *UNUSUAL BRUISING OR BLEEDING  TENDERNESS IN MOUTH AND THROAT WITH OR WITHOUT PRESENCE OF ULCERS  *URINARY PROBLEMS  *BOWEL PROBLEMS  UNUSUAL RASH Items with * indicate a potential emergency and should be followed up as soon as possible.  Feel free to call the clinic should you have any questions or concerns. The clinic phone number is [phone].  Please show the CHEMO ALERT CARD at check-in to the Emergency Department and triage nurse.   

## 2018-12-06 ENCOUNTER — Other Ambulatory Visit: Payer: Self-pay | Admitting: Oncology

## 2018-12-11 ENCOUNTER — Inpatient Hospital Stay: Payer: Medicare Other

## 2018-12-11 ENCOUNTER — Encounter: Payer: Self-pay | Admitting: Nurse Practitioner

## 2018-12-11 ENCOUNTER — Inpatient Hospital Stay (HOSPITAL_BASED_OUTPATIENT_CLINIC_OR_DEPARTMENT_OTHER): Payer: Medicare Other | Admitting: Nurse Practitioner

## 2018-12-11 ENCOUNTER — Telehealth: Payer: Self-pay

## 2018-12-11 VITALS — BP 139/77 | HR 82 | Temp 98.2°F | Resp 18 | Ht 62.0 in | Wt 123.9 lb

## 2018-12-11 DIAGNOSIS — C9000 Multiple myeloma not having achieved remission: Secondary | ICD-10-CM

## 2018-12-11 DIAGNOSIS — C3411 Malignant neoplasm of upper lobe, right bronchus or lung: Secondary | ICD-10-CM

## 2018-12-11 DIAGNOSIS — Z9221 Personal history of antineoplastic chemotherapy: Secondary | ICD-10-CM

## 2018-12-11 DIAGNOSIS — Z5112 Encounter for antineoplastic immunotherapy: Secondary | ICD-10-CM | POA: Diagnosis not present

## 2018-12-11 DIAGNOSIS — Z923 Personal history of irradiation: Secondary | ICD-10-CM | POA: Diagnosis not present

## 2018-12-11 DIAGNOSIS — Z5181 Encounter for therapeutic drug level monitoring: Secondary | ICD-10-CM

## 2018-12-11 DIAGNOSIS — M8008XS Age-related osteoporosis with current pathological fracture, vertebra(e), sequela: Secondary | ICD-10-CM | POA: Diagnosis not present

## 2018-12-11 DIAGNOSIS — Z95828 Presence of other vascular implants and grafts: Secondary | ICD-10-CM

## 2018-12-11 DIAGNOSIS — R530 Neoplastic (malignant) related fatigue: Secondary | ICD-10-CM

## 2018-12-11 DIAGNOSIS — C7A020 Malignant carcinoid tumor of the appendix: Secondary | ICD-10-CM | POA: Diagnosis not present

## 2018-12-11 LAB — CBC WITH DIFFERENTIAL (CANCER CENTER ONLY)
Abs Immature Granulocytes: 0.02 10*3/uL (ref 0.00–0.07)
BASOS PCT: 0 %
Basophils Absolute: 0 10*3/uL (ref 0.0–0.1)
EOS ABS: 0 10*3/uL (ref 0.0–0.5)
EOS PCT: 0 %
HCT: 35.4 % — ABNORMAL LOW (ref 36.0–46.0)
Hemoglobin: 11.5 g/dL — ABNORMAL LOW (ref 12.0–15.0)
Immature Granulocytes: 0 %
Lymphocytes Relative: 8 %
Lymphs Abs: 0.5 10*3/uL — ABNORMAL LOW (ref 0.7–4.0)
MCH: 32.2 pg (ref 26.0–34.0)
MCHC: 32.5 g/dL (ref 30.0–36.0)
MCV: 99.2 fL (ref 80.0–100.0)
Monocytes Absolute: 0.6 10*3/uL (ref 0.1–1.0)
Monocytes Relative: 9 %
Neutro Abs: 5.1 10*3/uL (ref 1.7–7.7)
Neutrophils Relative %: 83 %
PLATELETS: 171 10*3/uL (ref 150–400)
RBC: 3.57 MIL/uL — ABNORMAL LOW (ref 3.87–5.11)
RDW: 13.9 % (ref 11.5–15.5)
WBC Count: 6.3 10*3/uL (ref 4.0–10.5)
nRBC: 0 % (ref 0.0–0.2)

## 2018-12-11 LAB — CMP (CANCER CENTER ONLY)
ALK PHOS: 86 U/L (ref 38–126)
ALT: 11 U/L (ref 0–44)
AST: 11 U/L — ABNORMAL LOW (ref 15–41)
Albumin: 3.5 g/dL (ref 3.5–5.0)
Anion gap: 7 (ref 5–15)
BILIRUBIN TOTAL: 0.3 mg/dL (ref 0.3–1.2)
BUN: 13 mg/dL (ref 8–23)
CALCIUM: 8.7 mg/dL — AB (ref 8.9–10.3)
CO2: 30 mmol/L (ref 22–32)
Chloride: 105 mmol/L (ref 98–111)
Creatinine: 0.61 mg/dL (ref 0.44–1.00)
GFR, Est AFR Am: >60 mL/min (ref >60)
Glucose, Bld: 94 mg/dL (ref 70–99)
Potassium: 4.1 mmol/L (ref 3.5–5.1)
Sodium: 142 mmol/L (ref 135–145)
TOTAL PROTEIN: 6.5 g/dL (ref 6.5–8.1)

## 2018-12-11 LAB — TSH: TSH: 1.411 u[IU]/mL (ref 0.308–3.960)

## 2018-12-11 MED ORDER — SODIUM CHLORIDE 0.9 % IV SOLN
Freq: Once | INTRAVENOUS | Status: AC
  Administered 2018-12-11: 15:00:00 via INTRAVENOUS
  Filled 2018-12-11: qty 250

## 2018-12-11 MED ORDER — SODIUM CHLORIDE 0.9 % IV SOLN
9.5000 mg/kg | Freq: Once | INTRAVENOUS | Status: AC
  Administered 2018-12-11: 500 mg via INTRAVENOUS
  Filled 2018-12-11: qty 10

## 2018-12-11 MED ORDER — SODIUM CHLORIDE 0.9% FLUSH
10.0000 mL | INTRAVENOUS | Status: DC | PRN
  Administered 2018-12-11: 10 mL
  Filled 2018-12-11: qty 10

## 2018-12-11 MED ORDER — SODIUM CHLORIDE 0.9% FLUSH
10.0000 mL | Freq: Once | INTRAVENOUS | Status: AC
  Administered 2018-12-11: 10 mL
  Filled 2018-12-11: qty 10

## 2018-12-11 MED ORDER — HEPARIN SOD (PORK) LOCK FLUSH 100 UNIT/ML IV SOLN
500.0000 [IU] | Freq: Once | INTRAVENOUS | Status: AC | PRN
  Administered 2018-12-11: 500 [IU]
  Filled 2018-12-11: qty 5

## 2018-12-11 NOTE — Telephone Encounter (Signed)
Printed avs and calender of upcoming appointment. 1/15 los

## 2018-12-11 NOTE — Progress Notes (Signed)
[place] OFFICE PROGRESS NOTE   Diagnosis: Non-small cell lung cancer  INTERVAL HISTORY:   Melissa Golden returns as scheduled.  She completed another treatment with durvalumab 11/30/2018.  He denies nausea/vomiting.  No mouth sores.  No diarrhea.  No rash.  She continues to have dyspnea.  Back pain is better.  About 1-1/2 weeks ago after stretching to get something in the car she noted onset of bilateral "side" pain.  No leg weakness or numbness.  No bowel or bladder dysfunction.  Objective:  Vital signs in last 24 hours:  Blood pressure 139/77, pulse 82, temperature 98.2 F (36.8 C), temperature source Oral, resp. rate 18, height 5' 2" (1.575 m), weight 123 lb 14.4 oz (56.2 kg), SpO2 99 %.    HEENT: No thrush or ulcers. Resp: Distant breath sounds.  No respiratory distress. Cardio: Regular rate and rhythm. GI: Abdomen soft and nontender.  No hepatomegaly. Vascular: No leg edema. Skin: No rash. Musculoskeletal: Nontender over the chest wall. Port-A-Cath without erythema.   Lab Results:  Lab Results  Component Value Date   WBC 6.4 11/26/2018   HGB 12.4 11/26/2018   HCT 37.8 11/26/2018   MCV 97.2 11/26/2018   PLT 156 11/26/2018   NEUTROABS 5.3 11/26/2018    Imaging:  No results found.  Medications: I have reviewed the patient's current medications.  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  Status postlaparoscopic appendectomy 09/12/2017. Postoperative course complicated by an ileus requiring TPN.   Final pathology of the appendix-5 cm adenocarcinoma/goblet cell carcinoid. The tumor extended through the muscularis propria into subserosal soft tissue and mesoappendix and focally to the serosal surface. Lymphovascular space invasion identified. Perineural invasion identified. Associated  acute appendicitis with perforation.  10/15/2017 CEA 1.  11/22/2017 chromogranin A-3  Status postlaparoscopic right hemicolectomy by Dr. Janace Golden 12/07/2017. No carcinomatosis or liver metastases were seen.   Final pathologyshowed metastatic adenocarcinoma/goblet cell carcinoid in 3 of 15 pericolic lymph nodes; maximum size of largest metastasis 0.2 cm; positive for extracapsular extension. Benign terminal ileum with fibrous serosal adhesions, negative for dysplasia or carcinoma; surgically absent appendix; appendectomy site with postsurgical changes and fibrous serosal adhesions, negative for carcinoma; proximal and distal mucosal margins negative for dysplasia or carcinoma; mesenteric resection margin negative for carcinoma.  Cycle 1 adjuvant Xeloda 01/07/2018  Cycle 2 adjuvant Xeloda 01/28/2018 2. Non-small cell lung cancer  PET [id number] hypermetabolic right upper lobe pulmonary nodules increased in size and metabolic activity. No mediastinal nodal metastasis. 4 mm right upper lobe nodule with metabolic activity, SUV 3.6. A third right upper lobe nodule measures 4 mm and has faint metabolic activity. No clear evidence of malignancy in the abdomen or pelvis. Mild activity along the ventral abdominal surgical scar. Scattered activity in small bowel without focal lesion on CT.  CT biopsy right upper lobe nodule 11/02/2017 showed non-small cell carcinoma; malignant cells positive for TTF-1 and negative for p63. PD-L1 95%.  Chest CT 01/21/2018-new mediastinal adenopathy seen in the prevascular and right paratracheal stations. Largest lymph node is seen in the prevascular space measuring 2.0 cm, previously 6 mm. No hilar or axillary adenopathy. Right lung nodules are stable.  Biopsy of a right paratracheal lymph node on 02/04/2018-metastatic adenocarcinoma consistent with a lung primary  Chest radiation started 03/11/2018  Week 1 Taxol/carboplatin 03/13/2018  Week  2 Taxol/carboplatin 03/20/2018  Week 3 Taxol/carboplatin 03/27/2018  Week4Taxol/carboplatin 04/03/2018  Week 5 Taxol/carboplatin 04/10/2018  Week 6 Taxol/carboplatin 04/19/2018  Chest radiation completed 04/25/2018  CT chest 05/23/2018-interval decrease in size of mediastinal adenopathy. Near complete resolution of previously visualized right pleural effusion. The right lung nodules have decreased in size. Other nodules are stable.  Cycle 1 durvalumab 05/23/2018  Cycle 2durvalumab 06/06/2018  Cycle 3 durvalumab 06/20/2018  Cycle 4 durvalumab 07/04/2018  Cycle 5 durvalumab 07/19/2018  Cycle 6 durvalumab 08/01/2018  Cycle 7 durvalumab 08/15/2018  CT chest 09/03/2018-collapse/consolidation with bronchiectasis and surrounding groundglass involving primarilytheright upper lobe, findings most indicative of recent radiation therapy. Previously measured right upper lobe nodule obscured. New areas of nodularity in the right lower lobe, likely infectious or inflammatory.  Cycle 8durvalumab10/07/2018  Cycle 9 durvalumab 09/18/2018  Cycle 10 durvalumab 10/02/2018  Cycle 11 durvalumab 10/16/2018   Cycle 12 durvalumab 11/13/2018  Cycle 13 durvalumab 11/30/2018  Cycle 14 durvalumab 12/11/2018   3. COPD  4.VATS drainage of a loculated right pleural effusion 03/01/2018  5.Port-A-Cath placement7/03/2018  6.  Severe mid back pain 10/30/2018-CT thoracic spine- T5 compression fracture new since 09/03/2018- appearance consistent with an osteoporotic fracture.  Pain improved.     Disposition: Melissa Golden appears unchanged.  She has completed 13 cycles of durvalumab.  Plan to proceed with cycle 14 today as scheduled.  The plan is for restaging CT at a 44-monthinterval from the previous.  The back pain related to the compression fracture is better.  She recently began having pain at both "sides".  This occurred after stretching to reach something.  She has Percocet at home.  She will  try 1/2 tablet every 6 hours as needed.  She understands to contact the office if the pain worsens or she develops any worrisome symptoms.  She will return for lab, follow-up and the next cycle of durvalumab in 2 weeks.  She will contact the office in the interim as outlined above or with any other problems.  Plan reviewed with Dr. SBenay Spice    LNed CardANP/GNP-BC   12/11/2018  2:08 PM

## 2018-12-11 NOTE — Patient Instructions (Signed)
[facility] Discharge Instructions for Patients Receiving Chemotherapy  Today you received the following chemotherapy agents: Durvalumab (Imfinzi)   To help prevent nausea and vomiting after your treatment, we encourage you to take your nausea medication as directed.   If you develop nausea and vomiting that is not controlled by your nausea medication, call the clinic.   BELOW ARE SYMPTOMS THAT SHOULD BE REPORTED IMMEDIATELY:  *FEVER GREATER THAN 100.5 F  *CHILLS WITH OR WITHOUT FEVER  NAUSEA AND VOMITING THAT IS NOT CONTROLLED WITH YOUR NAUSEA MEDICATION  *UNUSUAL SHORTNESS OF BREATH  *UNUSUAL BRUISING OR BLEEDING  TENDERNESS IN MOUTH AND THROAT WITH OR WITHOUT PRESENCE OF ULCERS  *URINARY PROBLEMS  *BOWEL PROBLEMS  UNUSUAL RASH Items with * indicate a potential emergency and should be followed up as soon as possible.  Feel free to call the clinic should you have any questions or concerns. The clinic phone number is [phone].  Please show the CHEMO ALERT CARD at check-in to the Emergency Department and triage nurse.   

## 2018-12-16 DIAGNOSIS — C3490 Malignant neoplasm of unspecified part of unspecified bronchus or lung: Secondary | ICD-10-CM | POA: Diagnosis not present

## 2018-12-16 DIAGNOSIS — C7951 Secondary malignant neoplasm of bone: Secondary | ICD-10-CM | POA: Diagnosis not present

## 2018-12-16 DIAGNOSIS — J189 Pneumonia, unspecified organism: Secondary | ICD-10-CM | POA: Diagnosis not present

## 2018-12-16 DIAGNOSIS — J432 Centrilobular emphysema: Secondary | ICD-10-CM | POA: Diagnosis not present

## 2018-12-16 DIAGNOSIS — Z7951 Long term (current) use of inhaled steroids: Secondary | ICD-10-CM | POA: Diagnosis not present

## 2018-12-16 DIAGNOSIS — J439 Emphysema, unspecified: Secondary | ICD-10-CM | POA: Diagnosis not present

## 2018-12-16 DIAGNOSIS — Z87891 Personal history of nicotine dependence: Secondary | ICD-10-CM | POA: Diagnosis not present

## 2018-12-19 ENCOUNTER — Telehealth: Payer: Self-pay | Admitting: *Deleted

## 2018-12-19 NOTE — Telephone Encounter (Signed)
Per Dr. Benay Spice after review of CT report and CXR--nothing alarming. OK to wait till appointment next week. She will bring in the CD with her appointment.

## 2018-12-19 NOTE — Telephone Encounter (Signed)
Patient reports she went to ED at [facility] on 1/20 and had CT scan. Was told her cancer may be in her back. Wanted Dr. Benay Spice aware. Called radiology department and they will fax copy of her CXR and CT scan and prepare a CD for patient to pick up tomorrow.  Informed patient, will have Dr. Benay Spice look at the report if we need to take action before her appointment on 12/25/18 she will be notified.

## 2018-12-22 ENCOUNTER — Other Ambulatory Visit: Payer: Self-pay | Admitting: Oncology

## 2018-12-25 ENCOUNTER — Inpatient Hospital Stay: Payer: Medicare Other

## 2018-12-25 ENCOUNTER — Inpatient Hospital Stay (HOSPITAL_BASED_OUTPATIENT_CLINIC_OR_DEPARTMENT_OTHER): Payer: Medicare Other | Admitting: Nurse Practitioner

## 2018-12-25 VITALS — BP 114/68 | HR 85 | Temp 98.6°F | Resp 18 | Ht 62.0 in | Wt 122.4 lb

## 2018-12-25 DIAGNOSIS — Z95828 Presence of other vascular implants and grafts: Secondary | ICD-10-CM

## 2018-12-25 DIAGNOSIS — C3411 Malignant neoplasm of upper lobe, right bronchus or lung: Secondary | ICD-10-CM

## 2018-12-25 DIAGNOSIS — Z923 Personal history of irradiation: Secondary | ICD-10-CM | POA: Diagnosis not present

## 2018-12-25 DIAGNOSIS — M8008XS Age-related osteoporosis with current pathological fracture, vertebra(e), sequela: Secondary | ICD-10-CM

## 2018-12-25 DIAGNOSIS — Z9221 Personal history of antineoplastic chemotherapy: Secondary | ICD-10-CM | POA: Diagnosis not present

## 2018-12-25 DIAGNOSIS — Z5112 Encounter for antineoplastic immunotherapy: Secondary | ICD-10-CM | POA: Diagnosis not present

## 2018-12-25 DIAGNOSIS — C7A020 Malignant carcinoid tumor of the appendix: Secondary | ICD-10-CM | POA: Diagnosis not present

## 2018-12-25 LAB — CBC WITH DIFFERENTIAL (CANCER CENTER ONLY)
Abs Immature Granulocytes: 0.04 10*3/uL (ref 0.00–0.07)
Basophils Absolute: 0 10*3/uL (ref 0.0–0.1)
Basophils Relative: 0 %
Eosinophils Absolute: 0 10*3/uL (ref 0.0–0.5)
Eosinophils Relative: 0 %
HCT: 35.5 % — ABNORMAL LOW (ref 36.0–46.0)
Hemoglobin: 11.9 g/dL — ABNORMAL LOW (ref 12.0–15.0)
IMMATURE GRANULOCYTES: 1 %
Lymphocytes Relative: 6 %
Lymphs Abs: 0.4 10*3/uL — ABNORMAL LOW (ref 0.7–4.0)
MCH: 32.9 pg (ref 26.0–34.0)
MCHC: 33.5 g/dL (ref 30.0–36.0)
MCV: 98.1 fL (ref 80.0–100.0)
MONOS PCT: 7 %
Monocytes Absolute: 0.5 10*3/uL (ref 0.1–1.0)
Neutro Abs: 6 10*3/uL (ref 1.7–7.7)
Neutrophils Relative %: 86 %
Platelet Count: 167 10*3/uL (ref 150–400)
RBC: 3.62 MIL/uL — ABNORMAL LOW (ref 3.87–5.11)
RDW: 13.3 % (ref 11.5–15.5)
WBC Count: 7 10*3/uL (ref 4.0–10.5)
nRBC: 0 % (ref 0.0–0.2)

## 2018-12-25 LAB — CMP (CANCER CENTER ONLY)
ALT: 8 U/L (ref 0–44)
AST: 13 U/L — ABNORMAL LOW (ref 15–41)
Albumin: 3.7 g/dL (ref 3.5–5.0)
Alkaline Phosphatase: 77 U/L (ref 38–126)
Anion gap: 11 (ref 5–15)
BUN: 11 mg/dL (ref 8–23)
CO2: 26 mmol/L (ref 22–32)
Calcium: 9 mg/dL (ref 8.9–10.3)
Chloride: 102 mmol/L (ref 98–111)
Creatinine: 0.62 mg/dL (ref 0.44–1.00)
GFR, Est AFR Am: >60 mL/min (ref >60)
Glucose, Bld: 124 mg/dL — ABNORMAL HIGH (ref 70–99)
Potassium: 3.9 mmol/L (ref 3.5–5.1)
Sodium: 139 mmol/L (ref 135–145)
Total Bilirubin: 0.4 mg/dL (ref 0.3–1.2)
Total Protein: 6.9 g/dL (ref 6.5–8.1)

## 2018-12-25 MED ORDER — SODIUM CHLORIDE 0.9% FLUSH
10.0000 mL | Freq: Once | INTRAVENOUS | Status: AC
  Administered 2018-12-25: 10 mL
  Filled 2018-12-25: qty 10

## 2018-12-25 MED ORDER — SODIUM CHLORIDE 0.9% FLUSH
10.0000 mL | INTRAVENOUS | Status: DC | PRN
  Administered 2018-12-25: 10 mL
  Filled 2018-12-25: qty 10

## 2018-12-25 MED ORDER — SODIUM CHLORIDE 0.9 % IV SOLN
Freq: Once | INTRAVENOUS | Status: AC
  Administered 2018-12-25: 14:00:00 via INTRAVENOUS
  Filled 2018-12-25: qty 250

## 2018-12-25 MED ORDER — HEPARIN SOD (PORK) LOCK FLUSH 100 UNIT/ML IV SOLN
500.0000 [IU] | Freq: Once | INTRAVENOUS | Status: AC | PRN
  Administered 2018-12-25: 500 [IU]
  Filled 2018-12-25: qty 5

## 2018-12-25 MED ORDER — SODIUM CHLORIDE 0.9 % IV SOLN
500.0000 mg | Freq: Once | INTRAVENOUS | Status: AC
  Administered 2018-12-25: 500 mg via INTRAVENOUS
  Filled 2018-12-25: qty 10

## 2018-12-25 NOTE — Patient Instructions (Signed)

## 2018-12-25 NOTE — Progress Notes (Signed)
Noted >10% variance in calculated dose and dose in bag (calculated 555mg /dose in bag 500mg ). Notified Ned Card, NP. Lattie Haw advised ok to proceed with 500mg  dose.

## 2018-12-25 NOTE — Patient Instructions (Signed)
[facility] Discharge Instructions for Patients Receiving Chemotherapy  Today you received the following chemotherapy agents: Durvalumab (Imfinzi)   To help prevent nausea and vomiting after your treatment, we encourage you to take your nausea medication as directed.   If you develop nausea and vomiting that is not controlled by your nausea medication, call the clinic.   BELOW ARE SYMPTOMS THAT SHOULD BE REPORTED IMMEDIATELY:  *FEVER GREATER THAN 100.5 F  *CHILLS WITH OR WITHOUT FEVER  NAUSEA AND VOMITING THAT IS NOT CONTROLLED WITH YOUR NAUSEA MEDICATION  *UNUSUAL SHORTNESS OF BREATH  *UNUSUAL BRUISING OR BLEEDING  TENDERNESS IN MOUTH AND THROAT WITH OR WITHOUT PRESENCE OF ULCERS  *URINARY PROBLEMS  *BOWEL PROBLEMS  UNUSUAL RASH Items with * indicate a potential emergency and should be followed up as soon as possible.  Feel free to call the clinic should you have any questions or concerns. The clinic phone number is [phone].  Please show the CHEMO ALERT CARD at check-in to the Emergency Department and triage nurse.   

## 2018-12-25 NOTE — Progress Notes (Addendum)
[place] OFFICE PROGRESS NOTE   Diagnosis:  Non-small cell lung cancer  INTERVAL HISTORY:   Ms. Schone returns as scheduled.  She completed another cycle of Imfinzi 12/11/2018.  No diarrhea or skin rash.  Dyspnea is "okay" as long as she wears oxygen.  She reports increased back pain since her last appointment.  She is taking oxycodone 3 times a day without relief.  No leg weakness or numbness.  No bowel or bladder dysfunction.  Objective:  Vital signs in last 24 hours:  Blood pressure 114/68, pulse 85, temperature 98.6 F (37 C), temperature source Oral, resp. rate 18, height '5\' 2"'$  (1.575 m), weight 122 lb 6.4 oz (55.5 kg), SpO2 98 %.    HEENT: No thrush or ulcers. Resp: Distant breath sounds.  No respiratory distress. Cardio: Regular rate and rhythm. GI: Abdomen soft and nontender.  No hepatomegaly. Vascular: No leg edema. Neuro: Lower extremity motor strength 5/5.  Knee DTRs 1+, symmetric. Skin: No rash. Port-A-Cath without erythema.   Lab Results:  Lab Results  Component Value Date   WBC 7.0 12/25/2018   HGB 11.9 (L) 12/25/2018   HCT 35.5 (L) 12/25/2018   MCV 98.1 12/25/2018   PLT 167 12/25/2018   NEUTROABS 6.0 12/25/2018    Imaging:  No results found.  Medications: I have reviewed the patient's current medications.  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  Status postlaparoscopic appendectomy 09/12/2017. Postoperative course complicated by an ileus requiring TPN.   Final pathology of the appendix-5 cm adenocarcinoma/goblet cell carcinoid. The tumor extended through the muscularis propria into subserosal soft tissue and mesoappendix and focally to the serosal surface. Lymphovascular space invasion identified. Perineural invasion identified. Associated acute appendicitis  with perforation.  10/15/2017 CEA 1.  11/22/2017 chromogranin A-3  Status postlaparoscopic right hemicolectomy by Dr. Janace Litten 12/07/2017. No carcinomatosis or liver metastases were seen.   Final pathologyshowed metastatic adenocarcinoma/goblet cell carcinoid in 3 of 15 pericolic lymph nodes; maximum size of largest metastasis 0.2 cm; positive for extracapsular extension. Benign terminal ileum with fibrous serosal adhesions, negative for dysplasia or carcinoma; surgically absent appendix; appendectomy site with postsurgical changes and fibrous serosal adhesions, negative for carcinoma; proximal and distal mucosal margins negative for dysplasia or carcinoma; mesenteric resection margin negative for carcinoma.  Cycle 1 adjuvant Xeloda 01/07/2018  Cycle 2 adjuvant Xeloda 01/28/2018 2. Non-small cell lung cancer  PET [id number] hypermetabolic right upper lobe pulmonary nodules increased in size and metabolic activity. No mediastinal nodal metastasis. 4 mm right upper lobe nodule with metabolic activity, SUV 3.6. A third right upper lobe nodule measures 4 mm and has faint metabolic activity. No clear evidence of malignancy in the abdomen or pelvis. Mild activity along the ventral abdominal surgical scar. Scattered activity in small bowel without focal lesion on CT.  CT biopsy right upper lobe nodule 11/02/2017 showed non-small cell carcinoma; malignant cells positive for TTF-1 and negative for p63. PD-L1 95%.  Chest CT 01/21/2018-new mediastinal adenopathy seen in the prevascular and right paratracheal stations. Largest lymph node is seen in the prevascular space measuring 2.0 cm, previously 6 mm. No hilar or axillary adenopathy. Right lung nodules are stable.  Biopsy of a right paratracheal lymph node on 02/04/2018-metastatic adenocarcinoma consistent with a lung primary  Chest radiation started 03/11/2018  Week 1 Taxol/carboplatin 03/13/2018  Week 2 Taxol/carboplatin  03/20/2018  Week 3 Taxol/carboplatin 03/27/2018  Week4Taxol/carboplatin 04/03/2018  Week 5 Taxol/carboplatin 04/10/2018  Week 6 Taxol/carboplatin 04/19/2018  Chest radiation completed 04/25/2018  CT chest 05/23/2018-interval decrease in size of mediastinal adenopathy. Near complete resolution of previously visualized right pleural effusion. The right lung nodules have decreased in size. Other nodules are stable.  Cycle 1 durvalumab 05/23/2018  Cycle 2durvalumab 06/06/2018  Cycle 3 durvalumab 06/20/2018  Cycle 4 durvalumab 07/04/2018  Cycle 5 durvalumab 07/19/2018  Cycle 6 durvalumab 08/01/2018  Cycle 7 durvalumab 08/15/2018  CT chest 09/03/2018-collapse/consolidation with bronchiectasis and surrounding groundglass involving primarilytheright upper lobe, findings most indicative of recent radiation therapy. Previously measured right upper lobe nodule obscured. New areas of nodularity in the right lower lobe, likely infectious or inflammatory.  Cycle 8durvalumab10/07/2018  Cycle 9 durvalumab 09/18/2018  Cycle 10 durvalumab 10/02/2018  Cycle 11 durvalumab 10/16/2018   Cycle 12 durvalumab 11/13/2018  Cycle 13 durvalumab 11/30/2018  Cycle 14 durvalumab 12/11/2018  Cycle 15 durvalumab 12/25/2018   3. COPD  4.VATS drainage of a loculated right pleural effusion 03/01/2018  5.Port-A-Cath placement7/03/2018  6. Severe mid back pain 10/30/2018-CT thoracic spine- T5 compression fracture new since 09/03/2018- appearance consistent with an osteoporotic fracture.  Pain improved.  Increased back pain January 2020.  CT scan 12/16/2018- new compression fractures T5 and T7.  T5 vertebral body somewhat sclerotic in appearance.    Disposition: Ms. Salvi appears stable.  She has completed 14 cycles durvalumab.  Plan to proceed with cycle 15 today as scheduled.  She has increased back pain.  Recent CT scan showed new compression fractures at T5 and T7.  We are referring her for MRI of  the thoracic spine to further evaluate, question benign versus pathologic compression fractures.  She will return for lab and follow-up in 2 weeks.  We will adjust accordingly pending the result of the MRI.  Patient seen with Dr. Benay Spice.  Ned Card ANP/GNP-BC   12/25/2018  12:29 PM  This was a shared visit with Ned Card.  Mr. Pacifico was interviewed and examined.  She has increased back pain, likely secondary to compression fractures.  I think it is most likely the compression fractures are benign, but she could have metastatic disease involving the spine.  She will be referred for an MRI of the thoracic spine.  We will refer her for palliative radiation or a kyphoplasty procedure pending the MRI result.  She will continue durvalumab for now. Julieanne Manson, MD

## 2018-12-26 ENCOUNTER — Ambulatory Visit
Admission: RE | Admit: 2018-12-26 | Discharge: 2018-12-26 | Disposition: A | Discharge status: Home / Self Care | Payer: Medicare Other | Source: Ambulatory Visit | Attending: Oncology | Admitting: Oncology

## 2018-12-26 ENCOUNTER — Other Ambulatory Visit (HOSPITAL_COMMUNITY): Payer: Self-pay | Admitting: Oncology

## 2018-12-26 ENCOUNTER — Ambulatory Visit
Admission: RE | Admit: 2018-12-26 | Discharge: 2018-12-26 | Disposition: A | Discharge status: Home / Self Care | Payer: Self-pay | Source: Ambulatory Visit | Attending: Oncology | Admitting: Oncology

## 2018-12-26 DIAGNOSIS — C801 Malignant (primary) neoplasm, unspecified: Secondary | ICD-10-CM

## 2018-12-26 DIAGNOSIS — C189 Malignant neoplasm of colon, unspecified: Secondary | ICD-10-CM

## 2018-12-30 DIAGNOSIS — Z0001 Encounter for general adult medical examination with abnormal findings: Secondary | ICD-10-CM | POA: Diagnosis not present

## 2018-12-30 DIAGNOSIS — Z6821 Body mass index (BMI) 21.0-21.9, adult: Secondary | ICD-10-CM | POA: Diagnosis not present

## 2018-12-30 DIAGNOSIS — Z23 Encounter for immunization: Secondary | ICD-10-CM | POA: Diagnosis not present

## 2018-12-31 ENCOUNTER — Telehealth: Payer: Self-pay | Admitting: *Deleted

## 2018-12-31 ENCOUNTER — Other Ambulatory Visit: Payer: Self-pay | Admitting: Nurse Practitioner

## 2018-12-31 DIAGNOSIS — C3411 Malignant neoplasm of upper lobe, right bronchus or lung: Secondary | ICD-10-CM

## 2018-12-31 MED ORDER — OXYCODONE-ACETAMINOPHEN 5-325 MG PO TABS: 1.0000 | ORAL_TABLET | ORAL | 0 refills | Status: DC | PRN

## 2018-12-31 NOTE — Telephone Encounter (Signed)
Informed her that script has been sent to [facility] and next time she needs refills, call pharmacy.

## 2018-12-31 NOTE — Telephone Encounter (Signed)
Called to report needing refill on her percocet and that pharmacy will only dispense #42 pills and not the #60 MD orders. Confirmed to send to [facility] in [place]. Forwarded request to MD

## 2019-01-04 ENCOUNTER — Other Ambulatory Visit: Payer: Self-pay | Admitting: Oncology

## 2019-01-06 ENCOUNTER — Telehealth: Payer: Self-pay | Admitting: *Deleted

## 2019-01-06 NOTE — Telephone Encounter (Signed)
Patient notified of MRI appointment 2/12 at 1830/1900 at [facility] MRI department. Confirmed she is aware of the location.

## 2019-01-08 ENCOUNTER — Ambulatory Visit (HOSPITAL_COMMUNITY)
Admission: RE | Admit: 2019-01-08 | Discharge: 2019-01-08 | Disposition: A | Discharge status: Home / Self Care | Payer: Medicare Other | Source: Ambulatory Visit | Attending: Nurse Practitioner | Admitting: Nurse Practitioner

## 2019-01-08 ENCOUNTER — Other Ambulatory Visit: Payer: Self-pay | Admitting: *Deleted

## 2019-01-08 DIAGNOSIS — C3411 Malignant neoplasm of upper lobe, right bronchus or lung: Secondary | ICD-10-CM | POA: Diagnosis not present

## 2019-01-08 DIAGNOSIS — S22060A Wedge compression fracture of T7-T8 vertebra, initial encounter for closed fracture: Secondary | ICD-10-CM | POA: Diagnosis not present

## 2019-01-08 MED ORDER — GADOBUTROL 1 MMOL/ML IV SOLN
5.0000 mL | Freq: Once | INTRAVENOUS | Status: AC | PRN
  Administered 2019-01-08: 5 mL via INTRAVENOUS

## 2019-01-09 ENCOUNTER — Telehealth: Payer: Self-pay | Admitting: *Deleted

## 2019-01-09 ENCOUNTER — Inpatient Hospital Stay: Payer: Medicare Other

## 2019-01-09 ENCOUNTER — Other Ambulatory Visit: Payer: Self-pay | Admitting: Oncology

## 2019-01-09 ENCOUNTER — Inpatient Hospital Stay: Payer: Medicare Other | Admitting: Oncology

## 2019-01-09 ENCOUNTER — Telehealth: Payer: Self-pay | Admitting: Nurse Practitioner

## 2019-01-09 DIAGNOSIS — C3411 Malignant neoplasm of upper lobe, right bronchus or lung: Secondary | ICD-10-CM

## 2019-01-09 DIAGNOSIS — J111 Influenza due to unidentified influenza virus with other respiratory manifestations: Secondary | ICD-10-CM | POA: Diagnosis not present

## 2019-01-09 DIAGNOSIS — Z6821 Body mass index (BMI) 21.0-21.9, adult: Secondary | ICD-10-CM | POA: Diagnosis not present

## 2019-01-09 MED ORDER — OXYCODONE-ACETAMINOPHEN 5-325 MG PO TABS: 1.0000 | ORAL_TABLET | ORAL | 0 refills | Status: DC | PRN

## 2019-01-09 NOTE — Telephone Encounter (Signed)
Scheduled appt per 2/13 sch message - pt is aware of apt date and time

## 2019-01-09 NOTE — Telephone Encounter (Addendum)
Received call back from patient. She has seen another MD this morning has been diagnosed with the flu. She has picked up her Tamiflu at her pharmacy. Reviewed results of her MRI with her and advised she contact her PCP to f/u with compression fractures in 2 thoracic vertebrae. Also informed her that her appt with Dr. Benay Spice will be re-scheduled to 01/24/19 and that a scheduler will be contacting her with the exact time. Pt voiced understanding to all of the above.  Pt states that she will need a refill on her Percocet by Monday, 01/13/19. It was last filled on 12/31/18 for 42 tablets

## 2019-01-09 NOTE — Telephone Encounter (Signed)
Received message from [facility] on call service that pt is needing to cancel her appt this morning as she has a bad cold.  She also has COPD and is going to another MD appt to get her URI checked out.  TCT patient to advise her of MRI results from this week.  No evidence of cancer in her back.  She does have some compression fxs in in T5 and T7 but not d/t cancer. Left vm message for pt to call back regarding scan results.

## 2019-01-17 DIAGNOSIS — Z85118 Personal history of other malignant neoplasm of bronchus and lung: Secondary | ICD-10-CM | POA: Diagnosis not present

## 2019-01-17 DIAGNOSIS — Z79899 Other long term (current) drug therapy: Secondary | ICD-10-CM | POA: Diagnosis not present

## 2019-01-17 DIAGNOSIS — Z8501 Personal history of malignant neoplasm of esophagus: Secondary | ICD-10-CM | POA: Diagnosis not present

## 2019-01-17 DIAGNOSIS — R0602 Shortness of breath: Secondary | ICD-10-CM | POA: Diagnosis not present

## 2019-01-17 DIAGNOSIS — J441 Chronic obstructive pulmonary disease with (acute) exacerbation: Secondary | ICD-10-CM | POA: Diagnosis not present

## 2019-01-17 DIAGNOSIS — C3411 Malignant neoplasm of upper lobe, right bronchus or lung: Secondary | ICD-10-CM | POA: Diagnosis not present

## 2019-01-17 DIAGNOSIS — Z87891 Personal history of nicotine dependence: Secondary | ICD-10-CM | POA: Diagnosis not present

## 2019-01-17 DIAGNOSIS — R05 Cough: Secondary | ICD-10-CM | POA: Diagnosis not present

## 2019-01-17 DIAGNOSIS — Z6821 Body mass index (BMI) 21.0-21.9, adult: Secondary | ICD-10-CM | POA: Diagnosis not present

## 2019-01-17 DIAGNOSIS — R197 Diarrhea, unspecified: Secondary | ICD-10-CM | POA: Diagnosis not present

## 2019-01-17 DIAGNOSIS — R0600 Dyspnea, unspecified: Secondary | ICD-10-CM | POA: Diagnosis not present

## 2019-01-19 ENCOUNTER — Other Ambulatory Visit: Payer: Self-pay | Admitting: Oncology

## 2019-01-24 ENCOUNTER — Inpatient Hospital Stay: Payer: Medicare Other

## 2019-01-24 ENCOUNTER — Telehealth: Payer: Self-pay | Admitting: Nurse Practitioner

## 2019-01-24 ENCOUNTER — Inpatient Hospital Stay: Payer: Medicare Other | Attending: Nurse Practitioner | Admitting: Nurse Practitioner

## 2019-01-24 VITALS — BP 141/82 | HR 98 | Temp 98.6°F | Resp 18 | Ht 62.0 in | Wt 124.9 lb

## 2019-01-24 DIAGNOSIS — C3411 Malignant neoplasm of upper lobe, right bronchus or lung: Secondary | ICD-10-CM

## 2019-01-24 DIAGNOSIS — C7A020 Malignant carcinoid tumor of the appendix: Secondary | ICD-10-CM | POA: Insufficient documentation

## 2019-01-24 DIAGNOSIS — Z95828 Presence of other vascular implants and grafts: Secondary | ICD-10-CM

## 2019-01-24 DIAGNOSIS — Z923 Personal history of irradiation: Secondary | ICD-10-CM | POA: Insufficient documentation

## 2019-01-24 DIAGNOSIS — Z9221 Personal history of antineoplastic chemotherapy: Secondary | ICD-10-CM | POA: Diagnosis not present

## 2019-01-24 DIAGNOSIS — C7B01 Secondary carcinoid tumors of distant lymph nodes: Secondary | ICD-10-CM | POA: Diagnosis not present

## 2019-01-24 DIAGNOSIS — M8008XS Age-related osteoporosis with current pathological fracture, vertebra(e), sequela: Secondary | ICD-10-CM | POA: Insufficient documentation

## 2019-01-24 DIAGNOSIS — Z79899 Other long term (current) drug therapy: Secondary | ICD-10-CM | POA: Diagnosis not present

## 2019-01-24 LAB — CBC WITH DIFFERENTIAL (CANCER CENTER ONLY)
Abs Immature Granulocytes: 0.04 10*3/uL (ref 0.00–0.07)
Basophils Absolute: 0 10*3/uL (ref 0.0–0.1)
Basophils Relative: 0 %
Eosinophils Absolute: 0.1 10*3/uL (ref 0.0–0.5)
Eosinophils Relative: 1 %
HCT: 36.7 % (ref 36.0–46.0)
Hemoglobin: 11.8 g/dL — ABNORMAL LOW (ref 12.0–15.0)
Immature Granulocytes: 1 %
LYMPHS ABS: 0.5 10*3/uL — AB (ref 0.7–4.0)
LYMPHS PCT: 7 %
MCH: 32.6 pg (ref 26.0–34.0)
MCHC: 32.2 g/dL (ref 30.0–36.0)
MCV: 101.4 fL — ABNORMAL HIGH (ref 80.0–100.0)
Monocytes Absolute: 0.7 10*3/uL (ref 0.1–1.0)
Monocytes Relative: 10 %
Neutro Abs: 5.5 10*3/uL (ref 1.7–7.7)
Neutrophils Relative %: 81 %
Platelet Count: 178 10*3/uL (ref 150–400)
RBC: 3.62 MIL/uL — AB (ref 3.87–5.11)
RDW: 12 % (ref 11.5–15.5)
WBC Count: 6.8 10*3/uL (ref 4.0–10.5)
nRBC: 0 % (ref 0.0–0.2)

## 2019-01-24 LAB — CMP (CANCER CENTER ONLY)
ALT: 9 U/L (ref 0–44)
AST: 13 U/L — AB (ref 15–41)
Albumin: 3.2 g/dL — ABNORMAL LOW (ref 3.5–5.0)
Alkaline Phosphatase: 77 U/L (ref 38–126)
Anion gap: 8 (ref 5–15)
BUN: 8 mg/dL (ref 8–23)
CO2: 29 mmol/L (ref 22–32)
Calcium: 8.5 mg/dL — ABNORMAL LOW (ref 8.9–10.3)
Chloride: 105 mmol/L (ref 98–111)
Creatinine: 0.54 mg/dL (ref 0.44–1.00)
GFR, Est AFR Am: >60 mL/min (ref >60)
GFR, Estimated: >60 mL/min (ref >60)
Glucose, Bld: 84 mg/dL (ref 70–99)
Potassium: 4.1 mmol/L (ref 3.5–5.1)
Sodium: 142 mmol/L (ref 135–145)
Total Bilirubin: 0.3 mg/dL (ref 0.3–1.2)
Total Protein: 6.3 g/dL — ABNORMAL LOW (ref 6.5–8.1)

## 2019-01-24 MED ORDER — SODIUM CHLORIDE 0.9% FLUSH
10.0000 mL | Freq: Once | INTRAVENOUS | Status: AC
  Administered 2019-01-24: 10 mL
  Filled 2019-01-24: qty 10

## 2019-01-24 MED ORDER — HEPARIN SOD (PORK) LOCK FLUSH 100 UNIT/ML IV SOLN
500.0000 [IU] | Freq: Once | INTRAVENOUS | Status: AC
  Administered 2019-01-24: 500 [IU]
  Filled 2019-01-24: qty 5

## 2019-01-24 NOTE — Telephone Encounter (Signed)
Scheduled appt per 02/28 los.  Printed calendar and avs.

## 2019-01-24 NOTE — Progress Notes (Addendum)
[facility] OFFICE PROGRESS NOTE   Diagnosis: Non-small cell lung cancer  INTERVAL HISTORY:   Melissa Golden returns for follow-up.  She completed another cycle of Imfinzi 12/25/2018.  She canceled her appointment 01/09/2019 reporting a diagnosis of the flu.  She reports completing a course of Tamiflu and also doxycycline.  She overall feels better.  She has a persistent cough.  No fever.  No rash or diarrhea.  She is no longer having back pain.  Objective:  Vital signs in last 24 hours:  Blood pressure (!) 141/82, pulse 98, temperature 98.6 F (37 C), temperature source Oral, resp. rate 18, height 5' 2" (1.575 m), weight 124 lb 14.4 oz (56.7 kg), SpO2 98 %.    HEENT: No thrush or ulcers. Resp: Distant breath sounds.  No respiratory distress. Cardio: Regular rate and rhythm. GI: Abdomen soft and nontender.  No hepatomegaly. Vascular: No leg edema.  Skin: No rash. Port-A-Cath without erythema.   Lab Results:  Lab Results  Component Value Date   WBC 7.0 12/25/2018   HGB 11.9 (L) 12/25/2018   HCT 35.5 (L) 12/25/2018   MCV 98.1 12/25/2018   PLT 167 12/25/2018   NEUTROABS 6.0 12/25/2018    Imaging:  No results found.  Medications: I have reviewed the patient's current medications.  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  Status postlaparoscopic appendectomy 09/12/2017. Postoperative course complicated by an ileus requiring TPN.   Final pathology of the appendix-5 cm adenocarcinoma/goblet cell carcinoid. The tumor extended through the muscularis propria into subserosal soft tissue and mesoappendix and focally to the serosal surface. Lymphovascular space invasion identified. Perineural invasion identified. Associated acute appendicitis with perforation.  10/15/2017 CEA 1.  11/22/2017  chromogranin A-3  Status postlaparoscopic right hemicolectomy by Dr. St[facility]zenbergat [facility] 12/07/2017. No carcinomatosis or liver metastases were seen.   Final pathologyshowed metastatic adenocarcinoma/goblet cell carcinoid in 3 of 15 pericolic lymph nodes; maximum size of largest metastasis 0.2 cm; positive for extracapsular extension. Benign terminal ileum with fibrous serosal adhesions, negative for dysplasia or carcinoma; surgically absent appendix; appendectomy site with postsurgical changes and fibrous serosal adhesions, negative for carcinoma; proximal and distal mucosal margins negative for dysplasia or carcinoma; mesenteric resection margin negative for carcinoma.  Cycle 1 adjuvant Xeloda 01/07/2018  Cycle 2 adjuvant Xeloda 01/28/2018 2. Non-small cell lung cancer  PET scan11/21/2018-3 hypermetabolic right upper lobe pulmonary nodules increased in size and metabolic activity. No mediastinal nodal metastasis. 4 mm right upper lobe nodule with metabolic activity, SUV 3.6. A third right upper lobe nodule measures 4 mm and has faint metabolic activity. No clear evidence of malignancy in the abdomen or pelvis. Mild activity along the ventral abdominal surgical scar. Scattered activity in small bowel without focal lesion on CT.  CT biopsy right upper lobe nodule 11/02/2017 showed non-small cell carcinoma; malignant cells positive for TTF-1 and negative for p63. PD-L1 95%.  Chest CT 01/21/2018-new mediastinal adenopathy seen in the prevascular and right paratracheal stations. Largest lymph node is seen in the prevascular space measuring 2.0 cm, previously 6 mm. No hilar or axillary adenopathy. Right lung nodules are stable.  Biopsy of a right paratracheal lymph node on 02/04/2018-metastatic adenocarcinoma consistent with a lung primary  Chest radiation started 03/11/2018  Week 1 Taxol/carboplatin 03/13/2018  Week 2 Taxol/carboplatin 03/20/2018  Week 3 Taxol/carboplatin  03/27/2018  Week4Taxol/carboplatin 04/03/2018  Week 5 Taxol/carboplatin 04/10/2018  Week 6 Taxol/carboplatin   04/19/2018  Chest radiation completed 04/25/2018  CT chest 05/23/2018-interval decrease in size of mediastinal adenopathy. Near complete resolution of previously visualized right pleural effusion. The right lung nodules have decreased in size. Other nodules are stable.  Cycle 1 durvalumab 05/23/2018  Cycle 2durvalumab 06/06/2018  Cycle 3 durvalumab 06/20/2018  Cycle 4 durvalumab 07/04/2018  Cycle 5 durvalumab 07/19/2018  Cycle 6 durvalumab 08/01/2018  Cycle 7 durvalumab 08/15/2018  CT chest 09/03/2018-collapse/consolidation with bronchiectasis and surrounding groundglass involving primarilytheright upper lobe, findings most indicative of recent radiation therapy. Previously measured right upper lobe nodule obscured. New areas of nodularity in the right lower lobe, likely infectious or inflammatory.  Cycle 8durvalumab10/07/2018  Cycle 9 durvalumab 09/18/2018  Cycle 10 durvalumab 10/02/2018  Cycle 11 durvalumab 10/16/2018   Cycle 12 durvalumab 11/13/2018  Cycle 13 durvalumab 11/30/2018  Cycle 14 durvalumab1/15/2020  Cycle 15 durvalumab 12/25/2018   3. COPD  4.VATS drainage of a loculated right pleural effusion 03/01/2018  5.Port-A-Cath placement7/03/2018  6. Severe mid back pain 10/30/2018-CT thoracic spine-T5 compression fracture new since 09/03/2018-appearance consistent with an osteoporotic fracture. Pain improved.  Increased back pain January 2020.  CT scan 12/16/2018- new compression fractures T5 and T7.  T5 vertebral body somewhat sclerotic in appearance.  MRI thoracic spine 01/08/2019-acute compression fractures involving the T5 and T7 vertebral bodies stable relative to recent CT with up to 50% height loss at T5 and 25% height loss at T7.  No associated bony retropulsion.  Benign/mechanical features by MRI with no appreciable underlying pathologic  lesion.  No other evidence for metastatic disease within the thoracic spine.  Disposition: Melissa Golden appears unchanged.  She has completed 15 cycles of Imfinzi.  She did not receive an infusion 2 weeks ago due to a diagnosis of the "flu".  She is improving.  Plan to resume Imfinzi on 01/29/2019.  We reviewed the thoracic spine MRI result from 01/08/2019.  The compression fractures appear to be benign.  She will follow-up with her PCP to discuss osteoporosis therapy.  She will return for Imfinzi 01/29/2019.  She will return for lab, follow-up and Imfinzi on 02/12/2019.  She will contact the office in the interim with any problems.  Patient seen with Dr. Benay Spice.    Ned Card ANP/GNP-BC   01/24/2019  9:17 AM  Melissa Golden is in remission from non-small cell lung cancer.  She has benign compression fractures.  We recommended she follow-up with a primary physician for treatment of osteoporosis.  She will continue durvalumab beginning 01/29/2019.  Julieanne Manson, MD

## 2019-01-29 ENCOUNTER — Inpatient Hospital Stay: Payer: Medicare Other | Attending: Nurse Practitioner

## 2019-01-29 VITALS — BP 145/75 | HR 96 | Temp 98.6°F | Resp 20 | Wt 120.5 lb

## 2019-01-29 DIAGNOSIS — Z5112 Encounter for antineoplastic immunotherapy: Secondary | ICD-10-CM | POA: Insufficient documentation

## 2019-01-29 DIAGNOSIS — Z923 Personal history of irradiation: Secondary | ICD-10-CM | POA: Insufficient documentation

## 2019-01-29 DIAGNOSIS — C3411 Malignant neoplasm of upper lobe, right bronchus or lung: Secondary | ICD-10-CM | POA: Insufficient documentation

## 2019-01-29 DIAGNOSIS — M546 Pain in thoracic spine: Secondary | ICD-10-CM | POA: Insufficient documentation

## 2019-01-29 DIAGNOSIS — J449 Chronic obstructive pulmonary disease, unspecified: Secondary | ICD-10-CM | POA: Diagnosis not present

## 2019-01-29 DIAGNOSIS — Z8509 Personal history of malignant neoplasm of other digestive organs: Secondary | ICD-10-CM | POA: Insufficient documentation

## 2019-01-29 MED ORDER — SODIUM CHLORIDE 0.9% FLUSH
10.0000 mL | INTRAVENOUS | Status: DC | PRN
  Administered 2019-01-29: 10 mL
  Filled 2019-01-29: qty 10

## 2019-01-29 MED ORDER — HEPARIN SOD (PORK) LOCK FLUSH 100 UNIT/ML IV SOLN
500.0000 [IU] | Freq: Once | INTRAVENOUS | Status: AC | PRN
  Administered 2019-01-29: 500 [IU]
  Filled 2019-01-29: qty 5

## 2019-01-29 MED ORDER — SODIUM CHLORIDE 0.9 % IV SOLN
9.5000 mg/kg | Freq: Once | INTRAVENOUS | Status: AC
  Administered 2019-01-29: 500 mg via INTRAVENOUS
  Filled 2019-01-29: qty 10

## 2019-01-29 MED ORDER — SODIUM CHLORIDE 0.9 % IV SOLN
Freq: Once | INTRAVENOUS | Status: AC
  Administered 2019-01-29: 09:00:00 via INTRAVENOUS
  Filled 2019-01-29: qty 250

## 2019-01-29 NOTE — Patient Instructions (Signed)
[facility] Discharge Instructions for Patients Receiving Chemotherapy  Today you received the following chemotherapy agents: Imfinzi.  To help prevent nausea and vomiting after your treatment, we encourage you to take your nausea medication as directed.   If you develop nausea and vomiting that is not controlled by your nausea medication, call the clinic.   BELOW ARE SYMPTOMS THAT SHOULD BE REPORTED IMMEDIATELY:  *FEVER GREATER THAN 100.5 F  *CHILLS WITH OR WITHOUT FEVER  NAUSEA AND VOMITING THAT IS NOT CONTROLLED WITH YOUR NAUSEA MEDICATION  *UNUSUAL SHORTNESS OF BREATH  *UNUSUAL BRUISING OR BLEEDING  TENDERNESS IN MOUTH AND THROAT WITH OR WITHOUT PRESENCE OF ULCERS  *URINARY PROBLEMS  *BOWEL PROBLEMS  UNUSUAL RASH Items with * indicate a potential emergency and should be followed up as soon as possible.  Feel free to call the clinic should you have any questions or concerns. The clinic phone number is [phone].  Please show the CHEMO ALERT CARD at check-in to the Emergency Department and triage nurse.   

## 2019-02-09 ENCOUNTER — Other Ambulatory Visit: Payer: Self-pay | Admitting: Oncology

## 2019-02-10 DIAGNOSIS — Z1211 Encounter for screening for malignant neoplasm of colon: Secondary | ICD-10-CM | POA: Diagnosis not present

## 2019-02-12 ENCOUNTER — Inpatient Hospital Stay: Payer: Medicare Other

## 2019-02-12 ENCOUNTER — Other Ambulatory Visit: Payer: Self-pay

## 2019-02-12 ENCOUNTER — Ambulatory Visit: Payer: Medicare Other

## 2019-02-12 ENCOUNTER — Encounter: Payer: Self-pay | Admitting: Nurse Practitioner

## 2019-02-12 ENCOUNTER — Inpatient Hospital Stay (HOSPITAL_BASED_OUTPATIENT_CLINIC_OR_DEPARTMENT_OTHER): Payer: Medicare Other | Admitting: Nurse Practitioner

## 2019-02-12 VITALS — BP 154/87 | HR 92 | Temp 98.2°F | Resp 18 | Ht 62.0 in | Wt 120.6 lb

## 2019-02-12 DIAGNOSIS — M546 Pain in thoracic spine: Secondary | ICD-10-CM

## 2019-02-12 DIAGNOSIS — Z5112 Encounter for antineoplastic immunotherapy: Secondary | ICD-10-CM | POA: Diagnosis not present

## 2019-02-12 DIAGNOSIS — Z8509 Personal history of malignant neoplasm of other digestive organs: Secondary | ICD-10-CM

## 2019-02-12 DIAGNOSIS — C3411 Malignant neoplasm of upper lobe, right bronchus or lung: Secondary | ICD-10-CM

## 2019-02-12 DIAGNOSIS — J449 Chronic obstructive pulmonary disease, unspecified: Secondary | ICD-10-CM

## 2019-02-12 DIAGNOSIS — Z923 Personal history of irradiation: Secondary | ICD-10-CM

## 2019-02-12 DIAGNOSIS — R5383 Other fatigue: Secondary | ICD-10-CM

## 2019-02-12 DIAGNOSIS — Z5181 Encounter for therapeutic drug level monitoring: Secondary | ICD-10-CM

## 2019-02-12 LAB — CBC WITH DIFFERENTIAL (CANCER CENTER ONLY)
Abs Immature Granulocytes: 0.02 10*3/uL (ref 0.00–0.07)
Basophils Absolute: 0 10*3/uL (ref 0.0–0.1)
Basophils Relative: 0 %
Eosinophils Absolute: 0 10*3/uL (ref 0.0–0.5)
Eosinophils Relative: 0 %
HCT: 39.3 % (ref 36.0–46.0)
Hemoglobin: 12.8 g/dL (ref 12.0–15.0)
Immature Granulocytes: 0 %
Lymphocytes Relative: 9 %
Lymphs Abs: 0.6 10*3/uL — ABNORMAL LOW (ref 0.7–4.0)
MCH: 32.5 pg (ref 26.0–34.0)
MCHC: 32.6 g/dL (ref 30.0–36.0)
MCV: 99.7 fL (ref 80.0–100.0)
Monocytes Absolute: 0.5 10*3/uL (ref 0.1–1.0)
Monocytes Relative: 8 %
NEUTROS PCT: 83 %
Neutro Abs: 5.6 10*3/uL (ref 1.7–7.7)
Platelet Count: 174 10*3/uL (ref 150–400)
RBC: 3.94 MIL/uL (ref 3.87–5.11)
RDW: 11.6 % (ref 11.5–15.5)
WBC Count: 6.7 10*3/uL (ref 4.0–10.5)
nRBC: 0 % (ref 0.0–0.2)

## 2019-02-12 LAB — CMP (CANCER CENTER ONLY)
ALT: 13 U/L (ref 0–44)
AST: 16 U/L (ref 15–41)
Albumin: 3.8 g/dL (ref 3.5–5.0)
Alkaline Phosphatase: 89 U/L (ref 38–126)
Anion gap: 12 (ref 5–15)
BUN: 7 mg/dL — ABNORMAL LOW (ref 8–23)
CALCIUM: 9 mg/dL (ref 8.9–10.3)
CO2: 24 mmol/L (ref 22–32)
Chloride: 104 mmol/L (ref 98–111)
Creatinine: 0.64 mg/dL (ref 0.44–1.00)
GFR, Est AFR Am: >60 mL/min (ref >60)
GFR, Estimated: >60 mL/min (ref >60)
Glucose, Bld: 105 mg/dL — ABNORMAL HIGH (ref 70–99)
Potassium: 4 mmol/L (ref 3.5–5.1)
Sodium: 140 mmol/L (ref 135–145)
Total Bilirubin: 0.5 mg/dL (ref 0.3–1.2)
Total Protein: 7 g/dL (ref 6.5–8.1)

## 2019-02-12 MED ORDER — SODIUM CHLORIDE 0.9 % IV SOLN
9.5000 mg/kg | Freq: Once | INTRAVENOUS | Status: AC
  Administered 2019-02-12: 500 mg via INTRAVENOUS
  Filled 2019-02-12: qty 10

## 2019-02-12 MED ORDER — SODIUM CHLORIDE 0.9% FLUSH
10.0000 mL | INTRAVENOUS | Status: DC | PRN
  Administered 2019-02-12: 10 mL
  Filled 2019-02-12: qty 10

## 2019-02-12 MED ORDER — HEPARIN SOD (PORK) LOCK FLUSH 100 UNIT/ML IV SOLN
500.0000 [IU] | Freq: Once | INTRAVENOUS | Status: AC | PRN
  Administered 2019-02-12: 500 [IU]
  Filled 2019-02-12: qty 5

## 2019-02-12 MED ORDER — SODIUM CHLORIDE 0.9% FLUSH
10.0000 mL | Freq: Once | INTRAVENOUS | Status: DC
  Filled 2019-02-12: qty 10

## 2019-02-12 MED ORDER — SODIUM CHLORIDE 0.9 % IV SOLN
Freq: Once | INTRAVENOUS | Status: AC
  Administered 2019-02-12: 13:00:00 via INTRAVENOUS
  Filled 2019-02-12: qty 250

## 2019-02-12 NOTE — Progress Notes (Signed)
[place] OFFICE PROGRESS NOTE   Diagnosis: Non-small cell lung cancer  INTERVAL HISTORY:   [name] returns as scheduled.  She completed another cycle of Imfinzi 01/29/2019.  She had an episode of nausea earlier this morning.  Otherwise no nausea or vomiting.  No mouth sores.  No diarrhea.  No rash.  Stable dyspnea and cough.  No fever.  Back pain has resolved.  Objective:  Vital signs in last 24 hours:  Blood pressure (!) 154/87, pulse 92, temperature 98.2 F (36.8 C), temperature source Oral, resp. rate 18, height '5\' 2"'$  (1.575 m), weight 120 lb 9.6 oz (54.7 kg), SpO2 98 %.    HEENT: No thrush or ulcers. Resp: Lungs clear posterior lung fields.  Anteriorly expiratory rhonchi.  No respiratory distress. Cardio: Regular rate and rhythm. GI: Abdomen soft and nontender.  No hepatomegaly. Vascular: No leg edema.  Skin: No rash. Port-A-Cath without erythema.   Lab Results:  Lab Results  Component Value Date   WBC 6.7 02/12/2019   HGB 12.8 02/12/2019   HCT 39.3 02/12/2019   MCV 99.7 02/12/2019   PLT 174 02/12/2019   NEUTROABS 5.6 02/12/2019    Imaging:  No results found.  Medications: I have reviewed the patient's current medications.  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  Status postlaparoscopic appendectomy 09/12/2017. Postoperative course complicated by an ileus requiring TPN.   Final pathology of the appendix-5 cm adenocarcinoma/goblet cell carcinoid. The tumor extended through the muscularis propria into subserosal soft tissue and mesoappendix and focally to the serosal surface. Lymphovascular space invasion identified. Perineural invasion identified. Associated acute appendicitis with perforation.  10/15/2017 CEA 1.  11/22/2017 chromogranin A-3  Status postlaparoscopic  right hemicolectomy by Dr. Janace Litten 12/07/2017. No carcinomatosis or liver metastases were seen.   Final pathologyshowed metastatic adenocarcinoma/goblet cell carcinoid in 3 of 15 pericolic lymph nodes; maximum size of largest metastasis 0.2 cm; positive for extracapsular extension. Benign terminal ileum with fibrous serosal adhesions, negative for dysplasia or carcinoma; surgically absent appendix; appendectomy site with postsurgical changes and fibrous serosal adhesions, negative for carcinoma; proximal and distal mucosal margins negative for dysplasia or carcinoma; mesenteric resection margin negative for carcinoma.  Cycle 1 adjuvant Xeloda 01/07/2018  Cycle 2 adjuvant Xeloda 01/28/2018 2. Non-small cell lung cancer  PET [id number] hypermetabolic right upper lobe pulmonary nodules increased in size and metabolic activity. No mediastinal nodal metastasis. 4 mm right upper lobe nodule with metabolic activity, SUV 3.6. A third right upper lobe nodule measures 4 mm and has faint metabolic activity. No clear evidence of malignancy in the abdomen or pelvis. Mild activity along the ventral abdominal surgical scar. Scattered activity in small bowel without focal lesion on CT.  CT biopsy right upper lobe nodule 11/02/2017 showed non-small cell carcinoma; malignant cells positive for TTF-1 and negative for p63. PD-L1 95%.  Chest CT 01/21/2018-new mediastinal adenopathy seen in the prevascular and right paratracheal stations. Largest lymph node is seen in the prevascular space measuring 2.0 cm, previously 6 mm. No hilar or axillary adenopathy. Right lung nodules are stable.  Biopsy of a right paratracheal lymph node on 02/04/2018-metastatic adenocarcinoma consistent with a lung primary  Chest radiation started 03/11/2018  Week 1 Taxol/carboplatin 03/13/2018  Week 2 Taxol/carboplatin 03/20/2018  Week 3 Taxol/carboplatin 03/27/2018  Week4Taxol/carboplatin 04/03/2018  Week 5  Taxol/carboplatin 04/10/2018  Week 6 Taxol/carboplatin 04/19/2018  Chest radiation completed 04/25/2018  CT chest 05/23/2018-interval decrease in size of mediastinal adenopathy. Near complete resolution of previously visualized right pleural effusion. The right lung nodules have decreased in size. Other nodules are stable.  Cycle 1 durvalumab 05/23/2018  Cycle 2durvalumab 06/06/2018  Cycle 3 durvalumab 06/20/2018  Cycle 4 durvalumab 07/04/2018  Cycle 5 durvalumab 07/19/2018  Cycle 6 durvalumab 08/01/2018  Cycle 7 durvalumab 08/15/2018  CT chest 09/03/2018-collapse/consolidation with bronchiectasis and surrounding groundglass involving primarilytheright upper lobe, findings most indicative of recent radiation therapy. Previously measured right upper lobe nodule obscured. New areas of nodularity in the right lower lobe, likely infectious or inflammatory.  Cycle 8durvalumab10/07/2018  Cycle 9 durvalumab 09/18/2018  Cycle 10 durvalumab 10/02/2018  Cycle 11 durvalumab 10/16/2018   Cycle 12 durvalumab 11/13/2018  Cycle 13 durvalumab 11/30/2018  Cycle 14 durvalumab1/15/2020  CT chest 12/16/2018 [facility])- no mediastinal, axillary or hilar adenopathy.  Centrilobular emphysema.  Right apical volume loss with bronchiectasis traversing the area of atelectatic lung.  Elevation of the adjacent minor fissure due to volume loss.  Subpleural left upper lobe opacity, nonspecific, measuring 8.7 mm.  Compression fractures T5 and T7.  Cycle 15 durvalumab 12/25/2018  Cycle 16 durvalumab 01/29/2019  Cycle 17 durvalumab 02/12/2019   3. COPD  4.VATS drainage of a loculated right pleural effusion 03/01/2018  5.Port-A-Cath placement7/03/2018  6. Severe mid back pain 10/30/2018-CT thoracic spine-T5 compression fracture new since 09/03/2018-appearance consistent with an osteoporotic fracture. Pain improved.Increased back pain January 2020. CT scan 12/16/2018- new compression fractures  T5 and T7. T5 vertebral body somewhat sclerotic in appearance.  MRI thoracic spine 01/08/2019-acute compression fractures involving the T5 and T7 vertebral bodies stable relative to recent CT with up to 50% height loss at T5 and 25% height loss at T7.  No associated bony retropulsion.  Benign/mechanical features by MRI with no appreciable underlying pathologic lesion.  No other evidence for metastatic disease within the thoracic spine.  Disposition: Ms. Daquila appears stable.  She has completed 16 cycles of durvalumab.  There is no clinical evidence of disease progression.  Plan to proceed with cycle 17 today as scheduled.  She will return for lab, follow-up and the next cycle of durvalumab in 2 weeks.  She will contact the office in the interim with any problems.    Ned Card ANP/GNP-BC   02/12/2019  12:04 PM

## 2019-02-12 NOTE — Patient Instructions (Signed)
[facility] Discharge Instructions for Patients Receiving Chemotherapy  Today you received the following chemotherapy agents: Imfinzi.  To help prevent nausea and vomiting after your treatment, we encourage you to take your nausea medication as directed.   If you develop nausea and vomiting that is not controlled by your nausea medication, call the clinic.   BELOW ARE SYMPTOMS THAT SHOULD BE REPORTED IMMEDIATELY:  *FEVER GREATER THAN 100.5 F  *CHILLS WITH OR WITHOUT FEVER  NAUSEA AND VOMITING THAT IS NOT CONTROLLED WITH YOUR NAUSEA MEDICATION  *UNUSUAL SHORTNESS OF BREATH  *UNUSUAL BRUISING OR BLEEDING  TENDERNESS IN MOUTH AND THROAT WITH OR WITHOUT PRESENCE OF ULCERS  *URINARY PROBLEMS  *BOWEL PROBLEMS  UNUSUAL RASH Items with * indicate a potential emergency and should be followed up as soon as possible.  Feel free to call the clinic should you have any questions or concerns. The clinic phone number is [phone].  Please show the CHEMO ALERT CARD at check-in to the Emergency Department and triage nurse.   

## 2019-02-12 NOTE — Patient Instructions (Signed)
Coronavirus (COVID-19) Are you at risk?  Are you at risk for the Coronavirus (COVID-19)?  To be considered HIGH RISK for Coronavirus (COVID-19), you have to meet the following criteria:  . Traveled to [place], [place], [place], [place] or [place]; or in the [place] to [place], [place], [place], or [place]; and have fever, cough, and shortness of breath within the last 2 weeks of travel OR . Been in close contact with a person diagnosed with COVID-19 within the last 2 weeks and have fever, cough, and shortness of breath . IF YOU DO NOT MEET THESE CRITERIA, YOU ARE CONSIDERED LOW RISK FOR COVID-19.  What to do if you are HIGH RISK for COVID-19?  . If you are having a medical emergency, call 911. . Seek medical care right away. Before you go to a doctor's office, urgent care or emergency department, call ahead and tell them about your recent travel, contact with someone diagnosed with COVID-19, and your symptoms. You should receive instructions from your physician's office regarding next steps of care.  . When you arrive at healthcare provider, tell the healthcare staff immediately you have returned from visiting [place], [place], [place], [place] or [place]; or traveled in the [place] to [place], [place], [place], or [place]; in the last two weeks or you have been in close contact with a person diagnosed with COVID-19 in the last 2 weeks.   . Tell the health care staff about your symptoms: fever, cough and shortness of breath. . After you have been seen by a medical provider, you will be either: o Tested for (COVID-19) and discharged home on quarantine except to seek medical care if symptoms worsen, and asked to  - Stay home and avoid contact with others until you get your results (4-5 days)  - Avoid travel on public transportation if possible (such as bus, train, or airplane) or o Sent to the Emergency Department by EMS for evaluation, COVID-19 testing, and possible  admission depending on your condition and test results.  What to do if you are LOW RISK for COVID-19?  Reduce your risk of any infection by using the same precautions used for avoiding the common cold or flu:  . Wash your hands often with soap and warm water for at least 20 seconds.  If soap and water are not readily available, use an alcohol-based hand sanitizer with at least 60% alcohol.  . If coughing or sneezing, cover your mouth and nose by coughing or sneezing into the elbow areas of your shirt or coat, into a tissue or into your sleeve (not your hands). . Avoid shaking hands with others and consider head nods or verbal greetings only. . Avoid touching your eyes, nose, or mouth with unwashed hands.  . Avoid close contact with people who are sick. . Avoid places or events with large numbers of people in one location, like concerts or sporting events. . Carefully consider travel plans you have or are making. . If you are planning any travel outside or inside the [place], visit the CDC's Travelers' Health webpage for the latest health notices. . If you have some symptoms but not all symptoms, continue to monitor at home and seek medical attention if your symptoms worsen. . If you are having a medical emergency, call 911.   ADDITIONAL HEALTHCARE OPTIONS FOR PATIENTS  [facility] / e-Visit: [email or web address]         [facility]: [phone]  [facility]: [phone]                   [facility]: [phone]   

## 2019-02-13 ENCOUNTER — Telehealth: Payer: Self-pay | Admitting: Nurse Practitioner

## 2019-02-13 NOTE — Telephone Encounter (Signed)
Scheduled appt per 3/18 los - pt is aware of appt date and time

## 2019-02-23 ENCOUNTER — Other Ambulatory Visit: Payer: Self-pay | Admitting: Oncology

## 2019-02-26 ENCOUNTER — Inpatient Hospital Stay: Payer: Medicare Other

## 2019-02-26 ENCOUNTER — Inpatient Hospital Stay (HOSPITAL_BASED_OUTPATIENT_CLINIC_OR_DEPARTMENT_OTHER): Payer: Medicare Other | Admitting: Nurse Practitioner

## 2019-02-26 ENCOUNTER — Other Ambulatory Visit: Payer: Self-pay

## 2019-02-26 ENCOUNTER — Encounter: Payer: Self-pay | Admitting: Nurse Practitioner

## 2019-02-26 ENCOUNTER — Inpatient Hospital Stay: Payer: Medicare Other | Attending: Nurse Practitioner

## 2019-02-26 VITALS — BP 123/80 | HR 95 | Temp 97.8°F | Resp 18 | Ht 62.0 in | Wt 121.1 lb

## 2019-02-26 DIAGNOSIS — Z8509 Personal history of malignant neoplasm of other digestive organs: Secondary | ICD-10-CM | POA: Diagnosis not present

## 2019-02-26 DIAGNOSIS — J449 Chronic obstructive pulmonary disease, unspecified: Secondary | ICD-10-CM

## 2019-02-26 DIAGNOSIS — C3411 Malignant neoplasm of upper lobe, right bronchus or lung: Secondary | ICD-10-CM | POA: Insufficient documentation

## 2019-02-26 DIAGNOSIS — Z79899 Other long term (current) drug therapy: Secondary | ICD-10-CM | POA: Insufficient documentation

## 2019-02-26 DIAGNOSIS — Z5181 Encounter for therapeutic drug level monitoring: Secondary | ICD-10-CM

## 2019-02-26 DIAGNOSIS — M546 Pain in thoracic spine: Secondary | ICD-10-CM | POA: Insufficient documentation

## 2019-02-26 DIAGNOSIS — R5383 Other fatigue: Secondary | ICD-10-CM

## 2019-02-26 DIAGNOSIS — Z923 Personal history of irradiation: Secondary | ICD-10-CM

## 2019-02-26 DIAGNOSIS — Z5112 Encounter for antineoplastic immunotherapy: Secondary | ICD-10-CM | POA: Diagnosis not present

## 2019-02-26 DIAGNOSIS — Z95828 Presence of other vascular implants and grafts: Secondary | ICD-10-CM

## 2019-02-26 LAB — CBC WITH DIFFERENTIAL (CANCER CENTER ONLY)
Abs Immature Granulocytes: 0.02 10*3/uL (ref 0.00–0.07)
Basophils Absolute: 0 10*3/uL (ref 0.0–0.1)
Basophils Relative: 1 %
Eosinophils Absolute: 0 10*3/uL (ref 0.0–0.5)
Eosinophils Relative: 0 %
HCT: 39.9 % (ref 36.0–46.0)
Hemoglobin: 13.1 g/dL (ref 12.0–15.0)
Immature Granulocytes: 0 %
Lymphocytes Relative: 10 %
Lymphs Abs: 0.6 10*3/uL — ABNORMAL LOW (ref 0.7–4.0)
MCH: 32.3 pg (ref 26.0–34.0)
MCHC: 32.8 g/dL (ref 30.0–36.0)
MCV: 98.3 fL (ref 80.0–100.0)
Monocytes Absolute: 0.6 10*3/uL (ref 0.1–1.0)
Monocytes Relative: 10 %
Neutro Abs: 5.1 10*3/uL (ref 1.7–7.7)
Neutrophils Relative %: 79 %
Platelet Count: 166 10*3/uL (ref 150–400)
RBC: 4.06 MIL/uL (ref 3.87–5.11)
RDW: 11.4 % — ABNORMAL LOW (ref 11.5–15.5)
WBC Count: 6.4 10*3/uL (ref 4.0–10.5)
nRBC: 0 % (ref 0.0–0.2)

## 2019-02-26 LAB — CMP (CANCER CENTER ONLY)
ALT: 11 U/L (ref 0–44)
AST: 16 U/L (ref 15–41)
Albumin: 3.8 g/dL (ref 3.5–5.0)
Alkaline Phosphatase: 86 U/L (ref 38–126)
Anion gap: 10 (ref 5–15)
BUN: 9 mg/dL (ref 8–23)
CO2: 28 mmol/L (ref 22–32)
Calcium: 9.3 mg/dL (ref 8.9–10.3)
Chloride: 104 mmol/L (ref 98–111)
Creatinine: 0.63 mg/dL (ref 0.44–1.00)
GFR, Est AFR Am: >60 mL/min (ref >60)
GFR, Estimated: >60 mL/min (ref >60)
Glucose, Bld: 98 mg/dL (ref 70–99)
Potassium: 4.3 mmol/L (ref 3.5–5.1)
Sodium: 142 mmol/L (ref 135–145)
Total Bilirubin: 0.4 mg/dL (ref 0.3–1.2)
Total Protein: 7.3 g/dL (ref 6.5–8.1)

## 2019-02-26 LAB — TSH: TSH: 2.11 u[IU]/mL (ref 0.308–3.960)

## 2019-02-26 MED ORDER — HEPARIN SOD (PORK) LOCK FLUSH 100 UNIT/ML IV SOLN
500.0000 [IU] | Freq: Once | INTRAVENOUS | Status: AC | PRN
  Administered 2019-02-26: 500 [IU]
  Filled 2019-02-26: qty 5

## 2019-02-26 MED ORDER — SODIUM CHLORIDE 0.9% FLUSH
10.0000 mL | Freq: Once | INTRAVENOUS | Status: AC
  Administered 2019-02-26: 13:00:00 10 mL
  Filled 2019-02-26: qty 10

## 2019-02-26 MED ORDER — SODIUM CHLORIDE 0.9 % IV SOLN
9.5000 mg/kg | Freq: Once | INTRAVENOUS | Status: AC
  Administered 2019-02-26: 500 mg via INTRAVENOUS
  Filled 2019-02-26: qty 10

## 2019-02-26 MED ORDER — SODIUM CHLORIDE 0.9% FLUSH
10.0000 mL | INTRAVENOUS | Status: DC | PRN
  Administered 2019-02-26: 10 mL
  Filled 2019-02-26: qty 10

## 2019-02-26 MED ORDER — SODIUM CHLORIDE 0.9 % IV SOLN
Freq: Once | INTRAVENOUS | Status: AC
  Administered 2019-02-26: 15:00:00 via INTRAVENOUS
  Filled 2019-02-26: qty 250

## 2019-02-26 NOTE — Progress Notes (Signed)
[facility] OFFICE PROGRESS NOTE   Diagnosis: Non-small cell lung cancer  INTERVAL HISTORY:   Melissa Golden returns as scheduled.  She completed another cycle of Imfinzi 02/12/2019.  She denies diarrhea and rash.  Overall good appetite.  No change in baseline shortness of breath.  She continues supplemental oxygen.  She reports a persistent cough especially at nighttime.  No fever.  Back pain continues to be improved.  Objective:  Vital signs in last 24 hours:  Blood pressure 123/80, pulse 95, temperature 97.8 F (36.6 C), temperature source Oral, resp. rate 18, height _0  (1.575 m), weight 121 lb 1.6 oz (54.9 kg), SpO2 100 %, peak flow (!) 2 L/min.    HEENT: No thrush or ulcers. Resp: Respirations even and unlabored. Vascular: No leg edema. Neuro: Alert and oriented. Skin: No rash. Port-A-Cath without erythema.   Lab Results:  Lab Results  Component Value Date   WBC 6.4 02/26/2019   HGB 13.1 02/26/2019   HCT 39.9 02/26/2019   MCV 98.3 02/26/2019   PLT 166 02/26/2019   NEUTROABS 5.1 02/26/2019    Imaging:  No results found.  Medications: I have reviewed the patient's current medications.  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  Status postlaparoscopic appendectomy 09/12/2017. Postoperative course complicated by an ileus requiring TPN.   Final pathology of the appendix-5 cm adenocarcinoma/goblet cell carcinoid. The tumor extended through the muscularis propria into subserosal soft tissue and mesoappendix and focally to the serosal surface. Lymphovascular space invasion identified. Perineural invasion identified. Associated acute appendicitis with perforation.  10/15/2017 CEA 1.  11/22/2017 chromogranin A-3  Status postlaparoscopic right hemicolectomy by Dr. Janace Litten 12/07/2017. No carcinomatosis or liver metastases were seen.   Final pathologyshowed metastatic adenocarcinoma/goblet cell carcinoid in 3 of 15 pericolic lymph nodes; maximum size of largest metastasis 0.2 cm; positive for extracapsular extension. Benign terminal ileum with fibrous serosal adhesions, negative for dysplasia or carcinoma; surgically absent appendix; appendectomy site with postsurgical changes and fibrous serosal adhesions, negative for carcinoma; proximal and distal mucosal margins negative for dysplasia or carcinoma; mesenteric resection margin negative for carcinoma.  Cycle 1 adjuvant Xeloda 01/07/2018  Cycle 2 adjuvant Xeloda 01/28/2018 2. Non-small cell lung cancer  PET [id number]/50/3888-2 hypermetabolic right upper lobe pulmonary nodules increased in size and metabolic activity. No mediastinal nodal metastasis. 4 mm right upper lobe nodule with metabolic activity, SUV 3.6. A third right upper lobe nodule measures 4 mm and has faint metabolic activity. No clear evidence of malignancy in the abdomen or pelvis. Mild activity along the ventral abdominal surgical scar. Scattered activity in small bowel without focal lesion on CT.  CT biopsy right upper lobe nodule 11/02/2017 showed non-small cell carcinoma; malignant cells positive for TTF-1 and negative for p63. PD-L1 95%.  Chest CT 01/21/2018-new mediastinal adenopathy seen in the prevascular and right paratracheal stations. Largest lymph node is seen in the prevascular space measuring 2.0 cm, previously 6 mm. No hilar or axillary adenopathy. Right lung nodules are stable.  Biopsy of a right paratracheal lymph node on 02/04/2018-metastatic adenocarcinoma consistent with a lung primary  Chest radiation started 03/11/2018  Week 1 Taxol/carboplatin 03/13/2018  Week 2 Taxol/carboplatin 03/20/2018  Week 3 Taxol/carboplatin 03/27/2018  Week4Taxol/carboplatin 04/03/2018  Week 5 Taxol/carboplatin 04/10/2018  Week 6  Taxol/carboplatin 04/19/2018  Chest radiation completed 04/25/2018  CT chest 05/23/2018-interval decrease in size of mediastinal adenopathy. Near complete resolution  of previously visualized right pleural effusion. The right lung nodules have decreased in size. Other nodules are stable.  Cycle 1 durvalumab 05/23/2018  Cycle 2durvalumab 06/06/2018  Cycle 3 durvalumab 06/20/2018  Cycle 4 durvalumab 07/04/2018  Cycle 5 durvalumab 07/19/2018  Cycle 6 durvalumab 08/01/2018  Cycle 7 durvalumab 08/15/2018  CT chest 09/03/2018-collapse/consolidation with bronchiectasis and surrounding groundglass involving primarilytheright upper lobe, findings most indicative of recent radiation therapy. Previously measured right upper lobe nodule obscured. New areas of nodularity in the right lower lobe, likely infectious or inflammatory.  Cycle 8durvalumab10/07/2018  Cycle 9 durvalumab 09/18/2018  Cycle 10 durvalumab 10/02/2018  Cycle 11 durvalumab 10/16/2018   Cycle 12 durvalumab 11/13/2018  Cycle 13 durvalumab 11/30/2018  Cycle 14 durvalumab1/15/2020  CT chest 12/16/2018 [facility] no mediastinal, axillary or hilar adenopathy.  Centrilobular emphysema.  Right apical volume loss with bronchiectasis traversing the area of atelectatic lung.  Elevation of the adjacent minor fissure due to volume loss.  Subpleural left upper lobe opacity, nonspecific, measuring 8.7 mm.  Compression fractures T5 and T7.  Cycle 15 durvalumab 12/25/2018  Cycle 16 durvalumab 01/29/2019  Cycle 17 durvalumab 02/12/2019  Cycle 18 durvalumab 02/26/2019   3. COPD  4.VATS drainage of a loculated right pleural effusion 03/01/2018  5.Port-A-Cath placement7/03/2018  6. Severe mid back pain 10/30/2018-CT thoracic spine-T5 compression fracture new since 09/03/2018-appearance consistent with an osteoporotic fracture. Pain improved.Increased back pain January 2020. CT scan 12/16/2018-new compression fractures T5  and T7. T5 vertebral body somewhat sclerotic in appearance.MRI thoracic spine 01/08/2019-acute compression fractures involving the T5 and T7 vertebral bodies stable relative to recent CT with up to 50% height loss at T5 and 25% height loss at T7. No associated bony retropulsion. Benign/mechanical features by MRI with no appreciable underlying pathologic lesion. No other evidence for metastatic disease within the thoracic spine.   Disposition: Melissa Golden appears stable.  She has completed 17 cycles of durvalumab.  There is no clinical evidence of disease progression.  Plan to proceed with cycle 18 today as scheduled.  She will return for lab, follow-up and durvalumab in 2 weeks.  She will contact the office in the interim with any problems.    Ned Card ANP/GNP-BC   02/26/2019  1:42 PM

## 2019-02-26 NOTE — Patient Instructions (Signed)
[facility] Discharge Instructions for Patients Receiving Chemotherapy  Today you received the following chemotherapy agent: Imfinzi.  To help prevent nausea and vomiting after your treatment, we encourage you to take your nausea medication as directed.  If you develop nausea and vomiting that is not controlled by your nausea medication, call the clinic.   BELOW ARE SYMPTOMS THAT SHOULD BE REPORTED IMMEDIATELY:  *FEVER GREATER THAN 100.5 F  *CHILLS WITH OR WITHOUT FEVER  NAUSEA AND VOMITING THAT IS NOT CONTROLLED WITH YOUR NAUSEA MEDICATION  *UNUSUAL SHORTNESS OF BREATH  *UNUSUAL BRUISING OR BLEEDING  TENDERNESS IN MOUTH AND THROAT WITH OR WITHOUT PRESENCE OF ULCERS  *URINARY PROBLEMS  *BOWEL PROBLEMS  UNUSUAL RASH Items with * indicate a potential emergency and should be followed up as soon as possible.  Feel free to call the clinic should you have any questions or concerns. The clinic phone number is [phone].  Please show the [facility] at check-in to the Emergency Department and triage nurse.

## 2019-02-27 ENCOUNTER — Telehealth: Payer: Self-pay | Admitting: Nurse Practitioner

## 2019-02-27 NOTE — Telephone Encounter (Signed)
Scheduled appt per 4/1 los.  Patient aware of appt date and time.

## 2019-03-09 ENCOUNTER — Other Ambulatory Visit: Payer: Self-pay | Admitting: Oncology

## 2019-03-12 ENCOUNTER — Telehealth: Payer: Self-pay | Admitting: Oncology

## 2019-03-12 ENCOUNTER — Other Ambulatory Visit: Payer: Self-pay

## 2019-03-12 ENCOUNTER — Inpatient Hospital Stay: Payer: Medicare Other

## 2019-03-12 ENCOUNTER — Inpatient Hospital Stay (HOSPITAL_BASED_OUTPATIENT_CLINIC_OR_DEPARTMENT_OTHER): Payer: Medicare Other | Admitting: Oncology

## 2019-03-12 DIAGNOSIS — Z923 Personal history of irradiation: Secondary | ICD-10-CM

## 2019-03-12 DIAGNOSIS — Z79899 Other long term (current) drug therapy: Secondary | ICD-10-CM

## 2019-03-12 DIAGNOSIS — Z8509 Personal history of malignant neoplasm of other digestive organs: Secondary | ICD-10-CM

## 2019-03-12 DIAGNOSIS — C3411 Malignant neoplasm of upper lobe, right bronchus or lung: Secondary | ICD-10-CM | POA: Diagnosis not present

## 2019-03-12 DIAGNOSIS — J449 Chronic obstructive pulmonary disease, unspecified: Secondary | ICD-10-CM | POA: Diagnosis not present

## 2019-03-12 DIAGNOSIS — M546 Pain in thoracic spine: Secondary | ICD-10-CM | POA: Diagnosis not present

## 2019-03-12 DIAGNOSIS — Z95828 Presence of other vascular implants and grafts: Secondary | ICD-10-CM

## 2019-03-12 DIAGNOSIS — Z5112 Encounter for antineoplastic immunotherapy: Secondary | ICD-10-CM | POA: Diagnosis not present

## 2019-03-12 LAB — CMP (CANCER CENTER ONLY)
ALT: 10 U/L (ref 0–44)
AST: 15 U/L (ref 15–41)
Albumin: 3.7 g/dL (ref 3.5–5.0)
Alkaline Phosphatase: 79 U/L (ref 38–126)
Anion gap: 12 (ref 5–15)
BUN: 7 mg/dL — ABNORMAL LOW (ref 8–23)
CO2: 26 mmol/L (ref 22–32)
Calcium: 9.1 mg/dL (ref 8.9–10.3)
Chloride: 104 mmol/L (ref 98–111)
Creatinine: 0.64 mg/dL (ref 0.44–1.00)
GFR, Est AFR Am: >60 mL/min (ref >60)
GFR, Estimated: >60 mL/min (ref >60)
Glucose, Bld: 92 mg/dL (ref 70–99)
Potassium: 4.3 mmol/L (ref 3.5–5.1)
Sodium: 142 mmol/L (ref 135–145)
Total Bilirubin: 0.3 mg/dL (ref 0.3–1.2)
Total Protein: 7.1 g/dL (ref 6.5–8.1)

## 2019-03-12 LAB — CBC WITH DIFFERENTIAL (CANCER CENTER ONLY)
Abs Immature Granulocytes: 0.02 10*3/uL (ref 0.00–0.07)
Basophils Absolute: 0 10*3/uL (ref 0.0–0.1)
Basophils Relative: 1 %
Eosinophils Absolute: 0.1 10*3/uL (ref 0.0–0.5)
Eosinophils Relative: 1 %
HCT: 40 % (ref 36.0–46.0)
Hemoglobin: 12.9 g/dL (ref 12.0–15.0)
Immature Granulocytes: 0 %
Lymphocytes Relative: 12 %
Lymphs Abs: 0.7 10*3/uL (ref 0.7–4.0)
MCH: 31.8 pg (ref 26.0–34.0)
MCHC: 32.3 g/dL (ref 30.0–36.0)
MCV: 98.5 fL (ref 80.0–100.0)
Monocytes Absolute: 0.5 10*3/uL (ref 0.1–1.0)
Monocytes Relative: 9 %
Neutro Abs: 4.4 10*3/uL (ref 1.7–7.7)
Neutrophils Relative %: 77 %
Platelet Count: 167 10*3/uL (ref 150–400)
RBC: 4.06 MIL/uL (ref 3.87–5.11)
RDW: 11.3 % — ABNORMAL LOW (ref 11.5–15.5)
WBC Count: 5.8 10*3/uL (ref 4.0–10.5)
nRBC: 0 % (ref 0.0–0.2)

## 2019-03-12 MED ORDER — SODIUM CHLORIDE 0.9% FLUSH
10.0000 mL | Freq: Once | INTRAVENOUS | Status: AC
  Administered 2019-03-12: 10 mL
  Filled 2019-03-12: qty 10

## 2019-03-12 MED ORDER — SODIUM CHLORIDE 0.9 % IV SOLN
9.5000 mg/kg | Freq: Once | INTRAVENOUS | Status: AC
  Administered 2019-03-12: 500 mg via INTRAVENOUS
  Filled 2019-03-12: qty 10

## 2019-03-12 MED ORDER — LIDOCAINE-PRILOCAINE 2.5-2.5 % EX CREA: TOPICAL_CREAM | CUTANEOUS | 1 refills | Status: DC

## 2019-03-12 MED ORDER — SODIUM CHLORIDE 0.9 % IV SOLN
Freq: Once | INTRAVENOUS | Status: AC
  Administered 2019-03-12: 10:00:00 via INTRAVENOUS
  Filled 2019-03-12: qty 250

## 2019-03-12 MED ORDER — SODIUM CHLORIDE 0.9% FLUSH
10.0000 mL | INTRAVENOUS | Status: DC | PRN
  Administered 2019-03-12: 10 mL
  Filled 2019-03-12: qty 10

## 2019-03-12 MED ORDER — HEPARIN SOD (PORK) LOCK FLUSH 100 UNIT/ML IV SOLN
500.0000 [IU] | Freq: Once | INTRAVENOUS | Status: AC | PRN
  Administered 2019-03-12: 500 [IU]
  Filled 2019-03-12: qty 5

## 2019-03-12 NOTE — Telephone Encounter (Signed)
Scheduled appt per 4/15 los.

## 2019-03-12 NOTE — Progress Notes (Signed)
[facility] OFFICE PROGRESS NOTE   Diagnosis: Non-small cell lung cancer  INTERVAL HISTORY:   Melissa Golden returns as scheduled.  She was last treated with durvalumab on 02/26/2019.  The rash or diarrhea.  No new complaint.  Stable dyspnea.  Minimal back pain.  Objective:  Vital signs in last 24 hours:  Blood pressure (!) 144/76, pulse 99, temperature 98.8 F (37.1 C), temperature source Oral, resp. rate 16, height _0  (1.575 m), weight 120 lb 12.8 oz (54.8 kg), SpO2 96 %.    Portacath/PICC-without erythema  Lab Results:  Lab Results  Component Value Date   WBC 5.8 03/12/2019   HGB 12.9 03/12/2019   HCT 40.0 03/12/2019   MCV 98.5 03/12/2019   PLT 167 03/12/2019   NEUTROABS 4.4 03/12/2019    CMP  Lab Results  Component Value Date   NA 142 03/12/2019   K 4.3 03/12/2019   CL 104 03/12/2019   CO2 26 03/12/2019   GLUCOSE 92 03/12/2019   BUN 7 (L) 03/12/2019   CREATININE 0.64 03/12/2019   CALCIUM 9.1 03/12/2019   PROT 7.1 03/12/2019   ALBUMIN 3.7 03/12/2019   AST 15 03/12/2019   ALT 10 03/12/2019   ALKPHOS 79 03/12/2019   BILITOT 0.3 03/12/2019   GFRNONAA >60 03/12/2019   GFRAA >60 03/12/2019     Medications: I have reviewed the patient's current medications.   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  Status postlaparoscopic appendectomy 09/12/2017. Postoperative course complicated by an ileus requiring TPN.   Final pathology of the appendix-5 cm adenocarcinoma/goblet cell carcinoid. The tumor extended through the muscularis propria into subserosal soft tissue and mesoappendix and focally to the serosal surface. Lymphovascular space invasion identified. Perineural invasion identified. Associated acute appendicitis with perforation.  10/15/2017 CEA 1.  11/22/2017  chromogranin A-3  Status postlaparoscopic right hemicolectomy by Dr. Janace Litten 12/07/2017. No carcinomatosis or liver metastases were seen.   Final pathologyshowed metastatic adenocarcinoma/goblet cell carcinoid in 3 of 15 pericolic lymph nodes; maximum size of largest metastasis 0.2 cm; positive for extracapsular extension. Benign terminal ileum with fibrous serosal adhesions, negative for dysplasia or carcinoma; surgically absent appendix; appendectomy site with postsurgical changes and fibrous serosal adhesions, negative for carcinoma; proximal and distal mucosal margins negative for dysplasia or carcinoma; mesenteric resection margin negative for carcinoma.  Cycle 1 adjuvant Xeloda 01/07/2018  Cycle 2 adjuvant Xeloda 01/28/2018 2. Non-small cell lung cancer  PET [id number]/7195-9 hypermetabolic right upper lobe pulmonary nodules increased in size and metabolic activity. No mediastinal nodal metastasis. 4 mm right upper lobe nodule with metabolic activity, SUV 3.6. A third right upper lobe nodule measures 4 mm and has faint metabolic activity. No clear evidence of malignancy in the abdomen or pelvis. Mild activity along the ventral abdominal surgical scar. Scattered activity in small bowel without focal lesion on CT.  CT biopsy right upper lobe nodule 11/02/2017 showed non-small cell carcinoma; malignant cells positive for TTF-1 and negative for p63. PD-L1 95%.  Chest CT 01/21/2018-new mediastinal adenopathy seen in the prevascular and right paratracheal stations. Largest lymph node is seen in the prevascular space measuring 2.0 cm, previously 6 mm. No hilar or axillary adenopathy. Right lung nodules are stable.  Biopsy of a right paratracheal lymph node on 02/04/2018-metastatic adenocarcinoma consistent with a lung primary  Chest radiation started 03/11/2018  Week 1 Taxol/carboplatin 03/13/2018  Week 2 Taxol/carboplatin 03/20/2018  Week 3 Taxol/carboplatin 03/27/2018   Week4Taxol/carboplatin 04/03/2018  Week 5 Taxol/carboplatin 04/10/2018  Week 6 Taxol/carboplatin 04/19/2018  Chest radiation completed 04/25/2018  CT chest 05/23/2018-interval decrease in size of mediastinal adenopathy. Near complete resolution of previously visualized right pleural effusion. The right lung nodules have decreased in size. Other nodules are stable.  Cycle 1 durvalumab 05/23/2018  Cycle 2durvalumab 06/06/2018  Cycle 3 durvalumab 06/20/2018  Cycle 4 durvalumab 07/04/2018  Cycle 5 durvalumab 07/19/2018  Cycle 6 durvalumab 08/01/2018  Cycle 7 durvalumab 08/15/2018  CT chest 09/03/2018-collapse/consolidation with bronchiectasis and surrounding groundglass involving primarilytheright upper lobe, findings most indicative of recent radiation therapy. Previously measured right upper lobe nodule obscured. New areas of nodularity in the right lower lobe, likely infectious or inflammatory.  Cycle 8durvalumab10/07/2018  Cycle 9 durvalumab 09/18/2018  Cycle 10 durvalumab 10/02/2018  Cycle 11 durvalumab 10/16/2018   Cycle 12 durvalumab 11/13/2018  Cycle 13 durvalumab 11/30/2018  Cycle 14 durvalumab1/15/2020  CT chest 12/16/2018 [facility] no mediastinal, axillary or hilar adenopathy.  Centrilobular emphysema.  Right apical volume loss with bronchiectasis traversing the area of atelectatic lung.  Elevation of the adjacent minor fissure due to volume loss.  Subpleural left upper lobe opacity, nonspecific, measuring 8.7 mm.  Compression fractures T5 and T7.  Cycle 15 durvalumab 12/25/2018  Cycle 16 durvalumab 01/29/2019  Cycle 17 durvalumab 02/12/2019  Cycle 18 durvalumab 02/26/2019  Cycle 19 durvalumab 03/12/2019   3. COPD  4.VATS drainage of a loculated right pleural effusion 03/01/2018  5.Port-A-Cath placement7/03/2018  6. Severe mid back pain 10/30/2018-CT thoracic spine-T5 compression fracture new since 09/03/2018-appearance consistent with an  osteoporotic fracture. Pain improved.Increased back pain January 2020. CT scan 12/16/2018-new compression fractures T5 and T7. T5 vertebral body somewhat sclerotic in appearance.MRI thoracic spine 01/08/2019-acute compression fractures involving the T5 and T7 vertebral bodies stable relative to recent CT with up to 50% height loss at T5 and 25% height loss at T7. No associated bony retropulsion. Benign/mechanical features by MRI with no appreciable underlying pathologic lesion. No other evidence for metastatic disease within the thoracic spine.    Disposition:  Melissa Golden appears stable.  She is tolerating the durvalumab well.  She will complete another treatment today.  The plan is to complete 1 year of durvalumab. The liver panel is normal today and the TSH was normal on 02/26/2019.  She will return for an office visit and durvalumab in 2 weeks.  Betsy Coder, MD  03/12/2019  9:36 AM

## 2019-03-23 ENCOUNTER — Other Ambulatory Visit: Payer: Self-pay | Admitting: Oncology

## 2019-03-25 DIAGNOSIS — F419 Anxiety disorder, unspecified: Secondary | ICD-10-CM | POA: Diagnosis not present

## 2019-03-25 DIAGNOSIS — Z87891 Personal history of nicotine dependence: Secondary | ICD-10-CM | POA: Diagnosis not present

## 2019-03-25 DIAGNOSIS — Z6821 Body mass index (BMI) 21.0-21.9, adult: Secondary | ICD-10-CM | POA: Diagnosis not present

## 2019-03-25 DIAGNOSIS — M542 Cervicalgia: Secondary | ICD-10-CM | POA: Diagnosis not present

## 2019-03-25 DIAGNOSIS — N959 Unspecified menopausal and perimenopausal disorder: Secondary | ICD-10-CM | POA: Diagnosis not present

## 2019-03-25 DIAGNOSIS — C3411 Malignant neoplasm of upper lobe, right bronchus or lung: Secondary | ICD-10-CM | POA: Diagnosis not present

## 2019-03-25 DIAGNOSIS — J449 Chronic obstructive pulmonary disease, unspecified: Secondary | ICD-10-CM | POA: Diagnosis not present

## 2019-03-26 ENCOUNTER — Inpatient Hospital Stay: Payer: Medicare Other

## 2019-03-26 ENCOUNTER — Encounter: Payer: Self-pay | Admitting: Nurse Practitioner

## 2019-03-26 ENCOUNTER — Inpatient Hospital Stay (HOSPITAL_BASED_OUTPATIENT_CLINIC_OR_DEPARTMENT_OTHER): Payer: Medicare Other | Admitting: Nurse Practitioner

## 2019-03-26 ENCOUNTER — Other Ambulatory Visit: Payer: Self-pay

## 2019-03-26 VITALS — BP 141/80 | HR 96 | Temp 98.6°F | Resp 18 | Ht 62.0 in | Wt 121.4 lb

## 2019-03-26 DIAGNOSIS — C3411 Malignant neoplasm of upper lobe, right bronchus or lung: Secondary | ICD-10-CM

## 2019-03-26 DIAGNOSIS — J449 Chronic obstructive pulmonary disease, unspecified: Secondary | ICD-10-CM

## 2019-03-26 DIAGNOSIS — Z923 Personal history of irradiation: Secondary | ICD-10-CM | POA: Diagnosis not present

## 2019-03-26 DIAGNOSIS — M546 Pain in thoracic spine: Secondary | ICD-10-CM | POA: Diagnosis not present

## 2019-03-26 DIAGNOSIS — Z79899 Other long term (current) drug therapy: Secondary | ICD-10-CM | POA: Diagnosis not present

## 2019-03-26 DIAGNOSIS — Z5112 Encounter for antineoplastic immunotherapy: Secondary | ICD-10-CM | POA: Diagnosis not present

## 2019-03-26 DIAGNOSIS — Z8509 Personal history of malignant neoplasm of other digestive organs: Secondary | ICD-10-CM

## 2019-03-26 DIAGNOSIS — R5383 Other fatigue: Secondary | ICD-10-CM

## 2019-03-26 DIAGNOSIS — Z95828 Presence of other vascular implants and grafts: Secondary | ICD-10-CM

## 2019-03-26 LAB — CMP (CANCER CENTER ONLY)
ALT: 9 U/L (ref 0–44)
AST: 12 U/L — ABNORMAL LOW (ref 15–41)
Albumin: 3.4 g/dL — ABNORMAL LOW (ref 3.5–5.0)
Alkaline Phosphatase: 81 U/L (ref 38–126)
Anion gap: 7 (ref 5–15)
BUN: 8 mg/dL (ref 8–23)
CO2: 31 mmol/L (ref 22–32)
Calcium: 9.2 mg/dL (ref 8.9–10.3)
Chloride: 104 mmol/L (ref 98–111)
Creatinine: 0.62 mg/dL (ref 0.44–1.00)
GFR, Est AFR Am: >60 mL/min (ref >60)
GFR, Estimated: >60 mL/min (ref >60)
Glucose, Bld: 100 mg/dL — ABNORMAL HIGH (ref 70–99)
Potassium: 4.3 mmol/L (ref 3.5–5.1)
Sodium: 142 mmol/L (ref 135–145)
Total Bilirubin: 0.3 mg/dL (ref 0.3–1.2)
Total Protein: 6.7 g/dL (ref 6.5–8.1)

## 2019-03-26 MED ORDER — SODIUM CHLORIDE 0.9 % IV SOLN
Freq: Once | INTRAVENOUS | Status: AC
  Administered 2019-03-26: 13:00:00 via INTRAVENOUS
  Filled 2019-03-26: qty 250

## 2019-03-26 MED ORDER — SODIUM CHLORIDE 0.9 % IV SOLN
9.5000 mg/kg | Freq: Once | INTRAVENOUS | Status: AC
  Administered 2019-03-26: 13:00:00 500 mg via INTRAVENOUS
  Filled 2019-03-26: qty 10

## 2019-03-26 MED ORDER — SODIUM CHLORIDE 0.9% FLUSH
10.0000 mL | INTRAVENOUS | Status: DC | PRN
  Administered 2019-03-26: 10 mL
  Filled 2019-03-26: qty 10

## 2019-03-26 MED ORDER — HEPARIN SOD (PORK) LOCK FLUSH 100 UNIT/ML IV SOLN
500.0000 [IU] | Freq: Once | INTRAVENOUS | Status: AC | PRN
  Administered 2019-03-26: 14:00:00 500 [IU]
  Filled 2019-03-26: qty 5

## 2019-03-26 MED ORDER — SODIUM CHLORIDE 0.9% FLUSH
10.0000 mL | Freq: Once | INTRAVENOUS | Status: AC
  Administered 2019-03-26: 11:00:00 10 mL
  Filled 2019-03-26: qty 10

## 2019-03-26 NOTE — Patient Instructions (Signed)
[facility] Discharge Instructions for Patients Receiving Chemotherapy  Today you received the following chemotherapy agent: Imfinzi.  To help prevent nausea and vomiting after your treatment, we encourage you to take your nausea medication as directed.  If you develop nausea and vomiting that is not controlled by your nausea medication, call the clinic.   BELOW ARE SYMPTOMS THAT SHOULD BE REPORTED IMMEDIATELY:  *FEVER GREATER THAN 100.5 F  *CHILLS WITH OR WITHOUT FEVER  NAUSEA AND VOMITING THAT IS NOT CONTROLLED WITH YOUR NAUSEA MEDICATION  *UNUSUAL SHORTNESS OF BREATH  *UNUSUAL BRUISING OR BLEEDING  TENDERNESS IN MOUTH AND THROAT WITH OR WITHOUT PRESENCE OF ULCERS  *URINARY PROBLEMS  *BOWEL PROBLEMS  UNUSUAL RASH Items with * indicate a potential emergency and should be followed up as soon as possible.  Feel free to call the clinic should you have any questions or concerns. The clinic phone number is [phone].  Please show the [facility] at check-in to the Emergency Department and triage nurse.

## 2019-03-26 NOTE — Progress Notes (Signed)
Per Ned Card, NP, pt does not need a CBC for today's treatment.

## 2019-03-26 NOTE — Progress Notes (Signed)
[place] OFFICE PROGRESS NOTE   Diagnosis:  Non-small cell lung cancer  INTERVAL HISTORY:   Ms. Locey returns as scheduled.  She completed another cycle of durvalumab 03/12/2019.  She denies diarrhea.  No rash.  Dyspnea and cough are unchanged.  No back pain at present.  She occasionally has a "twinge" of pain.  She has a good appetite.  No fever.  Objective:  Vital signs in last 24 hours:  Blood pressure (!) 141/80, pulse 96, temperature 98.6 F (37 C), temperature source Oral, resp. rate 18, height '5\' 2"'$  (1.575 m), weight 121 lb 6.4 oz (55.1 kg), SpO2 98 %.    Vascular: No leg edema.  Skin: No rash. Port-A-Cath without erythema.   Lab Results:  Lab Results  Component Value Date   WBC 5.8 03/12/2019   HGB 12.9 03/12/2019   HCT 40.0 03/12/2019   MCV 98.5 03/12/2019   PLT 167 03/12/2019   NEUTROABS 4.4 03/12/2019    Imaging:  No results found.  Medications: I have reviewed the patient's current medications.  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  Status postlaparoscopic appendectomy 09/12/2017. Postoperative course complicated by an ileus requiring TPN.   Final pathology of the appendix-5 cm adenocarcinoma/goblet cell carcinoid. The tumor extended through the muscularis propria into subserosal soft tissue and mesoappendix and focally to the serosal surface. Lymphovascular space invasion identified. Perineural invasion identified. Associated acute appendicitis with perforation.  10/15/2017 CEA 1.  11/22/2017 chromogranin A-3  Status postlaparoscopic right hemicolectomy by Dr. Janace Litten 12/07/2017. No carcinomatosis or liver metastases were seen.   Final pathologyshowed metastatic adenocarcinoma/goblet cell carcinoid in 3 of 15 pericolic lymph nodes; maximum  size of largest metastasis 0.2 cm; positive for extracapsular extension. Benign terminal ileum with fibrous serosal adhesions, negative for dysplasia or carcinoma; surgically absent appendix; appendectomy site with postsurgical changes and fibrous serosal adhesions, negative for carcinoma; proximal and distal mucosal margins negative for dysplasia or carcinoma; mesenteric resection margin negative for carcinoma.  Cycle 1 adjuvant Xeloda 01/07/2018  Cycle 2 adjuvant Xeloda 01/28/2018 2. Non-small cell lung cancer  PET [id number] hypermetabolic right upper lobe pulmonary nodules increased in size and metabolic activity. No mediastinal nodal metastasis. 4 mm right upper lobe nodule with metabolic activity, SUV 3.6. A third right upper lobe nodule measures 4 mm and has faint metabolic activity. No clear evidence of malignancy in the abdomen or pelvis. Mild activity along the ventral abdominal surgical scar. Scattered activity in small bowel without focal lesion on CT.  CT biopsy right upper lobe nodule 11/02/2017 showed non-small cell carcinoma; malignant cells positive for TTF-1 and negative for p63. PD-L1 95%.  Chest CT 01/21/2018-new mediastinal adenopathy seen in the prevascular and right paratracheal stations. Largest lymph node is seen in the prevascular space measuring 2.0 cm, previously 6 mm. No hilar or axillary adenopathy. Right lung nodules are stable.  Biopsy of a right paratracheal lymph node on 02/04/2018-metastatic adenocarcinoma consistent with a lung primary  Chest radiation started 03/11/2018  Week 1 Taxol/carboplatin 03/13/2018  Week 2 Taxol/carboplatin 03/20/2018  Week 3 Taxol/carboplatin 03/27/2018  Week4Taxol/carboplatin 04/03/2018  Week 5 Taxol/carboplatin 04/10/2018  Week 6 Taxol/carboplatin 04/19/2018  Chest radiation completed 04/25/2018  CT chest 05/23/2018-interval decrease in size of mediastinal adenopathy. Near complete resolution of previously visualized  right pleural effusion. The right lung nodules have decreased in size. Other nodules are stable.  Cycle 1 durvalumab  05/23/2018  Cycle 2durvalumab 06/06/2018  Cycle 3 durvalumab 06/20/2018  Cycle 4 durvalumab 07/04/2018  Cycle 5 durvalumab 07/19/2018  Cycle 6 durvalumab 08/01/2018  Cycle 7 durvalumab 08/15/2018  CT chest 09/03/2018-collapse/consolidation with bronchiectasis and surrounding groundglass involving primarilytheright upper lobe, findings most indicative of recent radiation therapy. Previously measured right upper lobe nodule obscured. New areas of nodularity in the right lower lobe, likely infectious or inflammatory.  Cycle 8durvalumab10/07/2018  Cycle 9 durvalumab 09/18/2018  Cycle 10 durvalumab 10/02/2018  Cycle 11 durvalumab 10/16/2018   Cycle 12 durvalumab 11/13/2018  Cycle 13 durvalumab 11/30/2018  Cycle 14 durvalumab1/15/2020  [place] no mediastinal, axillary or hilar adenopathy. Centrilobular emphysema. Right apical volume loss with bronchiectasis traversing the area of atelectatic lung. Elevation of the adjacent minor fissure due to volume loss. Subpleural left upper lobe opacity, nonspecific, measuring 8.7 mm. Compression fractures T5 and T7.  Cycle 15 durvalumab 12/25/2018  Cycle 16 durvalumab 01/29/2019  Cycle 17 durvalumab 02/12/2019  Cycle 18 durvalumab 02/26/2019  Cycle 19 durvalumab 03/12/2019  Cycle 20 durvalumab 03/26/2019   3. COPD  4.VATS drainage of a loculated right pleural effusion 03/01/2018  5.Port-A-Cath placement7/03/2018  6. Severe mid back pain 10/30/2018-CT thoracic spine-T5 compression fracture new since 09/03/2018-appearance consistent with an osteoporotic fracture. Pain improved.Increased back pain January 2020. CT scan 12/16/2018-new compression fractures T5 and T7. T5 vertebral body somewhat sclerotic in appearance.MRI thoracic spine 01/08/2019-acute compression fractures involving  the T5 and T7 vertebral bodies stable relative to recent CT with up to 50% height loss at T5 and 25% height loss at T7. No associated bony retropulsion. Benign/mechanical features by MRI with no appreciable underlying pathologic lesion. No other evidence for metastatic disease within the thoracic spine.   Disposition: Ms. Kennyth Arnold appears unchanged.  She continues to tolerate durvalumab well.  Plan to proceed with treatment today as scheduled.    She will return for lab, follow-up and the next cycle at a 3-week interval rather than 2-week interval due to a colonoscopy.  She will contact the office in the interim with any problems.    Ned Card ANP/GNP-BC   03/26/2019  1:16 PM

## 2019-03-27 ENCOUNTER — Telehealth: Payer: Self-pay | Admitting: Nurse Practitioner

## 2019-03-27 NOTE — Telephone Encounter (Signed)
Scheduled appt per 4/29 los.

## 2019-04-08 DIAGNOSIS — Z1159 Encounter for screening for other viral diseases: Secondary | ICD-10-CM | POA: Diagnosis not present

## 2019-04-10 DIAGNOSIS — D122 Benign neoplasm of ascending colon: Secondary | ICD-10-CM | POA: Diagnosis not present

## 2019-04-10 DIAGNOSIS — Z8589 Personal history of malignant neoplasm of other organs and systems: Secondary | ICD-10-CM | POA: Diagnosis not present

## 2019-04-10 DIAGNOSIS — Z9049 Acquired absence of other specified parts of digestive tract: Secondary | ICD-10-CM | POA: Diagnosis not present

## 2019-04-10 DIAGNOSIS — Z90710 Acquired absence of both cervix and uterus: Secondary | ICD-10-CM | POA: Diagnosis not present

## 2019-04-10 DIAGNOSIS — Z85118 Personal history of other malignant neoplasm of bronchus and lung: Secondary | ICD-10-CM | POA: Diagnosis not present

## 2019-04-10 DIAGNOSIS — M810 Age-related osteoporosis without current pathological fracture: Secondary | ICD-10-CM | POA: Diagnosis not present

## 2019-04-10 DIAGNOSIS — Z1211 Encounter for screening for malignant neoplasm of colon: Secondary | ICD-10-CM | POA: Diagnosis not present

## 2019-04-10 DIAGNOSIS — H3530 Unspecified macular degeneration: Secondary | ICD-10-CM | POA: Diagnosis not present

## 2019-04-10 DIAGNOSIS — D128 Benign neoplasm of rectum: Secondary | ICD-10-CM | POA: Diagnosis not present

## 2019-04-10 DIAGNOSIS — K640 First degree hemorrhoids: Secondary | ICD-10-CM | POA: Diagnosis not present

## 2019-04-10 DIAGNOSIS — J449 Chronic obstructive pulmonary disease, unspecified: Secondary | ICD-10-CM | POA: Diagnosis not present

## 2019-04-13 ENCOUNTER — Other Ambulatory Visit: Payer: Self-pay | Admitting: Oncology

## 2019-04-16 ENCOUNTER — Inpatient Hospital Stay (HOSPITAL_BASED_OUTPATIENT_CLINIC_OR_DEPARTMENT_OTHER): Payer: Medicare Other | Admitting: Oncology

## 2019-04-16 ENCOUNTER — Inpatient Hospital Stay: Payer: Medicare Other | Attending: Oncology

## 2019-04-16 ENCOUNTER — Telehealth: Payer: Self-pay | Admitting: Oncology

## 2019-04-16 ENCOUNTER — Other Ambulatory Visit: Payer: Self-pay

## 2019-04-16 ENCOUNTER — Inpatient Hospital Stay: Payer: Medicare Other

## 2019-04-16 VITALS — BP 148/85 | HR 106 | Temp 97.9°F | Resp 20 | Ht 62.0 in | Wt 120.7 lb

## 2019-04-16 VITALS — HR 82

## 2019-04-16 DIAGNOSIS — Z79899 Other long term (current) drug therapy: Secondary | ICD-10-CM | POA: Insufficient documentation

## 2019-04-16 DIAGNOSIS — Z923 Personal history of irradiation: Secondary | ICD-10-CM | POA: Insufficient documentation

## 2019-04-16 DIAGNOSIS — Z5112 Encounter for antineoplastic immunotherapy: Secondary | ICD-10-CM | POA: Insufficient documentation

## 2019-04-16 DIAGNOSIS — Z8509 Personal history of malignant neoplasm of other digestive organs: Secondary | ICD-10-CM

## 2019-04-16 DIAGNOSIS — M546 Pain in thoracic spine: Secondary | ICD-10-CM

## 2019-04-16 DIAGNOSIS — J449 Chronic obstructive pulmonary disease, unspecified: Secondary | ICD-10-CM | POA: Insufficient documentation

## 2019-04-16 DIAGNOSIS — C3411 Malignant neoplasm of upper lobe, right bronchus or lung: Secondary | ICD-10-CM

## 2019-04-16 DIAGNOSIS — Z95828 Presence of other vascular implants and grafts: Secondary | ICD-10-CM

## 2019-04-16 DIAGNOSIS — R5383 Other fatigue: Secondary | ICD-10-CM

## 2019-04-16 LAB — TSH: TSH: 3.092 u[IU]/mL (ref 0.308–3.960)

## 2019-04-16 LAB — CMP (CANCER CENTER ONLY)
ALT: 12 U/L (ref 0–44)
AST: 14 U/L — ABNORMAL LOW (ref 15–41)
Albumin: 3.4 g/dL — ABNORMAL LOW (ref 3.5–5.0)
Alkaline Phosphatase: 83 U/L (ref 38–126)
Anion gap: 8 (ref 5–15)
BUN: 7 mg/dL — ABNORMAL LOW (ref 8–23)
CO2: 28 mmol/L (ref 22–32)
Calcium: 8.9 mg/dL (ref 8.9–10.3)
Chloride: 106 mmol/L (ref 98–111)
Creatinine: 0.62 mg/dL (ref 0.44–1.00)
GFR, Est AFR Am: >60 mL/min (ref >60)
GFR, Estimated: >60 mL/min (ref >60)
Glucose, Bld: 93 mg/dL (ref 70–99)
Potassium: 4 mmol/L (ref 3.5–5.1)
Sodium: 142 mmol/L (ref 135–145)
Total Bilirubin: 0.3 mg/dL (ref 0.3–1.2)
Total Protein: 6.6 g/dL (ref 6.5–8.1)

## 2019-04-16 LAB — CBC WITH DIFFERENTIAL (CANCER CENTER ONLY)
Abs Immature Granulocytes: 0.01 10*3/uL (ref 0.00–0.07)
Basophils Absolute: 0 10*3/uL (ref 0.0–0.1)
Basophils Relative: 1 %
Eosinophils Absolute: 0.1 10*3/uL (ref 0.0–0.5)
Eosinophils Relative: 1 %
HCT: 38.1 % (ref 36.0–46.0)
Hemoglobin: 12.4 g/dL (ref 12.0–15.0)
Immature Granulocytes: 0 %
Lymphocytes Relative: 14 %
Lymphs Abs: 0.6 10*3/uL — ABNORMAL LOW (ref 0.7–4.0)
MCH: 31.8 pg (ref 26.0–34.0)
MCHC: 32.5 g/dL (ref 30.0–36.0)
MCV: 97.7 fL (ref 80.0–100.0)
Monocytes Absolute: 0.5 10*3/uL (ref 0.1–1.0)
Monocytes Relative: 13 %
Neutro Abs: 3.1 10*3/uL (ref 1.7–7.7)
Neutrophils Relative %: 71 %
Platelet Count: 153 10*3/uL (ref 150–400)
RBC: 3.9 MIL/uL (ref 3.87–5.11)
RDW: 12 % (ref 11.5–15.5)
WBC Count: 4.3 10*3/uL (ref 4.0–10.5)
nRBC: 0 % (ref 0.0–0.2)

## 2019-04-16 MED ORDER — SODIUM CHLORIDE 0.9 % IV SOLN
9.5000 mg/kg | Freq: Once | INTRAVENOUS | Status: AC
  Administered 2019-04-16: 10:00:00 500 mg via INTRAVENOUS
  Filled 2019-04-16: qty 10

## 2019-04-16 MED ORDER — HEPARIN SOD (PORK) LOCK FLUSH 100 UNIT/ML IV SOLN
500.0000 [IU] | Freq: Once | INTRAVENOUS | Status: AC | PRN
  Administered 2019-04-16: 11:00:00 500 [IU]
  Filled 2019-04-16: qty 5

## 2019-04-16 MED ORDER — SODIUM CHLORIDE 0.9 % IV SOLN
Freq: Once | INTRAVENOUS | Status: AC
  Administered 2019-04-16: 10:00:00 via INTRAVENOUS
  Filled 2019-04-16: qty 250

## 2019-04-16 MED ORDER — SODIUM CHLORIDE 0.9% FLUSH
10.0000 mL | Freq: Once | INTRAVENOUS | Status: AC
  Administered 2019-04-16: 10 mL
  Filled 2019-04-16: qty 10

## 2019-04-16 MED ORDER — SODIUM CHLORIDE 0.9% FLUSH
10.0000 mL | INTRAVENOUS | Status: DC | PRN
  Administered 2019-04-16: 11:00:00 10 mL
  Filled 2019-04-16: qty 10

## 2019-04-16 NOTE — Patient Instructions (Signed)
[facility] Discharge Instructions for Patients Receiving Chemotherapy  Today you received the following chemotherapy agent: Imfinzi.  To help prevent nausea and vomiting after your treatment, we encourage you to take your nausea medication as directed.  If you develop nausea and vomiting that is not controlled by your nausea medication, call the clinic.   BELOW ARE SYMPTOMS THAT SHOULD BE REPORTED IMMEDIATELY:  *FEVER GREATER THAN 100.5 F  *CHILLS WITH OR WITHOUT FEVER  NAUSEA AND VOMITING THAT IS NOT CONTROLLED WITH YOUR NAUSEA MEDICATION  *UNUSUAL SHORTNESS OF BREATH  *UNUSUAL BRUISING OR BLEEDING  TENDERNESS IN MOUTH AND THROAT WITH OR WITHOUT PRESENCE OF ULCERS  *URINARY PROBLEMS  *BOWEL PROBLEMS  UNUSUAL RASH Items with * indicate a potential emergency and should be followed up as soon as possible.  Feel free to call the clinic should you have any questions or concerns. The clinic phone number is [phone].  Please show the [facility] at check-in to the Emergency Department and triage nurse.

## 2019-04-16 NOTE — Telephone Encounter (Signed)
Scheduled appt per 5/20 los. A calendar will be mailed out.

## 2019-04-16 NOTE — Progress Notes (Signed)
[place] OFFICE PROGRESS NOTE   Diagnosis: Non-small cell lung cancer  INTERVAL HISTORY:   [name] completed another treatment with durvalumab on 03/26/2019.  She tolerated treatment well.  No rash or diarrhea.  She underwent a colonoscopy last week.  She had nausea with the colonoscopy preparation.  She reports having a negative COVID test prior to procedure. No pain at present.  No change in dyspnea.  Objective:  Vital signs in last 24 hours:  Blood pressure (!) 148/85, pulse (!) 106, temperature 97.9 F (36.6 C), temperature source Oral, resp. rate 20, height _0  (1.575 m), weight 120 lb 11.2 oz (54.7 kg), SpO2 99 %.    Physical examination-not performed today secondary to distancing with the COVID pandemic  Portacath/PICC-without erythema  Lab Results:  Lab Results  Component Value Date   WBC 4.3 04/16/2019   HGB 12.4 04/16/2019   HCT 38.1 04/16/2019   MCV 97.7 04/16/2019   PLT 153 04/16/2019   NEUTROABS 3.1 04/16/2019    CMP  Lab Results  Component Value Date   NA 142 03/26/2019   K 4.3 03/26/2019   CL 104 03/26/2019   CO2 31 03/26/2019   GLUCOSE 100 (H) 03/26/2019   BUN 8 03/26/2019   CREATININE 0.62 03/26/2019   CALCIUM 9.2 03/26/2019   PROT 6.7 03/26/2019   ALBUMIN 3.4 (L) 03/26/2019   AST 12 (L) 03/26/2019   ALT 9 03/26/2019   ALKPHOS 81 03/26/2019   BILITOT 0.3 03/26/2019   GFRNONAA >60 03/26/2019   GFRAA >60 03/26/2019     Medications: I have reviewed the patient's current medications.   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  Status postlaparoscopic appendectomy 09/12/2017. Postoperative course complicated by an ileus requiring TPN.   Final pathology of the appendix-5 cm adenocarcinoma/goblet cell carcinoid. The tumor extended through the muscularis  propria into subserosal soft tissue and mesoappendix and focally to the serosal surface. Lymphovascular space invasion identified. Perineural invasion identified. Associated acute appendicitis with perforation.  10/15/2017 CEA 1.  11/22/2017 chromogranin A-3  Status postlaparoscopic right hemicolectomy by Dr. Janace Litten 12/07/2017. No carcinomatosis or liver metastases were seen.   Final pathologyshowed metastatic adenocarcinoma/goblet cell carcinoid in 3 of 15 pericolic lymph nodes; maximum size of largest metastasis 0.2 cm; positive for extracapsular extension. Benign terminal ileum with fibrous serosal adhesions, negative for dysplasia or carcinoma; surgically absent appendix; appendectomy site with postsurgical changes and fibrous serosal adhesions, negative for carcinoma; proximal and distal mucosal margins negative for dysplasia or carcinoma; mesenteric resection margin negative for carcinoma.  Cycle 1 adjuvant Xeloda 01/07/2018  Cycle 2 adjuvant Xeloda 01/28/2018 2. Non-small cell lung cancer  PET [id number] hypermetabolic right upper lobe pulmonary nodules increased in size and metabolic activity. No mediastinal nodal metastasis. 4 mm right upper lobe nodule with metabolic activity, SUV 3.6. A third right upper lobe nodule measures 4 mm and has faint metabolic activity. No clear evidence of malignancy in the abdomen or pelvis. Mild activity along the ventral abdominal surgical scar. Scattered activity in small bowel without focal lesion on CT.  CT biopsy right upper lobe nodule 11/02/2017 showed non-small cell carcinoma; malignant cells positive for TTF-1 and negative for p63. PD-L1 95%.  Chest CT 01/21/2018-new mediastinal adenopathy seen in the prevascular and right paratracheal stations. Largest lymph node is seen in the prevascular space measuring 2.0 cm, previously 6 mm. No hilar or  axillary adenopathy. Right lung nodules are stable.  Biopsy of a right  paratracheal lymph node on 02/04/2018-metastatic adenocarcinoma consistent with a lung primary  Chest radiation started 03/11/2018  Week 1 Taxol/carboplatin 03/13/2018  Week 2 Taxol/carboplatin 03/20/2018  Week 3 Taxol/carboplatin 03/27/2018  Week4Taxol/carboplatin 04/03/2018  Week 5 Taxol/carboplatin 04/10/2018  Week 6 Taxol/carboplatin 04/19/2018  Chest radiation completed 04/25/2018  CT chest 05/23/2018-interval decrease in size of mediastinal adenopathy. Near complete resolution of previously visualized right pleural effusion. The right lung nodules have decreased in size. Other nodules are stable.  Cycle 1 durvalumab 05/23/2018  Cycle 2durvalumab 06/06/2018  Cycle 3 durvalumab 06/20/2018  Cycle 4 durvalumab 07/04/2018  Cycle 5 durvalumab 07/19/2018  Cycle 6 durvalumab 08/01/2018  Cycle 7 durvalumab 08/15/2018  CT chest 09/03/2018-collapse/consolidation with bronchiectasis and surrounding groundglass involving primarilytheright upper lobe, findings most indicative of recent radiation therapy. Previously measured right upper lobe nodule obscured. New areas of nodularity in the right lower lobe, likely infectious or inflammatory.  Cycle 8durvalumab10/07/2018  Cycle 9 durvalumab 09/18/2018  Cycle 10 durvalumab 10/02/2018  Cycle 11 durvalumab 10/16/2018   Cycle 12 durvalumab 11/13/2018  Cycle 13 durvalumab 11/30/2018  Cycle 14 durvalumab1/15/2020  [place] [facility] [place] no mediastinal, axillary or hilar adenopathy. Centrilobular emphysema. Right apical volume loss with bronchiectasis traversing the area of atelectatic lung. Elevation of the adjacent minor fissure due to volume loss. Subpleural left upper lobe opacity, nonspecific, measuring 8.7 mm. Compression fractures T5 and T7.  Cycle 15 durvalumab 12/25/2018  Cycle 16 durvalumab 01/29/2019  Cycle 17 durvalumab 02/12/2019  Cycle 18 durvalumab 02/26/2019  Cycle 19 durvalumab 03/12/2019  Cycle 20  durvalumab 03/26/2019  Cycle 21 durvalumab 04/16/2019   3. COPD  4.VATS drainage of a loculated right pleural effusion 03/01/2018  5.Port-A-Cath placement7/03/2018  6. Severe mid back pain 10/30/2018-CT thoracic spine-T5 compression fracture new since 09/03/2018-appearance consistent with an osteoporotic fracture. Pain improved.Increased back pain January 2020. CT scan 12/16/2018-new compression fractures T5 and T7. T5 vertebral body somewhat sclerotic in appearance.MRI thoracic spine 01/08/2019-acute compression fractures involving the T5 and T7 vertebral bodies stable relative to recent CT with up to 50% height loss at T5 and 25% height loss at T7. No associated bony retropulsion. Benign/mechanical features by MRI with no appreciable underlying pathologic lesion. No other evidence for metastatic disease within the thoracic spine.     Disposition: Melissa Golden appears unchanged.  She will continue another treatment with durvalumab today.  She will return for office visit and durvalumab in 2 weeks.  She will complete the 1 year course of adjuvant durvalumab at the end of June.  She will be scheduled for a restaging CT and office visit in 05/14/2019.  Betsy Coder, MD  04/16/2019  8:43 AM

## 2019-04-27 ENCOUNTER — Other Ambulatory Visit: Payer: Self-pay | Admitting: Oncology

## 2019-04-30 ENCOUNTER — Inpatient Hospital Stay (HOSPITAL_BASED_OUTPATIENT_CLINIC_OR_DEPARTMENT_OTHER): Payer: Medicare Other | Admitting: Nurse Practitioner

## 2019-04-30 ENCOUNTER — Other Ambulatory Visit: Payer: Self-pay

## 2019-04-30 ENCOUNTER — Inpatient Hospital Stay: Payer: Medicare Other | Attending: Oncology

## 2019-04-30 ENCOUNTER — Encounter: Payer: Self-pay | Admitting: Nurse Practitioner

## 2019-04-30 ENCOUNTER — Inpatient Hospital Stay: Payer: Medicare Other

## 2019-04-30 VITALS — BP 151/82 | HR 96 | Temp 98.3°F | Resp 18 | Ht 62.0 in | Wt 120.5 lb

## 2019-04-30 DIAGNOSIS — C3411 Malignant neoplasm of upper lobe, right bronchus or lung: Secondary | ICD-10-CM

## 2019-04-30 DIAGNOSIS — J449 Chronic obstructive pulmonary disease, unspecified: Secondary | ICD-10-CM | POA: Diagnosis not present

## 2019-04-30 DIAGNOSIS — C772 Secondary and unspecified malignant neoplasm of intra-abdominal lymph nodes: Secondary | ICD-10-CM | POA: Diagnosis not present

## 2019-04-30 DIAGNOSIS — Z5112 Encounter for antineoplastic immunotherapy: Secondary | ICD-10-CM | POA: Diagnosis not present

## 2019-04-30 DIAGNOSIS — Z923 Personal history of irradiation: Secondary | ICD-10-CM | POA: Insufficient documentation

## 2019-04-30 DIAGNOSIS — J90 Pleural effusion, not elsewhere classified: Secondary | ICD-10-CM | POA: Insufficient documentation

## 2019-04-30 DIAGNOSIS — C181 Malignant neoplasm of appendix: Secondary | ICD-10-CM | POA: Insufficient documentation

## 2019-04-30 DIAGNOSIS — Z79899 Other long term (current) drug therapy: Secondary | ICD-10-CM | POA: Diagnosis not present

## 2019-04-30 DIAGNOSIS — Z95828 Presence of other vascular implants and grafts: Secondary | ICD-10-CM

## 2019-04-30 DIAGNOSIS — G893 Neoplasm related pain (acute) (chronic): Secondary | ICD-10-CM

## 2019-04-30 DIAGNOSIS — Z79891 Long term (current) use of opiate analgesic: Secondary | ICD-10-CM | POA: Diagnosis not present

## 2019-04-30 MED ORDER — OXYCODONE-ACETAMINOPHEN 5-325 MG PO TABS: 1.0000 | ORAL_TABLET | ORAL | 0 refills | Status: DC | PRN

## 2019-04-30 MED ORDER — SODIUM CHLORIDE 0.9% FLUSH
10.0000 mL | INTRAVENOUS | Status: DC | PRN
  Administered 2019-04-30: 10 mL
  Filled 2019-04-30: qty 10

## 2019-04-30 MED ORDER — SODIUM CHLORIDE 0.9 % IV SOLN
Freq: Once | INTRAVENOUS | Status: AC
  Administered 2019-04-30: 14:00:00 via INTRAVENOUS
  Filled 2019-04-30: qty 250

## 2019-04-30 MED ORDER — HEPARIN SOD (PORK) LOCK FLUSH 100 UNIT/ML IV SOLN
500.0000 [IU] | Freq: Once | INTRAVENOUS | Status: DC
  Filled 2019-04-30: qty 5

## 2019-04-30 MED ORDER — SODIUM CHLORIDE 0.9% FLUSH
10.0000 mL | Freq: Once | INTRAVENOUS | Status: AC
  Administered 2019-04-30: 10 mL
  Filled 2019-04-30: qty 10

## 2019-04-30 MED ORDER — HEPARIN SOD (PORK) LOCK FLUSH 100 UNIT/ML IV SOLN
500.0000 [IU] | Freq: Once | INTRAVENOUS | Status: AC | PRN
  Administered 2019-04-30: 500 [IU]
  Filled 2019-04-30: qty 5

## 2019-04-30 MED ORDER — SODIUM CHLORIDE 0.9 % IV SOLN
9.5000 mg/kg | Freq: Once | INTRAVENOUS | Status: AC
  Administered 2019-04-30: 500 mg via INTRAVENOUS
  Filled 2019-04-30: qty 10

## 2019-04-30 NOTE — Patient Instructions (Signed)
[facility] Discharge Instructions for Patients Receiving Chemotherapy  Today you received the following chemotherapy agent: Imfinzi.  To help prevent nausea and vomiting after your treatment, we encourage you to take your nausea medication as directed.  If you develop nausea and vomiting that is not controlled by your nausea medication, call the clinic.   BELOW ARE SYMPTOMS THAT SHOULD BE REPORTED IMMEDIATELY:  *FEVER GREATER THAN 100.5 F  *CHILLS WITH OR WITHOUT FEVER  NAUSEA AND VOMITING THAT IS NOT CONTROLLED WITH YOUR NAUSEA MEDICATION  *UNUSUAL SHORTNESS OF BREATH  *UNUSUAL BRUISING OR BLEEDING  TENDERNESS IN MOUTH AND THROAT WITH OR WITHOUT PRESENCE OF ULCERS  *URINARY PROBLEMS  *BOWEL PROBLEMS  UNUSUAL RASH Items with * indicate a potential emergency and should be followed up as soon as possible.  Feel free to call the clinic should you have any questions or concerns. The clinic phone number is [phone].  Please show the [facility] at check-in to the Emergency Department and triage nurse.

## 2019-04-30 NOTE — Progress Notes (Signed)
Okay to treat with labs from 5/20, no labs needed today per Cira Rue, NP.

## 2019-04-30 NOTE — Progress Notes (Signed)
[place]   [phone] [phone]   Clinic Follow up Note   Patient Care Team: Rory Percy, MD as PCP - General (Family Medicine) 04/30/2019  CHIEF COMPLAINT: f/u NSCLC   SUMMARY OF ONCOLOGIC HISTORY:   Malignant neoplasm of right upper lobe of lung ([place])   01/10/2018 Initial Diagnosis    Malignant neoplasm of right upper lobe of lung ([place])    05/23/2018 -  Chemotherapy    The patient had durvalumab (IMFINZI) 500 mg in sodium chloride 0.9 % 100 mL chemo infusion, 9.5 mg/kg = 540 mg, Intravenous,  Once, 21 of 23 cycles Administration: 500 mg (05/23/2018), 500 mg (06/06/2018), 500 mg (06/20/2018), 500 mg (07/04/2018), 500 mg (07/19/2018), 500 mg (08/01/2018), 500 mg (08/15/2018), 500 mg (09/04/2018), 500 mg (09/18/2018), 500 mg (10/02/2018), 500 mg (11/13/2018), 500 mg (10/16/2018), 500 mg (11/30/2018), 500 mg (12/11/2018), 500 mg (12/25/2018), 500 mg (01/29/2019), 500 mg (02/12/2019), 500 mg (02/26/2019), 500 mg (03/12/2019), 500 mg (03/26/2019), 500 mg (04/16/2019)  for chemotherapy treatment.      CURRENT THERAPY: durvalumab q2 weeks, s/p cycle 21  INTERVAL HISTORY: Ms. Busk returns today for f/u and treatment as scheduled. She feels good. Appetite is normal. Moderate fatigue is at baseline. She is able to complete all ADLs. Respiratory status is at baseline, no change in cough and dyspnea. O2 at 2 liters as normal. Denies chest pain. Denies fever, chills, n/v/c/d, bleeding, rash, or leg edema. She has generalized bruising. Back pain comes in waves, controlled well with percocet, she does not use frequently.    MEDICAL HISTORY:  Past Medical History:  Diagnosis Date  . Asthma   . Cancer ([place]) 2017   colon   chemo tx  . COPD (chronic obstructive pulmonary disease) ([place])    wears 3L home O2  . Ileus following gastrointestinal surgery ([place]) 09/12/2017   required TPN support  . Pleural effusion, right   . Shortness of breath    with exertion    SURGICAL  HISTORY: Past Surgical History:  Procedure Laterality Date  . ABDOMINAL HYSTERECTOMY    . CATARACT EXTRACTION W/ INTRAOCULAR LENS IMPLANT     Right  . CATARACT EXTRACTION W/PHACO  01/02/2013   Procedure: CATARACT EXTRACTION PHACO AND INTRAOCULAR LENS PLACEMENT (IOC);  Surgeon: Tonny Branch, MD;  Location: [facility];  Service: Ophthalmology;  Laterality: Left;  CDE: 16.19  . EYE SURGERY    . IR IMAGING GUIDED PORT INSERTION  05/31/2018  . IR THORACENTESIS ASP PLEURAL SPACE W/IMG GUIDE  02/19/2018  . LAPAROSCOPIC APPENDECTOMY  09/12/2017  . laparoscopic hemicolectomy Right 12/07/2017   DR. Stitzenberg and Barnes    . LYMPH NODE BIOPSY N/A 02/04/2018   Procedure: LYMPH NODE BIOPSY;  Surgeon: Gaye Pollack, MD;  Location: [facility];  Service: Thoracic;  Laterality: N/A;  . MEDIASTINOSCOPY N/A 02/04/2018   Procedure: MEDIASTINOSCOPY;  Surgeon: Gaye Pollack, MD;  Location: [facility];  Service: Thoracic;  Laterality: N/A;  . PLEURAL EFFUSION DRAINAGE Right 03/01/2018   Procedure: DRAINAGE OF PLEURAL EFFUSION;  Surgeon: Gaye Pollack, MD;  Location: [facility];  Service: Thoracic;  Laterality: Right;  Marland Kitchen VIDEO ASSISTED THORACOSCOPY Right 03/01/2018   Procedure: VIDEO ASSISTED THORACOSCOPY;  Surgeon: Gaye Pollack, MD;  Location: [facility];  Service: Thoracic;  Laterality: Right;    I have reviewed the social history and family history with the patient and they are unchanged from previous note.  ALLERGIES:  has No Known Allergies.  MEDICATIONS:  Current Outpatient Medications  Medication Sig Dispense Refill  . acetaminophen (TYLENOL) 500 MG tablet Take 2 tablets (1,000 mg total) by mouth every 6 (six) hours. 30 tablet 0  . albuterol (PROVENTIL HFA;VENTOLIN HFA) 108 (90 BASE) MCG/ACT inhaler Inhale 2 puffs into the lungs 3 (three) times daily. Shortness of Breath     . budesonide (PULMICORT) 180 MCG/ACT inhaler Inhale 2 puffs into the lungs 2 (two) times daily.    Marland Kitchen ibuprofen (ADVIL,MOTRIN) 200  MG tablet Take 400 mg by mouth every 6 (six) hours as needed for moderate pain. Pain     . lidocaine-prilocaine (EMLA) cream Apply to portacath 30 min to 1 hour prior to flush or infusions 30 g 1  . Multiple Vitamin (MULTIVITAMIN WITH MINERALS) TABS Take 1 tablet by mouth daily.    Marland Kitchen oxyCODONE-acetaminophen (PERCOCET/ROXICET) 5-325 MG tablet Take 1 tablet by mouth every 4 (four) hours as needed for severe pain. DO NOT DRIVE 42 tablet 0  . prochlorperazine (COMPAZINE) 5 MG tablet Take 1 tablet (5 mg total) by mouth every 6 (six) hours as needed for nausea or vomiting. 30 tablet 0   No current facility-administered medications for this visit.     PHYSICAL EXAMINATION: ECOG PERFORMANCE STATUS: 1 - Symptomatic but completely ambulatory  Vitals:   04/30/19 1128  BP: (!) 151/82  Pulse: 96  Resp: 18  Temp: 98.3 F (36.8 C)  SpO2: 99%   Filed Weights   04/30/19 1128  Weight: 120 lb 8 oz (54.7 kg)    GENERAL:alert, no distress and comfortable SKIN: generalized bruising to hands and upper extremities. No obvious rash  EYES: sclera clear LUNGS: respirations even and unlabored. On supplemental 2 L O2 via [place]  HEART: no lower extremity edema Musculoskeletal:no cyanosis of digits  NEURO: alert & oriented x 3 with fluent speech, normal gait PAC without erythema  Limited exam for covid19 outbreak   LABORATORY DATA:  I have reviewed the data as listed CBC Latest Ref Rng & Units 04/16/2019 03/12/2019 02/26/2019  WBC 4.0 - 10.5 K/uL 4.3 5.8 6.4  Hemoglobin 12.0 - 15.0 g/dL 12.4 12.9 13.1  Hematocrit 36.0 - 46.0 % 38.1 40.0 39.9  Platelets 150 - 400 K/uL 153 167 166     CMP Latest Ref Rng & Units 04/16/2019 03/26/2019 03/12/2019  Glucose 70 - 99 mg/dL 93 100(H) 92  BUN 8 - 23 mg/dL 7(L) 8 7(L)  Creatinine 0.44 - 1.00 mg/dL 0.62 0.62 0.64  Sodium 135 - 145 mmol/L 142 142 142  Potassium 3.5 - 5.1 mmol/L 4.0 4.3 4.3  Chloride 98 - 111 mmol/L 106 104 104  CO2 22 - 32 mmol/L '28 31 26  '$ Calcium  8.9 - 10.3 mg/dL 8.9 9.2 9.1  Total Protein 6.5 - 8.1 g/dL 6.6 6.7 7.1  Total Bilirubin 0.3 - 1.2 mg/dL 0.3 0.3 0.3  Alkaline Phos 38 - 126 U/L 83 81 79  AST 15 - 41 U/L 14(L) 12(L) 15  ALT 0 - 44 U/L '12 9 10      '$ RADIOGRAPHIC STUDIES: I have personally reviewed the radiological images as listed and agreed with the findings in the report. No results found.   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  Status postlaparoscopic appendectomy 09/12/2017. Postoperative course complicated by an ileus requiring TPN.   Final pathology of the appendix-5 cm adenocarcinoma/goblet cell carcinoid. The  tumor extended through the muscularis propria into subserosal soft tissue and mesoappendix and focally to the serosal surface. Lymphovascular space invasion identified. Perineural invasion identified. Associated acute appendicitis with perforation.  10/15/2017 CEA 1.  11/22/2017 chromogranin A-3  Status postlaparoscopic right hemicolectomy by Dr. Janace Litten 12/07/2017. No carcinomatosis or liver metastases were seen.   Final pathologyshowed metastatic adenocarcinoma/goblet cell carcinoid in 3 of 15 pericolic lymph nodes; maximum size of largest metastasis 0.2 cm; positive for extracapsular extension. Benign terminal ileum with fibrous serosal adhesions, negative for dysplasia or carcinoma; surgically absent appendix; appendectomy site with postsurgical changes and fibrous serosal adhesions, negative for carcinoma; proximal and distal mucosal margins negative for dysplasia or carcinoma; mesenteric resection margin negative for carcinoma.  Cycle 1 adjuvant Xeloda 01/07/2018  Cycle 2 adjuvant Xeloda 01/28/2018 2. Non-small cell lung cancer  PET [id number] hypermetabolic right upper lobe pulmonary nodules  increased in size and metabolic activity. No mediastinal nodal metastasis. 4 mm right upper lobe nodule with metabolic activity, SUV 3.6. A third right upper lobe nodule measures 4 mm and has faint metabolic activity. No clear evidence of malignancy in the abdomen or pelvis. Mild activity along the ventral abdominal surgical scar. Scattered activity in small bowel without focal lesion on CT.  CT biopsy right upper lobe nodule 11/02/2017 showed non-small cell carcinoma; malignant cells positive for TTF-1 and negative for p63. PD-L1 95%.  Chest CT 01/21/2018-new mediastinal adenopathy seen in the prevascular and right paratracheal stations. Largest lymph node is seen in the prevascular space measuring 2.0 cm, previously 6 mm. No hilar or axillary adenopathy. Right lung nodules are stable.  Biopsy of a right paratracheal lymph node on 02/04/2018-metastatic adenocarcinoma consistent with a lung primary  Chest radiation started 03/11/2018  Week 1 Taxol/carboplatin 03/13/2018  Week 2 Taxol/carboplatin 03/20/2018  Week 3 Taxol/carboplatin 03/27/2018  Week4Taxol/carboplatin 04/03/2018  Week 5 Taxol/carboplatin 04/10/2018  Week 6 Taxol/carboplatin 04/19/2018  Chest radiation completed 04/25/2018  CT chest 05/23/2018-interval decrease in size of mediastinal adenopathy. Near complete resolution of previously visualized right pleural effusion. The right lung nodules have decreased in size. Other nodules are stable.  Cycle 1 durvalumab 05/23/2018  Cycle 2durvalumab 06/06/2018  Cycle 3 durvalumab 06/20/2018  Cycle 4 durvalumab 07/04/2018  Cycle 5 durvalumab 07/19/2018  Cycle 6 durvalumab 08/01/2018  Cycle 7 durvalumab 08/15/2018  CT chest 09/03/2018-collapse/consolidation with bronchiectasis and surrounding groundglass involving primarilytheright upper lobe, findings most indicative of recent radiation therapy. Previously measured right upper lobe nodule obscured. New areas of nodularity in the  right lower lobe, likely infectious or inflammatory.  Cycle 8durvalumab10/07/2018  Cycle 9 durvalumab 09/18/2018  Cycle 10 durvalumab 10/02/2018  Cycle 11 durvalumab 10/16/2018   Cycle 12 durvalumab 11/13/2018  Cycle 13 durvalumab 11/30/2018  Cycle 14 durvalumab1/15/2020  [place] [facility] [facility]no mediastinal, axillary or hilar adenopathy. Centrilobular emphysema. Right apical volume loss with bronchiectasis traversing the area of atelectatic lung. Elevation of the adjacent minor fissure due to volume loss. Subpleural left upper lobe opacity, nonspecific, measuring 8.7 mm. Compression fractures T5 and T7.  Cycle 15 durvalumab 12/25/2018  Cycle 16 durvalumab 01/29/2019  Cycle 17 durvalumab 02/12/2019  Cycle 18 durvalumab 02/26/2019  Cycle 19 durvalumab 03/12/2019  Cycle 20 durvalumab 03/26/2019  Cycle 21 durvalumab 04/16/2019  Cycle 22 durvalumab 04/30/2019    3. COPD  4.VATS drainage of a loculated right pleural effusion 03/01/2018  5.Port-A-Cath placement7/03/2018  6. Severe mid back pain 10/30/2018-CT thoracic spine-T5 compression fracture new since 09/03/2018-appearance consistent with an osteoporotic fracture. Pain improved.Increased back pain January 2020.  CT scan 12/16/2018-new compression fractures T5 and T7. T5 vertebral body somewhat sclerotic in appearance.MRI thoracic spine 01/08/2019-acute compression fractures involving the T5 and T7 vertebral bodies stable relative to recent CT with up to 50% height loss at T5 and 25% height loss at T7. No associated bony retropulsion. Benign/mechanical features by MRI with no appreciable underlying pathologic lesion. No other evidence for metastatic disease within the thoracic spine.   Disposition: Ms. Schroepfer appears stable. She completed cycle 21 durvalumab. She continues to tolerate well. Her back pain flares occasionally, is well controlled on percocet. Her last prescription was filled in  February 2020. I will refill. No labs done today. Proceed with cycle 22 durvalumab. She will undergo restaging CT 05/12/19 then return for f/u with Dr. Benay Spice on 05/14/19 at which time she will complete her 1 year course of adjuvant durvalumab. I reviewed the plan with Dr. Benay Spice; patient understands the plan.   All questions were answered. The patient knows to call the clinic with any problems, questions or concerns. No barriers to learning was detected.     Alla Feeling, NP 04/30/19

## 2019-05-01 ENCOUNTER — Telehealth: Payer: Self-pay | Admitting: Nurse Practitioner

## 2019-05-01 NOTE — Telephone Encounter (Signed)
No los per 6/3.

## 2019-05-07 DIAGNOSIS — D126 Benign neoplasm of colon, unspecified: Secondary | ICD-10-CM | POA: Diagnosis not present

## 2019-05-10 ENCOUNTER — Other Ambulatory Visit: Payer: Self-pay | Admitting: Oncology

## 2019-05-12 ENCOUNTER — Other Ambulatory Visit: Payer: Self-pay

## 2019-05-12 ENCOUNTER — Encounter (HOSPITAL_COMMUNITY): Payer: Self-pay

## 2019-05-12 ENCOUNTER — Ambulatory Visit (HOSPITAL_COMMUNITY)
Admission: RE | Admit: 2019-05-12 | Discharge: 2019-05-12 | Disposition: A | Discharge status: Home / Self Care | Payer: Medicare Other | Source: Ambulatory Visit | Attending: Oncology | Admitting: Oncology

## 2019-05-12 DIAGNOSIS — C3411 Malignant neoplasm of upper lobe, right bronchus or lung: Secondary | ICD-10-CM | POA: Diagnosis not present

## 2019-05-12 DIAGNOSIS — J439 Emphysema, unspecified: Secondary | ICD-10-CM | POA: Diagnosis not present

## 2019-05-12 MED ORDER — SODIUM CHLORIDE (PF) 0.9 % IJ SOLN
INTRAMUSCULAR | Status: AC
  Filled 2019-05-12: qty 50

## 2019-05-12 MED ORDER — IOHEXOL 300 MG/ML  SOLN
75.0000 mL | Freq: Once | INTRAMUSCULAR | Status: AC | PRN
  Administered 2019-05-12: 13:00:00 75 mL via INTRAVENOUS

## 2019-05-12 MED ORDER — HEPARIN SOD (PORK) LOCK FLUSH 100 UNIT/ML IV SOLN
INTRAVENOUS | Status: AC
  Filled 2019-05-12: qty 5

## 2019-05-12 MED ORDER — HEPARIN SOD (PORK) LOCK FLUSH 100 UNIT/ML IV SOLN
500.0000 [IU] | Freq: Once | INTRAVENOUS | Status: AC
  Administered 2019-05-12: 500 [IU] via INTRAVENOUS

## 2019-05-14 ENCOUNTER — Other Ambulatory Visit: Payer: Self-pay

## 2019-05-14 ENCOUNTER — Inpatient Hospital Stay (HOSPITAL_BASED_OUTPATIENT_CLINIC_OR_DEPARTMENT_OTHER): Payer: Medicare Other | Admitting: Oncology

## 2019-05-14 ENCOUNTER — Inpatient Hospital Stay: Payer: Medicare Other

## 2019-05-14 ENCOUNTER — Other Ambulatory Visit: Payer: Self-pay | Admitting: *Deleted

## 2019-05-14 VITALS — BP 134/80 | HR 98 | Temp 99.0°F | Resp 18 | Ht 62.0 in | Wt 123.8 lb

## 2019-05-14 DIAGNOSIS — Z5112 Encounter for antineoplastic immunotherapy: Secondary | ICD-10-CM | POA: Diagnosis not present

## 2019-05-14 DIAGNOSIS — C3411 Malignant neoplasm of upper lobe, right bronchus or lung: Secondary | ICD-10-CM

## 2019-05-14 DIAGNOSIS — Z95828 Presence of other vascular implants and grafts: Secondary | ICD-10-CM

## 2019-05-14 DIAGNOSIS — C181 Malignant neoplasm of appendix: Secondary | ICD-10-CM | POA: Diagnosis not present

## 2019-05-14 DIAGNOSIS — Z923 Personal history of irradiation: Secondary | ICD-10-CM | POA: Diagnosis not present

## 2019-05-14 DIAGNOSIS — C772 Secondary and unspecified malignant neoplasm of intra-abdominal lymph nodes: Secondary | ICD-10-CM | POA: Diagnosis not present

## 2019-05-14 DIAGNOSIS — G893 Neoplasm related pain (acute) (chronic): Secondary | ICD-10-CM | POA: Diagnosis not present

## 2019-05-14 LAB — CBC WITH DIFFERENTIAL (CANCER CENTER ONLY)
Abs Immature Granulocytes: 0.01 10*3/uL (ref 0.00–0.07)
Basophils Absolute: 0 10*3/uL (ref 0.0–0.1)
Basophils Relative: 0 %
Eosinophils Absolute: 0 10*3/uL (ref 0.0–0.5)
Eosinophils Relative: 1 %
HCT: 39.5 % (ref 36.0–46.0)
Hemoglobin: 12.9 g/dL (ref 12.0–15.0)
Immature Granulocytes: 0 %
Lymphocytes Relative: 11 %
Lymphs Abs: 0.6 10*3/uL — ABNORMAL LOW (ref 0.7–4.0)
MCH: 31.5 pg (ref 26.0–34.0)
MCHC: 32.7 g/dL (ref 30.0–36.0)
MCV: 96.3 fL (ref 80.0–100.0)
Monocytes Absolute: 0.5 10*3/uL (ref 0.1–1.0)
Monocytes Relative: 9 %
Neutro Abs: 4.3 10*3/uL (ref 1.7–7.7)
Neutrophils Relative %: 79 %
Platelet Count: 158 10*3/uL (ref 150–400)
RBC: 4.1 MIL/uL (ref 3.87–5.11)
RDW: 12.1 % (ref 11.5–15.5)
WBC Count: 5.5 10*3/uL (ref 4.0–10.5)
nRBC: 0 % (ref 0.0–0.2)

## 2019-05-14 LAB — CMP (CANCER CENTER ONLY)
ALT: 11 U/L (ref 0–44)
AST: 17 U/L (ref 15–41)
Albumin: 3.7 g/dL (ref 3.5–5.0)
Alkaline Phosphatase: 84 U/L (ref 38–126)
Anion gap: 10 (ref 5–15)
BUN: 6 mg/dL — ABNORMAL LOW (ref 8–23)
CO2: 28 mmol/L (ref 22–32)
Calcium: 8.8 mg/dL — ABNORMAL LOW (ref 8.9–10.3)
Chloride: 104 mmol/L (ref 98–111)
Creatinine: 0.6 mg/dL (ref 0.44–1.00)
GFR, Est AFR Am: >60 mL/min (ref >60)
GFR, Estimated: >60 mL/min (ref >60)
Glucose, Bld: 95 mg/dL (ref 70–99)
Potassium: 4 mmol/L (ref 3.5–5.1)
Sodium: 142 mmol/L (ref 135–145)
Total Bilirubin: 0.4 mg/dL (ref 0.3–1.2)
Total Protein: 7 g/dL (ref 6.5–8.1)

## 2019-05-14 MED ORDER — SODIUM CHLORIDE 0.9% FLUSH
10.0000 mL | INTRAVENOUS | Status: DC | PRN
  Administered 2019-05-14: 10 mL
  Filled 2019-05-14: qty 10

## 2019-05-14 MED ORDER — SODIUM CHLORIDE 0.9 % IV SOLN
9.5000 mg/kg | Freq: Once | INTRAVENOUS | Status: AC
  Administered 2019-05-14: 13:00:00 500 mg via INTRAVENOUS
  Filled 2019-05-14: qty 10

## 2019-05-14 MED ORDER — SODIUM CHLORIDE 0.9% FLUSH
10.0000 mL | Freq: Once | INTRAVENOUS | Status: AC
  Administered 2019-05-14: 11:00:00 10 mL
  Filled 2019-05-14: qty 10

## 2019-05-14 MED ORDER — SODIUM CHLORIDE 0.9 % IV SOLN
Freq: Once | INTRAVENOUS | Status: AC
  Administered 2019-05-14: 12:00:00 via INTRAVENOUS
  Filled 2019-05-14: qty 250

## 2019-05-14 MED ORDER — HEPARIN SOD (PORK) LOCK FLUSH 100 UNIT/ML IV SOLN
500.0000 [IU] | Freq: Once | INTRAVENOUS | Status: AC | PRN
  Administered 2019-05-14: 500 [IU]
  Filled 2019-05-14: qty 5

## 2019-05-14 NOTE — Progress Notes (Signed)
[facility] OFFICE PROGRESS NOTE   Diagnosis: Non-small cell lung cancer  INTERVAL HISTORY:   Ms. Stigler completed another treatment with durvalumab on 04/30/2019.  No rash or diarrhea.  Good appetite.  Table dyspnea.  Back pain remains much improved. She had a colonoscopy last month and reports polyps were removed from the colon.  She had nausea after taking the colonoscopy preparation.  Objective:  Vital signs in last 24 hours:  Blood pressure 134/80, pulse 98, temperature 99 F (37.2 C), temperature source Oral, resp. rate 18, height '5\' 2"'$  (1.575 m), weight 123 lb 12.8 oz (56.2 kg), SpO2 100 %.   Limited physical examination secondary to distancing with the COVID pandemic Lymphatics: No cervical or supraclavicular nodes Vascular: No leg edema   Portacath/PICC-without erythema  Lab Results:  Lab Results  Component Value Date   WBC 5.5 05/14/2019   HGB 12.9 05/14/2019   HCT 39.5 05/14/2019   MCV 96.3 05/14/2019   PLT 158 05/14/2019   NEUTROABS 4.3 05/14/2019    CMP  Lab Results  Component Value Date   NA 142 05/14/2019   K 4.0 05/14/2019   CL 104 05/14/2019   CO2 28 05/14/2019   GLUCOSE 95 05/14/2019   BUN 6 (L) 05/14/2019   CREATININE 0.60 05/14/2019   CALCIUM 8.8 (L) 05/14/2019   PROT 7.0 05/14/2019   ALBUMIN 3.7 05/14/2019   AST 17 05/14/2019   ALT 11 05/14/2019   ALKPHOS 84 05/14/2019   BILITOT 0.4 05/14/2019   GFRNONAA >60 05/14/2019   GFRAA >60 05/14/2019    Imaging:  Ct Chest W Contrast  Result Date: 05/12/2019 CLINICAL DATA:  Right upper lobe lung cancer status post chemotherapy and radiation therapy with ongoing immunotherapy. Restaging. Additional history of resected appendiceal carcinoma. EXAM: CT CHEST WITH CONTRAST TECHNIQUE: Multidetector CT imaging of the chest was performed during intravenous contrast administration. CONTRAST:  81m OMNIPAQUE IOHEXOL 300 MG/ML  SOLN COMPARISON:  12/16/2018 chest CT. FINDINGS: Cardiovascular:  Normal heart size. No significant pericardial effusion/thickening. Three-vessel coronary atherosclerosis. Right internal jugular Port-A-Cath terminates in the right atrium. Atherosclerotic nonaneurysmal thoracic aorta. Stable top-normal caliber main pulmonary artery (3.2 cm diameter). No central pulmonary emboli. Mediastinum/Nodes: No discrete thyroid nodules. Unremarkable esophagus. No pathologically enlarged axillary, mediastinal or hilar lymph nodes. Lungs/Pleura: No pneumothorax. Trace loculated anterior basilar right and apical right pleural effusion, stable. No left pleural effusion. Moderate centrilobular emphysema. Sharply marginated paramediastinal upper right lung consolidation with associated volume loss, distortion and bronchiectasis, compatible with radiation fibrosis. No acute consolidative airspace disease or lung masses. Basilar right lower lobe 3 mm solid pulmonary nodule (series 7/image 121) is stable. Medial left lower lobe ground-glass 1.0 cm pulmonary nodule (series 7/image 114) is stable using similar measurement technique. No new significant pulmonary nodules. Curvilinear parenchymal band in anteromedial left upper lobe is stable and compatible with post treatment change. Upper abdomen: No acute abnormality. Musculoskeletal: No aggressive appearing focal osseous lesions. Severe chronic T5 and T7 vertebral compression fractures with interval loss of vertebral body height at the T7 level since 12/16/2018 chest CT. Moderate thoracic spondylosis. IMPRESSION: 1. Paramediastinal upper right lung radiation fibrosis with no evidence of local tumor recurrence. 2. No findings suspicious for recurrent metastatic disease in the chest. Stable pulmonary nodules as detailed. Aortic Atherosclerosis (ICD10-I70.0) and Emphysema (ICD10-J43.9). Electronically Signed   By: JIlona SorrelM.D.   On: 05/12/2019 15:40    Medications: I have reviewed the patient's current  medications.   Assessment/Plan: 1. Adenocarcinoma/goblet cell carcinoid  of the appendix  CT scan of the abdomen/pelvis on 09/12/2017-fluid-filled hypervascular appendix with mild reactive enteritis within the ileum.  Status postlaparoscopic appendectomy 09/12/2017. Postoperative course complicated by an ileus requiring TPN.   Final pathology of the appendix-5 cm adenocarcinoma/goblet cell carcinoid. The tumor extended through the muscularis propria into subserosal soft tissue and mesoappendix and focally to the serosal surface. Lymphovascular space invasion identified. Perineural invasion identified. Associated acute appendicitis with perforation.  10/15/2017 CEA 1.  11/22/2017 chromogranin A-3  Status postlaparoscopic right hemicolectomy by Dr. Janace Litten 12/07/2017. No carcinomatosis or liver metastases were seen.   Final pathologyshowed metastatic adenocarcinoma/goblet cell carcinoid in 3 of 15 pericolic lymph nodes; maximum size of largest metastasis 0.2 cm; positive for extracapsular extension. Benign terminal ileum with fibrous serosal adhesions, negative for dysplasia or carcinoma; surgically absent appendix; appendectomy site with postsurgical changes and fibrous serosal adhesions, negative for carcinoma; proximal and distal mucosal margins negative for dysplasia or carcinoma; mesenteric resection margin negative for carcinoma.  Cycle 1 adjuvant Xeloda 01/07/2018  Cycle 2 adjuvant Xeloda 01/28/2018 2. Non-small cell lung cancer  PET [id number] hypermetabolic right upper lobe pulmonary nodules increased in size and metabolic activity. No mediastinal nodal metastasis. 4 mm right upper lobe nodule with metabolic activity, SUV 3.6. A third right upper lobe nodule measures 4 mm and has faint metabolic activity. No clear evidence of malignancy in the abdomen or pelvis. Mild activity along the ventral abdominal surgical scar. Scattered activity in small  bowel without focal lesion on CT.  CT biopsy right upper lobe nodule 11/02/2017 showed non-small cell carcinoma; malignant cells positive for TTF-1 and negative for p63. PD-L1 95%.  Chest CT 01/21/2018-new mediastinal adenopathy seen in the prevascular and right paratracheal stations. Largest lymph node is seen in the prevascular space measuring 2.0 cm, previously 6 mm. No hilar or axillary adenopathy. Right lung nodules are stable.  Biopsy of a right paratracheal lymph node on 02/04/2018-metastatic adenocarcinoma consistent with a lung primary  Chest radiation started 03/11/2018  Week 1 Taxol/carboplatin 03/13/2018  Week 2 Taxol/carboplatin 03/20/2018  Week 3 Taxol/carboplatin 03/27/2018  Week4Taxol/carboplatin 04/03/2018  Week 5 Taxol/carboplatin 04/10/2018  Week 6 Taxol/carboplatin 04/19/2018  Chest radiation completed 04/25/2018  CT chest 05/23/2018-interval decrease in size of mediastinal adenopathy. Near complete resolution of previously visualized right pleural effusion. The right lung nodules have decreased in size. Other nodules are stable.  Cycle 1 durvalumab 05/23/2018  Cycle 2durvalumab 06/06/2018  Cycle 3 durvalumab 06/20/2018  Cycle 4 durvalumab 07/04/2018  Cycle 5 durvalumab 07/19/2018  Cycle 6 durvalumab 08/01/2018  Cycle 7 durvalumab 08/15/2018  CT chest 09/03/2018-collapse/consolidation with bronchiectasis and surrounding groundglass involving primarilytheright upper lobe, findings most indicative of recent radiation therapy. Previously measured right upper lobe nodule obscured. New areas of nodularity in the right lower lobe, likely infectious or inflammatory.  Cycle 8durvalumab10/07/2018  Cycle 9 durvalumab 09/18/2018  Cycle 10 durvalumab 10/02/2018  Cycle 11 durvalumab 10/16/2018   Cycle 12 durvalumab 11/13/2018  Cycle 13 durvalumab 11/30/2018  Cycle 14 durvalumab1/15/2020  CT [facility] [facility] no mediastinal, axillary or hilar  adenopathy. Centrilobular emphysema. Right apical volume loss with bronchiectasis traversing the area of atelectatic lung. Elevation of the adjacent minor fissure due to volume loss. Subpleural left upper lobe opacity, nonspecific, measuring 8.7 mm. Compression fractures T5 and T7.  Cycle 15 durvalumab 12/25/2018  Cycle 16 durvalumab 01/29/2019  Cycle 17 durvalumab 02/12/2019  Cycle 18 durvalumab 02/26/2019  Cycle 19 durvalumab 03/12/2019  Cycle 20 durvalumab 03/26/2019  Cycle 21 durvalumab 04/16/2019  Cycle  22 durvalumab 04/30/2019  CT chest 05/12/2019- right lung radiation fibrosis, no evidence of local tumor recurrence, no evidence of metastatic disease in the chest, stable pulmonary nodules  Cycle 23 durvalumab 05/14/2019   3. COPD  4.VATS drainage of a loculated right pleural effusion 03/01/2018  5.Port-A-Cath placement7/03/2018  6. Severe mid back pain 10/30/2018-CT thoracic spine-T5 compression fracture new since 09/03/2018-appearance consistent with an osteoporotic fracture. Pain improved.Increased back pain January 2020. CT scan 12/16/2018-new compression fractures T5 and T7. T5 vertebral body somewhat sclerotic in appearance.MRI thoracic spine 01/08/2019-acute compression fractures involving the T5 and T7 vertebral bodies stable relative to recent CT with up to 50% height loss at T5 and 25% height loss at T7. No associated bony retropulsion. Benign/mechanical features by MRI with no appreciable underlying pathologic lesion. No other evidence for metastatic disease within the thoracic spine.     Disposition: Ms. Cowles is in clinical remission from lung cancer.  The restaging chest CT shows no evidence of disease progression.  She will complete a final treatment with durvalumab today.  She would like to keep the Port-A-Cath in place for now.  She will return for an office visit and Port-A-Cath flush in approximately 8 weeks.  Betsy Coder, MD  05/14/2019   11:53 AM

## 2019-05-14 NOTE — Patient Instructions (Signed)
[facility] Discharge Instructions for Patients Receiving Chemotherapy  Today you received the following chemotherapy agent: Imfinzi.  To help prevent nausea and vomiting after your treatment, we encourage you to take your nausea medication as directed.  If you develop nausea and vomiting that is not controlled by your nausea medication, call the clinic.   BELOW ARE SYMPTOMS THAT SHOULD BE REPORTED IMMEDIATELY:  *FEVER GREATER THAN 100.5 F  *CHILLS WITH OR WITHOUT FEVER  NAUSEA AND VOMITING THAT IS NOT CONTROLLED WITH YOUR NAUSEA MEDICATION  *UNUSUAL SHORTNESS OF BREATH  *UNUSUAL BRUISING OR BLEEDING  TENDERNESS IN MOUTH AND THROAT WITH OR WITHOUT PRESENCE OF ULCERS  *URINARY PROBLEMS  *BOWEL PROBLEMS  UNUSUAL RASH Items with * indicate a potential emergency and should be followed up as soon as possible.  Feel free to call the clinic should you have any questions or concerns. The clinic phone number is [phone].  Please show the [place] at check-in to the Emergency Department and triage nurse.

## 2019-05-16 ENCOUNTER — Telehealth: Payer: Self-pay | Admitting: Oncology

## 2019-05-16 NOTE — Telephone Encounter (Signed)
Scheduled per los. Mailed printout  °

## 2019-07-10 ENCOUNTER — Inpatient Hospital Stay: Payer: Medicare Other

## 2019-07-10 ENCOUNTER — Other Ambulatory Visit: Payer: Self-pay

## 2019-07-10 ENCOUNTER — Inpatient Hospital Stay: Payer: Medicare Other | Attending: Oncology | Admitting: Oncology

## 2019-07-10 VITALS — BP 143/79 | HR 105 | Temp 98.9°F | Resp 19 | Ht 62.0 in | Wt 124.8 lb

## 2019-07-10 DIAGNOSIS — C181 Malignant neoplasm of appendix: Secondary | ICD-10-CM | POA: Diagnosis not present

## 2019-07-10 DIAGNOSIS — G893 Neoplasm related pain (acute) (chronic): Secondary | ICD-10-CM | POA: Insufficient documentation

## 2019-07-10 DIAGNOSIS — Z95828 Presence of other vascular implants and grafts: Secondary | ICD-10-CM

## 2019-07-10 DIAGNOSIS — K529 Noninfective gastroenteritis and colitis, unspecified: Secondary | ICD-10-CM | POA: Diagnosis not present

## 2019-07-10 DIAGNOSIS — Z9221 Personal history of antineoplastic chemotherapy: Secondary | ICD-10-CM | POA: Insufficient documentation

## 2019-07-10 DIAGNOSIS — C772 Secondary and unspecified malignant neoplasm of intra-abdominal lymph nodes: Secondary | ICD-10-CM | POA: Insufficient documentation

## 2019-07-10 DIAGNOSIS — Z923 Personal history of irradiation: Secondary | ICD-10-CM | POA: Diagnosis not present

## 2019-07-10 DIAGNOSIS — K9189 Other postprocedural complications and disorders of digestive system: Secondary | ICD-10-CM | POA: Insufficient documentation

## 2019-07-10 DIAGNOSIS — K3532 Acute appendicitis with perforation and localized peritonitis, without abscess: Secondary | ICD-10-CM | POA: Diagnosis not present

## 2019-07-10 DIAGNOSIS — C3411 Malignant neoplasm of upper lobe, right bronchus or lung: Secondary | ICD-10-CM

## 2019-07-10 MED ORDER — SODIUM CHLORIDE 0.9% FLUSH
10.0000 mL | Freq: Once | INTRAVENOUS | Status: AC
  Administered 2019-07-10: 10 mL
  Filled 2019-07-10: qty 10

## 2019-07-10 MED ORDER — HEPARIN SOD (PORK) LOCK FLUSH 100 UNIT/ML IV SOLN
500.0000 [IU] | Freq: Once | INTRAVENOUS | Status: AC
  Administered 2019-07-10: 500 [IU]
  Filled 2019-07-10: qty 5

## 2019-07-10 NOTE — Progress Notes (Signed)
[place] OFFICE PROGRESS NOTE   Diagnosis: Non-small cell lung cancer  INTERVAL HISTORY:    Melissa Golden returns as scheduled.  She feels well.  She no longer has significant back pain.  She is not taking pain medication.  She has a chronic cough and congestion.  No dyspnea.  Objective:  Vital signs in last 24 hours:  Blood pressure (!) 143/79, pulse (!) 105, temperature 98.9 F (37.2 C), temperature source Oral, resp. rate 19, height '5\' 2"'$  (1.575 m), weight 124 lb 12.8 oz (56.6 kg), SpO2 100 %.    HEENT: Neck without mass Lymphatics: No cervical or supraclavicular nodes, soft mobile 1/2-1 cm bilateral axillary nodes versus prominent fat pads GI: No hepatosplenomegaly Vascular: No leg edema   Portacath/PICC-without erythema  Lab Results:  Lab Results  Component Value Date   WBC 5.5 05/14/2019   HGB 12.9 05/14/2019   HCT 39.5 05/14/2019   MCV 96.3 05/14/2019   PLT 158 05/14/2019   NEUTROABS 4.3 05/14/2019    CMP  Lab Results  Component Value Date   NA 142 05/14/2019   K 4.0 05/14/2019   CL 104 05/14/2019   CO2 28 05/14/2019   GLUCOSE 95 05/14/2019   BUN 6 (L) 05/14/2019   CREATININE 0.60 05/14/2019   CALCIUM 8.8 (L) 05/14/2019   PROT 7.0 05/14/2019   ALBUMIN 3.7 05/14/2019   AST 17 05/14/2019   ALT 11 05/14/2019   ALKPHOS 84 05/14/2019   BILITOT 0.4 05/14/2019   GFRNONAA >60 05/14/2019   GFRAA >60 05/14/2019    No results found for: CEA1  Lab Results  Component Value Date   INR 0.89 05/31/2018    Imaging:  No results found.  Medications: I have reviewed the patient's current medications.   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  Status postlaparoscopic appendectomy 09/12/2017. Postoperative course complicated by an ileus requiring TPN.   Final pathology of  the appendix-5 cm adenocarcinoma/goblet cell carcinoid. The tumor extended through the muscularis propria into subserosal soft tissue and mesoappendix and focally to the serosal surface. Lymphovascular space invasion identified. Perineural invasion identified. Associated acute appendicitis with perforation.  10/15/2017 CEA 1.  11/22/2017 chromogranin A-3  Status postlaparoscopic right hemicolectomy by Dr. Janace Litten 12/07/2017. No carcinomatosis or liver metastases were seen.   Final pathologyshowed metastatic adenocarcinoma/goblet cell carcinoid in 3 of 15 pericolic lymph nodes; maximum size of largest metastasis 0.2 cm; positive for extracapsular extension. Benign terminal ileum with fibrous serosal adhesions, negative for dysplasia or carcinoma; surgically absent appendix; appendectomy site with postsurgical changes and fibrous serosal adhesions, negative for carcinoma; proximal and distal mucosal margins negative for dysplasia or carcinoma; mesenteric resection margin negative for carcinoma.  Cycle 1 adjuvant Xeloda 01/07/2018  Cycle 2 adjuvant Xeloda 01/28/2018 2. Non-small cell lung cancer  PET [id number] hypermetabolic right upper lobe pulmonary nodules increased in size and metabolic activity. No mediastinal nodal metastasis. 4 mm right upper lobe nodule with metabolic activity, SUV 3.6. A third right upper lobe nodule measures 4 mm and has faint metabolic activity. No clear evidence of malignancy in the abdomen or pelvis. Mild activity along the ventral abdominal surgical scar. Scattered activity in small bowel without focal lesion on CT.  CT biopsy right upper lobe nodule 11/02/2017 showed non-small cell carcinoma; malignant cells positive for TTF-1 and negative for p63. PD-L1 95%.  Chest CT 01/21/2018-new mediastinal adenopathy seen in the  prevascular and right paratracheal stations. Largest lymph node is seen in the prevascular space measuring 2.0 cm,  previously 6 mm. No hilar or axillary adenopathy. Right lung nodules are stable.  Biopsy of a right paratracheal lymph node on 02/04/2018-metastatic adenocarcinoma consistent with a lung primary  Chest radiation started 03/11/2018  Week 1 Taxol/carboplatin 03/13/2018  Week 2 Taxol/carboplatin 03/20/2018  Week 3 Taxol/carboplatin 03/27/2018  Week4Taxol/carboplatin 04/03/2018  Week 5 Taxol/carboplatin 04/10/2018  Week 6 Taxol/carboplatin 04/19/2018  Chest radiation completed 04/25/2018  CT chest 05/23/2018-interval decrease in size of mediastinal adenopathy. Near complete resolution of previously visualized right pleural effusion. The right lung nodules have decreased in size. Other nodules are stable.  Cycle 1 durvalumab 05/23/2018  Cycle 2durvalumab 06/06/2018  Cycle 3 durvalumab 06/20/2018  Cycle 4 durvalumab 07/04/2018  Cycle 5 durvalumab 07/19/2018  Cycle 6 durvalumab 08/01/2018  Cycle 7 durvalumab 08/15/2018  CT chest 09/03/2018-collapse/consolidation with bronchiectasis and surrounding groundglass involving primarilytheright upper lobe, findings most indicative of recent radiation therapy. Previously measured right upper lobe nodule obscured. New areas of nodularity in the right lower lobe, likely infectious or inflammatory.  Cycle 8durvalumab10/07/2018  Cycle 9 durvalumab 09/18/2018  Cycle 10 durvalumab 10/02/2018  Cycle 11 durvalumab 10/16/2018   Cycle 12 durvalumab 11/13/2018  Cycle 13 durvalumab 11/30/2018  Cycle 14 durvalumab1/15/2020  [facility] no mediastinal, axillary or hilar adenopathy. Centrilobular emphysema. Right apical volume loss with bronchiectasis traversing the area of atelectatic lung. Elevation of the adjacent minor fissure due to volume loss. Subpleural left upper lobe opacity, nonspecific, measuring 8.7 mm. Compression fractures T5 and T7.  Cycle 15 durvalumab 12/25/2018  Cycle 16 durvalumab 01/29/2019  Cycle 17  durvalumab 02/12/2019  Cycle 18 durvalumab 02/26/2019  Cycle 19 durvalumab 03/12/2019  Cycle 20 durvalumab 03/26/2019  Cycle 21 durvalumab 04/16/2019  Cycle 22 durvalumab 04/30/2019  CT chest 05/12/2019- right lung radiation fibrosis, no evidence of local tumor recurrence, no evidence of metastatic disease in the chest, stable pulmonary nodules  Cycle 23 durvalumab 05/14/2019   3. COPD  4.VATS drainage of a loculated right pleural effusion 03/01/2018  5.Port-A-Cath placement7/03/2018  6. Severe mid back pain 10/30/2018-CT thoracic spine-T5 compression fracture new since 09/03/2018-appearance consistent with an osteoporotic fracture. Pain improved.Increased back pain January 2020. CT scan 12/16/2018-new compression fractures T5 and T7. T5 vertebral body somewhat sclerotic in appearance.MRI thoracic spine 01/08/2019-acute compression fractures involving the T5 and T7 vertebral bodies stable relative to recent CT with up to 50% height loss at T5 and 25% height loss at T7. No associated bony retropulsion. Benign/mechanical features by MRI with no appreciable underlying pathologic lesion. No other evidence for metastatic disease within the thoracic spine.      Disposition: Melissa Golden is in remission from non-small cell lung cancer.  She would like to keep the Port-A-Cath in place.  She will return for an office visit and Port-A-Cath flush in 8 weeks.  We will plan for a restaging CT evaluation in approximately 4 months.  She requested a referral to pulmonary medicine for management of COPD with chronic congestion/cough.  Betsy Coder, MD  07/10/2019  3:49 PM

## 2019-07-30 DIAGNOSIS — R509 Fever, unspecified: Secondary | ICD-10-CM | POA: Diagnosis not present

## 2019-07-30 DIAGNOSIS — J441 Chronic obstructive pulmonary disease with (acute) exacerbation: Secondary | ICD-10-CM | POA: Diagnosis not present

## 2019-08-13 ENCOUNTER — Ambulatory Visit (INDEPENDENT_AMBULATORY_CARE_PROVIDER_SITE_OTHER): Payer: Medicare Other | Admitting: Pulmonary Disease

## 2019-08-13 ENCOUNTER — Other Ambulatory Visit: Payer: Self-pay

## 2019-08-13 ENCOUNTER — Encounter: Payer: Self-pay | Admitting: Pulmonary Disease

## 2019-08-13 ENCOUNTER — Telehealth: Payer: Self-pay | Admitting: Pulmonary Disease

## 2019-08-13 VITALS — BP 124/82 | HR 111 | Temp 97.2°F | Ht 64.0 in | Wt 123.6 lb

## 2019-08-13 DIAGNOSIS — Z87891 Personal history of nicotine dependence: Secondary | ICD-10-CM

## 2019-08-13 DIAGNOSIS — Z720 Tobacco use: Secondary | ICD-10-CM | POA: Diagnosis not present

## 2019-08-13 DIAGNOSIS — Z85118 Personal history of other malignant neoplasm of bronchus and lung: Secondary | ICD-10-CM | POA: Diagnosis not present

## 2019-08-13 DIAGNOSIS — J9611 Chronic respiratory failure with hypoxia: Secondary | ICD-10-CM

## 2019-08-13 DIAGNOSIS — J449 Chronic obstructive pulmonary disease, unspecified: Secondary | ICD-10-CM

## 2019-08-13 DIAGNOSIS — J441 Chronic obstructive pulmonary disease with (acute) exacerbation: Secondary | ICD-10-CM

## 2019-08-13 MED ORDER — AZITHROMYCIN 250 MG PO TABS: 250.0000 mg | ORAL_TABLET | Freq: Every day | ORAL | 0 refills | Status: DC

## 2019-08-13 MED ORDER — PREDNISONE 10 MG PO TABS: ORAL_TABLET | ORAL | 0 refills | Status: DC

## 2019-08-13 MED ORDER — FLUTICASONE-SALMETEROL 232-14 MCG/ACT IN AEPB: 1.0000 | INHALATION_SPRAY | Freq: Two times a day (BID) | RESPIRATORY_TRACT | 6 refills | Status: DC

## 2019-08-13 MED ORDER — SPIRIVA RESPIMAT 2.5 MCG/ACT IN AERS: 2.0000 | INHALATION_SPRAY | Freq: Every day | RESPIRATORY_TRACT | 0 refills | Status: DC

## 2019-08-13 MED ORDER — BUDESONIDE-FORMOTEROL FUMARATE 160-4.5 MCG/ACT IN AERO: 2.0000 | INHALATION_SPRAY | Freq: Two times a day (BID) | RESPIRATORY_TRACT | 0 refills | Status: DC

## 2019-08-13 MED ORDER — SPIRIVA RESPIMAT 2.5 MCG/ACT IN AERS: 2.0000 | INHALATION_SPRAY | Freq: Once | RESPIRATORY_TRACT | 6 refills | Status: DC

## 2019-08-13 MED ORDER — AZITHROMYCIN 250 MG PO TABS: ORAL_TABLET | ORAL | 0 refills | Status: DC

## 2019-08-13 NOTE — Patient Instructions (Addendum)
Thank you for visiting Dr. Valeta Harms at [facility]. Today we recommend the following:  Meds ordered this encounter  Medications  . predniSONE (DELTASONE) 10 MG tablet    Sig: Take 4 tabs by mouth once daily x4 days, then 3 tabs x4 days, 2 tabs x4 days, 1 tab x4 days and stop.    Dispense:  40 tablet    Refill:  0  . azithromycin (ZITHROMAX) 250 MG tablet    Sig: Take 1 tablet (250 mg total) by mouth daily.    Dispense:  5 tablet    Refill:  0  . Fluticasone-Salmeterol 232-14 MCG/ACT AEPB    Sig: Inhale 1 puff into the lungs 2 (two) times daily.    Dispense:  1 each    Refill:  6  . Tiotropium Bromide Monohydrate (SPIRIVA RESPIMAT) 2.5 MCG/ACT AERS    Sig: Inhale 2 puffs into the lungs once for 1 dose.    Dispense:  4 g    Refill:  6   Start OTC Vitamin D2 Supplement at least 2000 IU daily   Return in about 6 weeks (around 09/24/2019).    Please do your part to reduce the spread of COVID-19.

## 2019-08-13 NOTE — Telephone Encounter (Signed)
Call returned to [facility], she is asking for a diagnosis for the azithromycin that was sent in for patient. I made her it was for a COPD exacerbation. She inquired as to whether we meant to send in a Z-pack.   Per BI, yes I wanted a Zpack sent in.   Made her aware a Z-pack is what we wanted. She asked that I send in electronically and she would cancel the previous order. Nothing further needed at this time. Order sent.

## 2019-08-13 NOTE — Progress Notes (Signed)
Synopsis: Referred in September 2020 for COPD management by Ladell Pier, MD  Subjective:   PATIENT ID: Melissa Golden GENDER: female DOB: 10/10/1946, MRN: [id number]  Chief Complaint  Patient presents with  . Consult    Consult for COPD managment. Currently in remission from Lung CA. Currenlty 2L pulsed. DME [employer]. She reports using albuterol and pulmicort HFA.     73 year old female past medical history of non-small cell lung cancer.  Diagnosed in 2018 with 3 hyper medic right upper lobe pulmonary nodules.  Had a CT-guided biopsy of the upper lobe nodule in December 2018 consistent with non-small cell lung cancer, TTF-1 positive p63 negative, PD-L1 95%.  In February 2019 had new mediastinal adenopathy within the prevascular and right paratracheal lymph node.  Biopsy of the paratracheal lymph node revealed metastatic lung primary.  Patient underwent 6 weeks of Taxol carboplatinum followed by durvalumab.  Repeat CT chest in June 2020 with evidence of right lung radiation and stable nodules.  Patient also has a history of adenocarcinoma/goblet cell carcinoid of the appendix.  This was in 2018 as well.  Patient underwent laparoscopic right hemicolect[facility] at [facility].  Patient underwent adjuvant Xeloda.  Patient is followed here in [facility] by Dr. Benay Spice her oncologist.  Patient was referred to see me for COPD management.  And complaints of chronic congestion.  OV 08/13/2019: First diagnosed with COPD back in 2018. Currently on pulmicort + albuterol. She is using her albuterol 3 times per day. She has a travel neb machine but it doesn't work. No fevers, chills. She has a chronic cough. Occassional brings up sputum. She feels like she wheezes all of the time. She lives in [place]. Lives with her husband. Former smoker, quit in 2017, smoked for 50+ years, 1.5-2ppd.  Patient is on 2 L nasal cannula pulse POC.   Past Medical History:  Diagnosis Date  . Asthma   . Cancer ([place]) 2017   colon    chemo tx  . COPD (chronic obstructive pulmonary disease) ([place])    wears 3L home O2  . Ileus following gastrointestinal surgery ([place]) 09/12/2017   required TPN support  . Pleural effusion, right   . Shortness of breath    with exertion     Family History  Problem Relation Age of Onset  . Cancer Mother   . Heart attack Father   . Cancer - Colon Sister      Past Surgical History:  Procedure Laterality Date  . ABDOMINAL HYSTERECTOMY    . CATARACT EXTRACTION W/ INTRAOCULAR LENS IMPLANT     Right  . CATARACT EXTRACTION W/PHACO  01/02/2013   Procedure: CATARACT EXTRACTION PHACO AND INTRAOCULAR LENS PLACEMENT (IOC);  Surgeon: Tonny Branch, MD;  Location: [facility];  Service: Ophthalmology;  Laterality: Left;  CDE: 16.19  . EYE SURGERY    . IR IMAGING GUIDED PORT INSERTION  05/31/2018  . IR THORACENTESIS ASP PLEURAL SPACE W/IMG GUIDE  02/19/2018  . LAPAROSCOPIC APPENDECTOMY  09/12/2017  . laparoscopic hemicolectomy Right 12/07/2017   DR. Stitzenberg and [place]    . LYMPH NODE BIOPSY N/A 02/04/2018   Procedure: LYMPH NODE BIOPSY;  Surgeon: Gaye Pollack, MD;  Location: [place];  Service: Thoracic;  Laterality: N/A;  . MEDIASTINOSCOPY N/A 02/04/2018   Procedure: MEDIASTINOSCOPY;  Surgeon: Gaye Pollack, MD;  Location: [place] OR;  Service: Thoracic;  Laterality: N/A;  . PLEURAL EFFUSION DRAINAGE Right 03/01/2018   Procedure: DRAINAGE OF PLEURAL EFFUSION;  Surgeon:  Gaye Pollack, MD;  Location: [facility];  Service: Thoracic;  Laterality: Right;  Marland Kitchen VIDEO ASSISTED THORACOSCOPY Right 03/01/2018   Procedure: VIDEO ASSISTED THORACOSCOPY;  Surgeon: Gaye Pollack, MD;  Location: [facility] OR;  Service: Thoracic;  Laterality: Right;    Social History   Socioeconomic History  . Marital status: Married    Spouse name: Not on file  . Number of children: Not on file  . Years of education: Not on file  . Highest education level: Not on file  Occupational History  . Occupation: retired  Photographer  . Financial resource strain: Not on file  . Food insecurity    Worry: Not on file    Inability: Not on file  . Transportation needs    Medical: Not on file    Non-medical: Not on file  Tobacco Use  . Smoking status: Former Smoker    Packs/day: 1.50    Years: 68.00    Pack years: 102.00    Types: Cigarettes    Quit date: 10/2016    Years since quitting: 2.7  . Smokeless tobacco: Never Used  Substance and Sexual Activity  . Alcohol use: Not Currently    Alcohol/week: 6.0 standard drinks    Types: 6 Cans of beer per week  . Drug use: No  . Sexual activity: Not on file  Lifestyle  . Physical activity    Days per week: Not on file    Minutes per session: Not on file  . Stress: Not on file  Relationships  . Social Herbalist on phone: Not on file    Gets together: Not on file    Attends religious service: Not on file    Active member of club or organization: Not on file    Attends meetings of clubs or organizations: Not on file    Relationship status: Not on file  . Intimate partner violence    Fear of current or ex partner: Not on file    Emotionally abused: Not on file    Physically abused: Not on file    Forced sexual activity: Not on file  Other Topics Concern  . Not on file  Social History Narrative  . Not on file     No Known Allergies   Outpatient Medications Prior to Visit  Medication Sig Dispense Refill  . acetaminophen (TYLENOL) 500 MG tablet Take 2 tablets (1,000 mg total) by mouth every 6 (six) hours. 30 tablet 0  . albuterol (PROVENTIL HFA;VENTOLIN HFA) 108 (90 BASE) MCG/ACT inhaler Inhale 2 puffs into the lungs 3 (three) times daily. Shortness of Breath     . budesonide (PULMICORT) 180 MCG/ACT inhaler Inhale 2 puffs into the lungs 2 (two) times daily.    Marland Kitchen ibuprofen (ADVIL,MOTRIN) 200 MG tablet Take 400 mg by mouth every 6 (six) hours as needed for moderate pain. Pain     . lidocaine-prilocaine (EMLA) cream Apply to portacath 30 min to 1  hour prior to flush or infusions 30 g 1  . Multiple Vitamin (MULTIVITAMIN WITH MINERALS) TABS Take 1 tablet by mouth daily.    Marland Kitchen oxyCODONE-acetaminophen (PERCOCET/ROXICET) 5-325 MG tablet Take 1 tablet by mouth every 4 (four) hours as needed for severe pain. DO NOT DRIVE 42 tablet 0  . prochlorperazine (COMPAZINE) 5 MG tablet Take 1 tablet (5 mg total) by mouth every 6 (six) hours as needed for nausea or vomiting. 30 tablet 0   No facility-administered medications prior to visit.  Review of Systems  Constitutional: Positive for weight loss. Negative for chills, fever and malaise/fatigue.  HENT: Negative for hearing loss, sore throat and tinnitus.   Eyes: Negative for blurred vision and double vision.  Respiratory: Positive for cough, shortness of breath and wheezing. Negative for hemoptysis, sputum production and stridor.   Cardiovascular: Negative for chest pain, palpitations, orthopnea, leg swelling and PND.  Gastrointestinal: Negative for abdominal pain, constipation, diarrhea, heartburn, nausea and vomiting.  Genitourinary: Negative for dysuria, hematuria and urgency.  Musculoskeletal: Negative for joint pain and myalgias.  Skin: Negative for itching and rash.  Neurological: Negative for dizziness, tingling, weakness and headaches.  Endo/Heme/Allergies: Negative for environmental allergies. Does not bruise/bleed easily.  Psychiatric/Behavioral: Negative for depression. The patient is not nervous/anxious and does not have insomnia.   All other systems reviewed and are negative.    Objective:  Physical Exam Vitals signs reviewed.  Constitutional:      General: She is not in acute distress.    Appearance: She is well-developed.  HENT:     Head: Normocephalic and atraumatic.     Mouth/Throat:     Pharynx: No oropharyngeal exudate.  Eyes:     Conjunctiva/sclera: Conjunctivae normal.     Pupils: Pupils are equal, round, and reactive to light.  Neck:     Vascular: No JVD.      Trachea: No tracheal deviation.     Comments: Loss of supraclavicular fat Cardiovascular:     Rate and Rhythm: Normal rate and regular rhythm.     Heart sounds: S1 normal and S2 normal.     Comments: Distant heart tones Pulmonary:     Effort: No tachypnea or accessory muscle usage.     Breath sounds: No stridor. Decreased breath sounds (throughout all lung fields) and wheezing present. No rhonchi or rales.  Abdominal:     General: Bowel sounds are normal. There is no distension.     Palpations: Abdomen is soft.     Tenderness: There is no abdominal tenderness.  Musculoskeletal:        General: Deformity (muscle wasting ) present.  Skin:    General: Skin is warm and dry.     Capillary Refill: Capillary refill takes less than 2 seconds.     Findings: No rash.  Neurological:     Mental Status: She is alert and oriented to person, place, and time.  Psychiatric:        Behavior: Behavior normal.      Vitals:   08/13/19 1458  BP: 124/82  Pulse: (!) 111  Temp: (!) 97.2 F (36.2 C)  TempSrc: Temporal  SpO2: 99%  Weight: 123 lb 9.6 oz (56.1 kg)  Height: '5\' 4"'$  (1.626 m)   99% on RA BMI Readings from Last 3 Encounters:  08/13/19 21.22 kg/m  07/10/19 22.83 kg/m  05/14/19 22.64 kg/m   Wt Readings from Last 3 Encounters:  08/13/19 123 lb 9.6 oz (56.1 kg)  07/10/19 124 lb 12.8 oz (56.6 kg)  05/14/19 123 lb 12.8 oz (56.2 kg)     CBC    Component Value Date/Time   WBC 5.5 05/14/2019 1102   WBC 4.3 05/31/2018 0903   RBC 4.10 05/14/2019 1102   HGB 12.9 05/14/2019 1102   HCT 39.5 05/14/2019 1102   PLT 158 05/14/2019 1102   MCV 96.3 05/14/2019 1102   MCH 31.5 05/14/2019 1102   MCHC 32.7 05/14/2019 1102   RDW 12.1 05/14/2019 1102   LYMPHSABS 0.6 (L) 05/14/2019 1102  MONOABS 0.5 05/14/2019 1102   EOSABS 0.0 05/14/2019 1102   BASOSABS 0.0 05/14/2019 1102    Chest Imaging: June 2020 CT chest: Paramediastinal right upper lobe lung radiation no evidence of recurrent  tumor.  No evidence of metastatic disease.  Moderate amount of centrilobular emphysema. The patient's images have been independently reviewed by me.    Pulmonary Functions Testing Results: PFT Results Latest Ref Rng & Units 12/05/2016  FVC-Pre L 1.32  FVC-Predicted Pre % 46  FVC-Post L 1.74  FVC-Predicted Post % 61  Pre FEV1/FVC % % 45  Post FEV1/FCV % % 47  FEV1-Pre L 0.60  FEV1-Predicted Pre % 27  FEV1-Post L 0.81  DLCO UNC% % 37  DLCO COR %Predicted % 51  TLC L 5.50  TLC % Predicted % 113  RV % Predicted % 186    FeNO: None   Pathology:  CT Guided BX 2018: Lung cancer: NSCLC, +TTF1, +PDL1  Mediastinoscopy 02/04/2018: Metastatic lung cancer  Echocardiogram: None   Heart Catheterization: None     Assessment & Plan:     ICD-10-CM   1. Chronic obstructive pulmonary disease, unspecified COPD type ([place])  J44.9   2. History of lung cancer  Z85.118   3. Former smoker  Z87.891   4. Tobacco abuse  Z72.0   5. Chronic hypoxemic respiratory failure (H[facility])  J96.11   6. COPD with acute exacerbation [facility])  J44.1     Discussion:   73 year old female severe COPD PFTs completed in 2018.  Chronic hypoxemic respiratory failure oxygen dependent 2 L POC.  She has been managed on Pulmicort plus albuterol for a long time.  Plan: Triple therapy inhaler regimen to start today. We will give her samples of Symbicort 160 to be used 2 puffs, twice daily with spacer Per insurance the Symbicort will not be covered therefore we will give her samples and prescribe fluticasone/salmeterol.  If she does okay with this we can leave her on the fluticasone/salmeterol however we will try to obtain prior Auth for Symbicort 160. Also start Spiriva 2.5, 2 puffs once daily. Ensure that she is taking a vitamin D supplement As for her ongoing current exacerbation we will give her a prednisone taper and 5 days of azithromycin.  Return to clinic in 6 weeks.  Greater than 50% of this patient's 60-minute office  visit was spent face-to-face discussing above recommendations and treatment plan as well as review of the patient's prior past medical history, surgical history, prior mediastinoscopy results, and her prior cancer treatment regimen.  We also reviewed the patient's pulmonary function test today in the office as well as most recent CT imaging.    Current Outpatient Medications:  .  acetaminophen (TYLENOL) 500 MG tablet, Take 2 tablets (1,000 mg total) by mouth every 6 (six) hours., Disp: 30 tablet, Rfl: 0 .  albuterol (PROVENTIL HFA;VENTOLIN HFA) 108 (90 BASE) MCG/ACT inhaler, Inhale 2 puffs into the lungs 3 (three) times daily. Shortness of Breath , Disp: , Rfl:  .  budesonide (PULMICORT) 180 MCG/ACT inhaler, Inhale 2 puffs into the lungs 2 (two) times daily., Disp: , Rfl:  .  ibuprofen (ADVIL,MOTRIN) 200 MG tablet, Take 400 mg by mouth every 6 (six) hours as needed for moderate pain. Pain , Disp: , Rfl:  .  lidocaine-prilocaine (EMLA) cream, Apply to portacath 30 min to 1 hour prior to flush or infusions, Disp: 30 g, Rfl: 1 .  Multiple Vitamin (MULTIVITAMIN WITH MINERALS) TABS, Take 1 tablet by mouth daily., Disp: ,  Rfl:  .  oxyCODONE-acetaminophen (PERCOCET/ROXICET) 5-325 MG tablet, Take 1 tablet by mouth every 4 (four) hours as needed for severe pain. DO NOT DRIVE, Disp: 42 tablet, Rfl: 0 .  prochlorperazine (COMPAZINE) 5 MG tablet, Take 1 tablet (5 mg total) by mouth every 6 (six) hours as needed for nausea or vomiting., Disp: 30 tablet, Rfl: 0   Garner Nash, DO [facility] 08/13/2019 3:07 PM

## 2019-08-21 ENCOUNTER — Inpatient Hospital Stay: Payer: Medicare Other | Attending: Oncology | Admitting: Nurse Practitioner

## 2019-08-21 ENCOUNTER — Inpatient Hospital Stay: Payer: Medicare Other

## 2019-08-21 ENCOUNTER — Other Ambulatory Visit: Payer: Self-pay

## 2019-08-21 ENCOUNTER — Encounter: Payer: Self-pay | Admitting: Nurse Practitioner

## 2019-08-21 VITALS — BP 146/80 | HR 109 | Temp 98.3°F | Resp 18 | Ht 64.0 in | Wt 125.3 lb

## 2019-08-21 DIAGNOSIS — Z9221 Personal history of antineoplastic chemotherapy: Secondary | ICD-10-CM | POA: Insufficient documentation

## 2019-08-21 DIAGNOSIS — Z923 Personal history of irradiation: Secondary | ICD-10-CM | POA: Insufficient documentation

## 2019-08-21 DIAGNOSIS — C3411 Malignant neoplasm of upper lobe, right bronchus or lung: Secondary | ICD-10-CM | POA: Diagnosis not present

## 2019-08-21 DIAGNOSIS — J449 Chronic obstructive pulmonary disease, unspecified: Secondary | ICD-10-CM | POA: Diagnosis not present

## 2019-08-21 DIAGNOSIS — Z452 Encounter for adjustment and management of vascular access device: Secondary | ICD-10-CM | POA: Insufficient documentation

## 2019-08-21 DIAGNOSIS — Z95828 Presence of other vascular implants and grafts: Secondary | ICD-10-CM

## 2019-08-21 MED ORDER — SODIUM CHLORIDE 0.9% FLUSH
10.0000 mL | Freq: Once | INTRAVENOUS | Status: AC
  Administered 2019-08-21: 10 mL
  Filled 2019-08-21: qty 10

## 2019-08-21 MED ORDER — HEPARIN SOD (PORK) LOCK FLUSH 100 UNIT/ML IV SOLN
500.0000 [IU] | Freq: Once | INTRAVENOUS | Status: AC
  Administered 2019-08-21: 14:00:00 500 [IU]
  Filled 2019-08-21: qty 5

## 2019-08-21 NOTE — Progress Notes (Signed)
[place] OFFICE PROGRESS NOTE   Diagnosis: Non-small cell lung cancer  INTERVAL HISTORY:   Melissa Golden returns as scheduled.  She reports being diagnosed with "severe COPD".  She recently completed a Z-Pak and is currently on prednisone.  Stable dyspnea and cough.  No back pain.  No bowel or bladder problems.  She has a good appetite.  Objective:  Vital signs in last 24 hours:  Blood pressure (!) 146/80, pulse (!) 109, temperature 98.3 F (36.8 C), temperature source Oral, resp. rate 18, height _0  (1.626 m), weight 125 lb 4.8 oz (56.8 kg), SpO2 95 %.    HEENT: Neck without mass. Lymphatics: No palpable cervical, supraclavicular or axillary lymph nodes. Resp: Bilateral rhonchi.  No respiratory distress. Cardio: Regular rate and rhythm. GI: Abdomen soft and nontender.  No hepatomegaly. Vascular: No leg edema.  Port-A-Cath without erythema.  Lab Results:  Lab Results  Component Value Date   WBC 5.5 05/14/2019   HGB 12.9 05/14/2019   HCT 39.5 05/14/2019   MCV 96.3 05/14/2019   PLT 158 05/14/2019   NEUTROABS 4.3 05/14/2019    Imaging:  No results found.  Medications: I have reviewed the patient's current medications.  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  Status postlaparoscopic appendectomy 09/12/2017. Postoperative course complicated by an ileus requiring TPN.   Final pathology of the appendix-5 cm adenocarcinoma/goblet cell carcinoid. The tumor extended through the muscularis propria into subserosal soft tissue and mesoappendix and focally to the serosal surface. Lymphovascular space invasion identified. Perineural invasion identified. Associated acute appendicitis with perforation.  10/15/2017 CEA 1.  11/22/2017 chromogranin A-3  Status postlaparoscopic right  hemicolectomy by Dr. Janace Litten 12/07/2017. No carcinomatosis or liver metastases were seen.   Final pathologyshowed metastatic adenocarcinoma/goblet cell carcinoid in 3 of 15 pericolic lymph nodes; maximum size of largest metastasis 0.2 cm; positive for extracapsular extension. Benign terminal ileum with fibrous serosal adhesions, negative for dysplasia or carcinoma; surgically absent appendix; appendectomy site with postsurgical changes and fibrous serosal adhesions, negative for carcinoma; proximal and distal mucosal margins negative for dysplasia or carcinoma; mesenteric resection margin negative for carcinoma.  Cycle 1 adjuvant Xeloda 01/07/2018  Cycle 2 adjuvant Xeloda 01/28/2018 2. Non-small cell lung cancer  PET [id number] hypermetabolic right upper lobe pulmonary nodules increased in size and metabolic activity. No mediastinal nodal metastasis. 4 mm right upper lobe nodule with metabolic activity, SUV 3.6. A third right upper lobe nodule measures 4 mm and has faint metabolic activity. No clear evidence of malignancy in the abdomen or pelvis. Mild activity along the ventral abdominal surgical scar. Scattered activity in small bowel without focal lesion on CT.  CT biopsy right upper lobe nodule 11/02/2017 showed non-small cell carcinoma; malignant cells positive for TTF-1 and negative for p63. PD-L1 95%.  Chest CT 01/21/2018-new mediastinal adenopathy seen in the prevascular and right paratracheal stations. Largest lymph node is seen in the prevascular space measuring 2.0 cm, previously 6 mm. No hilar or axillary adenopathy. Right lung nodules are stable.  Biopsy of a right paratracheal lymph node on 02/04/2018-metastatic adenocarcinoma consistent with a lung primary  Chest radiation started 03/11/2018  Week 1 Taxol/carboplatin 03/13/2018  Week 2 Taxol/carboplatin 03/20/2018  Week 3 Taxol/carboplatin 03/27/2018  Week4Taxol/carboplatin 04/03/2018  Week 5  Taxol/carboplatin 04/10/2018  Week 6 Taxol/carboplatin 04/19/2018  Chest radiation completed 04/25/2018  CT chest 05/23/2018-interval decrease in size of mediastinal adenopathy.  Near complete resolution of previously visualized right pleural effusion. The right lung nodules have decreased in size. Other nodules are stable.  Cycle 1 durvalumab 05/23/2018  Cycle 2durvalumab 06/06/2018  Cycle 3 durvalumab 06/20/2018  Cycle 4 durvalumab 07/04/2018  Cycle 5 durvalumab 07/19/2018  Cycle 6 durvalumab 08/01/2018  Cycle 7 durvalumab 08/15/2018  CT chest 09/03/2018-collapse/consolidation with bronchiectasis and surrounding groundglass involving primarilytheright upper lobe, findings most indicative of recent radiation therapy. Previously measured right upper lobe nodule obscured. New areas of nodularity in the right lower lobe, likely infectious or inflammatory.  Cycle 8durvalumab10/07/2018  Cycle 9 durvalumab 09/18/2018  Cycle 10 durvalumab 10/02/2018  Cycle 11 durvalumab 10/16/2018   Cycle 12 durvalumab 11/13/2018  Cycle 13 durvalumab 11/30/2018  Cycle 14 durvalumab1/15/2020  [facility]no mediastinal, axillary or hilar adenopathy. Centrilobular emphysema. Right apical volume loss with bronchiectasis traversing the area of atelectatic lung. Elevation of the adjacent minor fissure due to volume loss. Subpleural left upper lobe opacity, nonspecific, measuring 8.7 mm. Compression fractures T5 and T7.  Cycle 15 durvalumab 12/25/2018  Cycle 16 durvalumab 01/29/2019  Cycle 17 durvalumab 02/12/2019  Cycle 18 durvalumab 02/26/2019  Cycle 19 durvalumab 03/12/2019  Cycle 20 durvalumab 03/26/2019  Cycle 21 durvalumab 04/16/2019  Cycle 22 durvalumab 04/30/2019  CT chest 05/12/2019- right lung radiation fibrosis, no evidence of local tumor recurrence, no evidence of metastatic disease in the chest, stable pulmonary nodules  Cycle 23 durvalumab 05/14/2019   3. COPD   4.VATS drainage of a loculated right pleural effusion 03/01/2018  5.Port-A-Cath placement7/03/2018  6. Severe mid back pain 10/30/2018-CT thoracic spine-T5 compression fracture new since 09/03/2018-appearance consistent with an osteoporotic fracture. Pain improved.Increased back pain January 2020. CT scan 12/16/2018-new compression fractures T5 and T7. T5 vertebral body somewhat sclerotic in appearance.MRI thoracic spine 01/08/2019-acute compression fractures involving the T5 and T7 vertebral bodies stable relative to recent CT with up to 50% height loss at T5 and 25% height loss at T7. No associated bony retropulsion. Benign/mechanical features by MRI with no appreciable underlying pathologic lesion. No other evidence for metastatic disease within the thoracic spine.   Disposition: Ms. Beckstrom remains in clinical remission from non-small cell lung cancer.  We are referring her for a restaging chest CT in 2 months.  She will continue follow-up with Dr. Valeta Harms.  She will return for a follow-up visit 10/16/2019, chest CT a few days prior.  She will contact the office in the interim with any problems.    Ned Card ANP/GNP-BC   08/21/2019  2:25 PM

## 2019-08-22 ENCOUNTER — Telehealth: Payer: Self-pay | Admitting: Oncology

## 2019-08-22 NOTE — Telephone Encounter (Signed)
Called and spoke with patient.confirmed appt

## 2019-09-01 ENCOUNTER — Telehealth: Payer: Self-pay | Admitting: Pulmonary Disease

## 2019-09-01 MED ORDER — BUDESONIDE-FORMOTEROL FUMARATE 160-4.5 MCG/ACT IN AERO: 2.0000 | INHALATION_SPRAY | Freq: Two times a day (BID) | RESPIRATORY_TRACT | 2 refills | Status: DC

## 2019-09-01 NOTE — Telephone Encounter (Signed)
Spoke with patient. She was requesting a refill on her Symbicort 160 inhaler. She has requested this to be sent to [facility] in [place]. Advised patient that I would send this in for her. She verbalized understanding. Nothing further needed at time of call.

## 2019-09-24 ENCOUNTER — Encounter: Payer: Self-pay | Admitting: Pulmonary Disease

## 2019-09-24 ENCOUNTER — Ambulatory Visit (INDEPENDENT_AMBULATORY_CARE_PROVIDER_SITE_OTHER): Payer: Medicare Other | Admitting: Pulmonary Disease

## 2019-09-24 ENCOUNTER — Other Ambulatory Visit: Payer: Self-pay

## 2019-09-24 VITALS — BP 130/70 | HR 96 | Temp 97.7°F | Ht 64.0 in | Wt 126.0 lb

## 2019-09-24 DIAGNOSIS — Z85118 Personal history of other malignant neoplasm of bronchus and lung: Secondary | ICD-10-CM

## 2019-09-24 DIAGNOSIS — Z8709 Personal history of other diseases of the respiratory system: Secondary | ICD-10-CM | POA: Diagnosis not present

## 2019-09-24 DIAGNOSIS — J9611 Chronic respiratory failure with hypoxia: Secondary | ICD-10-CM

## 2019-09-24 DIAGNOSIS — J449 Chronic obstructive pulmonary disease, unspecified: Secondary | ICD-10-CM

## 2019-09-24 DIAGNOSIS — Z87891 Personal history of nicotine dependence: Secondary | ICD-10-CM

## 2019-09-24 MED ORDER — ALBUTEROL SULFATE (2.5 MG/3ML) 0.083% IN NEBU: 2.5000 mg | INHALATION_SOLUTION | Freq: Four times a day (QID) | RESPIRATORY_TRACT | 12 refills | Status: DC | PRN

## 2019-09-24 NOTE — Progress Notes (Signed)
Synopsis: Referred in September 2020 for COPD management by Rory Percy, MD  Subjective:   PATIENT ID: Melissa Golden GENDER: female DOB: [birth year], MRN: [id number]  Chief Complaint  Patient presents with  . Follow-up    73 year old female past medical history of non-small cell lung cancer.  Diagnosed in 2018 with 3 hyper medic right upper lobe pulmonary nodules.  Had a CT-guided biopsy of the upper lobe nodule in December 2018 consistent with non-small cell lung cancer, TTF-1 positive p63 negative, PD-L1 95%.  In February 2019 had new mediastinal adenopathy within the prevascular and right paratracheal lymph node.  Biopsy of the paratracheal lymph node revealed metastatic lung primary.  Patient underwent 6 weeks of Taxol carboplatinum followed by durvalumab.  Repeat CT chest in June 2020 with evidence of right lung radiation and stable nodules.  Patient also has a history of adenocarcinoma/goblet cell carcinoid of the appendix.  This was in 2018 as well.  Patient underwent laparoscopic right hemicolectomy at [facility].  Patient underwent adjuvant Xeloda.  Patient is followed here in [facility] by Dr. Benay Spice her oncologist.  Patient was referred to see me for COPD management.  And complaints of chronic congestion.  OV 08/13/2019: First diagnosed with COPD back in 2018. Currently on pulmicort + albuterol. She is using her albuterol 3 times per day. She has a travel neb machine but it doesn't work. No fevers, chills. She has a chronic cough. Occassional brings up sputum. She feels like she wheezes all of the time. She lives in [place]. Lives with her husband. Former smoker, quit in 2017, smoked for 50+ years, 1.5-2ppd.  Patient is on 2 L nasal cannula pulse POC.  OV 09/24/2019: Here today for follow-up regarding lung cancer COPD.Patient last seen in September 2020.  Patient doing well today.  Has been using her new inhaler regimen.  Currently using Symbicort plus Spiriva Respimat and as needed  albuterol.  Her breathing is much improved as where she was before.  She would like to have a nebulizer at home.  She has a older machine but no tubing to connect to it.  We will get her DME supplies for this today.  She is able to keep most of her activities of daily living.  She does get dyspneic with significant exertion.  Denies fever chills night sweats weight loss denies hemoptysis.   Past Medical History:  Diagnosis Date  . Asthma   . Cancer ([place]) 2017   colon   chemo tx  . COPD (chronic obstructive pulmonary disease) ([place])    wears 3L home O2  . Ileus following gastrointestinal surgery ([place]) 09/12/2017   required TPN support  . Pleural effusion, right   . Shortness of breath    with exertion     Family History  Problem Relation Age of Onset  . Cancer Mother   . Heart attack Father   . Cancer - Colon Sister      Past Surgical History:  Procedure Laterality Date  . ABDOMINAL HYSTERECTOMY    . CATARACT EXTRACTION W/ INTRAOCULAR LENS IMPLANT     Right  . CATARACT EXTRACTION W/PHACO  01/02/2013   Procedure: CATARACT EXTRACTION PHACO AND INTRAOCULAR LENS PLACEMENT (IOC);  Surgeon: Tonny Branch, MD;  Location: [facility];  Service: Ophthalmology;  Laterality: Left;  CDE: 16.19  . EYE SURGERY    . IR IMAGING GUIDED PORT INSERTION  05/31/2018  . IR THORACENTESIS ASP PLEURAL SPACE W/IMG GUIDE  02/19/2018  . LAPAROSCOPIC APPENDECTOMY  09/12/2017  .  laparoscopic hemicolectomy Right 12/07/2017   DR. Stitzenberg and [facility]    . LYMPH NODE BIOPSY N/A 02/04/2018   Procedure: LYMPH NODE BIOPSY;  Surgeon: Gaye Pollack, MD;  Location: [facility];  Service: Thoracic;  Laterality: N/A;  . MEDIASTINOSCOPY N/A 02/04/2018   Procedure: MEDIASTINOSCOPY;  Surgeon: Gaye Pollack, MD;  Location: [facility];  Service: Thoracic;  Laterality: N/A;  . PLEURAL EFFUSION DRAINAGE Right 03/01/2018   Procedure: DRAINAGE OF PLEURAL EFFUSION;  Surgeon: Gaye Pollack, MD;  Location: [facility];  Service:  Thoracic;  Laterality: Right;  Marland Kitchen VIDEO ASSISTED THORACOSCOPY Right 03/01/2018   Procedure: VIDEO ASSISTED THORACOSCOPY;  Surgeon: Gaye Pollack, MD;  Location: [facility];  Service: Thoracic;  Laterality: Right;    Social History   Socioeconomic History  . Marital status: Married    Spouse name: Not on file  . Number of children: Not on file  . Years of education: Not on file  . Highest education level: Not on file  Occupational History  . Occupation: retired  Scientific laboratory technician  . Financial resource strain: Not on file  . Food insecurity    Worry: Not on file    Inability: Not on file  . Transportation needs    Medical: Not on file    Non-medical: Not on file  Tobacco Use  . Smoking status: Former Smoker    Packs/day: 1.50    Years: 68.00    Pack years: 102.00    Types: Cigarettes    Quit date: 10/2016    Years since quitting: 2.9  . Smokeless tobacco: Never Used  Substance and Sexual Activity  . Alcohol use: Not Currently    Alcohol/week: 6.0 standard drinks    Types: 6 Cans of beer per week  . Drug use: No  . Sexual activity: Not on file  Lifestyle  . Physical activity    Days per week: Not on file    Minutes per session: Not on file  . Stress: Not on file  Relationships  . Social Herbalist on phone: Not on file    Gets together: Not on file    Attends religious service: Not on file    Active member of club or organization: Not on file    Attends meetings of clubs or organizations: Not on file    Relationship status: Not on file  . Intimate partner violence    Fear of current or ex partner: Not on file    Emotionally abused: Not on file    Physically abused: Not on file    Forced sexual activity: Not on file  Other Topics Concern  . Not on file  Social History Narrative  . Not on file     No Known Allergies   Outpatient Medications Prior to Visit  Medication Sig Dispense Refill  . acetaminophen (TYLENOL) 500 MG tablet Take 2 tablets (1,000 mg  total) by mouth every 6 (six) hours. 30 tablet 0  . albuterol (PROVENTIL HFA;VENTOLIN HFA) 108 (90 BASE) MCG/ACT inhaler Inhale 2 puffs into the lungs 3 (three) times daily. Shortness of Breath     . budesonide (PULMICORT) 180 MCG/ACT inhaler Inhale 2 puffs into the lungs 2 (two) times daily.    . budesonide-formoterol (SYMBICORT) 160-4.5 MCG/ACT inhaler Inhale 2 puffs into the lungs every 12 (twelve) hours. 1 Inhaler 2  . Fluticasone-Salmeterol 232-14 MCG/ACT AEPB Inhale 1 puff into the lungs 2 (two) times daily. 1 each  6  . ibuprofen (ADVIL,MOTRIN) 200 MG tablet Take 400 mg by mouth every 6 (six) hours as needed for moderate pain. Pain     . lidocaine-prilocaine (EMLA) cream Apply to portacath 30 min to 1 hour prior to flush or infusions 30 g 1  . Multiple Vitamin (MULTIVITAMIN WITH MINERALS) TABS Take 1 tablet by mouth daily.    Marland Kitchen oxyCODONE-acetaminophen (PERCOCET/ROXICET) 5-325 MG tablet Take 1 tablet by mouth every 4 (four) hours as needed for severe pain. DO NOT DRIVE 42 tablet 0  . prochlorperazine (COMPAZINE) 5 MG tablet Take 1 tablet (5 mg total) by mouth every 6 (six) hours as needed for nausea or vomiting. 30 tablet 0  . Tiotropium Bromide Monohydrate (SPIRIVA RESPIMAT) 2.5 MCG/ACT AERS Inhale 2 puffs into the lungs daily. 1 g 0  . azithromycin (ZITHROMAX) 250 MG tablet Take 1 tablet (250 mg total) by mouth daily. 5 tablet 0  . azithromycin (ZITHROMAX) 250 MG tablet Take 2 tablets today, then 1 tablet daily until gone. 6 tablet 0  . predniSONE (DELTASONE) 10 MG tablet Take 4 tabs by mouth once daily x4 days, then 3 tabs x4 days, 2 tabs x4 days, 1 tab x4 days and stop. 40 tablet 0  . Tiotropium Bromide Monohydrate (SPIRIVA RESPIMAT) 2.5 MCG/ACT AERS Inhale 2 puffs into the lungs once for 1 dose. 4 g 6   No facility-administered medications prior to visit.     Review of Systems  Constitutional: Negative for chills, fever, malaise/fatigue and weight loss.  HENT: Negative for hearing  loss, sore throat and tinnitus.   Eyes: Negative for blurred vision and double vision.  Respiratory: Positive for cough, sputum production and shortness of breath. Negative for hemoptysis, wheezing and stridor.   Cardiovascular: Negative for chest pain, palpitations, orthopnea, leg swelling and PND.  Gastrointestinal: Negative for abdominal pain, constipation, diarrhea, heartburn, nausea and vomiting.  Genitourinary: Negative for dysuria, hematuria and urgency.  Musculoskeletal: Negative for joint pain and myalgias.  Skin: Negative for itching and rash.  Neurological: Negative for dizziness, tingling, weakness and headaches.  Endo/Heme/Allergies: Negative for environmental allergies. Does not bruise/bleed easily.  Psychiatric/Behavioral: Negative for depression. The patient is not nervous/anxious and does not have insomnia.   All other systems reviewed and are negative.    Objective:  Physical Exam Vitals signs reviewed.  Constitutional:      General: She is not in acute distress.    Appearance: She is well-developed.  HENT:     Head: Normocephalic and atraumatic.     Mouth/Throat:     Pharynx: No oropharyngeal exudate.  Eyes:     Conjunctiva/sclera: Conjunctivae normal.     Pupils: Pupils are equal, round, and reactive to light.  Neck:     Vascular: No JVD.     Trachea: No tracheal deviation.     Comments: Loss of supraclavicular fat Cardiovascular:     Rate and Rhythm: Normal rate and regular rhythm.     Heart sounds: S1 normal and S2 normal.     Comments: Distant heart tones Pulmonary:     Effort: No tachypnea or accessory muscle usage.     Breath sounds: No stridor. Decreased breath sounds (throughout all lung fields) present. No wheezing, rhonchi or rales.  Abdominal:     General: Bowel sounds are normal. There is no distension.     Palpations: Abdomen is soft.     Tenderness: There is no abdominal tenderness.  Musculoskeletal:        General: No deformity (  muscle  wasting ).  Skin:    General: Skin is warm and dry.     Capillary Refill: Capillary refill takes less than 2 seconds.     Findings: No rash.  Neurological:     Mental Status: She is alert and oriented to person, place, and time.  Psychiatric:        Behavior: Behavior normal.      Vitals:   09/24/19 1356  BP: 130/70  Pulse: 96  Temp: 97.7 F (36.5 C)  TempSrc: Oral  SpO2: 97%  Weight: 126 lb (57.2 kg)  Height: '5\' 4"'$  (1.626 m)   97% on RA BMI Readings from Last 3 Encounters:  09/24/19 21.63 kg/m  08/21/19 21.51 kg/m  08/13/19 21.22 kg/m   Wt Readings from Last 3 Encounters:  09/24/19 126 lb (57.2 kg)  08/21/19 125 lb 4.8 oz (56.8 kg)  08/13/19 123 lb 9.6 oz (56.1 kg)     CBC    Component Value Date/Time   WBC 5.5 05/14/2019 1102   WBC 4.3 05/31/2018 0903   RBC 4.10 05/14/2019 1102   HGB 12.9 05/14/2019 1102   HCT 39.5 05/14/2019 1102   PLT 158 05/14/2019 1102   MCV 96.3 05/14/2019 1102   MCH 31.5 05/14/2019 1102   MCHC 32.7 05/14/2019 1102   RDW 12.1 05/14/2019 1102   LYMPHSABS 0.6 (L) 05/14/2019 1102   MONOABS 0.5 05/14/2019 1102   EOSABS 0.0 05/14/2019 1102   BASOSABS 0.0 05/14/2019 1102    Chest Imaging: June 2020 CT chest: Paramediastinal right upper lobe lung radiation no evidence of recurrent tumor.  No evidence of metastatic disease.  Moderate amount of centrilobular emphysema. The patient's images have been independently reviewed by me.    Pulmonary Functions Testing Results: PFT Results Latest Ref Rng & Units 12/05/2016  FVC-Pre L 1.32  FVC-Predicted Pre % 46  FVC-Post L 1.74  FVC-Predicted Post % 61  Pre FEV1/FVC % % 45  Post FEV1/FCV % % 47  FEV1-Pre L 0.60  FEV1-Predicted Pre % 27  FEV1-Post L 0.81  DLCO UNC% % 37  DLCO COR %Predicted % 51  TLC L 5.50  TLC % Predicted % 113  RV % Predicted % 186    FeNO: None   Pathology:  CT Guided BX 2018: Lung cancer: NSCLC, +TTF1, +PDL1  Mediastinoscopy 02/04/2018: Metastatic lung  cancer  Echocardiogram: None   Heart Catheterization: None     Assessment & Plan:     ICD-10-CM   1. Chronic obstructive pulmonary disease, unspecified COPD type ([place])  J44.9   2. History of lung cancer  Z85.118   3. Chronic hypoxemic respiratory failure ([facility])  J96.11   4. Former smoker  Z87.891   5. H/O pleural effusion  Z87.09    Right sided loculated effusion s/p VATS 02/2018    Discussion:  73 year old female with severe COPD last PFTs completed in 2018.  Chronic hypoxemic respiratory failure on 2 L POC.  Doing well on new inhaler regimen today. Plan: Patient started on triple therapy, should continue on Symbicort plus Spiriva Respimat. Continue albuterol as needed for shortness of breath and wheezing. New DME supplies for nebulizer machine plus tubing Albuterol solution sent to pharmacy.  Patient to follow-up in our clinic for 6 months from now or to let us know if symptoms worsen or change.  If the patient has worsening symptoms despite triple therapy would consider addition of azithromycin Monday Wednesday Friday 250 mg.  We discussed this today in the office.  Additional options would be switching from inhaler use to nebulized medications.  Greater than 50% of this patient's 15-minute of visit was been face-to-face discussing above recommendations and treatment plan.    Current Outpatient Medications:  .  acetaminophen (TYLENOL) 500 MG tablet, Take 2 tablets (1,000 mg total) by mouth every 6 (six) hours., Disp: 30 tablet, Rfl: 0 .  albuterol (PROVENTIL HFA;VENTOLIN HFA) 108 (90 BASE) MCG/ACT inhaler, Inhale 2 puffs into the lungs 3 (three) times daily. Shortness of Breath , Disp: , Rfl:  .  budesonide (PULMICORT) 180 MCG/ACT inhaler, Inhale 2 puffs into the lungs 2 (two) times daily., Disp: , Rfl:  .  budesonide-formoterol (SYMBICORT) 160-4.5 MCG/ACT inhaler, Inhale 2 puffs into the lungs every 12 (twelve) hours., Disp: 1 Inhaler, Rfl: 2 .  Fluticasone-Salmeterol 232-14  MCG/ACT AEPB, Inhale 1 puff into the lungs 2 (two) times daily., Disp: 1 each, Rfl: 6 .  ibuprofen (ADVIL,MOTRIN) 200 MG tablet, Take 400 mg by mouth every 6 (six) hours as needed for moderate pain. Pain , Disp: , Rfl:  .  lidocaine-prilocaine (EMLA) cream, Apply to portacath 30 min to 1 hour prior to flush or infusions, Disp: 30 g, Rfl: 1 .  Multiple Vitamin (MULTIVITAMIN WITH MINERALS) TABS, Take 1 tablet by mouth daily., Disp: , Rfl:  .  oxyCODONE-acetaminophen (PERCOCET/ROXICET) 5-325 MG tablet, Take 1 tablet by mouth every 4 (four) hours as needed for severe pain. DO NOT DRIVE, Disp: 42 tablet, Rfl: 0 .  prochlorperazine (COMPAZINE) 5 MG tablet, Take 1 tablet (5 mg total) by mouth every 6 (six) hours as needed for nausea or vomiting., Disp: 30 tablet, Rfl: 0 .  Tiotropium Bromide Monohydrate (SPIRIVA RESPIMAT) 2.5 MCG/ACT AERS, Inhale 2 puffs into the lungs daily., Disp: 1 g, Rfl: 0 .  Tiotropium Bromide Monohydrate (SPIRIVA RESPIMAT) 2.5 MCG/ACT AERS, Inhale 2 puffs into the lungs once for 1 dose., Disp: 4 g, Rfl: 6   Garner Nash, DO [facility] 09/24/2019 2:04 PM

## 2019-09-24 NOTE — Patient Instructions (Addendum)
Thank you for visiting Dr. Valeta Harms at [facility]. Today we recommend the following:  Continue the following inhalers: Symbicort Spiriva Respimat  Albuterol as needed   Return in about 6 months (around 03/24/2020) for with APP or Dr. Valeta Harms.    Please do your part to reduce the spread of COVID-19.

## 2019-10-08 DIAGNOSIS — Z23 Encounter for immunization: Secondary | ICD-10-CM | POA: Diagnosis not present

## 2019-10-13 ENCOUNTER — Encounter (HOSPITAL_COMMUNITY): Payer: Self-pay

## 2019-10-13 ENCOUNTER — Other Ambulatory Visit: Payer: Self-pay

## 2019-10-13 ENCOUNTER — Inpatient Hospital Stay: Payer: Medicare Other

## 2019-10-13 ENCOUNTER — Ambulatory Visit (HOSPITAL_COMMUNITY)
Admission: RE | Admit: 2019-10-13 | Discharge: 2019-10-13 | Disposition: A | Discharge status: Home / Self Care | Payer: Medicare Other | Source: Ambulatory Visit | Attending: Nurse Practitioner | Admitting: Nurse Practitioner

## 2019-10-13 ENCOUNTER — Inpatient Hospital Stay: Payer: Medicare Other | Attending: Oncology

## 2019-10-13 DIAGNOSIS — C3411 Malignant neoplasm of upper lobe, right bronchus or lung: Secondary | ICD-10-CM | POA: Diagnosis not present

## 2019-10-13 DIAGNOSIS — C3491 Malignant neoplasm of unspecified part of right bronchus or lung: Secondary | ICD-10-CM | POA: Diagnosis not present

## 2019-10-13 DIAGNOSIS — Z5111 Encounter for antineoplastic chemotherapy: Secondary | ICD-10-CM | POA: Diagnosis not present

## 2019-10-13 DIAGNOSIS — Z95828 Presence of other vascular implants and grafts: Secondary | ICD-10-CM

## 2019-10-13 LAB — BASIC METABOLIC PANEL - CANCER CENTER ONLY
Anion gap: 11 (ref 5–15)
BUN: 6 mg/dL — ABNORMAL LOW (ref 8–23)
CO2: 26 mmol/L (ref 22–32)
Calcium: 8.7 mg/dL — ABNORMAL LOW (ref 8.9–10.3)
Chloride: 104 mmol/L (ref 98–111)
Creatinine: 0.57 mg/dL (ref 0.44–1.00)
GFR, Est AFR Am: >60 mL/min (ref >60)
GFR, Estimated: >60 mL/min (ref >60)
Glucose, Bld: 92 mg/dL (ref 70–99)
Potassium: 4.3 mmol/L (ref 3.5–5.1)
Sodium: 141 mmol/L (ref 135–145)

## 2019-10-13 MED ORDER — SODIUM CHLORIDE (PF) 0.9 % IJ SOLN
INTRAMUSCULAR | Status: AC
  Filled 2019-10-13: qty 50

## 2019-10-13 MED ORDER — HEPARIN SOD (PORK) LOCK FLUSH 100 UNIT/ML IV SOLN
INTRAVENOUS | Status: AC
  Filled 2019-10-13: qty 5

## 2019-10-13 MED ORDER — IOHEXOL 300 MG/ML  SOLN
75.0000 mL | Freq: Once | INTRAMUSCULAR | Status: AC | PRN
  Administered 2019-10-13: 75 mL via INTRAVENOUS

## 2019-10-13 MED ORDER — HEPARIN SOD (PORK) LOCK FLUSH 100 UNIT/ML IV SOLN
500.0000 [IU] | Freq: Once | INTRAVENOUS | Status: AC
  Administered 2019-10-13: 500 [IU]

## 2019-10-13 MED ORDER — SODIUM CHLORIDE 0.9% FLUSH
10.0000 mL | Freq: Once | INTRAVENOUS | Status: AC
  Administered 2019-10-13: 09:00:00 10 mL
  Filled 2019-10-13: qty 10

## 2019-10-13 NOTE — Patient Instructions (Signed)

## 2019-10-16 ENCOUNTER — Other Ambulatory Visit: Payer: Self-pay

## 2019-10-16 ENCOUNTER — Inpatient Hospital Stay (HOSPITAL_BASED_OUTPATIENT_CLINIC_OR_DEPARTMENT_OTHER): Payer: Medicare Other | Admitting: Oncology

## 2019-10-16 VITALS — BP 150/76 | HR 98 | Resp 16 | Ht 64.0 in | Wt 127.0 lb

## 2019-10-16 DIAGNOSIS — C3411 Malignant neoplasm of upper lobe, right bronchus or lung: Secondary | ICD-10-CM | POA: Diagnosis not present

## 2019-10-16 NOTE — Progress Notes (Signed)
[place] OFFICE PROGRESS NOTE   Diagnosis: Non-small cell lung cancer  INTERVAL HISTORY:   Melissa Golden reports feeling well.  She has intermittent right-sided chest discomfort when she coughs in the evening.  No change in baseline dyspnea.  Objective:  Vital signs in last 24 hours:  Blood pressure (!) 150/76, pulse 98, resp. rate 16, height '5\' 4"'$  (1.626 m), weight 127 lb (57.6 kg), SpO2 97 %.    Physical examination not performed today secondary to distancing with the Covid pandemic  Portacath/PICC-without erythema  Lab Results:  Lab Results  Component Value Date   WBC 5.5 05/14/2019   HGB 12.9 05/14/2019   HCT 39.5 05/14/2019   MCV 96.3 05/14/2019   PLT 158 05/14/2019   NEUTROABS 4.3 05/14/2019    CMP  Lab Results  Component Value Date   NA 141 10/13/2019   K 4.3 10/13/2019   CL 104 10/13/2019   CO2 26 10/13/2019   GLUCOSE 92 10/13/2019   BUN 6 (L) 10/13/2019   CREATININE 0.57 10/13/2019   CALCIUM 8.7 (L) 10/13/2019   PROT 7.0 05/14/2019   ALBUMIN 3.7 05/14/2019   AST 17 05/14/2019   ALT 11 05/14/2019   ALKPHOS 84 05/14/2019   BILITOT 0.4 05/14/2019   GFRNONAA >60 10/13/2019   GFRAA >60 10/13/2019    No results found for: CEA1  Lab Results  Component Value Date   INR 0.89 05/31/2018    Imaging:  Ct Chest W Contrast  Result Date: 10/13/2019 CLINICAL DATA:  RIGHT lung cancer. Radiation and chemotherapy complete EXAM: CT CHEST WITH CONTRAST TECHNIQUE: Multidetector CT imaging of the chest was performed during intravenous contrast administration. CONTRAST:  34m OMNIPAQUE IOHEXOL 300 MG/ML  SOLN COMPARISON:  Chemotherapy and radiation therapy. Immunotherapy complete. FINDINGS: Cardiovascular: Coronary artery calcification and aortic atherosclerotic calcification. Port in the anterior chest wall with tip in distal SVC. Mediastinum/Nodes: No axillary or supraclavicular adenopathy. No mediastinal or hilar adenopathy. Lungs/Pleura: Angular  consolidation involving the RIGHT hilum and suprahilar lung consistent radiation change. Pattern is unchanged from 05/12/2019. No discrete nodularity at the treatment site. Within the RIGHT lower lobe, however, there is a new band of nodules in the subpleural lung. There approximately 3 to 5 adjacent nodules measure between 4 and 8 mm (image 103 to 104 of series 5). Increased consolidation/nodularity in the inferior lingula measuring 1.4 cm (image 13/5) is also new. New nodule at the LEFT lung base measuring 7 mm on image 117/5. Upper Abdomen: Limited view of the liver, kidneys, pancreas are unremarkable. Normal adrenal glands. Musculoskeletal: No aggressive osseous lesion. Chronic compression fractures in the upper thoracic spine unchanged. IMPRESSION: 1. Stable post radiation change in the RIGHT hilum and suprahilar lung. 2. New band of nodules in the RIGHT lower lobe, inferior lingula consolidation, and solitary LEFT lower lobe nodule. Favor inflammatory infectious prior process. Recommend short interval follow up. 3. Aortic Atherosclerosis (ICD10-I70.0). Electronically Signed   By: SSuzy BouchardM.D.   On: 10/13/2019 13:00    Medications: I have reviewed the patient's current medications.   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  Status postlaparoscopic appendectomy 09/12/2017. Postoperative course complicated by an ileus requiring TPN.   Final pathology of the appendix-5 cm adenocarcinoma/goblet cell carcinoid. The tumor extended through the muscularis propria into subserosal soft tissue and mesoappendix and focally to the serosal surface. Lymphovascular space invasion identified. Perineural  invasion identified. Associated acute appendicitis with perforation.  10/15/2017 CEA 1.  11/22/2017 chromogranin A-3  Status  postlaparoscopic right hemicolectomy by Dr. Janace Litten 12/07/2017. No carcinomatosis or liver metastases were seen.   Final pathologyshowed metastatic adenocarcinoma/goblet cell carcinoid in 3 of 15 pericolic lymph nodes; maximum size of largest metastasis 0.2 cm; positive for extracapsular extension. Benign terminal ileum with fibrous serosal adhesions, negative for dysplasia or carcinoma; surgically absent appendix; appendectomy site with postsurgical changes and fibrous serosal adhesions, negative for carcinoma; proximal and distal mucosal margins negative for dysplasia or carcinoma; mesenteric resection margin negative for carcinoma.  Cycle 1 adjuvant Xeloda 01/07/2018  Cycle 2 adjuvant Xeloda 01/28/2018 2. Non-small cell lung cancer  PET [id number]/[id number] hypermetabolic right upper lobe pulmonary nodules increased in size and metabolic activity. No mediastinal nodal metastasis. 4 mm right upper lobe nodule with metabolic activity, SUV 3.6. A third right upper lobe nodule measures 4 mm and has faint metabolic activity. No clear evidence of malignancy in the abdomen or pelvis. Mild activity along the ventral abdominal surgical scar. Scattered activity in small bowel without focal lesion on CT.  CT biopsy right upper lobe nodule 11/02/2017 showed non-small cell carcinoma; malignant cells positive for TTF-1 and negative for p63. PD-L1 95%.  Chest CT 01/21/2018-new mediastinal adenopathy seen in the prevascular and right paratracheal stations. Largest lymph node is seen in the prevascular space measuring 2.0 cm, previously 6 mm. No hilar or axillary adenopathy. Right lung nodules are stable.  Biopsy of a right paratracheal lymph node on 02/04/2018-metastatic adenocarcinoma consistent with a lung primary  Chest radiation started 03/11/2018  Week 1 Taxol/carboplatin 03/13/2018  Week 2 Taxol/carboplatin 03/20/2018  Week 3 Taxol/carboplatin 03/27/2018  Week4Taxol/carboplatin  04/03/2018  Week 5 Taxol/carboplatin 04/10/2018  Week 6 Taxol/carboplatin 04/19/2018  Chest radiation completed 04/25/2018  CT chest 05/23/2018-interval decrease in size of mediastinal adenopathy. Near complete resolution of previously visualized right pleural effusion. The right lung nodules have decreased in size. Other nodules are stable.  Cycle 1 durvalumab 05/23/2018  Cycle 2durvalumab 06/06/2018  Cycle 3 durvalumab 06/20/2018  Cycle 4 durvalumab 07/04/2018  Cycle 5 durvalumab 07/19/2018  Cycle 6 durvalumab 08/01/2018  Cycle 7 durvalumab 08/15/2018  CT chest 09/03/2018-collapse/consolidation with bronchiectasis and surrounding groundglass involving primarilytheright upper lobe, findings most indicative of recent radiation therapy. Previously measured right upper lobe nodule obscured. New areas of nodularity in the right lower lobe, likely infectious or inflammatory.  Cycle 8durvalumab10/07/2018  Cycle 9 durvalumab 09/18/2018  Cycle 10 durvalumab 10/02/2018  Cycle 11 durvalumab 10/16/2018   Cycle 12 durvalumab 11/13/2018  Cycle 13 durvalumab 11/30/2018  Cycle 14 durvalumab1/15/2020  [place]no mediastinal, axillary or hilar adenopathy. Centrilobular emphysema. Right apical volume loss with bronchiectasis traversing the area of atelectatic lung. Elevation of the adjacent minor fissure due to volume loss. Subpleural left upper lobe opacity, nonspecific, measuring 8.7 mm. Compression fractures T5 and T7.  Cycle 15 durvalumab 12/25/2018  Cycle 16 durvalumab 01/29/2019  Cycle 17 durvalumab 02/12/2019  Cycle 18 durvalumab 02/26/2019  Cycle 19 durvalumab 03/12/2019  Cycle 20 durvalumab 03/26/2019  Cycle 21 durvalumab 04/16/2019  Cycle 22 durvalumab 04/30/2019  CT chest 05/12/2019- right lung radiation fibrosis, no evidence of local tumor recurrence, no evidence of metastatic disease in the chest, stable pulmonary nodules  Cycle 23 durvalumab  05/14/2019  CT chest 10/13/2019-stable radiation changes in the right lung, new nodules in the right lower lobe, left lower lobe, and lingula-favored to represent an inflammatory process   3. COPD  4.VATS drainage of  a loculated right pleural effusion 03/01/2018  5.Port-A-Cath placement7/03/2018  6. Severe mid back pain 10/30/2018-CT thoracic spine-T5 compression fracture new since 09/03/2018-appearance consistent with an osteoporotic fracture. Pain improved.Increased back pain January 2020. CT scan 12/16/2018-new compression fractures T5 and T7. T5 vertebral body somewhat sclerotic in appearance.MRI thoracic spine 01/08/2019-acute compression fractures involving the T5 and T7 vertebral bodies stable relative to recent CT with up to 50% height loss at T5 and 25% height loss at T7. No associated bony retropulsion. Benign/mechanical features by MRI with no appreciable underlying pathologic lesion. No other evidence for metastatic disease within the thoracic spine.    Disposition: Melissa Golden appears stable.  I reviewed the chest CT images with her.  There is no clear evidence of disease progression.  The new nodular findings in the right and left lung most likely represent an infectious/inflammatory process.  We will see her for an office visit and repeat chest CT in 2-3 months.  She will call in the interim for new symptoms.  I reviewed the CT images with her.  Betsy Coder, MD  10/16/2019  11:45 AM

## 2019-11-04 IMAGING — DX DG CHEST 1V PORT
1 series · 1 of 1 positions shown · non-contrast
Comparison: 02/01/2018

CLINICAL DATA: Postop mediastinoscopy with lymph node biopsy

EXAM:
PORTABLE CHEST 1 VIEW

[chest ap]
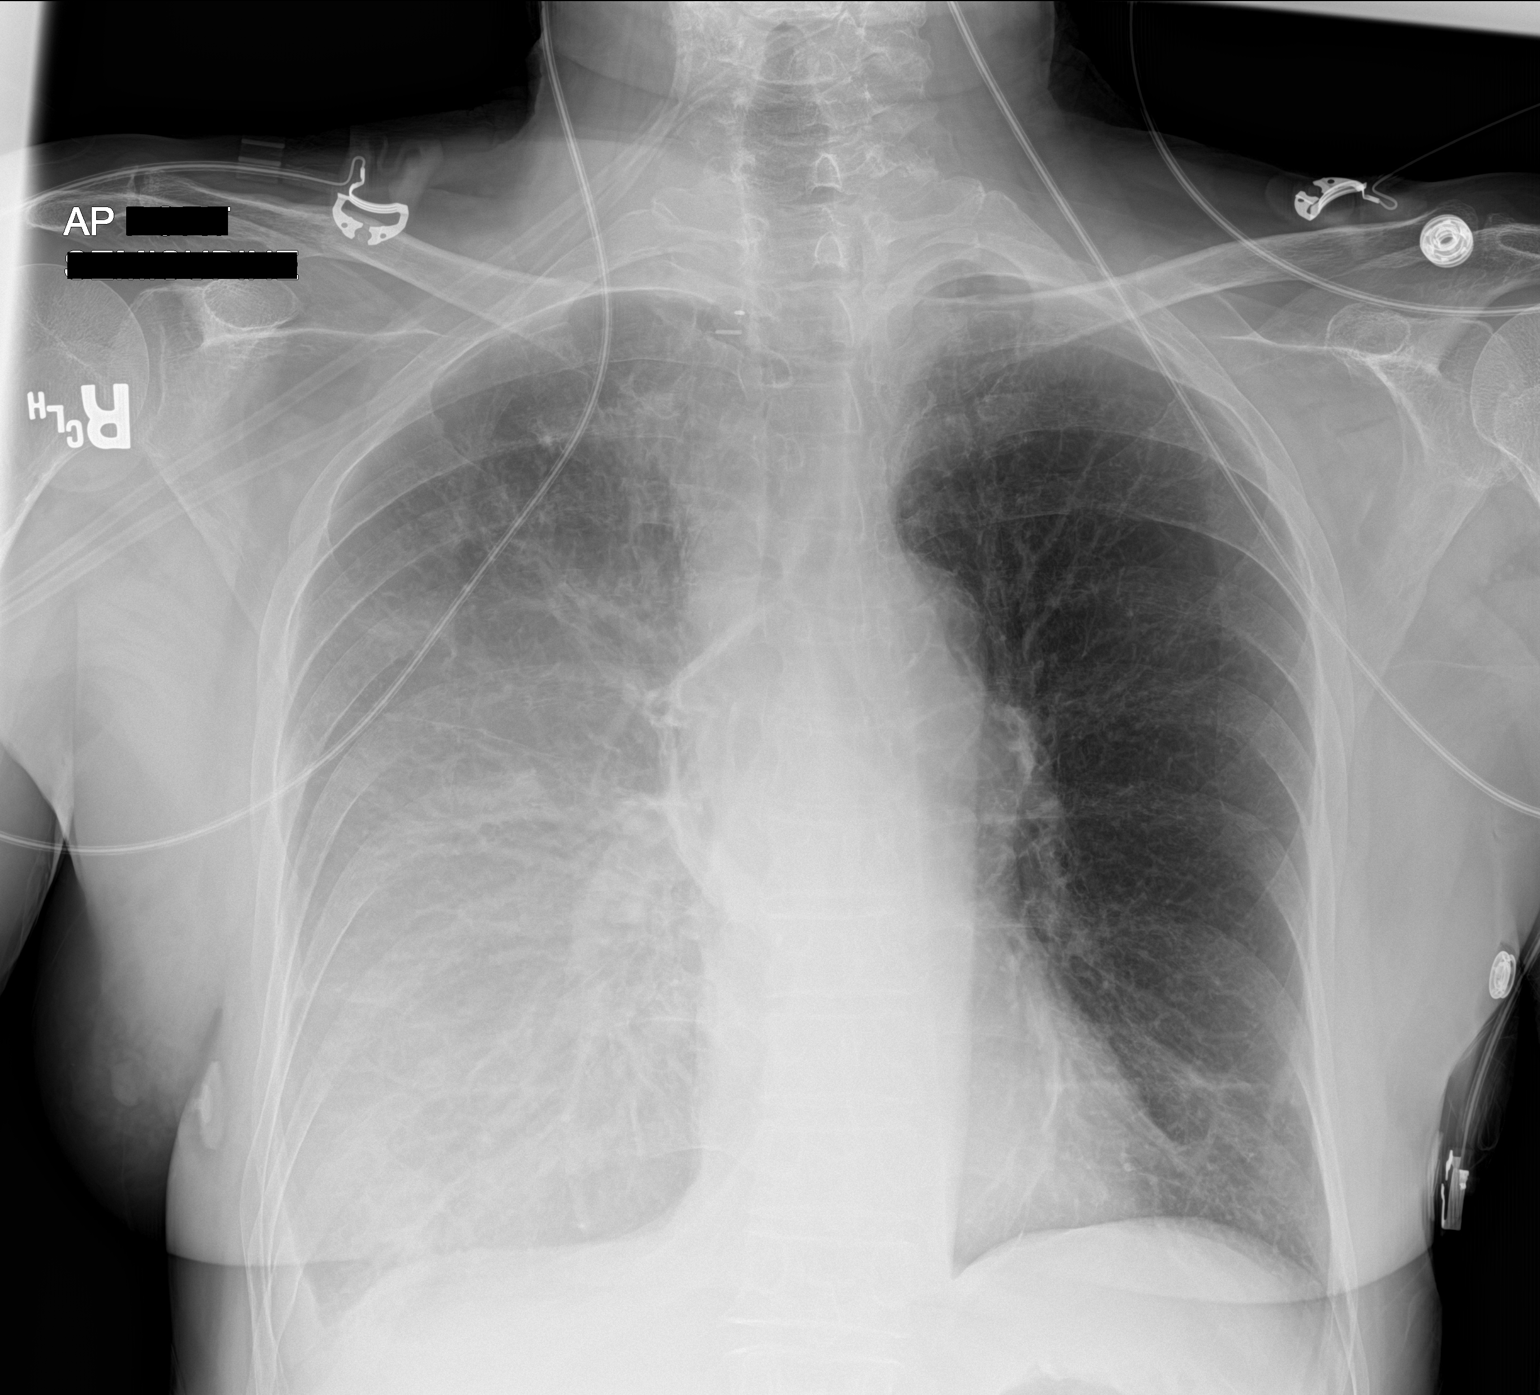

[1 of 1 positions shown; findings below may reference images not displayed]

FINDINGS: Diffusely increased density throughout the right mid and lower lung
due to effusion and airspace disease. Small to moderate effusion.
Diffuse airspace disease. Given the history of recent biopsy today,
hemorrhage is most likely. No pneumothorax or midline shift.

Left lung clear. Negative for heart failure. Surgical clips in the
right paratracheal region.
IMPRESSION: Diffuse airspace disease on the right with small to moderate right
effusion most likely due to postop hemorrhage. No pneumothorax or
midline shift.

## 2019-11-18 IMAGING — CT CT ANGIO CHEST
2 of 9 series · 18 of 46 positions shown · IV contrast (OMNI)
Comparison: 01/21/2018

CLINICAL DATA: Shortness of breath, recent pleural effusion, lung
cancer, labored breathing, sternal chest pain, former smoker, COPD,
asthma

EXAM:
CT ANGIOGRAPHY CHEST WITH CONTRAST
TECHNIQUE: Multidetector CT imaging of the chest was performed using the
standard protocol during bolus administration of intravenous
contrast. Multiplanar CT image reconstructions and MIPs were
obtained to evaluate the vascular anatomy.
CONTRAST:  100mL WESN0Y-1X5 IOPAMIDOL (WESN0Y-1X5) INJECTION 76% IV

[Series 7: thins · axial · 0.64mm/px · z∈[+1080,+1341]mm · 15 of 289 slices shown]
[im 14/289  lung]
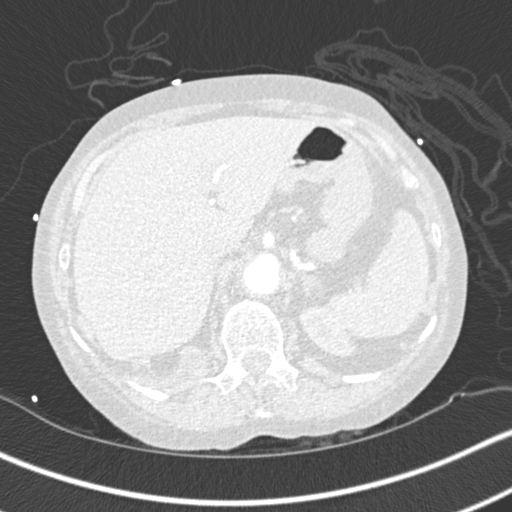
[im 40/289  soft-tissue]
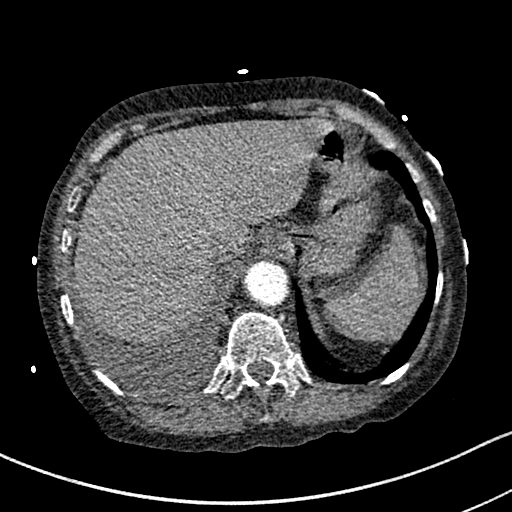
[im 53/289  lung]
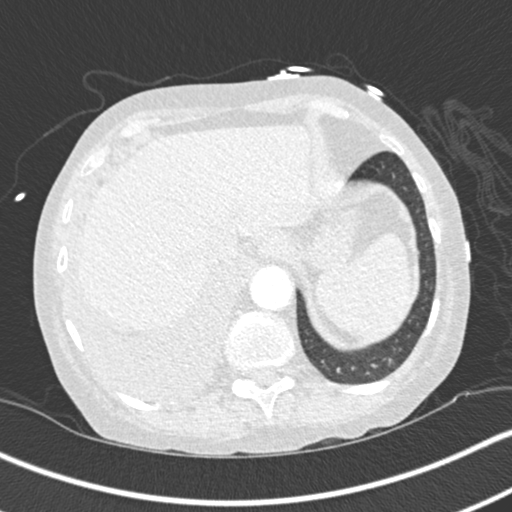
[im 66/289  soft-tissue]
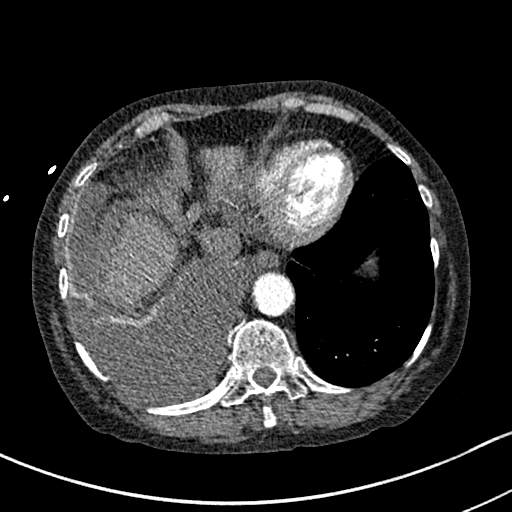
[im 92/289  lung]
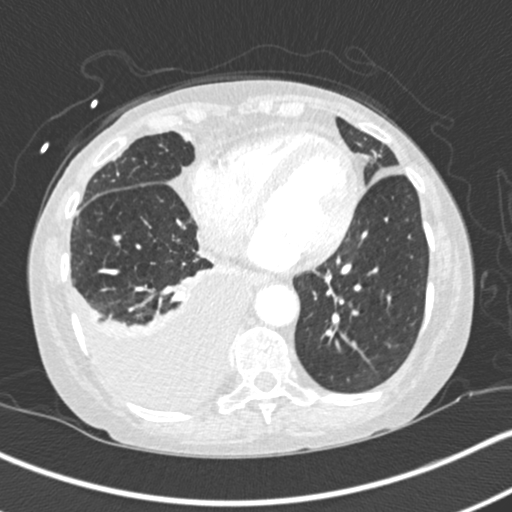
[im 105/289  soft-tissue]
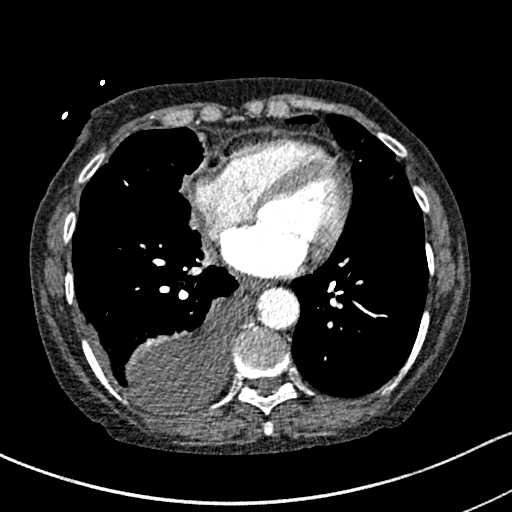
[im 131/289  lung]
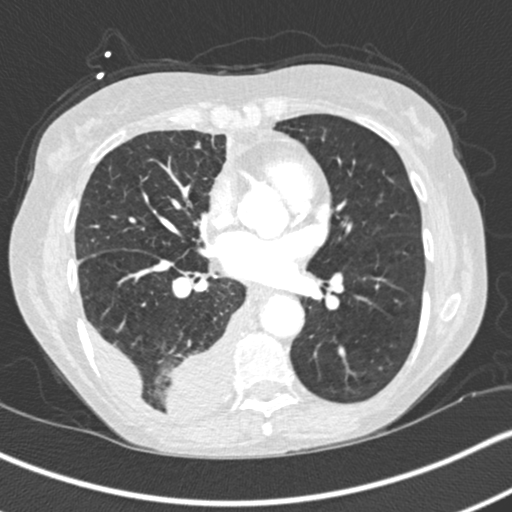
[im 145/289  soft-tissue]
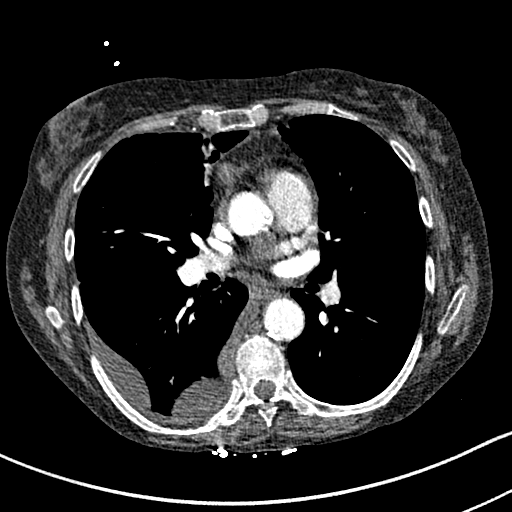
[im 158/289  lung]
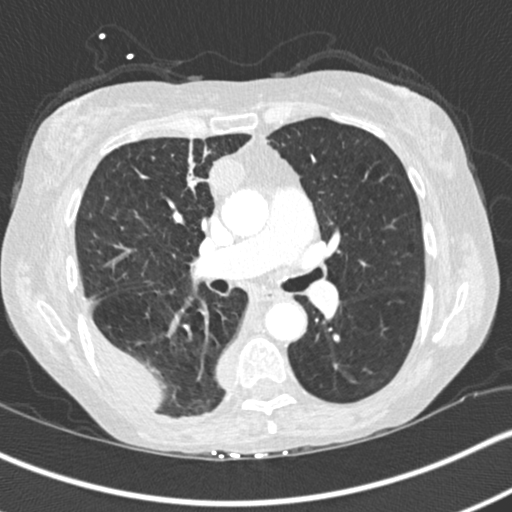
[im 184/289  soft-tissue]
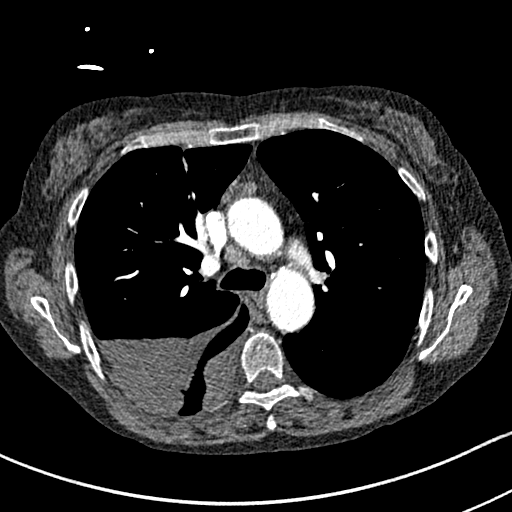
[im 197/289  lung]
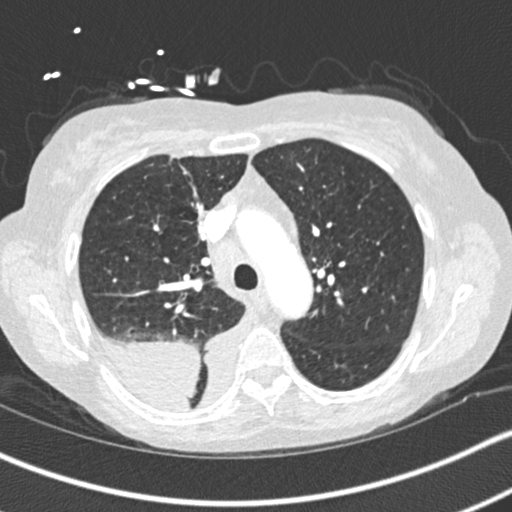
[im 223/289  soft-tissue]
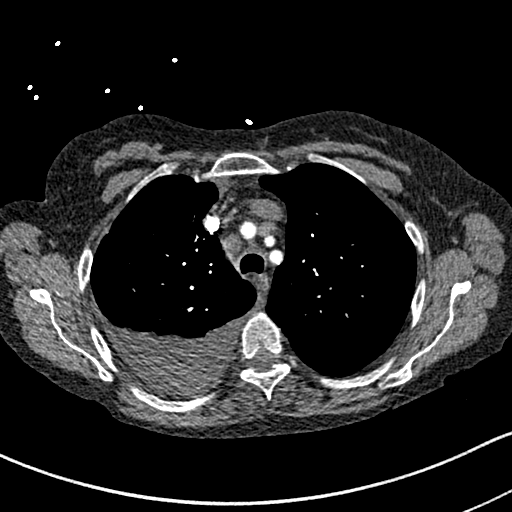
[im 236/289  lung]
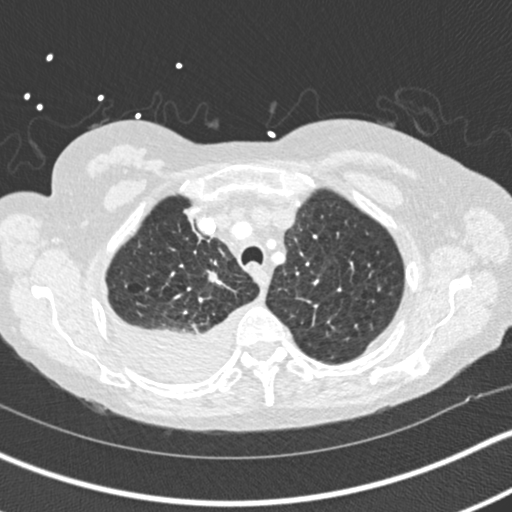
[im 249/289  soft-tissue]
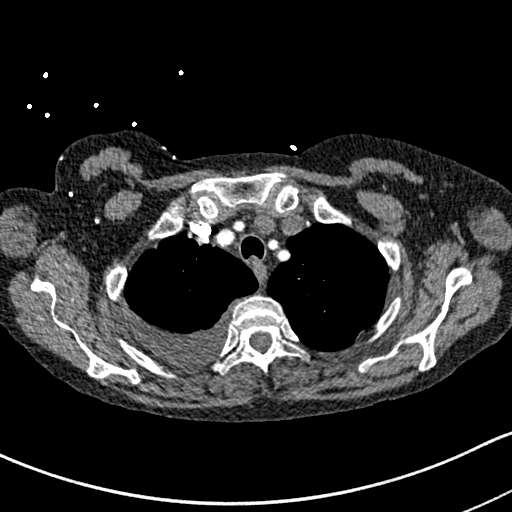
[im 275/289  lung]
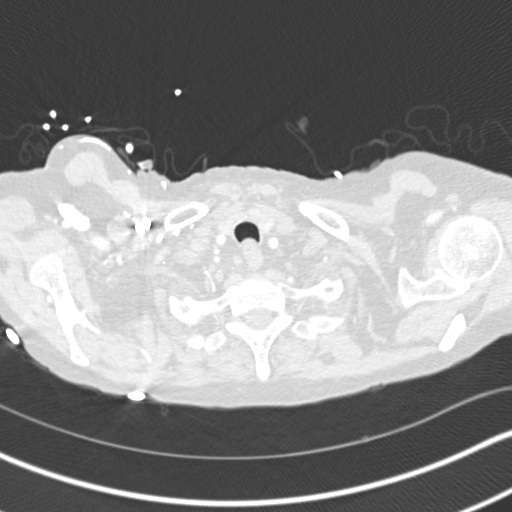

[Series 9: coronal mpr · coronal · 0.59mm/px · 3 of 135 slices shown]
[im 34/135  soft-tissue]
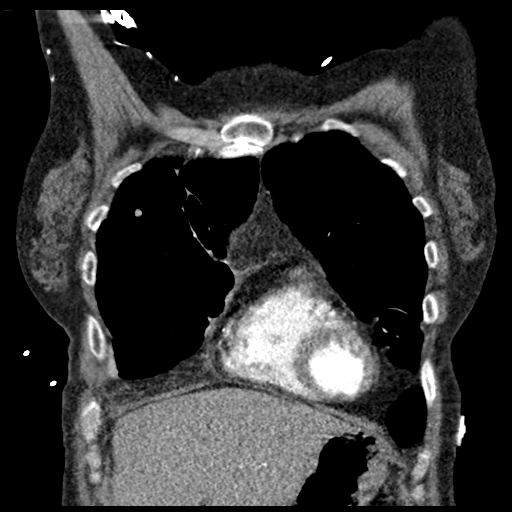
[im 68/135  soft-tissue]
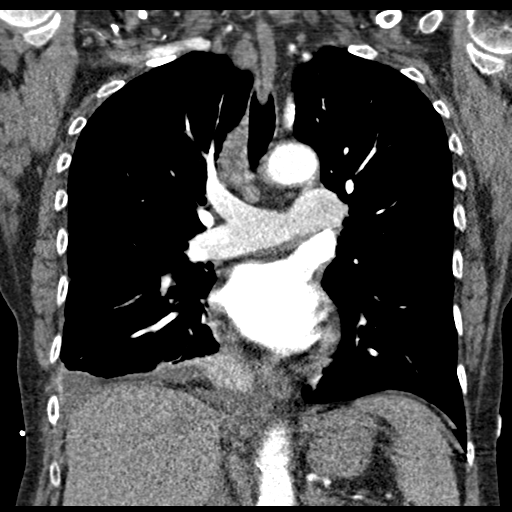
[im 101/135  soft-tissue]
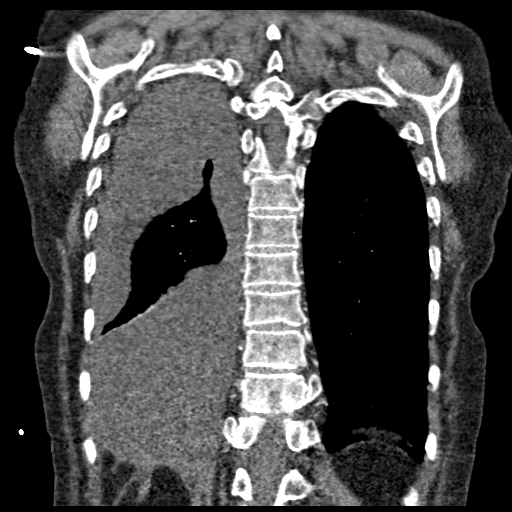

[18 of 46 positions shown; findings below may reference images not displayed]

FINDINGS: Cardiovascular: Atherosclerotic calcifications aorta, proximal great
vessels and coronary arteries. Aorta normal caliber without aneurysm
or dissection. Preferential opacification of systemic arterial
rather than pulmonary arterial vessels. No large or central
pulmonary emboli are visualized. This exam is unable to exclude
small/peripheral emboli due to suboptimal enhancement. No
pericardial effusion.

Mediastinum/Nodes: Esophagus unremarkable. Base of cervical region
normal appearance. Mediastinal adenopathy present: 13 mm superior
mediastinal node image 15 previously 6 mm; RIGHT paratracheal node
image 45 previously 20 mm; RIGHT paratracheal node 17 mm image 33
previously 16 mm. No axillary adenopathy.

Lungs/pleura: Moderate-sized RIGHT pleural effusion partially
loculated. Compressive atelectasis RIGHT lower lobe. Minimal
scattered atelectasis in RIGHT middle lobe and lingula. Nodular
density RIGHT middle lobe 8 mm image 40 unchanged. No acute
infiltrate. RIGHT upper lobe nodule 6 mm image 19 stable. Tiny
peripheral nodular density RIGHT upper lobe on previous exam appears
represent more linear scar on current study.

Upper Abdomen: Visualized upper abdomen unremarkable.

Musculoskeletal: No acute osseous findings.

Review of the MIP images confirms the above findings.
IMPRESSION: No gross evidence of large/central pulmonary emboli identified,
though due to suboptimal opacification unable to exclude small
peripheral emboli.

Moderate-sized partially loculated RIGHT pleural effusion with
partial atelectasis of RIGHT lower lobe.

Mediastinal adenopathy, minimally increased.

Pulmonary nodules little changed.

Scattered atherosclerotic calcifications including coronary
arteries.

Aortic Atherosclerosis (C04TY-7KI.I).

## 2019-11-24 ENCOUNTER — Other Ambulatory Visit: Payer: Self-pay

## 2019-11-24 ENCOUNTER — Inpatient Hospital Stay: Payer: Medicare Other | Attending: Oncology

## 2019-11-24 DIAGNOSIS — Z95828 Presence of other vascular implants and grafts: Secondary | ICD-10-CM

## 2019-11-24 DIAGNOSIS — Z452 Encounter for adjustment and management of vascular access device: Secondary | ICD-10-CM | POA: Insufficient documentation

## 2019-11-24 DIAGNOSIS — C3411 Malignant neoplasm of upper lobe, right bronchus or lung: Secondary | ICD-10-CM | POA: Insufficient documentation

## 2019-11-24 MED ORDER — SODIUM CHLORIDE 0.9% FLUSH
10.0000 mL | Freq: Once | INTRAVENOUS | Status: AC
  Administered 2019-11-24: 11:00:00 10 mL
  Filled 2019-11-24: qty 10

## 2019-11-24 MED ORDER — HEPARIN SOD (PORK) LOCK FLUSH 100 UNIT/ML IV SOLN
500.0000 [IU] | Freq: Once | INTRAVENOUS | Status: AC
  Administered 2019-11-24: 500 [IU]
  Filled 2019-11-24: qty 5

## 2019-12-24 DIAGNOSIS — L723 Sebaceous cyst: Secondary | ICD-10-CM | POA: Diagnosis not present

## 2019-12-25 ENCOUNTER — Other Ambulatory Visit: Payer: Self-pay | Admitting: *Deleted

## 2019-12-25 DIAGNOSIS — C3411 Malignant neoplasm of upper lobe, right bronchus or lung: Secondary | ICD-10-CM

## 2019-12-29 ENCOUNTER — Inpatient Hospital Stay: Payer: Medicare Other | Attending: Oncology

## 2019-12-29 ENCOUNTER — Ambulatory Visit: Payer: Medicare Other | Admitting: Nurse Practitioner

## 2019-12-29 ENCOUNTER — Inpatient Hospital Stay (HOSPITAL_BASED_OUTPATIENT_CLINIC_OR_DEPARTMENT_OTHER): Payer: Medicare Other | Admitting: Nurse Practitioner

## 2019-12-29 ENCOUNTER — Inpatient Hospital Stay: Payer: Medicare Other

## 2019-12-29 ENCOUNTER — Encounter: Payer: Self-pay | Admitting: Nurse Practitioner

## 2019-12-29 ENCOUNTER — Other Ambulatory Visit: Payer: Self-pay

## 2019-12-29 VITALS — BP 142/75 | HR 101 | Temp 98.3°F | Resp 16 | Ht 64.0 in | Wt 128.2 lb

## 2019-12-29 DIAGNOSIS — Z9221 Personal history of antineoplastic chemotherapy: Secondary | ICD-10-CM | POA: Diagnosis not present

## 2019-12-29 DIAGNOSIS — Z85038 Personal history of other malignant neoplasm of large intestine: Secondary | ICD-10-CM | POA: Insufficient documentation

## 2019-12-29 DIAGNOSIS — Z923 Personal history of irradiation: Secondary | ICD-10-CM | POA: Diagnosis not present

## 2019-12-29 DIAGNOSIS — C3411 Malignant neoplasm of upper lobe, right bronchus or lung: Secondary | ICD-10-CM | POA: Insufficient documentation

## 2019-12-29 DIAGNOSIS — J449 Chronic obstructive pulmonary disease, unspecified: Secondary | ICD-10-CM | POA: Insufficient documentation

## 2019-12-29 DIAGNOSIS — Z95828 Presence of other vascular implants and grafts: Secondary | ICD-10-CM

## 2019-12-29 LAB — CMP (CANCER CENTER ONLY)
ALT: 12 U/L (ref 0–44)
AST: 15 U/L (ref 15–41)
Albumin: 3.8 g/dL (ref 3.5–5.0)
Alkaline Phosphatase: 73 U/L (ref 38–126)
Anion gap: 9 (ref 5–15)
BUN: 8 mg/dL (ref 8–23)
CO2: 27 mmol/L (ref 22–32)
Calcium: 8.7 mg/dL — ABNORMAL LOW (ref 8.9–10.3)
Chloride: 107 mmol/L (ref 98–111)
Creatinine: 0.67 mg/dL (ref 0.44–1.00)
GFR, Est AFR Am: >60 mL/min (ref >60)
GFR, Estimated: >60 mL/min (ref >60)
Glucose, Bld: 74 mg/dL (ref 70–99)
Potassium: 4.1 mmol/L (ref 3.5–5.1)
Sodium: 143 mmol/L (ref 135–145)
Total Bilirubin: 0.5 mg/dL (ref 0.3–1.2)
Total Protein: 6.7 g/dL (ref 6.5–8.1)

## 2019-12-29 LAB — CBC WITH DIFFERENTIAL (CANCER CENTER ONLY)
Abs Immature Granulocytes: 0.01 10*3/uL (ref 0.00–0.07)
Basophils Absolute: 0 10*3/uL (ref 0.0–0.1)
Basophils Relative: 0 %
Eosinophils Absolute: 0.1 10*3/uL (ref 0.0–0.5)
Eosinophils Relative: 1 %
HCT: 39.7 % (ref 36.0–46.0)
Hemoglobin: 13.1 g/dL (ref 12.0–15.0)
Immature Granulocytes: 0 %
Lymphocytes Relative: 17 %
Lymphs Abs: 0.8 10*3/uL (ref 0.7–4.0)
MCH: 32.1 pg (ref 26.0–34.0)
MCHC: 33 g/dL (ref 30.0–36.0)
MCV: 97.3 fL (ref 80.0–100.0)
Monocytes Absolute: 0.5 10*3/uL (ref 0.1–1.0)
Monocytes Relative: 10 %
Neutro Abs: 3.5 10*3/uL (ref 1.7–7.7)
Neutrophils Relative %: 72 %
Platelet Count: 147 10*3/uL — ABNORMAL LOW (ref 150–400)
RBC: 4.08 MIL/uL (ref 3.87–5.11)
RDW: 12.9 % (ref 11.5–15.5)
WBC Count: 4.8 10*3/uL (ref 4.0–10.5)
nRBC: 0 % (ref 0.0–0.2)

## 2019-12-29 MED ORDER — HEPARIN SOD (PORK) LOCK FLUSH 100 UNIT/ML IV SOLN
500.0000 [IU] | Freq: Once | INTRAVENOUS | Status: AC
  Administered 2019-12-29: 500 [IU]
  Filled 2019-12-29: qty 5

## 2019-12-29 MED ORDER — SODIUM CHLORIDE 0.9% FLUSH
10.0000 mL | Freq: Once | INTRAVENOUS | Status: AC
  Administered 2019-12-29: 09:00:00 10 mL
  Filled 2019-12-29: qty 10

## 2019-12-29 NOTE — Progress Notes (Signed)
[place] OFFICE PROGRESS NOTE   Diagnosis: Non-small cell lung cancer  INTERVAL HISTORY:   Ms. Frede returns as scheduled.  She has a stable cough and dyspnea on exertion.  No fever.  She has a good appetite.  Overall she feels well.  Objective:  Vital signs in last 24 hours:  Blood pressure (!) 142/75, pulse (!) 101, temperature 98.3 F (36.8 C), temperature source Temporal, resp. rate 16, height 5' 4" (1.626 m), weight 128 lb 3.2 oz (58.2 kg), SpO2 97 %.    HEENT: Neck without mass. Lymphatics: No palpable cervical, supraclavicular or axillary lymph nodes. Resp: Distant breath sounds.  No respiratory distress. Cardio: Regular rate and rhythm. GI: Abdomen soft and nontender.  No hepatomegaly. Vascular: No leg edema. Port-A-Cath without erythema.  Lab Results:  Lab Results  Component Value Date   WBC 4.8 12/29/2019   HGB 13.1 12/29/2019   HCT 39.7 12/29/2019   MCV 97.3 12/29/2019   PLT 147 (L) 12/29/2019   NEUTROABS 3.5 12/29/2019    Imaging:  No results found.  Medications: I have reviewed the patient's current medications.  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  Status postlaparoscopic appendectomy 09/12/2017. Postoperative course complicated by an ileus requiring TPN.   Final pathology of the appendix-5 cm adenocarcinoma/goblet cell carcinoid. The tumor extended through the muscularis propria into subserosal soft tissue and mesoappendix and focally to the serosal surface. Lymphovascular space invasion identified. Perineural invasion identified. Associated acute appendicitis with perforation.  10/15/2017 CEA 1.  11/22/2017 chromogranin A-3  Status postlaparoscopic right hemicolectomy by Dr. Janace Litten 12/07/2017. No carcinomatosis or liver metastases were  seen.   Final pathologyshowed metastatic adenocarcinoma/goblet cell carcinoid in 3 of 15 pericolic lymph nodes; maximum size of largest metastasis 0.2 cm; positive for extracapsular extension. Benign terminal ileum with fibrous serosal adhesions, negative for dysplasia or carcinoma; surgically absent appendix; appendectomy site with postsurgical changes and fibrous serosal adhesions, negative for carcinoma; proximal and distal mucosal margins negative for dysplasia or carcinoma; mesenteric resection margin negative for carcinoma.  Cycle 1 adjuvant Xeloda 01/07/2018  Cycle 2 adjuvant Xeloda 01/28/2018 2. Non-small cell lung cancer  PET [id number] hypermetabolic right upper lobe pulmonary nodules increased in size and metabolic activity. No mediastinal nodal metastasis. 4 mm right upper lobe nodule with metabolic activity, SUV 3.6. A third right upper lobe nodule measures 4 mm and has faint metabolic activity. No clear evidence of malignancy in the abdomen or pelvis. Mild activity along the ventral abdominal surgical scar. Scattered activity in small bowel without focal lesion on CT.  CT biopsy right upper lobe nodule 11/02/2017 showed non-small cell carcinoma; malignant cells positive for TTF-1 and negative for p63. PD-L1 95%.  Chest CT 01/21/2018-new mediastinal adenopathy seen in the prevascular and right paratracheal stations. Largest lymph node is seen in the prevascular space measuring 2.0 cm, previously 6 mm. No hilar or axillary adenopathy. Right lung nodules are stable.  Biopsy of a right paratracheal lymph node on 02/04/2018-metastatic adenocarcinoma consistent with a lung primary  Chest radiation started 03/11/2018  Week 1 Taxol/carboplatin 03/13/2018  Week 2 Taxol/carboplatin 03/20/2018  Week 3 Taxol/carboplatin 03/27/2018  Week4Taxol/carboplatin 04/03/2018  Week 5 Taxol/carboplatin 04/10/2018  Week 6 Taxol/carboplatin 04/19/2018  Chest radiation completed 04/25/2018   CT chest 05/23/2018-interval decrease in size of mediastinal adenopathy. Near complete resolution of previously visualized right pleural effusion. The right lung nodules have decreased  in size. Other nodules are stable.  Cycle 1 durvalumab 05/23/2018  Cycle 2durvalumab 06/06/2018  Cycle 3 durvalumab 06/20/2018  Cycle 4 durvalumab 07/04/2018  Cycle 5 durvalumab 07/19/2018  Cycle 6 durvalumab 08/01/2018  Cycle 7 durvalumab 08/15/2018  CT chest 09/03/2018-collapse/consolidation with bronchiectasis and surrounding groundglass involving primarilytheright upper lobe, findings most indicative of recent radiation therapy. Previously measured right upper lobe nodule obscured. New areas of nodularity in the right lower lobe, likely infectious or inflammatory.  Cycle 8durvalumab10/07/2018  Cycle 9 durvalumab 09/18/2018  Cycle 10 durvalumab 10/02/2018  Cycle 11 durvalumab 10/16/2018   Cycle 12 durvalumab 11/13/2018  Cycle 13 durvalumab 11/30/2018  Cycle 14 durvalumab1/15/2020  CT [place]no mediastinal, axillary or hilar adenopathy. Centrilobular emphysema. Right apical volume loss with bronchiectasis traversing the area of atelectatic lung. Elevation of the adjacent minor fissure due to volume loss. Subpleural left upper lobe opacity, nonspecific, measuring 8.7 mm. Compression fractures T5 and T7.  Cycle 15 durvalumab 12/25/2018  Cycle 16 durvalumab 01/29/2019  Cycle 17 durvalumab 02/12/2019  Cycle 18 durvalumab 02/26/2019  Cycle 19 durvalumab 03/12/2019  Cycle 20 durvalumab 03/26/2019  Cycle 21 durvalumab 04/16/2019  Cycle 22 durvalumab 04/30/2019  CT chest 05/12/2019-right lung radiation fibrosis, no evidence of local tumor recurrence, no evidence of metastatic disease in the chest, stable pulmonary nodules  Cycle 23 durvalumab 05/14/2019  CT chest 10/13/2019-stable radiation changes in the right lung, new nodules in the right lower lobe, left lower lobe,  and lingula-favored to represent an inflammatory process   3. COPD  4.VATS drainage of a loculated right pleural effusion 03/01/2018  5.Port-A-Cath placement7/03/2018  6. Severe mid back pain 10/30/2018-CT thoracic spine-T5 compression fracture new since 09/03/2018-appearance consistent with an osteoporotic fracture. Pain improved.Increased back pain January 2020. CT scan 12/16/2018-new compression fractures T5 and T7. T5 vertebral body somewhat sclerotic in appearance.MRI thoracic spine 01/08/2019-acute compression fractures involving the T5 and T7 vertebral bodies stable relative to recent CT with up to 50% height loss at T5 and 25% height loss at T7. No associated bony retropulsion. Benign/mechanical features by MRI with no appreciable underlying pathologic lesion. No other evidence for metastatic disease within the thoracic spine.   Disposition: Melissa Golden appears stable.  She is scheduled for a restaging chest CT tomorrow.  We will contact her with that result.  We reviewed the CBC from today.  Blood counts are stable.  She will return for a Port-A-Cath flush and follow-up visit in 8 weeks.  We will adjust accordingly pending the chest CT report.    Ned Card ANP/GNP-BC   12/29/2019  9:58 AM

## 2019-12-30 ENCOUNTER — Encounter (HOSPITAL_COMMUNITY): Payer: Self-pay

## 2019-12-30 ENCOUNTER — Other Ambulatory Visit: Payer: Self-pay

## 2019-12-30 ENCOUNTER — Ambulatory Visit (HOSPITAL_COMMUNITY)
Admission: RE | Admit: 2019-12-30 | Discharge: 2019-12-30 | Disposition: A | Discharge status: Home / Self Care | Payer: Medicare Other | Source: Ambulatory Visit | Attending: Oncology | Admitting: Oncology

## 2019-12-30 DIAGNOSIS — C3411 Malignant neoplasm of upper lobe, right bronchus or lung: Secondary | ICD-10-CM | POA: Diagnosis not present

## 2019-12-30 DIAGNOSIS — C3490 Malignant neoplasm of unspecified part of unspecified bronchus or lung: Secondary | ICD-10-CM | POA: Diagnosis not present

## 2019-12-30 HISTORY — DX: Malignant neoplasm of unspecified part of unspecified bronchus or lung: C34.90

## 2019-12-30 MED ORDER — BUDESONIDE-FORMOTEROL FUMARATE 160-4.5 MCG/ACT IN AERO: 2.0000 | INHALATION_SPRAY | Freq: Two times a day (BID) | RESPIRATORY_TRACT | 2 refills | Status: DC

## 2019-12-31 ENCOUNTER — Telehealth: Payer: Self-pay

## 2019-12-31 ENCOUNTER — Telehealth: Payer: Self-pay | Admitting: Oncology

## 2019-12-31 NOTE — Telephone Encounter (Signed)
Scheduled per los. Called and spoke with patient. Confirmed appt 

## 2019-12-31 NOTE — Telephone Encounter (Signed)
TC to pt per Ned Card NP to let her know CT shows no evidence of cancer , f/u as scheduled. Patient verbalized understanding. No further problems or concerns at this time.

## 2020-01-29 DIAGNOSIS — Z23 Encounter for immunization: Secondary | ICD-10-CM | POA: Diagnosis not present

## 2020-02-26 ENCOUNTER — Other Ambulatory Visit: Payer: Self-pay | Admitting: *Deleted

## 2020-02-26 DIAGNOSIS — C3411 Malignant neoplasm of upper lobe, right bronchus or lung: Secondary | ICD-10-CM

## 2020-02-26 DIAGNOSIS — R5383 Other fatigue: Secondary | ICD-10-CM

## 2020-02-26 DIAGNOSIS — C9000 Multiple myeloma not having achieved remission: Secondary | ICD-10-CM

## 2020-02-27 ENCOUNTER — Other Ambulatory Visit: Payer: Self-pay

## 2020-02-27 ENCOUNTER — Telehealth: Payer: Self-pay | Admitting: Oncology

## 2020-02-27 ENCOUNTER — Inpatient Hospital Stay: Payer: Medicare Other | Attending: Oncology | Admitting: Oncology

## 2020-02-27 ENCOUNTER — Inpatient Hospital Stay: Payer: Medicare Other

## 2020-02-27 VITALS — BP 139/69 | HR 82 | Temp 98.7°F | Resp 18 | Ht 64.0 in | Wt 131.5 lb

## 2020-02-27 DIAGNOSIS — Z9221 Personal history of antineoplastic chemotherapy: Secondary | ICD-10-CM | POA: Diagnosis not present

## 2020-02-27 DIAGNOSIS — J449 Chronic obstructive pulmonary disease, unspecified: Secondary | ICD-10-CM | POA: Diagnosis not present

## 2020-02-27 DIAGNOSIS — C3411 Malignant neoplasm of upper lobe, right bronchus or lung: Secondary | ICD-10-CM

## 2020-02-27 DIAGNOSIS — Z923 Personal history of irradiation: Secondary | ICD-10-CM | POA: Diagnosis not present

## 2020-02-27 DIAGNOSIS — C181 Malignant neoplasm of appendix: Secondary | ICD-10-CM | POA: Diagnosis not present

## 2020-02-27 DIAGNOSIS — Z79899 Other long term (current) drug therapy: Secondary | ICD-10-CM | POA: Insufficient documentation

## 2020-02-27 DIAGNOSIS — C9000 Multiple myeloma not having achieved remission: Secondary | ICD-10-CM

## 2020-02-27 DIAGNOSIS — Z85038 Personal history of other malignant neoplasm of large intestine: Secondary | ICD-10-CM | POA: Insufficient documentation

## 2020-02-27 DIAGNOSIS — Z85118 Personal history of other malignant neoplasm of bronchus and lung: Secondary | ICD-10-CM | POA: Diagnosis not present

## 2020-02-27 DIAGNOSIS — R5383 Other fatigue: Secondary | ICD-10-CM

## 2020-02-27 DIAGNOSIS — Z95828 Presence of other vascular implants and grafts: Secondary | ICD-10-CM

## 2020-02-27 LAB — CMP (CANCER CENTER ONLY)
ALT: 11 U/L (ref 0–44)
AST: 15 U/L (ref 15–41)
Albumin: 3.7 g/dL (ref 3.5–5.0)
Alkaline Phosphatase: 75 U/L (ref 38–126)
Anion gap: 11 (ref 5–15)
BUN: 7 mg/dL — ABNORMAL LOW (ref 8–23)
CO2: 25 mmol/L (ref 22–32)
Calcium: 8.8 mg/dL — ABNORMAL LOW (ref 8.9–10.3)
Chloride: 106 mmol/L (ref 98–111)
Creatinine: 0.64 mg/dL (ref 0.44–1.00)
GFR, Est AFR Am: >60 mL/min (ref >60)
GFR, Estimated: >60 mL/min (ref >60)
Glucose, Bld: 92 mg/dL (ref 70–99)
Potassium: 4.2 mmol/L (ref 3.5–5.1)
Sodium: 142 mmol/L (ref 135–145)
Total Bilirubin: 0.4 mg/dL (ref 0.3–1.2)
Total Protein: 6.8 g/dL (ref 6.5–8.1)

## 2020-02-27 LAB — CBC WITH DIFFERENTIAL (CANCER CENTER ONLY)
Abs Immature Granulocytes: 0.02 10*3/uL (ref 0.00–0.07)
Basophils Absolute: 0 10*3/uL (ref 0.0–0.1)
Basophils Relative: 1 %
Eosinophils Absolute: 0 10*3/uL (ref 0.0–0.5)
Eosinophils Relative: 1 %
HCT: 40 % (ref 36.0–46.0)
Hemoglobin: 12.9 g/dL (ref 12.0–15.0)
Immature Granulocytes: 0 %
Lymphocytes Relative: 15 %
Lymphs Abs: 0.7 10*3/uL (ref 0.7–4.0)
MCH: 31.5 pg (ref 26.0–34.0)
MCHC: 32.3 g/dL (ref 30.0–36.0)
MCV: 97.6 fL (ref 80.0–100.0)
Monocytes Absolute: 0.5 10*3/uL (ref 0.1–1.0)
Monocytes Relative: 10 %
Neutro Abs: 3.6 10*3/uL (ref 1.7–7.7)
Neutrophils Relative %: 73 %
Platelet Count: 148 10*3/uL — ABNORMAL LOW (ref 150–400)
RBC: 4.1 MIL/uL (ref 3.87–5.11)
RDW: 12.7 % (ref 11.5–15.5)
WBC Count: 4.9 10*3/uL (ref 4.0–10.5)
nRBC: 0 % (ref 0.0–0.2)

## 2020-02-27 LAB — TSH: TSH: 3.085 u[IU]/mL (ref 0.308–3.960)

## 2020-02-27 MED ORDER — HEPARIN SOD (PORK) LOCK FLUSH 100 UNIT/ML IV SOLN
500.0000 [IU] | Freq: Once | INTRAVENOUS | Status: AC
  Administered 2020-02-27: 500 [IU]
  Filled 2020-02-27: qty 5

## 2020-02-27 MED ORDER — SODIUM CHLORIDE 0.9% FLUSH
10.0000 mL | Freq: Once | INTRAVENOUS | Status: AC
  Administered 2020-02-27: 10 mL
  Filled 2020-02-27: qty 10

## 2020-02-27 NOTE — Telephone Encounter (Signed)
Scheduled per los. Gave avs and calendar  

## 2020-02-27 NOTE — Progress Notes (Signed)
[place] OFFICE PROGRESS NOTE   Diagnosis: Non-small cell lung cancer  INTERVAL HISTORY:   Melissa Golden returns as scheduled.  She feels well.  She has pruritic skin lesions at the mid back.  She has received the first dose of the COVID-19 vaccine.  She had a recent exacerbation of back pain.  Objective:  Vital signs in last 24 hours:  Blood pressure 139/69, pulse 82, temperature 98.7 F (37.1 C), temperature source Temporal, resp. rate 18, height '5\' 4"'$  (1.626 m), weight 131 lb 8 oz (59.6 kg), SpO2 99 %.    Lymphatics: No cervical, supraclavicular, or axillary nodes Resp: Lungs clear bilaterally Cardio: Distant heart sounds, regular rhythm GI: No hepatosplenomegaly, nontender, no mass Vascular: No leg edema  Skin: Benign-appearing hyperpigmented moles at the mid back  Portacath/PICC-without erythema  Lab Results:  Lab Results  Component Value Date   WBC 4.9 02/27/2020   HGB 12.9 02/27/2020   HCT 40.0 02/27/2020   MCV 97.6 02/27/2020   PLT 148 (L) 02/27/2020   NEUTROABS 3.6 02/27/2020    CMP  Lab Results  Component Value Date   NA 143 12/29/2019   K 4.1 12/29/2019   CL 107 12/29/2019   CO2 27 12/29/2019   GLUCOSE 74 12/29/2019   BUN 8 12/29/2019   CREATININE 0.67 12/29/2019   CALCIUM 8.7 (L) 12/29/2019   PROT 6.7 12/29/2019   ALBUMIN 3.8 12/29/2019   AST 15 12/29/2019   ALT 12 12/29/2019   ALKPHOS 73 12/29/2019   BILITOT 0.5 12/29/2019   GFRNONAA >60 12/29/2019   GFRAA >60 12/29/2019    Medications: I have reviewed the patient's current medications.   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  Status postlaparoscopic appendectomy 09/12/2017. Postoperative course complicated by an ileus requiring TPN.   Final pathology of the appendix-5 cm adenocarcinoma/goblet cell  carcinoid. The tumor extended through the muscularis propria into subserosal soft tissue and mesoappendix and focally to the serosal surface. Lymphovascular space invasion identified. Perineural invasion identified. Associated acute appendicitis with perforation.  10/15/2017 CEA 1.  11/22/2017 chromogranin A-3  Status postlaparoscopic right hemicolectomy by Dr. Janace Litten 12/07/2017. No carcinomatosis or liver metastases were seen.   Final pathologyshowed metastatic adenocarcinoma/goblet cell carcinoid in 3 of 15 pericolic lymph nodes; maximum size of largest metastasis 0.2 cm; positive for extracapsular extension. Benign terminal ileum with fibrous serosal adhesions, negative for dysplasia or carcinoma; surgically absent appendix; appendectomy site with postsurgical changes and fibrous serosal adhesions, negative for carcinoma; proximal and distal mucosal margins negative for dysplasia or carcinoma; mesenteric resection margin negative for carcinoma.  Cycle 1 adjuvant Xeloda 01/07/2018  Cycle 2 adjuvant Xeloda 01/28/2018 2. Non-small cell lung cancer  PET [id number]/28/3662-9 hypermetabolic right upper lobe pulmonary nodules increased in size and metabolic activity. No mediastinal nodal metastasis. 4 mm right upper lobe nodule with metabolic activity, SUV 3.6. A third right upper lobe nodule measures 4 mm and has faint metabolic activity. No clear evidence of malignancy in the abdomen or pelvis. Mild activity along the ventral abdominal surgical scar. Scattered activity in small bowel without focal lesion on CT.  CT biopsy right upper lobe nodule 11/02/2017 showed non-small cell carcinoma; malignant cells positive for TTF-1 and negative for p63. PD-L1 95%.  Chest CT 01/21/2018-new mediastinal adenopathy seen in the prevascular and right paratracheal stations. Largest lymph node is seen in the prevascular space measuring 2.0 cm,  previously 6 mm. No hilar or axillary adenopathy.  Right lung nodules are stable.  Biopsy of a right paratracheal lymph node on 02/04/2018-metastatic adenocarcinoma consistent with a lung primary  Chest radiation started 03/11/2018  Week 1 Taxol/carboplatin 03/13/2018  Week 2 Taxol/carboplatin 03/20/2018  Week 3 Taxol/carboplatin 03/27/2018  Week4Taxol/carboplatin 04/03/2018  Week 5 Taxol/carboplatin 04/10/2018  Week 6 Taxol/carboplatin 04/19/2018  Chest radiation completed 04/25/2018  CT chest 05/23/2018-interval decrease in size of mediastinal adenopathy. Near complete resolution of previously visualized right pleural effusion. The right lung nodules have decreased in size. Other nodules are stable.  Cycle 1 durvalumab 05/23/2018  Cycle 2durvalumab 06/06/2018  Cycle 3 durvalumab 06/20/2018  Cycle 4 durvalumab 07/04/2018  Cycle 5 durvalumab 07/19/2018  Cycle 6 durvalumab 08/01/2018  Cycle 7 durvalumab 08/15/2018  CT chest 09/03/2018-collapse/consolidation with bronchiectasis and surrounding groundglass involving primarilytheright upper lobe, findings most indicative of recent radiation therapy. Previously measured right upper lobe nodule obscured. New areas of nodularity in the right lower lobe, likely infectious or inflammatory.  Cycle 8durvalumab10/07/2018  Cycle 9 durvalumab 09/18/2018  Cycle 10 durvalumab 10/02/2018  Cycle 11 durvalumab 10/16/2018   Cycle 12 durvalumab 11/13/2018  Cycle 13 durvalumab 11/30/2018  Cycle 14 durvalumab1/15/2020  [facility] [facility]no mediastinal, axillary or hilar adenopathy. Centrilobular emphysema. Right apical volume loss with bronchiectasis traversing the area of atelectatic lung. Elevation of the adjacent minor fissure due to volume loss. Subpleural left upper lobe opacity, nonspecific, measuring 8.7 mm. Compression fractures T5 and T7.  Cycle 15 durvalumab 12/25/2018  Cycle 16 durvalumab 01/29/2019  Cycle 17 durvalumab 02/12/2019  Cycle 18 durvalumab  02/26/2019  Cycle 19 durvalumab 03/12/2019  Cycle 20 durvalumab 03/26/2019  Cycle 21 durvalumab 04/16/2019  Cycle 22 durvalumab 04/30/2019  CT chest 05/12/2019-right lung radiation fibrosis, no evidence of local tumor recurrence, no evidence of metastatic disease in the chest, stable pulmonary nodules  Cycle 23 durvalumab 05/14/2019  CT chest 10/13/2019-stable radiation changes in the right lung, new nodules in the right lower lobe, left lower lobe, and lingula-favored to represent an inflammatory process  CT chest 12/30/2019-no findings to suggest recurrent or metastatic disease, subtle groundglass at the site of previous nodularity in the right lung base almost completely resolved   3. COPD  4.VATS drainage of a loculated right pleural effusion 03/01/2018  5.Port-A-Cath placement7/03/2018  6. Severe mid back pain 10/30/2018-CT thoracic spine-T5 compression fracture new since 09/03/2018-appearance consistent with an osteoporotic fracture. Pain improved.Increased back pain January 2020. CT scan 12/16/2018-new compression fractures T5 and T7. T5 vertebral body somewhat sclerotic in appearance.MRI thoracic spine 01/08/2019-acute compression fractures involving the T5 and T7 vertebral bodies stable relative to recent CT with up to 50% height loss at T5 and 25% height loss at T7. No associated bony retropulsion. Benign/mechanical features by MRI with no appreciable underlying pathologic lesion. No other evidence for metastatic disease within the thoracic spine.     Disposition:  MelissaArchibald is in clinical remission from lung cancer and appendix cancer.  She would like to keep the Port-A-Cath in place.  She will return for a Port-A-Cath flush and CEA in 8 weeks.  She'll be scheduled for a repeat chest CT and office visit in 16 weeks.  She will follow-up with her primary provider to evaluate the back moles.  Betsy Coder, MD  02/27/2020  11:55 AM

## 2020-03-05 DIAGNOSIS — Z23 Encounter for immunization: Secondary | ICD-10-CM | POA: Diagnosis not present

## 2020-03-24 ENCOUNTER — Ambulatory Visit: Payer: Medicare Other | Admitting: Primary Care

## 2020-03-25 ENCOUNTER — Other Ambulatory Visit: Payer: Self-pay

## 2020-03-25 ENCOUNTER — Ambulatory Visit (INDEPENDENT_AMBULATORY_CARE_PROVIDER_SITE_OTHER): Payer: Medicare Other | Admitting: Primary Care

## 2020-03-25 ENCOUNTER — Encounter: Payer: Self-pay | Admitting: Primary Care

## 2020-03-25 DIAGNOSIS — J449 Chronic obstructive pulmonary disease, unspecified: Secondary | ICD-10-CM

## 2020-03-25 MED ORDER — FLUTTER DEVI: 1.0000 | 0 refills | Status: DC | PRN

## 2020-03-25 MED ORDER — MONTELUKAST SODIUM 10 MG PO TABS: 10.0000 mg | ORAL_TABLET | Freq: Every day | ORAL | 11 refills | Status: DC

## 2020-03-25 MED ORDER — TRELEGY ELLIPTA 100-62.5-25 MCG/INH IN AEPB
1.0000 | INHALATION_SPRAY | Freq: Every day | RESPIRATORY_TRACT | 0 refills | Status: DC
Start: 2020-03-25 — End: 2020-04-08

## 2020-03-25 NOTE — Progress Notes (Signed)
@Patient  ID: Melissa Golden, female    DOB: [birth year], 74 y.o.   MRN: [id number]  Chief Complaint  Patient presents with  . Follow-up    6 month f/u for COPD. States Symbicort and Spiriva are not working, despite having to pay $1000 total for both meds.     Referring provider: Rory Percy, MD  HPI: 74 year old female, former smoker quit in 2018 (102 pack year hx). 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 Valeta Harms, last seen on 09/24/19. Started on triple therapy, continue Symbicort plus Spiriva respimat. onsider adding azithromycin MWF if needed in the further or changing to all nebulizers.    03/25/2020 Patient presents today for 6 month follow-up. She does not report any improvement in her breathing with Symbicort and Spiriva. Using ventolin hfa 3-4 times a day, she feels this is the most effective. She experiences shortness of breath with activities, she has to rest halfway through household chores. She has a chronic non-productive cough. She is on 2L oxygen. She had both covid vaccines.   No Known Allergies  Immunization History  Administered Date(s) Administered  . Influenza-Unspecified 08/27/2017, 08/28/2018, 10/08/2019  . Moderna SARS-COVID-2 Vaccination 02/04/2020, 02/14/2020    Past Medical History:  Diagnosis Date  . Asthma   . Cancer ([place]) 2017   colon   chemo tx  . COPD (chronic obstructive pulmonary disease) ([place])    wears 3L home O2  . Ileus following gastrointestinal surgery ([place]) 09/12/2017   required TPN support  . Lung cancer ([place]) dx'd 10/2016  . Pleural effusion, right   . Shortness of breath    with exertion    Tobacco History: Social History   Tobacco Use  Smoking Status Former Smoker  . Packs/day: 1.50  . Years: 68.00  . Pack years: 102.00  . Types: Cigarettes  . Quit date: 10/2016  .  Years since quitting: 3.4  Smokeless Tobacco Never Used   Counseling given: Not Answered   Outpatient Medications Prior to Visit  Medication Sig Dispense Refill  . acetaminophen (TYLENOL) 500 MG tablet Take 2 tablets (1,000 mg total) by mouth every 6 (six) hours. 30 tablet 0  . albuterol (PROVENTIL HFA;VENTOLIN HFA) 108 (90 BASE) MCG/ACT inhaler Inhale 2 puffs into the lungs 3 (three) times daily. Shortness of Breath     . albuterol (PROVENTIL) (2.5 MG/3ML) 0.083% nebulizer solution Take 3 mLs (2.5 mg total) by nebulization every 6 (six) hours as needed for wheezing or shortness of breath. 75 mL 12  . ibuprofen (ADVIL,MOTRIN) 200 MG tablet Take 400 mg by mouth every 6 (six) hours as needed for moderate pain. Pain     . lidocaine-prilocaine (EMLA) cream Apply to portacath 30 min to 1 hour prior to flush or infusions 30 g 1  . Multiple Vitamin (MULTIVITAMIN WITH MINERALS) TABS Take 1 tablet by mouth daily.    . budesonide-formoterol (SYMBICORT) 160-4.5 MCG/ACT inhaler Inhale 2 puffs into the lungs every 12 (twelve) hours. 1 Inhaler 2  . Tiotropium Bromide Monohydrate (SPIRIVA RESPIMAT) 2.5 MCG/ACT AERS Inhale 2 puffs into the lungs daily. 1 g 0  . oxyCODONE-acetaminophen (PERCOCET/ROXICET) 5-325 MG tablet Take 1 tablet by mouth every 4 (four) hours as needed for severe pain. DO NOT DRIVE 42 tablet 0   No facility-administered medications prior to visit.   Review of Systems  Review of Systems  Respiratory: Positive for cough  and shortness of breath.    Physical Exam  BP 116/70 (BP Location: Left Arm, Patient Position: Sitting, Cuff Size: Normal)   Pulse 87   Temp 98.2 F (36.8 C) (Temporal)   Ht 5\' 4"  (1.626 m)   Wt 131 lb (59.4 kg)   SpO2 98% Comment: on 2L  BMI 22.49 kg/m  Physical Exam Constitutional:      Appearance: Normal appearance.  HENT:     Head: Normocephalic and atraumatic.  Cardiovascular:     Rate and Rhythm: Normal rate and regular rhythm.  Pulmonary:      Effort: Pulmonary effort is normal.     Breath sounds: Normal breath sounds.  Neurological:     Mental Status: She is alert.  Psychiatric:        Mood and Affect: Mood normal.        Behavior: Behavior normal.        Thought Content: Thought content normal.        Judgment: Judgment normal.      Lab Results:  CBC    Component Value Date/Time   WBC 4.9 02/27/2020 1120   WBC 4.3 05/31/2018 0903   RBC 4.10 02/27/2020 1120   HGB 12.9 02/27/2020 1120   HCT 40.0 02/27/2020 1120   PLT 148 (L) 02/27/2020 1120   MCV 97.6 02/27/2020 1120   MCH 31.5 02/27/2020 1120   MCHC 32.3 02/27/2020 1120   RDW 12.7 02/27/2020 1120   LYMPHSABS 0.7 02/27/2020 1120   MONOABS 0.5 02/27/2020 1120   EOSABS 0.0 02/27/2020 1120   BASOSABS 0.0 02/27/2020 1120    BMET    Component Value Date/Time   NA 142 02/27/2020 1120   K 4.2 02/27/2020 1120   CL 106 02/27/2020 1120   CO2 25 02/27/2020 1120   GLUCOSE 92 02/27/2020 1120   BUN 7 (L) 02/27/2020 1120   CREATININE 0.64 02/27/2020 1120   CALCIUM 8.8 (L) 02/27/2020 1120   GFRNONAA >60 02/27/2020 1120   GFRAA >60 02/27/2020 1120    BNP No results found for: BNP  ProBNP No results found for: PROBNP  Imaging: No results found.   Assessment & Plan:   COPD (chronic obstructive pulmonary disease) ([place]) - No benefit from Symbicort/Spiriva  - Change to Trelegy 100 one puff daily  - Adding singulair 10mg  at bedtime for allergies/COPD  - Start Robitussin 57ml every 4-6 hours for cough for cough  - Given Flutter valve and encouraged to use three times a day   - Follow-up in 2-3 weeks with APP        Martyn Ehrich, NP 03/29/2020

## 2020-03-25 NOTE — Progress Notes (Signed)
Patient seen in the office today and instructed on use of Trelegy 100.  Patient expressed understanding and demonstrated technique.  

## 2020-03-25 NOTE — Patient Instructions (Addendum)
Recommendations: Adding singulair 10mg  at bedtime for allergies/COPD  Take Robitussin 66ml every 4-6 hours for cough for 1-2 weeks  Trial trelegy take one puff daily (this replaced Symbicort/Spiriva)  Orders: Flutter valve three times a day    Follow-up: 2-3 weeks with APP    COPD and Physical Activity Chronic obstructive pulmonary disease (COPD) is a long-term (chronic) condition that affects the lungs. COPD is a general term that can be used to describe many different lung problems that cause lung swelling (inflammation) and limit airflow, including chronic bronchitis and emphysema. The main symptom of COPD is shortness of breath, which makes it harder to do even simple tasks. This can also make it harder to exercise and be active. Talk with your health care provider about treatments to help you breathe better and actions you can take to prevent breathing problems during physical activity. What are the benefits of exercising with COPD? Exercising regularly is an important part of a healthy lifestyle. You can still exercise and do physical activities even though you have COPD. Exercise and physical activity improve your shortness of breath by increasing blood flow (circulation). This causes your heart to pump more oxygen through your body. Moderate exercise can improve your:  Oxygen use.  Energy level.  Shortness of breath.  Strength in your breathing muscles.  Heart health.  Sleep.  Self-esteem and feelings of self-worth.  Depression, stress, and anxiety levels. Exercise can benefit everyone with COPD. The severity of your disease may affect how hard you can exercise, especially at first, but everyone can benefit. Talk with your health care provider about how much exercise is safe for you, and which activities and exercises are safe for you. What actions can I take to prevent breathing problems during physical activity?  Sign up for a pulmonary rehabilitation program. This type  of program may include: ? Education about lung diseases. ? Exercise classes that teach you how to exercise and be more active while improving your breathing. This usually involves:  Exercise using your lower extremities, such as a stationary bicycle.  About 30 minutes of exercise, 2 to 5 times per week, for 6 to 12 weeks  Strength training, such as push ups or leg lifts. ? Nutrition education. ? Group classes in which you can talk with others who also have COPD and learn ways to manage stress.  If you use an oxygen tank, you should use it while you exercise. Work with your health care provider to adjust your oxygen for your physical activity. Your resting flow rate is different from your flow rate during physical activity.  While you are exercising: ? Take slow breaths. ? Pace yourself and do not try to go too fast. ? Purse your lips while breathing out. Pursing your lips is similar to a kissing or whistling position. ? If doing exercise that uses a quick burst of effort, such as weight lifting:  Breathe in before starting the exercise.  Breathe out during the hardest part of the exercise (such as raising the weights). Where to find support You can find support for exercising with COPD from:  Your health care provider.  A pulmonary rehabilitation program.  Your local health department or community health programs.  Support groups, online or in-person. Your health care provider may be able to recommend support groups. Where to find more information You can find more information about exercising with COPD from:  American Lung Association: ClassInsider.se.  COPD Foundation: https://www.rivera.net/. Contact a health care provider if:  Your symptoms get worse.  You have chest pain.  You have nausea.  You have a fever.  You have trouble talking or catching your breath.  You want to start a new exercise program or a new activity. Summary  COPD is a general term that can be used to  describe many different lung problems that cause lung swelling (inflammation) and limit airflow. This includes chronic bronchitis and emphysema.  Exercise and physical activity improve your shortness of breath by increasing blood flow (circulation). This causes your heart to provide more oxygen to your body.  Contact your health care provider before starting any exercise program or new activity. Ask your health care provider what exercises and activities are safe for you. This information is not intended to replace advice given to you by your health care provider. Make sure you discuss any questions you have with your health care provider. Document Revised: 03/05/2019 Document Reviewed: 12/06/2017 Elsevier Patient Education  2020 [employer].

## 2020-03-29 NOTE — Assessment & Plan Note (Signed)
-   No benefit from Symbicort/Spiriva  - Change to Trelegy 100 one puff daily  - Adding singulair 10mg  at bedtime for allergies/COPD  - Start Robitussin 24ml every 4-6 hours for cough for cough  - Given Flutter valve and encouraged to use three times a day   - Follow-up in 2-3 weeks with APP

## 2020-03-30 DIAGNOSIS — J449 Chronic obstructive pulmonary disease, unspecified: Secondary | ICD-10-CM | POA: Diagnosis not present

## 2020-03-30 DIAGNOSIS — M542 Cervicalgia: Secondary | ICD-10-CM | POA: Diagnosis not present

## 2020-03-30 DIAGNOSIS — Z87891 Personal history of nicotine dependence: Secondary | ICD-10-CM | POA: Diagnosis not present

## 2020-03-30 DIAGNOSIS — C3411 Malignant neoplasm of upper lobe, right bronchus or lung: Secondary | ICD-10-CM | POA: Diagnosis not present

## 2020-03-30 DIAGNOSIS — N959 Unspecified menopausal and perimenopausal disorder: Secondary | ICD-10-CM | POA: Diagnosis not present

## 2020-03-30 DIAGNOSIS — F419 Anxiety disorder, unspecified: Secondary | ICD-10-CM | POA: Diagnosis not present

## 2020-03-30 DIAGNOSIS — Z0001 Encounter for general adult medical examination with abnormal findings: Secondary | ICD-10-CM | POA: Diagnosis not present

## 2020-03-30 DIAGNOSIS — Z6824 Body mass index (BMI) 24.0-24.9, adult: Secondary | ICD-10-CM | POA: Diagnosis not present

## 2020-04-05 NOTE — Progress Notes (Signed)
PCCM: Thanks for seeing her Darvin Neighbours [facility] 04/05/2020 4:58 PM

## 2020-04-08 ENCOUNTER — Encounter: Payer: Self-pay | Admitting: Primary Care

## 2020-04-08 ENCOUNTER — Ambulatory Visit (INDEPENDENT_AMBULATORY_CARE_PROVIDER_SITE_OTHER): Payer: Medicare Other | Admitting: Primary Care

## 2020-04-08 ENCOUNTER — Other Ambulatory Visit: Payer: Self-pay

## 2020-04-08 DIAGNOSIS — J449 Chronic obstructive pulmonary disease, unspecified: Secondary | ICD-10-CM

## 2020-04-08 MED ORDER — TRELEGY ELLIPTA 100-62.5-25 MCG/INH IN AEPB: 1.0000 | INHALATION_SPRAY | Freq: Every day | RESPIRATORY_TRACT | 6 refills | Status: DC

## 2020-04-08 MED ORDER — ALBUTEROL SULFATE HFA 108 (90 BASE) MCG/ACT IN AERS
2.0000 | INHALATION_SPRAY | Freq: Four times a day (QID) | RESPIRATORY_TRACT | 3 refills | Status: DC | PRN
Start: 2020-04-08 — End: 2020-06-21

## 2020-04-08 NOTE — Patient Instructions (Addendum)
Recommendations: Continue Trelegy 1 puff daily (RX sent to pharmacy- if not covered pleaser notify office, will need prior auth) Sent in prescription for Ventolin HFA two puffs every 4-6 hours for shortness of breath or wheezing  Continue Flutter valve 2-3 times a day followed by coughing exercises   Follow-up: 6 months with Dr. Valeta Harms

## 2020-04-08 NOTE — Progress Notes (Signed)
@Patient  ID: Melissa Golden, female    DOB: [birth year], 74 y.o.   MRN: [id number]  Chief Complaint  Patient presents with  . Follow-up    pt states no issues .pt states trelegy helping with breathing issues    Referring provider: Rory Percy, MD  HPI:  74 year old female, former smoker quit in 2018 (102 pack year hx). 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 Valeta Harms, last seen on 09/24/19. Started on triple therapy, continue Symbicort plus Spiriva respimat. onsider adding azithromycin MWF if needed in the further or changing to all nebulizers.   Previous [facility] pulmonary encounters:  03/25/2020 Patient presents today for 6 month follow-up. She does not report any improvement in her breathing with Symbicort and Spiriva. Using ventolin hfa 3-4 times a day, she feels this is the most effective. She experiences shortness of breath with activities, she has to rest halfway through household chores. She has a chronic non-productive cough. She is on 2L oxygen. She had both covid vaccines.  04/08/2020 Patient presents today for two week follow-up. Feels new addition of Trelegy and Singulair have been working well for her. She was unable to take Robitussin because it made her sleepy. She has a cough but states that it is not nearly as bad or frequent as it was before. She is not currently requiring oxygen, only using it as needed while shopping and at night.    No Known Allergies  Immunization History  Administered Date(s) Administered  . Influenza-Unspecified 08/27/2017, 08/28/2018, 10/08/2019  . Moderna SARS-COVID-2 Vaccination 02/04/2020, 02/14/2020    Past Medical History:  Diagnosis Date  . Asthma   . Cancer ([place]) 2017   colon   chemo tx  . COPD (chronic obstructive pulmonary disease) ([place])    wears 3L home O2  . Ileus following  gastrointestinal surgery ([place]) 09/12/2017   required TPN support  . Lung cancer ([place]) dx'd 10/2016  . Pleural effusion, right   . Shortness of breath    with exertion    Tobacco History: Social History   Tobacco Use  Smoking Status Former Smoker  . Packs/day: 1.50  . Years: 68.00  . Pack years: 102.00  . Types: Cigarettes  . Quit date: 10/2016  . Years since quitting: 3.4  Smokeless Tobacco Never Used   Counseling given: Not Answered   Outpatient Medications Prior to Visit  Medication Sig Dispense Refill  . acetaminophen (TYLENOL) 500 MG tablet Take 2 tablets (1,000 mg total) by mouth every 6 (six) hours. 30 tablet 0  . ibuprofen (ADVIL,MOTRIN) 200 MG tablet Take 400 mg by mouth every 6 (six) hours as needed for moderate pain. Pain     . lidocaine-prilocaine (EMLA) cream Apply to portacath 30 min to 1 hour prior to flush or infusions 30 g 1  . montelukast (SINGULAIR) 10 MG tablet Take 1 tablet (10 mg total) by mouth at bedtime. 30 tablet 11  . Multiple Vitamin (MULTIVITAMIN WITH MINERALS) TABS Take 1 tablet by mouth daily.    Marland Kitchen Respiratory Therapy Supplies (FLUTTER) DEVI 1 Device by Does not apply route as needed. 1 each 0  . Fluticasone-Umeclidin-Vilant (TRELEGY ELLIPTA) 100-62.5-25 MCG/INH AEPB Inhale 1 puff into the lungs daily. 2 each 0  . albuterol (PROVENTIL) (2.5 MG/3ML) 0.083% nebulizer solution Take 3 mLs (2.5 mg total) by nebulization every 6 (six) hours as needed for wheezing or shortness of  breath. (Patient not taking: Reported on 04/08/2020) 75 mL 12  . albuterol (PROVENTIL HFA;VENTOLIN HFA) 108 (90 BASE) MCG/ACT inhaler Inhale 2 puffs into the lungs 3 (three) times daily. Shortness of Breath      No facility-administered medications prior to visit.    Review of Systems  Review of Systems  Constitutional: Negative.   Respiratory: Positive for cough. Negative for chest tightness, shortness of breath and wheezing.   Cardiovascular: Negative.     Physical  Exam  BP 124/82 (BP Location: Left Arm, Cuff Size: Normal)   Pulse 82   Temp 98.4 F (36.9 C) (Temporal)   Ht 5\' 2"  (1.575 m)   Wt 131 lb 3.2 oz (59.5 kg)   SpO2 94%   BMI 24.00 kg/m  Physical Exam Constitutional:      Appearance: Normal appearance.  HENT:     Right Ear: Tympanic membrane normal.     Left Ear: Tympanic membrane normal.     Mouth/Throat:     Mouth: Mucous membranes are moist.     Pharynx: Oropharynx is clear.  Cardiovascular:     Rate and Rhythm: Normal rate and regular rhythm.  Pulmonary:     Effort: Pulmonary effort is normal.     Breath sounds: Normal breath sounds.  Neurological:     General: No focal deficit present.     Mental Status: She is alert and oriented to person, place, and time. Mental status is at baseline.  Psychiatric:        Mood and Affect: Mood normal.        Thought Content: Thought content normal.        Judgment: Judgment normal.      Lab Results:  CBC    Component Value Date/Time   WBC 4.9 02/27/2020 1120   WBC 4.3 05/31/2018 0903   RBC 4.10 02/27/2020 1120   HGB 12.9 02/27/2020 1120   HCT 40.0 02/27/2020 1120   PLT 148 (L) 02/27/2020 1120   MCV 97.6 02/27/2020 1120   MCH 31.5 02/27/2020 1120   MCHC 32.3 02/27/2020 1120   RDW 12.7 02/27/2020 1120   LYMPHSABS 0.7 02/27/2020 1120   MONOABS 0.5 02/27/2020 1120   EOSABS 0.0 02/27/2020 1120   BASOSABS 0.0 02/27/2020 1120    BMET    Component Value Date/Time   NA 142 02/27/2020 1120   K 4.2 02/27/2020 1120   CL 106 02/27/2020 1120   CO2 25 02/27/2020 1120   GLUCOSE 92 02/27/2020 1120   BUN 7 (L) 02/27/2020 1120   CREATININE 0.64 02/27/2020 1120   CALCIUM 8.8 (L) 02/27/2020 1120   GFRNONAA >60 02/27/2020 1120   GFRAA >60 02/27/2020 1120    BNP No results found for: BNP  ProBNP No results found for: PROBNP  Imaging: No results found.   Assessment & Plan:   COPD (chronic obstructive pulmonary disease) ([place]) - Patient was started on Trelegy and  Singulair at last visit in April. Since then she has notice an improvement in her cough and is not requiring her oxygen 24/7  - Continue Trelegy 100 one puff daily (sending in prescription to pharmacy on file, if not covered patient to notify office and we will need to process prior auth) - Continue Ventolin HFA two puffs every 4-6 hours for shortness of breath or wheezing  - Continue Singulair 10mg  at bedtime  - Continue Flutter valve 2-3 times a day followed by coughing exercises  - Follow-up 6 months with Dr. Valeta Harms  Martyn Ehrich, NP 04/11/2020

## 2020-04-11 NOTE — Assessment & Plan Note (Addendum)
-   Patient was started on Trelegy and Singulair at last visit in April. Since then she has notice an improvement in her cough and is not requiring her oxygen 24/7  - Continue Trelegy 100 one puff daily (sending in prescription to pharmacy on file, if not covered patient to notify office and we will need to process prior auth) - Continue Ventolin HFA two puffs every 4-6 hours for shortness of breath or wheezing  - Continue Singulair 10mg  at bedtime  - Continue Flutter valve 2-3 times a day followed by coughing exercises  - Follow-up 6 months with Dr. Valeta Harms

## 2020-04-12 ENCOUNTER — Telehealth: Payer: Self-pay | Admitting: Primary Care

## 2020-04-12 NOTE — Telephone Encounter (Signed)
Called spoke with patient, let her know the Rx was sent in by Geraldo Pitter on 04/08/20 the day of her appointment. Verified pharmacy, she said that was correct. Patient is going to call pharmacy and follow up  Nothing further needed at this time.

## 2020-04-13 NOTE — Telephone Encounter (Signed)
PA has been completed via Cover My Meds. Will keep encounter open so we an follow up on initiated PA.  Medication name and strength: Trelegy 100 Provider: [place]: [place] Patient insurance ID: [id number] Phone: [phone] Fax: [phone]  Was the PA started on CMM?  Yes If yes, please enter the Key: [id number] Timeframe for approval/denial: [employer] is reviewing your PA request. Typically an electronic response will be received within 72 hours. To check for an update later, open this request from your dashboard.

## 2020-04-14 ENCOUNTER — Telehealth: Payer: Self-pay | Admitting: Primary Care

## 2020-04-14 NOTE — Telephone Encounter (Signed)
Medication name and strength: Trelegy 100 PA approved/denied: DENIED If denied, reason for denial:   Trelegy Ellipta is denied because it is not on your plan's Drug List (formulary). Medication authorization requires the following: (1) You need to try three (3) of these covered drugs: (a) Anoro Ellipta. (b) Bevespi Aerosphere. (c) Breo Ellipta. (d) Serevent Diskus. (e) Stiolto Respimat. (2) OR your doctor needs to give [place] specific medical reasons why three (3) of the covered drug(s) are not appropriate for you. Reviewed by: MMR, Pharm.D  Please advise on if you would like to switch patient to one of the above inhalers.  Looks like she has only tried Qvar, Pulmicort 180, Symbicort 160, Fluticasone-Salmeterol 232-14 MCG/ACT AEPB, Spiriva 2.5

## 2020-04-14 NOTE — Telephone Encounter (Signed)
Whatever you think Melissa Golden. Would be nice to keep her on triple therapy.  [facility] 04/14/2020 2:07 PM

## 2020-04-14 NOTE — Progress Notes (Signed)
PCCM: thanks for seeing her Garner Nash, DO [facility] 04/14/2020 6:19 PM

## 2020-04-14 NOTE — Telephone Encounter (Signed)
Called and spoke with patient letting her know that PA for Trelegy was denied. In previous message on 5/17 I listed inhalers that she needs to try/fail first before they will approve it. Informed patient that once we got a response back with inhaler suggestions we would give her a call back. Patient expressed understanding. I will close this encounter and wait on response on other one. Nothing further needed at this time on this encounter.

## 2020-04-16 NOTE — Telephone Encounter (Signed)
Pt returning a ppone call. Pt can be reached at [phone].

## 2020-04-16 NOTE — Telephone Encounter (Signed)
Called and spoke with patient letting her know that we are waiting on a response from [facility] in regards to which inhaler she would like for patient. She expressed understanding will touch base with patient once we have a response.

## 2020-04-19 ENCOUNTER — Other Ambulatory Visit: Payer: Self-pay

## 2020-04-19 ENCOUNTER — Inpatient Hospital Stay: Payer: Medicare Other

## 2020-04-19 ENCOUNTER — Inpatient Hospital Stay: Payer: Medicare Other | Attending: Oncology

## 2020-04-19 DIAGNOSIS — C3411 Malignant neoplasm of upper lobe, right bronchus or lung: Secondary | ICD-10-CM

## 2020-04-19 DIAGNOSIS — Z85118 Personal history of other malignant neoplasm of bronchus and lung: Secondary | ICD-10-CM | POA: Diagnosis not present

## 2020-04-19 DIAGNOSIS — Z95828 Presence of other vascular implants and grafts: Secondary | ICD-10-CM

## 2020-04-19 DIAGNOSIS — Z923 Personal history of irradiation: Secondary | ICD-10-CM | POA: Insufficient documentation

## 2020-04-19 DIAGNOSIS — J449 Chronic obstructive pulmonary disease, unspecified: Secondary | ICD-10-CM | POA: Diagnosis not present

## 2020-04-19 DIAGNOSIS — Z79899 Other long term (current) drug therapy: Secondary | ICD-10-CM | POA: Insufficient documentation

## 2020-04-19 DIAGNOSIS — Z85038 Personal history of other malignant neoplasm of large intestine: Secondary | ICD-10-CM | POA: Insufficient documentation

## 2020-04-19 DIAGNOSIS — C181 Malignant neoplasm of appendix: Secondary | ICD-10-CM

## 2020-04-19 DIAGNOSIS — Z9221 Personal history of antineoplastic chemotherapy: Secondary | ICD-10-CM | POA: Insufficient documentation

## 2020-04-19 LAB — CEA (IN HOUSE-CHCC): CEA (CHCC-In House): 2.44 ng/mL (ref 0.00–5.00)

## 2020-04-19 MED ORDER — HEPARIN SOD (PORK) LOCK FLUSH 100 UNIT/ML IV SOLN
500.0000 [IU] | Freq: Once | INTRAVENOUS | Status: AC
  Administered 2020-04-19: 500 [IU]
  Filled 2020-04-19: qty 5

## 2020-04-19 MED ORDER — SODIUM CHLORIDE 0.9% FLUSH
10.0000 mL | Freq: Once | INTRAVENOUS | Status: AC
  Administered 2020-04-19: 10 mL
  Filled 2020-04-19: qty 10

## 2020-04-21 NOTE — Telephone Encounter (Signed)
Please do an appeal for Trelegy 100. If she need another 2 week sample we can give her one if we have on hand

## 2020-04-21 NOTE — Telephone Encounter (Addendum)
  She has moderate-severe COPD with reversibility indicating underlying asthma. She needs to be on triple therapy, would prefer TRELEGY as we can step her up to 200 dose if needed for mixed disease. She had no benefit from Qvar, Pulmicort, Symbicort 160 or Spiriva Respimat 2.5.   Other option is Breztri two puffs twice daily if covered

## 2020-04-21 NOTE — Telephone Encounter (Signed)
Beth, I checked the samples closet and we do not have Trelegy 100. We have Trelegy 200. Would you be ok with [place] giving her a sample of this instead?

## 2020-04-21 NOTE — Telephone Encounter (Signed)
Yes

## 2020-04-21 NOTE — Telephone Encounter (Signed)
Breztri nor Trelegy are covered by [employer].  We could do an appeal for the Trelegy to see if we might be able to get the decision changed from denial to approval.  Beth, please advise and route what you want [place] to do back to triage. If you want the appeal to happen, we could write a letter and fax to the appeals dept. If so, please let us know what all you want to say in a letter.

## 2020-04-22 NOTE — Telephone Encounter (Signed)
Routing to [employer] to assist with appeal.

## 2020-04-22 NOTE — Telephone Encounter (Addendum)
Attempted to call pt to ask if she needs [place] to give her a sample of Trelegy but unable to reach. left message for pt to return call.  Rachael, is there a way you could help [place] out with either doing the appeal or seeing if we might be able to re do the PA with the message stated by [facility] about why pt needs to be on Trelegy 100?

## 2020-04-27 NOTE — Telephone Encounter (Signed)
ATC the pt, NA had to [facility]

## 2020-04-27 NOTE — Telephone Encounter (Signed)
We do not handle PA, PAP, or appeals for non-specialty.  You can try for an appeal for triple therapy but not sure it would be approved as I do not see any contraindications to other inhalers.  Also looked at CBC to see if we could use eosinophil count as support for steroids but it was normal.  You may be able to argue Breztri as you are concerned she is not a good candidate for DPI due to decreased inspiratory flow. Thanks! Museum/gallery conservator

## 2020-04-27 NOTE — Telephone Encounter (Signed)
Has she picked up the sample of Trelegy? Lets try Breztri and process a PA for that. Agree with Amber we can use decreased inspiratory flow. She does not have eos on labs but she does have reversibility on PFTs indicating mixed disease. May want to check respiratory allergy panel at next visit.

## 2020-04-28 MED ORDER — TRELEGY ELLIPTA 100-62.5-25 MCG/INH IN AEPB: 1.0000 | INHALATION_SPRAY | Freq: Every day | RESPIRATORY_TRACT | 0 refills | Status: DC

## 2020-04-28 NOTE — Telephone Encounter (Signed)
PA for Judithann Sauger has been started. Key is [id number]. Determination will take up to 72 hours.

## 2020-04-28 NOTE — Telephone Encounter (Addendum)
Spoke with pt, she didn't know of any samples that were left for her. I advised her to come by the office and I would leave her a sample of Trelegy up front. Pt understood. PA for Judithann Sauger will be started with Cherina this afternoon.

## 2020-04-28 NOTE — Telephone Encounter (Signed)
PA for Judithann Sauger was denied as well. [employer] will only pay for the following: Anoro, Bevespi, Breo, Serevent or Stiolto. There are no triple therapies on patient's formulary at this time.

## 2020-04-29 NOTE — Telephone Encounter (Signed)
Patient is returning phone call. Patient phone number is [phone].

## 2020-04-29 NOTE — Telephone Encounter (Signed)
That's frustrating. Ok go back to [employer] + Symbicort 160 for the time being. Other option I would recommend trying would be Stiolto+ ICS inhaler/nebulizer. FU with Dr. Valeta Harms next available

## 2020-04-29 NOTE — Telephone Encounter (Signed)
LMTCB x 1 

## 2020-04-30 NOTE — Telephone Encounter (Signed)
I spoke with pt, she states she is ok with going back to Spiriva and symbicort after she finishes the sample of Trelegy.   When I ordered the symbicort a message came up that they would cover symbicort 80 not 160. Should we change the dose? Please advise.

## 2020-04-30 NOTE — Telephone Encounter (Signed)
LMTCB x2 for pt 

## 2020-05-03 MED ORDER — BUDESONIDE-FORMOTEROL FUMARATE 160-4.5 MCG/ACT IN AERO
2.0000 | INHALATION_SPRAY | Freq: Two times a day (BID) | RESPIRATORY_TRACT | 3 refills | Status: DC
Start: 2020-05-03 — End: 2020-10-04

## 2020-05-03 MED ORDER — SPIRIVA RESPIMAT 2.5 MCG/ACT IN AERS
2.0000 | INHALATION_SPRAY | Freq: Every day | RESPIRATORY_TRACT | 3 refills | Status: DC
Start: 2020-05-03 — End: 2020-10-01

## 2020-05-03 NOTE — Telephone Encounter (Signed)
No I would keep 160

## 2020-05-03 NOTE — Telephone Encounter (Signed)
Rx for both Spiriva and Symbicort have been sent to pt's preferred pharmacy.  Attempted to call pt to let her know this had been done but unable to reach. Left  Message for her to return call.

## 2020-05-04 NOTE — Telephone Encounter (Signed)
Patient is returning phone call. Patient phone number is [phone].

## 2020-05-04 NOTE — Telephone Encounter (Signed)
LMTCB x2 for pt 

## 2020-05-04 NOTE — Telephone Encounter (Signed)
Spoke with pt. She is aware that these medications have been sent in. Nothing further was needed.

## 2020-06-11 ENCOUNTER — Other Ambulatory Visit: Payer: Self-pay

## 2020-06-11 DIAGNOSIS — C3411 Malignant neoplasm of upper lobe, right bronchus or lung: Secondary | ICD-10-CM

## 2020-06-14 ENCOUNTER — Inpatient Hospital Stay: Payer: Medicare Other

## 2020-06-14 ENCOUNTER — Inpatient Hospital Stay: Payer: Medicare Other | Admitting: Nurse Practitioner

## 2020-06-17 ENCOUNTER — Ambulatory Visit (HOSPITAL_COMMUNITY)
Admission: RE | Admit: 2020-06-17 | Discharge: 2020-06-17 | Disposition: A | Discharge status: Home / Self Care | Payer: Medicare Other | Source: Ambulatory Visit | Attending: Oncology | Admitting: Oncology

## 2020-06-17 ENCOUNTER — Other Ambulatory Visit: Payer: Self-pay

## 2020-06-17 ENCOUNTER — Inpatient Hospital Stay: Payer: Medicare Other

## 2020-06-17 ENCOUNTER — Inpatient Hospital Stay: Payer: Medicare Other | Attending: Oncology

## 2020-06-17 DIAGNOSIS — Z85038 Personal history of other malignant neoplasm of large intestine: Secondary | ICD-10-CM | POA: Diagnosis not present

## 2020-06-17 DIAGNOSIS — Z79899 Other long term (current) drug therapy: Secondary | ICD-10-CM | POA: Insufficient documentation

## 2020-06-17 DIAGNOSIS — Z85118 Personal history of other malignant neoplasm of bronchus and lung: Secondary | ICD-10-CM | POA: Diagnosis not present

## 2020-06-17 DIAGNOSIS — Z95828 Presence of other vascular implants and grafts: Secondary | ICD-10-CM

## 2020-06-17 DIAGNOSIS — C3490 Malignant neoplasm of unspecified part of unspecified bronchus or lung: Secondary | ICD-10-CM | POA: Diagnosis not present

## 2020-06-17 DIAGNOSIS — J449 Chronic obstructive pulmonary disease, unspecified: Secondary | ICD-10-CM | POA: Insufficient documentation

## 2020-06-17 DIAGNOSIS — C3411 Malignant neoplasm of upper lobe, right bronchus or lung: Secondary | ICD-10-CM | POA: Diagnosis not present

## 2020-06-17 DIAGNOSIS — Z923 Personal history of irradiation: Secondary | ICD-10-CM | POA: Insufficient documentation

## 2020-06-17 LAB — CBC WITH DIFFERENTIAL (CANCER CENTER ONLY)
Abs Immature Granulocytes: 0.01 10*3/uL (ref 0.00–0.07)
Basophils Absolute: 0 10*3/uL (ref 0.0–0.1)
Basophils Relative: 1 %
Eosinophils Absolute: 0.1 10*3/uL (ref 0.0–0.5)
Eosinophils Relative: 1 %
HCT: 40.3 % (ref 36.0–46.0)
Hemoglobin: 13.2 g/dL (ref 12.0–15.0)
Immature Granulocytes: 0 %
Lymphocytes Relative: 12 %
Lymphs Abs: 0.7 10*3/uL (ref 0.7–4.0)
MCH: 31.2 pg (ref 26.0–34.0)
MCHC: 32.8 g/dL (ref 30.0–36.0)
MCV: 95.3 fL (ref 80.0–100.0)
Monocytes Absolute: 0.7 10*3/uL (ref 0.1–1.0)
Monocytes Relative: 11 %
Neutro Abs: 4.4 10*3/uL (ref 1.7–7.7)
Neutrophils Relative %: 75 %
Platelet Count: 150 10*3/uL (ref 150–400)
RBC: 4.23 MIL/uL (ref 3.87–5.11)
RDW: 12.3 % (ref 11.5–15.5)
WBC Count: 5.8 10*3/uL (ref 4.0–10.5)
nRBC: 0 % (ref 0.0–0.2)

## 2020-06-17 LAB — CMP (CANCER CENTER ONLY)
ALT: 9 U/L (ref 0–44)
AST: 15 U/L (ref 15–41)
Albumin: 3.6 g/dL (ref 3.5–5.0)
Alkaline Phosphatase: 91 U/L (ref 38–126)
Anion gap: 12 (ref 5–15)
BUN: 7 mg/dL — ABNORMAL LOW (ref 8–23)
CO2: 24 mmol/L (ref 22–32)
Calcium: 9.4 mg/dL (ref 8.9–10.3)
Chloride: 104 mmol/L (ref 98–111)
Creatinine: 0.64 mg/dL (ref 0.44–1.00)
GFR, Est AFR Am: >60 mL/min (ref >60)
GFR, Estimated: >60 mL/min (ref >60)
Glucose, Bld: 97 mg/dL (ref 70–99)
Potassium: 4.1 mmol/L (ref 3.5–5.1)
Sodium: 140 mmol/L (ref 135–145)
Total Bilirubin: 0.5 mg/dL (ref 0.3–1.2)
Total Protein: 7.1 g/dL (ref 6.5–8.1)

## 2020-06-17 MED ORDER — HEPARIN SOD (PORK) LOCK FLUSH 100 UNIT/ML IV SOLN
INTRAVENOUS | Status: AC
  Filled 2020-06-17: qty 5

## 2020-06-17 MED ORDER — SODIUM CHLORIDE 0.9% FLUSH
10.0000 mL | Freq: Once | INTRAVENOUS | Status: AC
  Administered 2020-06-17: 10 mL
  Filled 2020-06-17: qty 10

## 2020-06-17 NOTE — Progress Notes (Signed)
Patient left accessed for CT

## 2020-06-21 ENCOUNTER — Inpatient Hospital Stay (HOSPITAL_BASED_OUTPATIENT_CLINIC_OR_DEPARTMENT_OTHER): Payer: Medicare Other | Admitting: Oncology

## 2020-06-21 ENCOUNTER — Other Ambulatory Visit: Payer: Self-pay

## 2020-06-21 ENCOUNTER — Telehealth: Payer: Self-pay | Admitting: Oncology

## 2020-06-21 VITALS — BP 120/63 | HR 100 | Temp 97.5°F | Resp 16 | Wt 129.9 lb

## 2020-06-21 DIAGNOSIS — C3411 Malignant neoplasm of upper lobe, right bronchus or lung: Secondary | ICD-10-CM | POA: Diagnosis not present

## 2020-06-21 DIAGNOSIS — Z79899 Other long term (current) drug therapy: Secondary | ICD-10-CM | POA: Diagnosis not present

## 2020-06-21 DIAGNOSIS — Z85118 Personal history of other malignant neoplasm of bronchus and lung: Secondary | ICD-10-CM | POA: Diagnosis not present

## 2020-06-21 DIAGNOSIS — J449 Chronic obstructive pulmonary disease, unspecified: Secondary | ICD-10-CM | POA: Diagnosis not present

## 2020-06-21 DIAGNOSIS — Z85038 Personal history of other malignant neoplasm of large intestine: Secondary | ICD-10-CM | POA: Diagnosis not present

## 2020-06-21 DIAGNOSIS — Z923 Personal history of irradiation: Secondary | ICD-10-CM | POA: Diagnosis not present

## 2020-06-21 NOTE — Progress Notes (Signed)
[place] OFFICE PROGRESS NOTE   Diagnosis: Non-small cell lung cancer  INTERVAL HISTORY:   Melissa Golden returns as scheduled.  She has stable dyspnea and cough.  Good appetite.  No new complaint.  Objective:  Vital signs in last 24 hours:  Blood pressure (!) 120/63, pulse 100, temperature (!) 97.5 F (36.4 C), temperature source Temporal, resp. rate 16, weight 129 lb 14.4 oz (58.9 kg), SpO2 95 %.    Lymphatics: No cervical, supraclavicular, axillary, or inguinal nodes Resp: Distant breath sounds, no respiratory distress Cardio: Regular rate and rhythm GI: No hepatosplenomegaly, no mass, nontender, no apparent ascites Vascular: No leg edema    Portacath/PICC-without erythema  Lab Results:  Lab Results  Component Value Date   WBC 5.8 06/17/2020   HGB 13.2 06/17/2020   HCT 40.3 06/17/2020   MCV 95.3 06/17/2020   PLT 150 06/17/2020   NEUTROABS 4.4 06/17/2020    CMP  Lab Results  Component Value Date   NA 140 06/17/2020   K 4.1 06/17/2020   CL 104 06/17/2020   CO2 24 06/17/2020   GLUCOSE 97 06/17/2020   BUN 7 (L) 06/17/2020   CREATININE 0.64 06/17/2020   CALCIUM 9.4 06/17/2020   PROT 7.1 06/17/2020   ALBUMIN 3.6 06/17/2020   AST 15 06/17/2020   ALT 9 06/17/2020   ALKPHOS 91 06/17/2020   BILITOT 0.5 06/17/2020   GFRNONAA >60 06/17/2020   GFRAA >60 06/17/2020    Lab Results  Component Value Date   CEA1 2.44 04/19/2020    Medications: I have reviewed the patient's current medications.   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  Status postlaparoscopic appendectomy 09/12/2017. Postoperative course complicated by an ileus requiring TPN.   Final pathology of the appendix-5 cm adenocarcinoma/goblet cell carcinoid. The tumor extended through the muscularis propria into  subserosal soft tissue and mesoappendix and focally to the serosal surface. Lymphovascular space invasion identified. Perineural invasion identified. Associated acute appendicitis with perforation.  10/15/2017 CEA 1.  11/22/2017 chromogranin A-3  Status postlaparoscopic right hemicolectomy by Dr. Janace Litten 12/07/2017. No carcinomatosis or liver metastases were seen.   Final pathologyshowed metastatic adenocarcinoma/goblet cell carcinoid in 3 of 15 pericolic lymph nodes; maximum size of largest metastasis 0.2 cm; positive for extracapsular extension. Benign terminal ileum with fibrous serosal adhesions, negative for dysplasia or carcinoma; surgically absent appendix; appendectomy site with postsurgical changes and fibrous serosal adhesions, negative for carcinoma; proximal and distal mucosal margins negative for dysplasia or carcinoma; mesenteric resection margin negative for carcinoma.  Cycle 1 adjuvant Xeloda 01/07/2018  Cycle 2 adjuvant Xeloda 01/28/2018 2. Non-small cell lung cancer  PET [id number]/[id number] hypermetabolic right upper lobe pulmonary nodules increased in size and metabolic activity. No mediastinal nodal metastasis. 4 mm right upper lobe nodule with metabolic activity, SUV 3.6. A third right upper lobe nodule measures 4 mm and has faint metabolic activity. No clear evidence of malignancy in the abdomen or pelvis. Mild activity along the ventral abdominal surgical scar. Scattered activity in small bowel without focal lesion on CT.  CT biopsy right upper lobe nodule 11/02/2017 showed non-small cell carcinoma; malignant cells positive for TTF-1 and negative for p63. PD-L1 95%.  Chest CT 01/21/2018-new mediastinal adenopathy seen in the prevascular and right paratracheal stations. Largest lymph node is seen in the prevascular space measuring 2.0 cm, previously 6 mm. No hilar or axillary adenopathy. Right lung nodules  are stable.  Biopsy of a right paratracheal  lymph node on 02/04/2018-metastatic adenocarcinoma consistent with a lung primary  Chest radiation started 03/11/2018  Week 1 Taxol/carboplatin 03/13/2018  Week 2 Taxol/carboplatin 03/20/2018  Week 3 Taxol/carboplatin 03/27/2018  Week4Taxol/carboplatin 04/03/2018  Week 5 Taxol/carboplatin 04/10/2018  Week 6 Taxol/carboplatin 04/19/2018  Chest radiation completed 04/25/2018  CT chest 05/23/2018-interval decrease in size of mediastinal adenopathy. Near complete resolution of previously visualized right pleural effusion. The right lung nodules have decreased in size. Other nodules are stable.  Cycle 1 durvalumab 05/23/2018  Cycle 2durvalumab 06/06/2018  Cycle 3 durvalumab 06/20/2018  Cycle 4 durvalumab 07/04/2018  Cycle 5 durvalumab 07/19/2018  Cycle 6 durvalumab 08/01/2018  Cycle 7 durvalumab 08/15/2018  CT chest 09/03/2018-collapse/consolidation with bronchiectasis and surrounding groundglass involving primarilytheright upper lobe, findings most indicative of recent radiation therapy. Previously measured right upper lobe nodule obscured. New areas of nodularity in the right lower lobe, likely infectious or inflammatory.  Cycle 8durvalumab10/07/2018  Cycle 9 durvalumab 09/18/2018  Cycle 10 durvalumab 10/02/2018  Cycle 11 durvalumab 10/16/2018   Cycle 12 durvalumab 11/13/2018  Cycle 13 durvalumab 11/30/2018  Cycle 14 durvalumab1/15/2020  CT [facility] [facility]no mediastinal, axillary or hilar adenopathy. Centrilobular emphysema. Right apical volume loss with bronchiectasis traversing the area of atelectatic lung. Elevation of the adjacent minor fissure due to volume loss. Subpleural left upper lobe opacity, nonspecific, measuring 8.7 mm. Compression fractures T5 and T7.  Cycle 15 durvalumab 12/25/2018  Cycle 16 durvalumab 01/29/2019  Cycle 17 durvalumab 02/12/2019  Cycle 18 durvalumab 02/26/2019  Cycle 19 durvalumab 03/12/2019  Cycle 20 durvalumab  03/26/2019  Cycle 21 durvalumab 04/16/2019  Cycle 22 durvalumab 04/30/2019  CT chest 05/12/2019-right lung radiation fibrosis, no evidence of local tumor recurrence, no evidence of metastatic disease in the chest, stable pulmonary nodules  Cycle 23 durvalumab 05/14/2019  CT chest 10/13/2019-stable radiation changes in the right lung, new nodules in the right lower lobe, left lower lobe, and lingula-favored to represent an inflammatory process  CT chest 12/30/2019-no findings to suggest recurrent or metastatic disease, subtle groundglass at the site of previous nodularity in the right lung base almost completely resolved  CT chest 06/17/2020-6 mm left lower lobe nodule-more conspicuous, stable compression deformities at T5 and T7   3. COPD  4.VATS drainage of a loculated right pleural effusion 03/01/2018  5.Port-A-Cath placement7/03/2018  6. Severe mid back pain 10/30/2018-CT thoracic spine-T5 compression fracture new since 09/03/2018-appearance consistent with an osteoporotic fracture. Pain improved.Increased back pain January 2020. CT scan 12/16/2018-new compression fractures T5 and T7. T5 vertebral body somewhat sclerotic in appearance.MRI thoracic spine 01/08/2019-acute compression fractures involving the T5 and T7 vertebral bodies stable relative to recent CT with up to 50% height loss at T5 and 25% height loss at T7. No associated bony retropulsion. Benign/mechanical features by MRI with no appreciable underlying pathologic lesion. No other evidence for metastatic disease within the thoracic spine.  Disposition: Ms. Schwenn remains in clinical remission from lung cancer and appendix cancer.  I reviewed the chest CT images with her.  The left lower lobe nodule is more conspicuous than on the past few CTs.  She will be scheduled for a Port-A-Cath flush in 8 weeks.  She will return for an office visit and repeat chest CT during the week of 10/04/2020.  Betsy Coder,  MD  06/21/2020  12:23 PM

## 2020-06-21 NOTE — Telephone Encounter (Signed)
Scheduled per 7/26 los. Printed avs and calendar for pt.

## 2020-07-22 DIAGNOSIS — Z87891 Personal history of nicotine dependence: Secondary | ICD-10-CM | POA: Diagnosis not present

## 2020-07-22 DIAGNOSIS — N959 Unspecified menopausal and perimenopausal disorder: Secondary | ICD-10-CM | POA: Diagnosis not present

## 2020-07-22 DIAGNOSIS — M542 Cervicalgia: Secondary | ICD-10-CM | POA: Diagnosis not present

## 2020-07-22 DIAGNOSIS — F419 Anxiety disorder, unspecified: Secondary | ICD-10-CM | POA: Diagnosis not present

## 2020-07-22 DIAGNOSIS — Z6823 Body mass index (BMI) 23.0-23.9, adult: Secondary | ICD-10-CM | POA: Diagnosis not present

## 2020-07-22 DIAGNOSIS — C3411 Malignant neoplasm of upper lobe, right bronchus or lung: Secondary | ICD-10-CM | POA: Diagnosis not present

## 2020-07-22 DIAGNOSIS — J449 Chronic obstructive pulmonary disease, unspecified: Secondary | ICD-10-CM | POA: Diagnosis not present

## 2020-08-12 ENCOUNTER — Other Ambulatory Visit: Payer: Self-pay

## 2020-08-12 ENCOUNTER — Inpatient Hospital Stay: Payer: Medicare Other | Attending: Oncology

## 2020-08-12 DIAGNOSIS — C3411 Malignant neoplasm of upper lobe, right bronchus or lung: Secondary | ICD-10-CM

## 2020-08-12 DIAGNOSIS — Z452 Encounter for adjustment and management of vascular access device: Secondary | ICD-10-CM | POA: Insufficient documentation

## 2020-08-12 DIAGNOSIS — Z85118 Personal history of other malignant neoplasm of bronchus and lung: Secondary | ICD-10-CM | POA: Diagnosis not present

## 2020-08-12 DIAGNOSIS — Z95828 Presence of other vascular implants and grafts: Secondary | ICD-10-CM

## 2020-08-12 MED ORDER — SODIUM CHLORIDE 0.9% FLUSH
10.0000 mL | Freq: Once | INTRAVENOUS | Status: AC
  Administered 2020-08-12: 10 mL
  Filled 2020-08-12: qty 10

## 2020-08-12 MED ORDER — HEPARIN SOD (PORK) LOCK FLUSH 100 UNIT/ML IV SOLN
500.0000 [IU] | Freq: Once | INTRAVENOUS | Status: AC
  Administered 2020-08-12: 500 [IU]
  Filled 2020-08-12: qty 5

## 2020-08-12 NOTE — Patient Instructions (Signed)

## 2020-10-01 ENCOUNTER — Other Ambulatory Visit: Payer: Self-pay | Admitting: Primary Care

## 2020-10-02 ENCOUNTER — Other Ambulatory Visit: Payer: Self-pay | Admitting: Primary Care

## 2020-10-04 ENCOUNTER — Encounter (HOSPITAL_COMMUNITY): Payer: Self-pay

## 2020-10-04 ENCOUNTER — Other Ambulatory Visit: Payer: Self-pay

## 2020-10-04 ENCOUNTER — Ambulatory Visit (HOSPITAL_COMMUNITY)
Admission: RE | Admit: 2020-10-04 | Discharge: 2020-10-04 | Disposition: A | Discharge status: Home / Self Care | Payer: Medicare Other | Source: Ambulatory Visit | Attending: Oncology | Admitting: Oncology

## 2020-10-04 DIAGNOSIS — C3411 Malignant neoplasm of upper lobe, right bronchus or lung: Secondary | ICD-10-CM | POA: Diagnosis not present

## 2020-10-04 DIAGNOSIS — S22050A Wedge compression fracture of T5-T6 vertebra, initial encounter for closed fracture: Secondary | ICD-10-CM | POA: Diagnosis not present

## 2020-10-04 DIAGNOSIS — S22060A Wedge compression fracture of T7-T8 vertebra, initial encounter for closed fracture: Secondary | ICD-10-CM | POA: Diagnosis not present

## 2020-10-04 DIAGNOSIS — J90 Pleural effusion, not elsewhere classified: Secondary | ICD-10-CM | POA: Diagnosis not present

## 2020-10-04 DIAGNOSIS — R059 Cough, unspecified: Secondary | ICD-10-CM | POA: Diagnosis not present

## 2020-10-06 ENCOUNTER — Encounter: Payer: Self-pay | Admitting: Nurse Practitioner

## 2020-10-06 ENCOUNTER — Telehealth: Payer: Self-pay | Admitting: Nurse Practitioner

## 2020-10-06 ENCOUNTER — Other Ambulatory Visit: Payer: Self-pay

## 2020-10-06 ENCOUNTER — Inpatient Hospital Stay: Payer: Medicare Other | Attending: Oncology | Admitting: Nurse Practitioner

## 2020-10-06 VITALS — BP 112/65 | HR 87 | Temp 98.5°F | Resp 16 | Ht 62.0 in | Wt 130.7 lb

## 2020-10-06 DIAGNOSIS — Z85038 Personal history of other malignant neoplasm of large intestine: Secondary | ICD-10-CM | POA: Insufficient documentation

## 2020-10-06 DIAGNOSIS — Z923 Personal history of irradiation: Secondary | ICD-10-CM | POA: Insufficient documentation

## 2020-10-06 DIAGNOSIS — C3411 Malignant neoplasm of upper lobe, right bronchus or lung: Secondary | ICD-10-CM | POA: Diagnosis not present

## 2020-10-06 DIAGNOSIS — Z85118 Personal history of other malignant neoplasm of bronchus and lung: Secondary | ICD-10-CM | POA: Diagnosis not present

## 2020-10-06 NOTE — Telephone Encounter (Signed)
Scheduled per 11/10 los. Printed avs and calendar for pt.

## 2020-10-06 NOTE — Progress Notes (Addendum)
Melissa Golden OFFICE PROGRESS NOTE   Diagnosis: Non-small cell lung cancer  INTERVAL HISTORY:   [name] returns as scheduled.  She overall feels well.  No change in baseline dyspnea and cough.  She continues supplemental oxygen.  No change in bowel habits.  No blood or pain with bowel movements.  She reports a good appetite.  Objective:  Vital signs in last 24 hours:  Blood pressure 112/65, pulse 87, temperature 98.5 F (36.9 C), temperature source Tympanic, resp. rate 16, height 5' 2" (1.575 m), weight 130 lb 11.2 oz (59.3 kg), SpO2 100 %.    HEENT: Neck without mass. Lymphatics: No palpable cervical, supraclavicular or axillary lymph nodes. Resp: Distant breath sounds.  No respiratory distress. Cardio: Distant heart sounds.  Regular. GI: Abdomen soft and nontender.  No hepatosplenomegaly. Vascular: No leg edema. Port-A-Cath without erythema.  Lab Results:  Lab Results  Component Value Date   WBC 5.8 06/17/2020   HGB 13.2 06/17/2020   HCT 40.3 06/17/2020   MCV 95.3 06/17/2020   PLT 150 06/17/2020   NEUTROABS 4.4 06/17/2020    Imaging:  No results found.  Medications: I have reviewed the patient's current medications.  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  Status postlaparoscopic appendectomy 09/12/2017. Postoperative course complicated by an ileus requiring TPN.   Final pathology of the appendix-5 cm adenocarcinoma/goblet cell carcinoid. The tumor extended through the muscularis propria into subserosal soft tissue and mesoappendix and focally to the serosal surface. Lymphovascular space invasion identified. Perineural invasion identified. Associated acute appendicitis with perforation.  10/15/2017 CEA 1.  11/22/2017 chromogranin A-3  Status postlaparoscopic right  hemicolectomy by Dr. Janace Litten 12/07/2017. No carcinomatosis or liver metastases were seen.   Final pathologyshowed metastatic adenocarcinoma/goblet cell carcinoid in 3 of 15 pericolic lymph nodes; maximum size of largest metastasis 0.2 cm; positive for extracapsular extension. Benign terminal ileum with fibrous serosal adhesions, negative for dysplasia or carcinoma; surgically absent appendix; appendectomy site with postsurgical changes and fibrous serosal adhesions, negative for carcinoma; proximal and distal mucosal margins negative for dysplasia or carcinoma; mesenteric resection margin negative for carcinoma.  Cycle 1 adjuvant Xeloda 01/07/2018  Cycle 2 adjuvant Xeloda 01/28/2018 2. Non-small cell lung cancer  PET [id number] hypermetabolic right upper lobe pulmonary nodules increased in size and metabolic activity. No mediastinal nodal metastasis. 4 mm right upper lobe nodule with metabolic activity, SUV 3.6. A third right upper lobe nodule measures 4 mm and has faint metabolic activity. No clear evidence of malignancy in the abdomen or pelvis. Mild activity along the ventral abdominal surgical scar. Scattered activity in small bowel without focal lesion on CT.  CT biopsy right upper lobe nodule 11/02/2017 showed non-small cell carcinoma; malignant cells positive for TTF-1 and negative for p63. PD-L1 95%.  Chest CT 01/21/2018-new mediastinal adenopathy seen in the prevascular and right paratracheal stations. Largest lymph node is seen in the prevascular space measuring 2.0 cm, previously 6 mm. No hilar or axillary adenopathy. Right lung nodules are stable.  Biopsy of a right paratracheal lymph node on 02/04/2018-metastatic adenocarcinoma consistent with a lung primary  Chest radiation started 03/11/2018  Week 1 Taxol/carboplatin 03/13/2018  Week 2 Taxol/carboplatin 03/20/2018  Week 3 Taxol/carboplatin 03/27/2018  Week4Taxol/carboplatin 04/03/2018  Week 5  Taxol/carboplatin 04/10/2018  Week 6 Taxol/carboplatin 04/19/2018  Chest radiation completed 04/25/2018  CT chest 05/23/2018-interval decrease in size of mediastinal adenopathy. Near complete resolution  of previously visualized right pleural effusion. The right lung nodules have decreased in size. Other nodules are stable.  Cycle 1 durvalumab 05/23/2018  Cycle 2durvalumab 06/06/2018  Cycle 3 durvalumab 06/20/2018  Cycle 4 durvalumab 07/04/2018  Cycle 5 durvalumab 07/19/2018  Cycle 6 durvalumab 08/01/2018  Cycle 7 durvalumab 08/15/2018  CT chest 09/03/2018-collapse/consolidation with bronchiectasis and surrounding groundglass involving primarilytheright upper lobe, findings most indicative of recent radiation therapy. Previously measured right upper lobe nodule obscured. New areas of nodularity in the right lower lobe, likely infectious or inflammatory.  Cycle 8durvalumab10/07/2018  Cycle 9 durvalumab 09/18/2018  Cycle 10 durvalumab 10/02/2018  Cycle 11 durvalumab 10/16/2018   Cycle 12 durvalumab 11/13/2018  Cycle 13 durvalumab 11/30/2018  Cycle 14 durvalumab1/15/2020  [facility] [facility]no mediastinal, axillary or hilar adenopathy. Centrilobular emphysema. Right apical volume loss with bronchiectasis traversing the area of atelectatic lung. Elevation of the adjacent minor fissure due to volume loss. Subpleural left upper lobe opacity, nonspecific, measuring 8.7 mm. Compression fractures T5 and T7.  Cycle 15 durvalumab 12/25/2018  Cycle 16 durvalumab 01/29/2019  Cycle 17 durvalumab 02/12/2019  Cycle 18 durvalumab 02/26/2019  Cycle 19 durvalumab 03/12/2019  Cycle 20 durvalumab 03/26/2019  Cycle 21 durvalumab 04/16/2019  Cycle 22 durvalumab 04/30/2019  CT chest 05/12/2019-right lung radiation fibrosis, no evidence of local tumor recurrence, no evidence of metastatic disease in the chest, stable pulmonary nodules  Cycle 23 durvalumab 05/14/2019  CT chest  10/13/2019-stable radiation changes in the right lung, new nodules in the right lower lobe, left lower lobe, and lingula-favored to represent an inflammatory process  CT chest 12/30/2019-no findings to suggest recurrent or metastatic disease, subtle groundglass at the site of previous nodularity in the right lung base almost completely resolved  CT chest 06/17/2020-6 mm left lower lobe nodule-more conspicuous, stable compression deformities at T5 and T7  CT chest 10/04/2020-nodular density medial left lower lobe unchanged.  Adjacent peribronchovascular groundglass and volume loss have increased slightly and are likely infectious or inflammatory.   3. COPD  4.VATS drainage of a loculated right pleural effusion 03/01/2018  5.Port-A-Cath placement7/03/2018  6. Severe mid back pain 10/30/2018-CT thoracic spine-T5 compression fracture new since 09/03/2018-appearance consistent with an osteoporotic fracture. Pain improved.Increased back pain January 2020. CT scan 12/16/2018-new compression fractures T5 and T7. T5 vertebral body somewhat sclerotic in appearance.MRI thoracic spine 01/08/2019-acute compression fractures involving the T5 and T7 vertebral bodies stable relative to recent CT with up to 50% height loss at T5 and 25% height loss at T7. No associated bony retropulsion. Benign/mechanical features by MRI with no appreciable underlying pathologic lesion. No other evidence for metastatic disease within the thoracic spine.  Disposition: Ms. Bennetts appears unchanged.  She remains in clinical remission from lung cancer and appendix cancer.  The recent chest CT showed no evidence of disease progression.  Plan to continue to follow with observation.  She will return for a Port-A-Cath flush in 8 weeks.  Port-A-Cath flush and follow-up visit in 4 months.  Patient seen with Dr. Benay Spice.    Ned Card ANP/GNP-BC   10/06/2020  11:52 AM  This was a shared visit with Ned Card.  Ms.  Pilling is in remission from lung cancer.  She would like to keep the Port-A-Cath in place.  We will plan for a surveillance chest CT in approximately 8 months.  Julieanne Manson, MD

## 2020-10-14 ENCOUNTER — Encounter: Payer: Self-pay | Admitting: Pulmonary Disease

## 2020-10-14 ENCOUNTER — Other Ambulatory Visit: Payer: Self-pay

## 2020-10-14 ENCOUNTER — Ambulatory Visit (INDEPENDENT_AMBULATORY_CARE_PROVIDER_SITE_OTHER): Payer: Medicare Other | Admitting: Pulmonary Disease

## 2020-10-14 VITALS — BP 120/68 | HR 103 | Temp 97.7°F | Wt 130.4 lb

## 2020-10-14 DIAGNOSIS — R911 Solitary pulmonary nodule: Secondary | ICD-10-CM

## 2020-10-14 DIAGNOSIS — Z85118 Personal history of other malignant neoplasm of bronchus and lung: Secondary | ICD-10-CM

## 2020-10-14 DIAGNOSIS — J449 Chronic obstructive pulmonary disease, unspecified: Secondary | ICD-10-CM | POA: Diagnosis not present

## 2020-10-14 MED ORDER — TRELEGY ELLIPTA 100-62.5-25 MCG/INH IN AEPB
1.0000 | INHALATION_SPRAY | Freq: Every day | RESPIRATORY_TRACT | 6 refills | Status: DC
Start: 2020-10-14 — End: 2021-04-27

## 2020-10-14 MED ORDER — TRELEGY ELLIPTA 100-62.5-25 MCG/INH IN AEPB: 1.0000 | INHALATION_SPRAY | Freq: Every day | RESPIRATORY_TRACT | 0 refills | Status: DC

## 2020-10-14 MED ORDER — BENZONATATE 200 MG PO CAPS
200.0000 mg | ORAL_CAPSULE | Freq: Three times a day (TID) | ORAL | 1 refills | Status: AC | PRN
Start: 2020-10-14 — End: 2021-01-12

## 2020-10-14 NOTE — Patient Instructions (Addendum)
Thank you for visiting Dr. Valeta Harms at [facility]. Today we recommend the following:  Meds ordered this encounter  Medications  . Fluticasone-Umeclidin-Vilant (TRELEGY ELLIPTA) 100-62.5-25 MCG/INH AEPB    Sig: Inhale 1 puff into the lungs daily.    Dispense:  60 each    Refill:  6  . benzonatate (TESSALON) 200 MG capsule    Sig: Take 1 capsule (200 mg total) by mouth 3 (three) times daily as needed for cough.    Dispense:  90 capsule    Refill:  1   Return in about 6 months (around 04/13/2021) for Dr. Valeta Harms. or Derl Barrow, NP.     Please do your part to reduce the spread of COVID-19.

## 2020-10-14 NOTE — Progress Notes (Signed)
Synopsis: Referred in September 2020 for COPD management by Rory Percy, MD  Subjective:   PATIENT ID: Melissa Golden GENDER: female DOB: [birth year], MRN: [id number]  Chief Complaint  Patient presents with  . Follow-up    pt stated that she cannot get rid of her cough.  she is using the flutter valve but this makes her cough worse and non productive    74 year old female past medical history of non-small cell lung cancer.  Diagnosed in 2018 with 3 hyper medic right upper lobe pulmonary nodules.  Had a CT-guided biopsy of the upper lobe nodule in December 2018 consistent with non-small cell lung cancer, TTF-1 positive p63 negative, PD-L1 95%.  In February 2019 had new mediastinal adenopathy within the prevascular and right paratracheal lymph node.  Biopsy of the paratracheal lymph node revealed metastatic lung primary.  Patient underwent 6 weeks of Taxol carboplatinum followed by durvalumab.  Repeat CT chest in June 2020 with evidence of right lung radiation and stable nodules.  Patient also has a history of adenocarcinoma/goblet cell carcinoid of the appendix.  This was in 2018 as well.  Patient underwent laparoscopic right hemicolectomy at [facility].  Patient underwent adjuvant Xeloda.  Patient is followed here in [facility] by Dr. Benay Spice her oncologist.  Patient was referred to see me for COPD management.  And complaints of chronic congestion.  OV 08/13/2019: First diagnosed with COPD back in 2018. Currently on pulmicort + albuterol. She is using her albuterol 3 times per day. She has a travel neb machine but it doesn't work. No fevers, chills. She has a chronic cough. Occassional brings up sputum. She feels like she wheezes all of the time. She lives in [place]. Lives with her husband. Former smoker, quit in 2017, smoked for 50+ years, 1.5-2ppd.  Patient is on 2 L nasal cannula pulse POC.  OV 09/24/2019: Here today for follow-up regarding lung cancer COPD.Patient last seen in September 2020.   Patient doing well today.  Has been using her new inhaler regimen.  Currently using Symbicort plus Spiriva Respimat and as needed albuterol.  Her breathing is much improved as where she was before.  She would like to have a nebulizer at home.  She has a older machine but no tubing to connect to it.  We will get her DME supplies for this today.  She is able to keep most of her activities of daily living.  She does get dyspneic with significant exertion.  Denies fever chills night sweats weight loss denies hemoptysis.  OV 10/14/2020: Patient here today for follow-up of COPD.  Please see documented lung cancer and colon cancer history as above.  Overall doing well.  She is on Symbicort plus Spiriva at this time.  She felt like she was doing much better on Trelegy.  But due to insurance she had to switch back.  She would like to try again see if insurance would pay for Trelegy if at all possible.  She did better with this.  From a respiratory standpoint still has ongoing cough.  This is not much different from her previous cough but she seems to be annoyed by nonproductive.  Patient denies hemoptysis weight loss.  She had an recent noncontrasted CT of the chest which revealed stability of lung nodule.  This was reviewed with her and her oncologist recently.   Past Medical History:  Diagnosis Date  . Asthma   . Cancer ([place]) 2017   colon   chemo tx  . COPD (chronic  obstructive pulmonary disease) ([place])    wears 3L home O2  . Ileus following gastrointestinal surgery ([place]) 09/12/2017   required TPN support  . Lung cancer ([place]) dx'd 10/2016  . Pleural effusion, right   . Shortness of breath    with exertion     Family History  Problem Relation Age of Onset  . Cancer Mother   . Heart attack Father   . Cancer - Colon Sister      Past Surgical History:  Procedure Laterality Date  . ABDOMINAL HYSTERECTOMY    . CATARACT EXTRACTION W/ INTRAOCULAR LENS IMPLANT     Right  . CATARACT EXTRACTION W/PHACO   01/02/2013   Procedure: CATARACT EXTRACTION PHACO AND INTRAOCULAR LENS PLACEMENT (IOC);  Surgeon: Tonny Branch, MD;  Location: [facility];  Service: Ophthalmology;  Laterality: Left;  CDE: 16.19  . EYE SURGERY    . IR IMAGING GUIDED PORT INSERTION  05/31/2018  . IR THORACENTESIS ASP PLEURAL SPACE W/IMG GUIDE  02/19/2018  . LAPAROSCOPIC APPENDECTOMY  09/12/2017  . laparoscopic hemicolectomy Right 12/07/2017   DR. Stitzenberg and [place]    . LYMPH NODE BIOPSY N/A 02/04/2018   Procedure: LYMPH NODE BIOPSY;  Surgeon: Gaye Pollack, MD;  Location: [place];  Service: Thoracic;  Laterality: N/A;  . MEDIASTINOSCOPY N/A 02/04/2018   Procedure: MEDIASTINOSCOPY;  Surgeon: Gaye Pollack, MD;  Location: [place];  Service: Thoracic;  Laterality: N/A;  . PLEURAL EFFUSION DRAINAGE Right 03/01/2018   Procedure: DRAINAGE OF PLEURAL EFFUSION;  Surgeon: Gaye Pollack, MD;  Location: [place];  Service: Thoracic;  Laterality: Right;  Marland Kitchen VIDEO ASSISTED THORACOSCOPY Right 03/01/2018   Procedure: VIDEO ASSISTED THORACOSCOPY;  Surgeon: Gaye Pollack, MD;  Location[facility];  Service: Thoracic;  Laterality: Right;    Social History   Socioeconomic History  . Marital status: Married    Spouse name: Not on file  . Number of children: Not on file  . Years of education: Not on file  . Highest education level: Not on file  Occupational History  . Occupation: retired  Tobacco Use  . Smoking status: Former Smoker    Packs/day: 1.50    Years: 68.00    Pack years: 102.00    Types: Cigarettes    Quit date: 10/2016    Years since quitting: 3.9  . Smokeless tobacco: Never Used  Vaping Use  . Vaping Use: Never used  Substance and Sexual Activity  . Alcohol use: Not Currently    Alcohol/week: 6.0 standard drinks    Types: 6 Cans of beer per week  . Drug use: No  . Sexual activity: Not on file  Other Topics Concern  . Not on file  Social History Narrative  . Not on file   Social Determinants of Health    Financial Resource Strain:   . Difficulty of Paying Living Expenses: Not on file  Food Insecurity:   . Worried About Charity fundraiser in the Last Year: Not on file  . Ran Out of Food in the Last Year: Not on file  Transportation Needs:   . Lack of Transportation (Medical): Not on file  . Lack of Transportation (Non-Medical): Not on file  Physical Activity:   . Days of Exercise per Week: Not on file  . Minutes of Exercise per Session: Not on file  Stress:   . Feeling of Stress : Not on file  Social Connections:   . Frequency of Communication with  Friends and Family: Not on file  . Frequency of Social Gatherings with Friends and Family: Not on file  . Attends Religious Services: Not on file  . Active Member of Clubs or Organizations: Not on file  . Attends Archivist Meetings: Not on file  . Marital Status: Not on file  Intimate Partner Violence:   . Fear of Current or Ex-Partner: Not on file  . Emotionally Abused: Not on file  . Physically Abused: Not on file  . Sexually Abused: Not on file     No Known Allergies   Outpatient Medications Prior to Visit  Medication Sig Dispense Refill  . acetaminophen (TYLENOL) 500 MG tablet Take 2 tablets (1,000 mg total) by mouth every 6 (six) hours. 30 tablet 0  . ibuprofen (ADVIL,MOTRIN) 200 MG tablet Take 400 mg by mouth every 6 (six) hours as needed for moderate pain. Pain     . lidocaine-prilocaine (EMLA) cream Apply to portacath 30 min to 1 hour prior to flush or infusions 30 g 1  . montelukast (SINGULAIR) 10 MG tablet Take 1 tablet (10 mg total) by mouth at bedtime. 30 tablet 11  . Multiple Vitamin (MULTIVITAMIN WITH MINERALS) TABS Take 1 tablet by mouth daily.    Marland Kitchen Respiratory Therapy Supplies (FLUTTER) DEVI 1 Device by Does not apply route as needed. 1 each 0  . SPIRIVA RESPIMAT 2.5 MCG/ACT AERS INHALE 2 SPRAY(S) BY MOUTH ONCE DAILY 4 g 3  . SYMBICORT 160-4.5 MCG/ACT inhaler INHALE 2 PUFFS BY MOUTH IN THE MORNING AND AT  BEDTIME 11 g 3   No facility-administered medications prior to visit.    Review of Systems  Constitutional: Negative for chills, fever, malaise/fatigue and weight loss.  HENT: Negative for hearing loss, sore throat and tinnitus.   Eyes: Negative for blurred vision and double vision.  Respiratory: Positive for cough, sputum production and shortness of breath. Negative for hemoptysis, wheezing and stridor.   Cardiovascular: Negative for chest pain, palpitations, orthopnea, leg swelling and PND.  Gastrointestinal: Negative for abdominal pain, constipation, diarrhea, heartburn, nausea and vomiting.  Genitourinary: Negative for dysuria, hematuria and urgency.  Musculoskeletal: Negative for joint pain and myalgias.  Skin: Negative for itching and rash.  Neurological: Negative for dizziness, tingling, weakness and headaches.  Endo/Heme/Allergies: Negative for environmental allergies. Does not bruise/bleed easily.  Psychiatric/Behavioral: Negative for depression. The patient is not nervous/anxious and does not have insomnia.   All other systems reviewed and are negative.    Objective:  Physical Exam Vitals reviewed.  Constitutional:      General: She is not in acute distress.    Appearance: She is well-developed.  HENT:     Head: Normocephalic and atraumatic.     Mouth/Throat:     Pharynx: No oropharyngeal exudate.  Eyes:     Conjunctiva/sclera: Conjunctivae normal.     Pupils: Pupils are equal, round, and reactive to light.  Neck:     Vascular: No JVD.     Trachea: No tracheal deviation.     Comments: Loss of supraclavicular fat Cardiovascular:     Rate and Rhythm: Normal rate and regular rhythm.     Heart sounds: S1 normal and S2 normal.     Comments: Distant heart tones Pulmonary:     Effort: No tachypnea or accessory muscle usage.     Breath sounds: No stridor. Decreased breath sounds (throughout all lung fields) present. No wheezing, rhonchi or rales.  Abdominal:      General: Bowel sounds are  normal. There is no distension.     Palpations: Abdomen is soft.     Tenderness: There is no abdominal tenderness.  Musculoskeletal:        General: Deformity (muscle wasting ) present.  Skin:    General: Skin is warm and dry.     Capillary Refill: Capillary refill takes less than 2 seconds.     Findings: No rash.  Neurological:     Mental Status: She is alert and oriented to person, place, and time.  Psychiatric:        Behavior: Behavior normal.      Vitals:   10/14/20 1627  BP: 120/68  Pulse: (!) 103  Temp: 97.7 F (36.5 C)  TempSrc: Tympanic  SpO2: 94%  Weight: 130 lb 6 oz (59.1 kg)   94% on RA BMI Readings from Last 3 Encounters:  10/14/20 23.85 kg/m  10/06/20 23.91 kg/m  06/21/20 23.76 kg/m   Wt Readings from Last 3 Encounters:  10/14/20 130 lb 6 oz (59.1 kg)  10/06/20 130 lb 11.2 oz (59.3 kg)  06/21/20 129 lb 14.4 oz (58.9 kg)     CBC    Component Value Date/Time   WBC 5.8 06/17/2020 0930   WBC 4.3 05/31/2018 0903   RBC 4.23 06/17/2020 0930   HGB 13.2 06/17/2020 0930   HCT 40.3 06/17/2020 0930   PLT 150 06/17/2020 0930   MCV 95.3 06/17/2020 0930   MCH 31.2 06/17/2020 0930   MCHC 32.8 06/17/2020 0930   RDW 12.3 06/17/2020 0930   LYMPHSABS 0.7 06/17/2020 0930   MONOABS 0.7 06/17/2020 0930   EOSABS 0.1 06/17/2020 0930   BASOSABS 0.0 06/17/2020 0930    Chest Imaging: June 2020 CT chest: Paramediastinal right upper lobe lung radiation no evidence of recurrent tumor.  No evidence of metastatic disease.  Moderate amount of centrilobular emphysema. The patient's images have been independently reviewed by me.    CT chest 10/04/2020: Patient has evidence of significant emphysema within the chest.  She has a small remnant of a loculated pleural effusion with the right hemithorax.  There is mild nodular density within the left lower lobe unchanged from previous CT imaging. The patient's images have been independently reviewed by  me.    Pulmonary Functions Testing Results: PFT Results Latest Ref Rng & Units 12/05/2016  FVC-Pre L 1.32  FVC-Predicted Pre % 46  FVC-Post L 1.74  FVC-Predicted Post % 61  Pre FEV1/FVC % % 45  Post FEV1/FCV % % 47  FEV1-Pre L 0.60  FEV1-Predicted Pre % 27  FEV1-Post L 0.81  DLCO uncorrected ml/min/mmHg 8.30  DLCO UNC% % 37  DLVA Predicted % 51  TLC L 5.50  TLC % Predicted % 113  RV % Predicted % 186    FeNO: None   Pathology:  CT Guided BX 2018: Lung cancer: NSCLC, +TTF1, +PDL1  Mediastinoscopy 02/04/2018: Metastatic lung cancer  Echocardiogram: None   Heart Catheterization: None     Assessment & Plan:     ICD-10-CM   1. Chronic obstructive pulmonary disease, unspecified COPD type ([place])  J44.9 Fluticasone-Umeclidin-Vilant (TRELEGY ELLIPTA) 100-62.5-25 MCG/INH AEPB  2. History of lung cancer  Z85.118   3. Stage 3 severe COPD by GOLD classification ([place])  J44.9   4. Lung nodule  R91.1     Discussion:  This is a 74 year old female, severe COPD PFTs in 2018, chronic hypoxemic respiratory failure on 2 L POC.  She does not bring her home oxygen therapy with her today in  the office.  She sometimes tries to go without it.  Otherwise doing well on her current inhaler regimen felt like she was breathing better when she was on Trelegy would like to go back to this if at all possible.  Plan: Continue triple therapy inhaler regimen.  Trelegy prescription given today. If we need to we can work with [place] financial aid application to help afford this medication. I encouraged her to reach out to [place] as well to see if we can help pay for the medication. If not we will switch back to her previous. Continue DME supplies for nebulizer machine tubing as well as oxygen supply needs. If she continues to have shortness of breath or decline despite her current inhaler regimen we could consider adding azithromycin 250 mg Monday Wednesday Friday. At this time I think we should hold off. We  reviewed noncontrast CT chest which was completed recently by Dr. Benay Spice and discussed results of nodule.   Current Outpatient Medications:  .  acetaminophen (TYLENOL) 500 MG tablet, Take 2 tablets (1,000 mg total) by mouth every 6 (six) hours., Disp: 30 tablet, Rfl: 0 .  ibuprofen (ADVIL,MOTRIN) 200 MG tablet, Take 400 mg by mouth every 6 (six) hours as needed for moderate pain. Pain , Disp: , Rfl:  .  lidocaine-prilocaine (EMLA) cream, Apply to portacath 30 min to 1 hour prior to flush or infusions, Disp: 30 g, Rfl: 1 .  montelukast (SINGULAIR) 10 MG tablet, Take 1 tablet (10 mg total) by mouth at bedtime., Disp: 30 tablet, Rfl: 11 .  Multiple Vitamin (MULTIVITAMIN WITH MINERALS) TABS, Take 1 tablet by mouth daily., Disp: , Rfl:  .  Respiratory Therapy Supplies (FLUTTER) DEVI, 1 Device by Does not apply route as needed., Disp: 1 each, Rfl: 0 .  SPIRIVA RESPIMAT 2.5 MCG/ACT AERS, INHALE 2 SPRAY(S) BY MOUTH ONCE DAILY, Disp: 4 g, Rfl: 3 .  SYMBICORT 160-4.5 MCG/ACT inhaler, INHALE 2 PUFFS BY MOUTH IN THE MORNING AND AT BEDTIME, Disp: 11 g, Rfl: 3   Garner Nash, DO [facility] 10/14/2020 4:48 PM

## 2020-10-16 DIAGNOSIS — Z23 Encounter for immunization: Secondary | ICD-10-CM | POA: Diagnosis not present

## 2020-10-28 DIAGNOSIS — Z20828 Contact with and (suspected) exposure to other viral communicable diseases: Secondary | ICD-10-CM | POA: Diagnosis not present

## 2020-10-28 DIAGNOSIS — J441 Chronic obstructive pulmonary disease with (acute) exacerbation: Secondary | ICD-10-CM | POA: Diagnosis not present

## 2020-11-24 DIAGNOSIS — F419 Anxiety disorder, unspecified: Secondary | ICD-10-CM | POA: Diagnosis not present

## 2020-11-24 DIAGNOSIS — Z6823 Body mass index (BMI) 23.0-23.9, adult: Secondary | ICD-10-CM | POA: Diagnosis not present

## 2020-11-24 DIAGNOSIS — J449 Chronic obstructive pulmonary disease, unspecified: Secondary | ICD-10-CM | POA: Diagnosis not present

## 2020-11-24 DIAGNOSIS — Z87891 Personal history of nicotine dependence: Secondary | ICD-10-CM | POA: Diagnosis not present

## 2020-11-24 DIAGNOSIS — C3411 Malignant neoplasm of upper lobe, right bronchus or lung: Secondary | ICD-10-CM | POA: Diagnosis not present

## 2020-11-24 DIAGNOSIS — Z23 Encounter for immunization: Secondary | ICD-10-CM | POA: Diagnosis not present

## 2020-12-01 ENCOUNTER — Inpatient Hospital Stay: Payer: Medicare Other | Attending: Oncology

## 2020-12-01 ENCOUNTER — Other Ambulatory Visit: Payer: Self-pay

## 2020-12-01 DIAGNOSIS — Z85118 Personal history of other malignant neoplasm of bronchus and lung: Secondary | ICD-10-CM | POA: Diagnosis not present

## 2020-12-01 DIAGNOSIS — Z452 Encounter for adjustment and management of vascular access device: Secondary | ICD-10-CM | POA: Insufficient documentation

## 2020-12-01 DIAGNOSIS — C3411 Malignant neoplasm of upper lobe, right bronchus or lung: Secondary | ICD-10-CM

## 2020-12-01 DIAGNOSIS — Z95828 Presence of other vascular implants and grafts: Secondary | ICD-10-CM

## 2020-12-01 MED ORDER — HEPARIN SOD (PORK) LOCK FLUSH 100 UNIT/ML IV SOLN
500.0000 [IU] | Freq: Once | INTRAVENOUS | Status: AC
  Administered 2020-12-01: 500 [IU]
  Filled 2020-12-01: qty 5

## 2020-12-01 MED ORDER — SODIUM CHLORIDE 0.9% FLUSH
10.0000 mL | Freq: Once | INTRAVENOUS | Status: AC
  Administered 2020-12-01: 10 mL
  Filled 2020-12-01: qty 10

## 2021-02-03 ENCOUNTER — Inpatient Hospital Stay: Payer: Medicare Other | Attending: Oncology | Admitting: Oncology

## 2021-02-03 ENCOUNTER — Inpatient Hospital Stay: Payer: Medicare Other

## 2021-02-03 ENCOUNTER — Telehealth: Payer: Self-pay | Admitting: Oncology

## 2021-02-03 ENCOUNTER — Other Ambulatory Visit: Payer: Self-pay

## 2021-02-03 DIAGNOSIS — C3411 Malignant neoplasm of upper lobe, right bronchus or lung: Secondary | ICD-10-CM

## 2021-02-03 DIAGNOSIS — Z85118 Personal history of other malignant neoplasm of bronchus and lung: Secondary | ICD-10-CM | POA: Insufficient documentation

## 2021-02-03 DIAGNOSIS — Z95828 Presence of other vascular implants and grafts: Secondary | ICD-10-CM

## 2021-02-03 DIAGNOSIS — Z8589 Personal history of malignant neoplasm of other organs and systems: Secondary | ICD-10-CM | POA: Diagnosis not present

## 2021-02-03 MED ORDER — LIDOCAINE-PRILOCAINE 2.5-2.5 % EX CREA: TOPICAL_CREAM | CUTANEOUS | 1 refills | Status: DC

## 2021-02-03 MED ORDER — SODIUM CHLORIDE 0.9% FLUSH
10.0000 mL | Freq: Once | INTRAVENOUS | Status: AC
  Administered 2021-02-03: 10 mL
  Filled 2021-02-03: qty 10

## 2021-02-03 MED ORDER — HEPARIN SOD (PORK) LOCK FLUSH 100 UNIT/ML IV SOLN
500.0000 [IU] | Freq: Once | INTRAVENOUS | Status: AC
  Administered 2021-02-03: 500 [IU]
  Filled 2021-02-03: qty 5

## 2021-02-03 NOTE — Telephone Encounter (Signed)
Scheduled per los. Gave avs and calendar  

## 2021-02-03 NOTE — Progress Notes (Signed)
[facility] OFFICE PROGRESS NOTE   Diagnosis: Non-small cell lung cancer, appendix carcinoma  INTERVAL HISTORY:   Melissa Golden returns as scheduled.  She feels well.  Good appetite.  She reports intentional weight loss.  Stable dyspnea.  She continues oxygen and bronchodilator therapy.  She has received the COVID-19 and influenza vaccines.  Objective:  Vital signs in last 24 hours:  Blood pressure (!) 143/80, pulse (!) 102, temperature 97.8 F (36.6 C), temperature source Tympanic, resp. rate 18, height 5\' 2"  (1.575 m), weight 125 lb 14.4 oz (57.1 kg), SpO2 92 %.    Lymphatics: No cervical, supraclavicular, axillary, or inguinal nodes Resp: Distant breath sounds, no respiratory distress Cardio: Regular rate and rhythm GI: No mass, no hepatosplenomegaly, nontender Vascular: No leg edema   Portacath/PICC-without erythema  Lab Results:  Lab Results  Component Value Date   WBC 5.8 06/17/2020   HGB 13.2 06/17/2020   HCT 40.3 06/17/2020   MCV 95.3 06/17/2020   PLT 150 06/17/2020   NEUTROABS 4.4 06/17/2020    CMP  Lab Results  Component Value Date   NA 140 06/17/2020   K 4.1 06/17/2020   CL 104 06/17/2020   CO2 24 06/17/2020   GLUCOSE 97 06/17/2020   BUN 7 (L) 06/17/2020   CREATININE 0.64 06/17/2020   CALCIUM 9.4 06/17/2020   PROT 7.1 06/17/2020   ALBUMIN 3.6 06/17/2020   AST 15 06/17/2020   ALT 9 06/17/2020   ALKPHOS 91 06/17/2020   BILITOT 0.5 06/17/2020   GFRNONAA >60 06/17/2020   GFRAA >60 06/17/2020    Lab Results  Component Value Date   CEA1 2.44 04/19/2020     Medications: I have reviewed the patient's current medications.   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  Status postlaparoscopic appendectomy 09/12/2017. Postoperative course complicated by an ileus requiring  TPN.   Final pathology of the appendix-5 cm adenocarcinoma/goblet cell carcinoid. The tumor extended through the muscularis propria into subserosal soft tissue and mesoappendix and focally to the serosal surface. Lymphovascular space invasion identified. Perineural invasion identified. Associated acute appendicitis with perforation.  10/15/2017 CEA 1.  11/22/2017 chromogranin A-3  Status postlaparoscopic right hemicolectomy by Dr. 11/24/2017 12/07/2017. No carcinomatosis or liver metastases were seen.   Final pathologyshowed metastatic adenocarcinoma/goblet cell carcinoid in 3 of 15 pericolic lymph nodes; maximum size of largest metastasis 0.2 cm; positive for extracapsular extension. Benign terminal ileum with fibrous serosal adhesions, negative for dysplasia or carcinoma; surgically absent appendix; appendectomy site with postsurgical changes and fibrous serosal adhesions, negative for carcinoma; proximal and distal mucosal margins negative for dysplasia or carcinoma; mesenteric resection margin negative for carcinoma.  Cycle 1 adjuvant Xeloda 01/07/2018  Cycle 2 adjuvant Xeloda 01/28/2018 2. Non-small cell lung cancer  PET scan11/21/2018-3 hypermetabolic right upper lobe pulmonary nodules increased in size and metabolic activity. No mediastinal nodal metastasis. 4 mm right upper lobe nodule with metabolic activity, SUV 3.6. A third right upper lobe nodule measures 4 mm and has faint metabolic activity. No clear evidence of malignancy in the abdomen or pelvis. Mild activity along the ventral abdominal surgical scar. Scattered activity in small bowel without focal lesion on CT.  CT biopsy right upper lobe nodule 11/02/2017 showed non-small cell carcinoma; malignant cells positive for TTF-1 and negative for p63. PD-L1 95%.  Chest CT 01/21/2018-new mediastinal adenopathy seen in the prevascular and right paratracheal stations. Largest  lymph node is seen in the prevascular  space measuring 2.0 cm, previously 6 mm. No hilar or axillary adenopathy. Right lung nodules are stable.  Biopsy of a right paratracheal lymph node on 02/04/2018-metastatic adenocarcinoma consistent with a lung primary  Chest radiation started 03/11/2018  Week 1 Taxol/carboplatin 03/13/2018  Week 2 Taxol/carboplatin 03/20/2018  Week 3 Taxol/carboplatin 03/27/2018  Week4Taxol/carboplatin 04/03/2018  Week 5 Taxol/carboplatin 04/10/2018  Week 6 Taxol/carboplatin 04/19/2018  Chest radiation completed 04/25/2018  CT chest 05/23/2018-interval decrease in size of mediastinal adenopathy. Near complete resolution of previously visualized right pleural effusion. The right lung nodules have decreased in size. Other nodules are stable.  Cycle 1 durvalumab 05/23/2018  Cycle 2durvalumab 06/06/2018  Cycle 3 durvalumab 06/20/2018  Cycle 4 durvalumab 07/04/2018  Cycle 5 durvalumab 07/19/2018  Cycle 6 durvalumab 08/01/2018  Cycle 7 durvalumab 08/15/2018  CT chest 09/03/2018-collapse/consolidation with bronchiectasis and surrounding groundglass involving primarilytheright upper lobe, findings most indicative of recent radiation therapy. Previously measured right upper lobe nodule obscured. New areas of nodularity in the right lower lobe, likely infectious or inflammatory.  Cycle 8durvalumab10/07/2018  Cycle 9 durvalumab 09/18/2018  Cycle 10 durvalumab 10/02/2018  Cycle 11 durvalumab 10/16/2018   Cycle 12 durvalumab 11/13/2018  Cycle 13 durvalumab 11/30/2018  Cycle 14 durvalumab1/15/2020  [facility]no mediastinal, axillary or hilar adenopathy. Centrilobular emphysema. Right apical volume loss with bronchiectasis traversing the area of atelectatic lung. Elevation of the adjacent minor fissure due to volume loss. Subpleural left upper lobe opacity, nonspecific, measuring 8.7 mm. Compression fractures T5 and T7.  Cycle 15 durvalumab 12/25/2018  Cycle 16 durvalumab  01/29/2019  Cycle 17 durvalumab 02/12/2019  Cycle 18 durvalumab 02/26/2019  Cycle 19 durvalumab 03/12/2019  Cycle 20 durvalumab 03/26/2019  Cycle 21 durvalumab 04/16/2019  Cycle 22 durvalumab 04/30/2019  CT chest 05/12/2019-right lung radiation fibrosis, no evidence of local tumor recurrence, no evidence of metastatic disease in the chest, stable pulmonary nodules  Cycle 23 durvalumab 05/14/2019  CT chest 10/13/2019-stable radiation changes in the right lung, new nodules in the right lower lobe, left lower lobe, and lingula-favored to represent an inflammatory process  CT chest 12/30/2019-no findings to suggest recurrent or metastatic disease, subtle groundglass at the site of previous nodularity in the right lung base almost completely resolved  CT chest 06/17/2020-6 mm left lower lobe nodule-more conspicuous, stable compression deformities at T5 and T7  CT chest 10/04/2020-nodular density medial left lower lobe unchanged.  Adjacent peribronchovascular groundglass and volume loss have increased slightly and are likely infectious or inflammatory.   3. COPD  4.VATS drainage of a loculated right pleural effusion 03/01/2018  5.Port-A-Cath placement7/03/2018  6. Severe mid back pain 10/30/2018-CT thoracic spine-T5 compression fracture new since 09/03/2018-appearance consistent with an osteoporotic fracture. Pain improved.Increased back pain January 2020. CT scan 12/16/2018-new compression fractures T5 and T7. T5 vertebral body somewhat sclerotic in appearance.MRI thoracic spine 01/08/2019-acute compression fractures involving the T5 and T7 vertebral bodies stable relative to recent CT with up to 50% height loss at T5 and 25% height loss at T7. No associated bony retropulsion. Benign/mechanical features by MRI with no appreciable underlying pathologic lesion. No other evidence for metastatic disease within the thoracic spine.    Disposition: Melissa Golden is in clinical remission  from appendix and lung carcinomas.  She would like to keep the Port-A-Cath in place.  She will return for a Port-A-Cath flush in 6 weeks.  She will be scheduled for an office visit and restaging chest CT in 12 weeks.  Betsy Coder, MD  02/03/2021  12:47 PM

## 2021-02-14 ENCOUNTER — Telehealth: Payer: Self-pay | Admitting: Pulmonary Disease

## 2021-02-14 MED ORDER — BENZONATATE 200 MG PO CAPS
200.0000 mg | ORAL_CAPSULE | Freq: Three times a day (TID) | ORAL | 1 refills | Status: DC | PRN
Start: 2021-02-14 — End: 2021-04-25

## 2021-02-14 MED ORDER — MONTELUKAST SODIUM 10 MG PO TABS: 10.0000 mg | ORAL_TABLET | Freq: Every day | ORAL | 9 refills | Status: DC

## 2021-02-14 MED ORDER — MONTELUKAST SODIUM 10 MG PO TABS: 10.0000 mg | ORAL_TABLET | Freq: Every day | ORAL | 5 refills | Status: DC

## 2021-02-14 NOTE — Telephone Encounter (Signed)
Ok to refill.  [facility] 02/14/2021 4:20 PM

## 2021-02-14 NOTE — Telephone Encounter (Signed)
rxs were refilled and spoke with the pt and made her aware this was done. Nothing further needed

## 2021-02-14 NOTE — Telephone Encounter (Signed)
Called spoke with patient.  She is asking for a refill of the Benzonatate looks like it was last refilled on 10/14/20 90 capsules, 1 refills by Dr. Valeta Harms.   Sending to Dr. Valeta Harms in regards to refilling medication

## 2021-02-22 DIAGNOSIS — C3411 Malignant neoplasm of upper lobe, right bronchus or lung: Secondary | ICD-10-CM | POA: Diagnosis not present

## 2021-02-22 DIAGNOSIS — Z6823 Body mass index (BMI) 23.0-23.9, adult: Secondary | ICD-10-CM | POA: Diagnosis not present

## 2021-02-22 DIAGNOSIS — Z87891 Personal history of nicotine dependence: Secondary | ICD-10-CM | POA: Diagnosis not present

## 2021-02-22 DIAGNOSIS — J449 Chronic obstructive pulmonary disease, unspecified: Secondary | ICD-10-CM | POA: Diagnosis not present

## 2021-02-22 DIAGNOSIS — F419 Anxiety disorder, unspecified: Secondary | ICD-10-CM | POA: Diagnosis not present

## 2021-03-01 ENCOUNTER — Other Ambulatory Visit: Payer: Self-pay | Admitting: Primary Care

## 2021-04-22 DIAGNOSIS — Z6823 Body mass index (BMI) 23.0-23.9, adult: Secondary | ICD-10-CM | POA: Diagnosis not present

## 2021-04-22 DIAGNOSIS — L0390 Cellulitis, unspecified: Secondary | ICD-10-CM | POA: Diagnosis not present

## 2021-04-25 ENCOUNTER — Other Ambulatory Visit: Payer: Self-pay | Admitting: *Deleted

## 2021-04-25 MED ORDER — BENZONATATE 200 MG PO CAPS: 200.0000 mg | ORAL_CAPSULE | Freq: Three times a day (TID) | ORAL | 1 refills | Status: DC | PRN

## 2021-04-27 ENCOUNTER — Ambulatory Visit (INDEPENDENT_AMBULATORY_CARE_PROVIDER_SITE_OTHER): Payer: Medicare Other | Admitting: Pulmonary Disease

## 2021-04-27 ENCOUNTER — Encounter: Payer: Self-pay | Admitting: Pulmonary Disease

## 2021-04-27 ENCOUNTER — Ambulatory Visit: Payer: Medicare Other | Admitting: Nurse Practitioner

## 2021-04-27 ENCOUNTER — Other Ambulatory Visit: Payer: Self-pay

## 2021-04-27 VITALS — BP 144/84 | HR 98 | Temp 98.0°F | Ht 62.0 in | Wt 126.0 lb

## 2021-04-27 DIAGNOSIS — J9611 Chronic respiratory failure with hypoxia: Secondary | ICD-10-CM

## 2021-04-27 DIAGNOSIS — Z87891 Personal history of nicotine dependence: Secondary | ICD-10-CM

## 2021-04-27 DIAGNOSIS — Z720 Tobacco use: Secondary | ICD-10-CM | POA: Diagnosis not present

## 2021-04-27 DIAGNOSIS — Z85118 Personal history of other malignant neoplasm of bronchus and lung: Secondary | ICD-10-CM

## 2021-04-27 DIAGNOSIS — J449 Chronic obstructive pulmonary disease, unspecified: Secondary | ICD-10-CM

## 2021-04-27 DIAGNOSIS — C3411 Malignant neoplasm of upper lobe, right bronchus or lung: Secondary | ICD-10-CM

## 2021-04-27 MED ORDER — VENTOLIN HFA 108 (90 BASE) MCG/ACT IN AERS: 1.0000 | INHALATION_SPRAY | RESPIRATORY_TRACT | 11 refills | Status: DC | PRN

## 2021-04-27 MED ORDER — TRELEGY ELLIPTA 100-62.5-25 MCG/INH IN AEPB: 1.0000 | INHALATION_SPRAY | Freq: Every day | RESPIRATORY_TRACT | 6 refills | Status: DC

## 2021-04-27 MED ORDER — ALBUTEROL SULFATE (2.5 MG/3ML) 0.083% IN NEBU: 2.5000 mg | INHALATION_SOLUTION | RESPIRATORY_TRACT | 11 refills | Status: DC | PRN

## 2021-04-27 MED ORDER — MONTELUKAST SODIUM 10 MG PO TABS: 1.0000 | ORAL_TABLET | Freq: Every day | ORAL | 5 refills | Status: DC

## 2021-04-27 NOTE — Progress Notes (Signed)
Synopsis: Referred in September 2020 for COPD management by Rory Percy, MD  Subjective:   PATIENT ID: Melissa Golden GENDER: female DOB: 1946-04-28, MRN: [id number]  Chief Complaint  Patient presents with  . Follow-up    Dry coughing spells. Refills on tessalon    75 year old female past medical history of non-small cell lung cancer.  Diagnosed in 2018 with 3 hyper medic right upper lobe pulmonary nodules.  Had a CT-guided biopsy of the upper lobe nodule in December 2018 consistent with non-small cell lung cancer, TTF-1 positive p63 negative, PD-L1 95%.  In February 2019 had new mediastinal adenopathy within the prevascular and right paratracheal lymph node.  Biopsy of the paratracheal lymph node revealed metastatic lung primary.  Patient underwent 6 weeks of Taxol carboplatinum followed by durvalumab.  Repeat CT chest in June 2020 with evidence of right lung radiation and stable nodules.  Patient also has a history of adenocarcinoma/goblet cell carcinoid of the appendix.  This was in 2018 as well.  Patient underwent laparoscopic right hemicolectomy at [facility].  Patient underwent adjuvant Xeloda.  Patient is followed here in [facility] by Dr. Benay Spice her oncologist.  Patient was referred to see me for COPD management.  And complaints of chronic congestion.  OV 08/13/2019: First diagnosed with COPD back in 2018. Currently on pulmicort + albuterol. She is using her albuterol 3 times per day. She has a travel neb machine but it doesn't work. No fevers, chills. She has a chronic cough. Occassional brings up sputum. She feels like she wheezes all of the time. She lives in [place]. Lives with her husband. Former smoker, quit in 2017, smoked for 50+ years, 1.5-2ppd.  Patient is on 2 L nasal cannula pulse POC.  OV 09/24/2019: Here today for follow-up regarding lung cancer COPD.Patient last seen in September 2020.  Patient doing well today.  Has been using her new inhaler regimen.  Currently using  Symbicort plus Spiriva Respimat and as needed albuterol.  Her breathing is much improved as where she was before.  She would like to have a nebulizer at home.  She has a older machine but no tubing to connect to it.  We will get her DME supplies for this today.  She is able to keep most of her activities of daily living.  She does get dyspneic with significant exertion.  Denies fever chills night sweats weight loss denies hemoptysis.  OV 10/14/2020: Patient here today for follow-up of COPD.  Please see documented lung cancer and colon cancer history as above.  Overall doing well.  She is on Symbicort plus Spiriva at this time.  She felt like she was doing much better on Trelegy.  But due to insurance she had to switch back.  She would like to try again see if insurance would pay for Trelegy if at all possible.  She did better with this.  From a respiratory standpoint still has ongoing cough.  This is not much different from her previous cough but she seems to be annoyed by nonproductive.  Patient denies hemoptysis weight loss.  She had an recent noncontrasted CT of the chest which revealed stability of lung nodule.  This was reviewed with her and her oncologist recently.  OV 04/27/2021: doing well today. Breathing stable. Doing well with trelegy. Occasional wheezing. No chest tightness.  Patient doing well today overall.  Needs refills of all of her inhaler medications.  She has follow-up with oncology this week as well.  Going on vacation this summer.  Past Medical History:  Diagnosis Date  . Asthma   . Cancer ([place]) 2017   colon   chemo tx  . COPD (chronic obstructive pulmonary disease) ([place])    wears 3L home O2  . Ileus following gastrointestinal surgery ([place]) 09/12/2017   required TPN support  . Lung cancer ([place]) dx'd 10/2016  . Pleural effusion, right   . Shortness of breath    with exertion     Family History  Problem Relation Age of Onset  . Cancer Mother   . Heart attack Father   .  Cancer - Colon Sister      Past Surgical History:  Procedure Laterality Date  . ABDOMINAL HYSTERECTOMY    . CATARACT EXTRACTION W/ INTRAOCULAR LENS IMPLANT     Right  . CATARACT EXTRACTION W/PHACO  01/02/2013   Procedure: CATARACT EXTRACTION PHACO AND INTRAOCULAR LENS PLACEMENT (IOC);  Surgeon: Tonny Branch, MD;  Location: [facility];  Service: Ophthalmology;  Laterality: Left;  CDE: 16.19  . EYE SURGERY    . IR IMAGING GUIDED PORT INSERTION  05/31/2018  . IR THORACENTESIS ASP PLEURAL SPACE W/IMG GUIDE  02/19/2018  . LAPAROSCOPIC APPENDECTOMY  09/12/2017  . laparoscopic hemicolectomy Right 12/07/2017   DR. Stitzenberg and [place]    . LYMPH NODE BIOPSY N/A 02/04/2018   Procedure: LYMPH NODE BIOPSY;  Surgeon: Gaye Pollack, MD;  Location: [facility];  Service: Thoracic;  Laterality: N/A;  . MEDIASTINOSCOPY N/A 02/04/2018   Procedure: MEDIASTINOSCOPY;  Surgeon: Gaye Pollack, MD;  Location: [facility];  Service: Thoracic;  Laterality: N/A;  . PLEURAL EFFUSION DRAINAGE Right 03/01/2018   Procedure: DRAINAGE OF PLEURAL EFFUSION;  Surgeon: Gaye Pollack, MD;  Location: [facility];  Service: Thoracic;  Laterality: Right;  Marland Kitchen VIDEO ASSISTED THORACOSCOPY Right 03/01/2018   Procedure: VIDEO ASSISTED THORACOSCOPY;  Surgeon: Gaye Pollack, MD;  Location: [facility];  Service: Thoracic;  Laterality: Right;    Social History   Socioeconomic History  . Marital status: Married    Spouse name: Not on file  . Number of children: Not on file  . Years of education: Not on file  . Highest education level: Not on file  Occupational History  . Occupation: retired  Tobacco Use  . Smoking status: Former Smoker    Packs/day: 1.50    Years: 68.00    Pack years: 102.00    Types: Cigarettes    Quit date: 10/2016    Years since quitting: 4.5  . Smokeless tobacco: Never Used  Vaping Use  . Vaping Use: Never used  Substance and Sexual Activity  . Alcohol use: Not Currently    Alcohol/week: 6.0 standard  drinks    Types: 6 Cans of beer per week  . Drug use: No  . Sexual activity: Not on file  Other Topics Concern  . Not on file  Social History Narrative  . Not on file   Social Determinants of Health   Financial Resource Strain: Not on file  Food Insecurity: Not on file  Transportation Needs: Not on file  Physical Activity: Not on file  Stress: Not on file  Social Connections: Not on file  Intimate Partner Violence: Not on file     No Known Allergies   Outpatient Medications Prior to Visit  Medication Sig Dispense Refill  . acetaminophen (TYLENOL) 500 MG tablet Take 2 tablets (1,000 mg total) by mouth every 6 (six) hours. 30 tablet 0  . albuterol (PROVENTIL) (2.5  MG/3ML) 0.083% nebulizer solution Take 2.5 mg by nebulization.    . benzonatate (TESSALON) 200 MG capsule Take 1 capsule (200 mg total) by mouth 3 (three) times daily as needed for cough. 90 capsule 1  . doxycycline (VIBRA-TABS) 100 MG tablet Take 100 mg by mouth 2 (two) times daily.    . Fluticasone-Umeclidin-Vilant (TRELEGY ELLIPTA) 100-62.5-25 MCG/INH AEPB Inhale 1 puff into the lungs daily. 60 each 6  . ibuprofen (ADVIL,MOTRIN) 200 MG tablet Take 400 mg by mouth every 6 (six) hours as needed for moderate pain. Pain    . lidocaine-prilocaine (EMLA) cream Apply to portacath 30 min to 1 hour prior to flush or infusions 30 g 1  . montelukast (SINGULAIR) 10 MG tablet TAKE 1 TABLET BY MOUTH AT BEDTIME 30 tablet 5  . Multiple Vitamin (MULTIVITAMIN WITH MINERALS) TABS Take 1 tablet by mouth daily.    Marland Kitchen Respiratory Therapy Supplies (FLUTTER) DEVI 1 Device by Does not apply route as needed. 1 each 0  . VENTOLIN HFA 108 (90 Base) MCG/ACT inhaler Inhale into the lungs.     No facility-administered medications prior to visit.    Review of Systems  Constitutional: Negative for chills, fever, malaise/fatigue and weight loss.  HENT: Negative for hearing loss, sore throat and tinnitus.   Eyes: Negative for blurred vision and  double vision.  Respiratory: Positive for cough and shortness of breath. Negative for hemoptysis, sputum production, wheezing and stridor.   Cardiovascular: Negative for chest pain, palpitations, orthopnea, leg swelling and PND.  Gastrointestinal: Negative for abdominal pain, constipation, diarrhea, heartburn, nausea and vomiting.  Genitourinary: Negative for dysuria, hematuria and urgency.  Musculoskeletal: Negative for joint pain and myalgias.  Skin: Negative for itching and rash.  Neurological: Negative for dizziness, tingling, weakness and headaches.  Endo/Heme/Allergies: Negative for environmental allergies. Does not bruise/bleed easily.  Psychiatric/Behavioral: Negative for depression. The patient is not nervous/anxious and does not have insomnia.   All other systems reviewed and are negative.    Objective:  Physical Exam Vitals reviewed.  Constitutional:      General: She is not in acute distress.    Appearance: She is well-developed.  HENT:     Head: Normocephalic and atraumatic.     Mouth/Throat:     Pharynx: No oropharyngeal exudate.  Eyes:     Conjunctiva/sclera: Conjunctivae normal.     Pupils: Pupils are equal, round, and reactive to light.  Neck:     Vascular: No JVD.     Trachea: No tracheal deviation.     Comments: Loss of supraclavicular fat Cardiovascular:     Rate and Rhythm: Normal rate and regular rhythm.     Heart sounds: S1 normal and S2 normal.     Comments: Distant heart tones Pulmonary:     Effort: No tachypnea or accessory muscle usage.     Breath sounds: No stridor. Decreased breath sounds (throughout all lung fields) present. No wheezing, rhonchi or rales.  Abdominal:     General: Bowel sounds are normal. There is no distension.     Palpations: Abdomen is soft.     Tenderness: There is no abdominal tenderness.  Musculoskeletal:        General: Deformity (muscle wasting ) present.  Skin:    General: Skin is warm and dry.     Capillary Refill:  Capillary refill takes less than 2 seconds.     Findings: No rash.  Neurological:     Mental Status: She is alert and oriented to person, place,  and time.  Psychiatric:        Behavior: Behavior normal.      Vitals:   04/27/21 0900  BP: (!) 144/84  Pulse: 98  Temp: 98 F (36.7 C)  SpO2: 99%  Weight: 126 lb (57.2 kg)  Height: _0  (1.575 m)   99% on RA BMI Readings from Last 3 Encounters:  04/27/21 23.05 kg/m  02/03/21 23.03 kg/m  10/14/20 23.85 kg/m   Wt Readings from Last 3 Encounters:  04/27/21 126 lb (57.2 kg)  02/03/21 125 lb 14.4 oz (57.1 kg)  10/14/20 130 lb 6 oz (59.1 kg)     CBC    Component Value Date/Time   WBC 5.8 06/17/2020 0930   WBC 4.3 05/31/2018 0903   RBC 4.23 06/17/2020 0930   HGB 13.2 06/17/2020 0930   HCT 40.3 06/17/2020 0930   PLT 150 06/17/2020 0930   MCV 95.3 06/17/2020 0930   MCH 31.2 06/17/2020 0930   MCHC 32.8 06/17/2020 0930   RDW 12.3 06/17/2020 0930   LYMPHSABS 0.7 06/17/2020 0930   MONOABS 0.7 06/17/2020 0930   EOSABS 0.1 06/17/2020 0930   BASOSABS 0.0 06/17/2020 0930    Chest Imaging: June 2020 CT chest: Paramediastinal right upper lobe lung radiation no evidence of recurrent tumor.  No evidence of metastatic disease.  Moderate amount of centrilobular emphysema. The patient's images have been independently reviewed by me.    CT chest 10/04/2020: Patient has evidence of significant emphysema within the chest.  She has a small remnant of a loculated pleural effusion with the right hemithorax.  There is mild nodular density within the left lower lobe unchanged from previous CT imaging. The patient's images have been independently reviewed by me.    Pulmonary Functions Testing Results: PFT Results Latest Ref Rng & Units 12/05/2016  FVC-Pre L 1.32  FVC-Predicted Pre % 46  FVC-Post L 1.74  FVC-Predicted Post % 61  Pre FEV1/FVC % % 45  Post FEV1/FCV % % 47  FEV1-Pre L 0.60  FEV1-Predicted Pre % 27  FEV1-Post L 0.81  DLCO  uncorrected ml/min/mmHg 8.30  DLCO UNC% % 37  DLVA Predicted % 51  TLC L 5.50  TLC % Predicted % 113  RV % Predicted % 186    FeNO: None   Pathology:  CT Guided BX 2018: Lung cancer: NSCLC, +TTF1, +PDL1  Mediastinoscopy 02/04/2018: Metastatic lung cancer  Echocardiogram: None   Heart Catheterization: None     Assessment & Plan:     ICD-10-CM   1. Chronic obstructive pulmonary disease, unspecified COPD type ([place])  J44.9   2. Malignant neoplasm of right upper lobe of lung (HCC)  C34.11   3. History of lung cancer  Z85.118   4. Stage 3 severe COPD by GOLD classification ([place])  J44.9   5. Chronic hypoxemic respiratory failure (HCC)  J96.11   6. Tobacco abuse  Z72.0   7. Former smoker  Z87.891     Discussion:  75 year old female, severe COPD PFTs in 2018, chronic hypoxemic respiratory failure on 2 L POC at baseline.  Currently maintained on triple therapy inhaler.  She has routine follow-up scheduled with oncology later this week as well as repeat CT scan  Plan: Continue triple therapy inhaler Continue Trelegy Refills for albuterol Continue nebulized albuterol Continue O2 supplies as needed Continue POC Goal SPO2 greater than 88% If she continues to get worse could consider adding azithromycin to 50 mg Monday Wednesday Friday At the time we will hold off on  this. Patient has a repeat CT scan scheduled this week and follow-up with Dr. Benay Spice. She will within the left lower lobe stable.  I will follow-up on repeat CT scan results from this week.  It scheduled for tomorrow.  If anything needs to be done I am sure oncology will reach out.    Current Outpatient Medications:  .  acetaminophen (TYLENOL) 500 MG tablet, Take 2 tablets (1,000 mg total) by mouth every 6 (six) hours., Disp: 30 tablet, Rfl: 0 .  albuterol (PROVENTIL) (2.5 MG/3ML) 0.083% nebulizer solution, Take 2.5 mg by nebulization., Disp: , Rfl:  .  benzonatate (TESSALON) 200 MG capsule, Take 1 capsule (200  mg total) by mouth 3 (three) times daily as needed for cough., Disp: 90 capsule, Rfl: 1 .  doxycycline (VIBRA-TABS) 100 MG tablet, Take 100 mg by mouth 2 (two) times daily., Disp: , Rfl:  .  Fluticasone-Umeclidin-Vilant (TRELEGY ELLIPTA) 100-62.5-25 MCG/INH AEPB, Inhale 1 puff into the lungs daily., Disp: 60 each, Rfl: 6 .  ibuprofen (ADVIL,MOTRIN) 200 MG tablet, Take 400 mg by mouth every 6 (six) hours as needed for moderate pain. Pain, Disp: , Rfl:  .  lidocaine-prilocaine (EMLA) cream, Apply to portacath 30 min to 1 hour prior to flush or infusions, Disp: 30 g, Rfl: 1 .  montelukast (SINGULAIR) 10 MG tablet, TAKE 1 TABLET BY MOUTH AT BEDTIME, Disp: 30 tablet, Rfl: 5 .  Multiple Vitamin (MULTIVITAMIN WITH MINERALS) TABS, Take 1 tablet by mouth daily., Disp: , Rfl:  .  Respiratory Therapy Supplies (FLUTTER) DEVI, 1 Device by Does not apply route as needed., Disp: 1 each, Rfl: 0 .  VENTOLIN HFA 108 (90 Base) MCG/ACT inhaler, Inhale into the lungs., Disp: , Rfl:    Garner Nash, DO [facility] 04/27/2021 9:17 AM

## 2021-04-27 NOTE — Patient Instructions (Addendum)
Thank you for visiting Dr. Valeta Harms at [facility]. Today we recommend the following: Orders Placed This Encounter  Procedures  . Pulmonary Function Test   Meds ordered this encounter  Medications  . albuterol (PROVENTIL) (2.5 MG/3ML) 0.083% nebulizer solution    Sig: Take 3 mLs (2.5 mg total) by nebulization every 4 (four) hours as needed for wheezing or shortness of breath.    Dispense:  75 mL    Refill:  11  . VENTOLIN HFA 108 (90 Base) MCG/ACT inhaler    Sig: Inhale 1-2 puffs into the lungs every 4 (four) hours as needed for wheezing or shortness of breath.    Dispense:  18 g    Refill:  11  . Fluticasone-Umeclidin-Vilant (TRELEGY ELLIPTA) 100-62.5-25 MCG/INH AEPB    Sig: Inhale 1 puff into the lungs daily.    Dispense:  60 each    Refill:  6  . montelukast (SINGULAIR) 10 MG tablet    Sig: Take 1 tablet (10 mg total) by mouth at bedtime.    Dispense:  90 tablet    Refill:  5   Return in about 6 months (around 10/27/2021) for with APP or Dr. Valeta Harms. PFTs prior to next appt.     Please do your part to reduce the spread of COVID-19.

## 2021-04-28 ENCOUNTER — Ambulatory Visit (HOSPITAL_BASED_OUTPATIENT_CLINIC_OR_DEPARTMENT_OTHER)
Admission: RE | Admit: 2021-04-28 | Discharge: 2021-04-28 | Disposition: A | Discharge status: Home / Self Care | Payer: Medicare Other | Source: Ambulatory Visit | Attending: Oncology | Admitting: Oncology

## 2021-04-28 DIAGNOSIS — C3411 Malignant neoplasm of upper lobe, right bronchus or lung: Secondary | ICD-10-CM | POA: Diagnosis not present

## 2021-04-28 DIAGNOSIS — C3490 Malignant neoplasm of unspecified part of unspecified bronchus or lung: Secondary | ICD-10-CM | POA: Diagnosis not present

## 2021-04-29 ENCOUNTER — Encounter: Payer: Self-pay | Admitting: Nurse Practitioner

## 2021-04-29 ENCOUNTER — Inpatient Hospital Stay: Payer: Medicare Other | Attending: Oncology | Admitting: Nurse Practitioner

## 2021-04-29 ENCOUNTER — Other Ambulatory Visit: Payer: Self-pay

## 2021-04-29 ENCOUNTER — Inpatient Hospital Stay: Payer: Medicare Other

## 2021-04-29 VITALS — BP 137/78 | HR 101 | Temp 98.2°F | Resp 18 | Ht 62.0 in | Wt 125.2 lb

## 2021-04-29 DIAGNOSIS — C3411 Malignant neoplasm of upper lobe, right bronchus or lung: Secondary | ICD-10-CM

## 2021-04-29 DIAGNOSIS — J449 Chronic obstructive pulmonary disease, unspecified: Secondary | ICD-10-CM | POA: Insufficient documentation

## 2021-04-29 DIAGNOSIS — Z79899 Other long term (current) drug therapy: Secondary | ICD-10-CM | POA: Insufficient documentation

## 2021-04-29 DIAGNOSIS — Z8589 Personal history of malignant neoplasm of other organs and systems: Secondary | ICD-10-CM | POA: Insufficient documentation

## 2021-04-29 DIAGNOSIS — Z95828 Presence of other vascular implants and grafts: Secondary | ICD-10-CM

## 2021-04-29 DIAGNOSIS — Z85118 Personal history of other malignant neoplasm of bronchus and lung: Secondary | ICD-10-CM | POA: Diagnosis not present

## 2021-04-29 DIAGNOSIS — Z923 Personal history of irradiation: Secondary | ICD-10-CM | POA: Diagnosis not present

## 2021-04-29 MED ORDER — HEPARIN SOD (PORK) LOCK FLUSH 100 UNIT/ML IV SOLN
500.0000 [IU] | Freq: Once | INTRAVENOUS | Status: AC
  Administered 2021-04-29: 500 [IU]
  Filled 2021-04-29: qty 5

## 2021-04-29 MED ORDER — SODIUM CHLORIDE 0.9% FLUSH
10.0000 mL | Freq: Once | INTRAVENOUS | Status: AC
  Administered 2021-04-29: 10 mL
  Filled 2021-04-29: qty 10

## 2021-04-29 MED ORDER — LEVOFLOXACIN 500 MG PO TABS: 500.0000 mg | ORAL_TABLET | Freq: Every day | ORAL | 0 refills | Status: AC

## 2021-04-29 NOTE — Progress Notes (Signed)
[place] OFFICE PROGRESS NOTE   Diagnosis: Non-small cell lung cancer, appendix carcinoma  INTERVAL HISTORY:   Melissa Golden returns as scheduled.  She has stable dyspnea.  No change in baseline cough which is periodically productive.  No fever or chills.  She reports a good appetite.  No new areas of pain.  Objective:  Vital signs in last 24 hours:  Blood pressure 137/78, pulse (!) 101, temperature 98.2 F (36.8 C), temperature source Oral, resp. rate 18, height $RemoveBe'5\' 2"'dgAOmfFuV$  (1.575 m), weight 125 lb 3.2 oz (56.8 kg), SpO2 92 %.    HEENT: Neck without mass. Lymphatics: No palpable cervical, supraclavicular or axillary lymph nodes. Resp: Distant breath sounds.  No respiratory distress. Cardio: Distant heart sounds.  Regular. GI: Abdomen soft and nontender.  No hepatosplenomegaly. Vascular: No leg edema.  Port-A-Cath without erythema.  Lab Results:  Lab Results  Component Value Date   WBC 5.8 06/17/2020   HGB 13.2 06/17/2020   HCT 40.3 06/17/2020   MCV 95.3 06/17/2020   PLT 150 06/17/2020   NEUTROABS 4.4 06/17/2020    Imaging:  CT CHEST WO CONTRAST  Result Date: 04/28/2021 CLINICAL DATA:  Non-small cell lung cancer restaging EXAM: CT CHEST WITHOUT CONTRAST TECHNIQUE: Multidetector CT imaging of the chest was performed following the standard protocol without IV contrast. COMPARISON:  10/04/2020 FINDINGS: Cardiovascular: Right Port-A-Cath tip: Cavoatrial junction. Coronary, aortic arch, and branch vessel atherosclerotic vascular disease. Mediastinum/Nodes: No pathologic adenopathy identified. 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 New interstitial opacity and nodular airspace opacities in the aerated portions of the right upper lobe laterally favoring pneumonia and less likely to be tumor with lymphangitic carcinomatosis. The largest nodular portion measures 2.6 by 1.5 cm on image 33 series 4. Volume loss in the right  middle lobe medially. Scarring at the right lung base. Centrilobular emphysema. Stable lingular scarring. Progressive atelectasis or scarring laterally in the left lower lobe and along the left hemidiaphragm, nodular component along the diaphragm measures about 2.1 by 1.1 cm on image 114 of series 4. Faint and ill-defined ground-glass density and reticulonodular opacity in the left lower lobe. Small likely loculated right anterior pleural effusion, mildly increased from 10/04/2020. Loculated right lateral pleural effusion extending partially along the major fissure on image 43 series 2. Upper Abdomen: Abdominal aortic atherosclerotic calcification. Musculoskeletal: Stable thoracic compression fractures at the T5 and T7 levels. IMPRESSION: 1. New interstitial and nodular opacities in the right upper lobe laterally, favoring pneumonia, with tumor and lymphangitic spread considered a less likely differential diagnostic consideration. Correlate with symptoms and consider follow up surveillance by chest radiography or chest CT. 2. Mild increase in the loculated right pleural effusion. 3. Right paramediastinal fibrosis. 4. Mild increase in atelectasis or nodular scarring peripherally at the left lung base, the nodular component merit surveillance. 5.  Aortic Atherosclerosis (ICD10-I70.0). 6. Stable compression fractures at T5 and T7. Electronically Signed   By: Van Clines M.D.   On: 04/28/2021 18:56    Medications: I have reviewed the patient's current medications.  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  Status postlaparoscopic appendectomy 09/12/2017. Postoperative course complicated by an ileus requiring TPN.   Final pathology of the appendix-5 cm adenocarcinoma/goblet cell carcinoid. The tumor extended through the muscularis  propria into subserosal soft tissue and mesoappendix and focally to the serosal surface. Lymphovascular space invasion identified. Perineural invasion identified.  Associated acute appendicitis with perforation.  10/15/2017 CEA 1.  11/22/2017 chromogranin A-3  Status postlaparoscopic right hemicolectomy by Dr. Janace Litten 12/07/2017. No carcinomatosis or liver metastases were seen.   Final pathologyshowed metastatic adenocarcinoma/goblet cell carcinoid in 3 of 15 pericolic lymph nodes; maximum size of largest metastasis 0.2 cm; positive for extracapsular extension. Benign terminal ileum with fibrous serosal adhesions, negative for dysplasia or carcinoma; surgically absent appendix; appendectomy site with postsurgical changes and fibrous serosal adhesions, negative for carcinoma; proximal and distal mucosal margins negative for dysplasia or carcinoma; mesenteric resection margin negative for carcinoma.  Cycle 1 adjuvant Xeloda 01/07/2018  Cycle 2 adjuvant Xeloda 01/28/2018 2. Non-small cell lung cancer  PET [id number] hypermetabolic right upper lobe pulmonary nodules increased in size and metabolic activity. No mediastinal nodal metastasis. 4 mm right upper lobe nodule with metabolic activity, SUV 3.6. A third right upper lobe nodule measures 4 mm and has faint metabolic activity. No clear evidence of malignancy in the abdomen or pelvis. Mild activity along the ventral abdominal surgical scar. Scattered activity in small bowel without focal lesion on CT.  CT biopsy right upper lobe nodule 11/02/2017 showed non-small cell carcinoma; malignant cells positive for TTF-1 and negative for p63. PD-L1 95%.  Chest CT 01/21/2018-new mediastinal adenopathy seen in the prevascular and right paratracheal stations. Largest lymph node is seen in the prevascular space measuring 2.0 cm, previously 6 mm. No hilar or axillary adenopathy. Right lung nodules are stable.  Biopsy of a right  paratracheal lymph node on 02/04/2018-metastatic adenocarcinoma consistent with a lung primary  Chest radiation started 03/11/2018  Week 1 Taxol/carboplatin 03/13/2018  Week 2 Taxol/carboplatin 03/20/2018  Week 3 Taxol/carboplatin 03/27/2018  Week4Taxol/carboplatin 04/03/2018  Week 5 Taxol/carboplatin 04/10/2018  Week 6 Taxol/carboplatin 04/19/2018  Chest radiation completed 04/25/2018  CT chest 05/23/2018-interval decrease in size of mediastinal adenopathy. Near complete resolution of previously visualized right pleural effusion. The right lung nodules have decreased in size. Other nodules are stable.  Cycle 1 durvalumab 05/23/2018  Cycle 2durvalumab 06/06/2018  Cycle 3 durvalumab 06/20/2018  Cycle 4 durvalumab 07/04/2018  Cycle 5 durvalumab 07/19/2018  Cycle 6 durvalumab 08/01/2018  Cycle 7 durvalumab 08/15/2018  CT chest 09/03/2018-collapse/consolidation with bronchiectasis and surrounding groundglass involving primarilytheright upper lobe, findings most indicative of recent radiation therapy. Previously measured right upper lobe nodule obscured. New areas of nodularity in the right lower lobe, likely infectious or inflammatory.  Cycle 8durvalumab10/07/2018  Cycle 9 durvalumab 09/18/2018  Cycle 10 durvalumab 10/02/2018  Cycle 11 durvalumab 10/16/2018   Cycle 12 durvalumab 11/13/2018  Cycle 13 durvalumab 11/30/2018  Cycle 14 durvalumab1/15/2020  [place] [facility] [facility]no mediastinal, axillary or hilar adenopathy. Centrilobular emphysema. Right apical volume loss with bronchiectasis traversing the area of atelectatic lung. Elevation of the adjacent minor fissure due to volume loss. Subpleural left upper lobe opacity, nonspecific, measuring 8.7 mm. Compression fractures T5 and T7.  Cycle 15 durvalumab 12/25/2018  Cycle 16 durvalumab 01/29/2019  Cycle 17 durvalumab 02/12/2019  Cycle 18 durvalumab 02/26/2019  Cycle 19 durvalumab 03/12/2019  Cycle 20  durvalumab 03/26/2019  Cycle 21 durvalumab 04/16/2019  Cycle 22 durvalumab 04/30/2019  CT chest 05/12/2019-right lung radiation fibrosis, no evidence of local tumor recurrence, no evidence of metastatic disease in the chest, stable pulmonary nodules  Cycle 23 durvalumab 05/14/2019  CT chest 10/13/2019-stable radiation changes in the right lung, new nodules in the right lower lobe, left lower lobe, and lingula-favored to represent an inflammatory process  CT chest 12/30/2019-no findings to suggest recurrent or metastatic disease, subtle  groundglass at the site of previous nodularity in the right lung base almost completely resolved  CT chest 06/17/2020-6 mm left lower lobe nodule-more conspicuous, stable compression deformities at T5 and T7  CT chest 10/04/2020-nodular density medial left lower lobe unchanged.  Adjacent peribronchovascular groundglass and volume loss have increased slightly and are likely infectious or inflammatory.  CT chest 04/28/2021-new interstitial and nodular opacities in the right upper lobe laterally, favoring pneumonia, tumor and lymphangitic spread considered less likely.  Mild increase in loculated right pleural effusion.  Mild increase in atelectasis or nodular scarring peripherally at the left lung base.  Stable compression fractures at T5 and T7.   3. COPD  4.VATS drainage of a loculated right pleural effusion 03/01/2018  5.Port-A-Cath placement7/03/2018  6. Severe mid back pain 10/30/2018-CT thoracic spine-T5 compression fracture new since 09/03/2018-appearance consistent with an osteoporotic fracture. Pain improved.Increased back pain January 2020. CT scan 12/16/2018-new compression fractures T5 and T7. T5 vertebral body somewhat sclerotic in appearance.MRI thoracic spine 01/08/2019-acute compression fractures involving the T5 and T7 vertebral bodies stable relative to recent CT with up to 50% height loss at T5 and 25% height loss at T7. No associated  bony retropulsion. Benign/mechanical features by MRI with no appreciable underlying pathologic lesion. No other evidence for metastatic disease within the thoracic spine.    Disposition: Ms. Vrba appears stable.  We discussed the concern for pneumonia on the chest CT from yesterday.  No real change in baseline respiratory symptoms and no fever.  I reviewed with Dr. Benay Spice by phone.  She will begin a 7-day course of Levaquin.  I will forward a copy of the report to Dr. Valeta Harms for review.  Port-A-Cath is being flushed today.  She will return for next port flush in 6 weeks.  We will see her in follow-up in 3 months, sooner if needed.    Ned Card ANP/GNP-BC   04/29/2021  2:01 PM

## 2021-05-02 ENCOUNTER — Other Ambulatory Visit: Payer: Self-pay | Admitting: Nurse Practitioner

## 2021-05-02 ENCOUNTER — Telehealth: Payer: Self-pay

## 2021-05-02 DIAGNOSIS — C3411 Malignant neoplasm of upper lobe, right bronchus or lung: Secondary | ICD-10-CM

## 2021-05-02 NOTE — Telephone Encounter (Signed)
Called spoke with patient concerning most recent CT scan and pending scan x 63months no concerns voiced

## 2021-05-02 NOTE — Telephone Encounter (Signed)
-----   Message from Owens Shark, NP sent at 05/02/2021 12:44 PM EDT ----- Please let her know Dr. Benay Spice reviewed the CT scan-looks inflammatory.  Recommends repeat CT scan in approximately 3 months.

## 2021-05-20 DIAGNOSIS — Z20828 Contact with and (suspected) exposure to other viral communicable diseases: Secondary | ICD-10-CM | POA: Diagnosis not present

## 2021-05-20 DIAGNOSIS — C3411 Malignant neoplasm of upper lobe, right bronchus or lung: Secondary | ICD-10-CM | POA: Diagnosis not present

## 2021-05-20 DIAGNOSIS — J449 Chronic obstructive pulmonary disease, unspecified: Secondary | ICD-10-CM | POA: Diagnosis not present

## 2021-06-06 DIAGNOSIS — Z23 Encounter for immunization: Secondary | ICD-10-CM | POA: Diagnosis not present

## 2021-06-10 ENCOUNTER — Other Ambulatory Visit: Payer: Self-pay

## 2021-06-10 ENCOUNTER — Inpatient Hospital Stay: Payer: Medicare Other | Attending: Oncology

## 2021-06-10 DIAGNOSIS — Z85118 Personal history of other malignant neoplasm of bronchus and lung: Secondary | ICD-10-CM | POA: Diagnosis not present

## 2021-06-10 DIAGNOSIS — Z452 Encounter for adjustment and management of vascular access device: Secondary | ICD-10-CM | POA: Diagnosis not present

## 2021-06-10 DIAGNOSIS — Z95828 Presence of other vascular implants and grafts: Secondary | ICD-10-CM

## 2021-06-10 DIAGNOSIS — C3411 Malignant neoplasm of upper lobe, right bronchus or lung: Secondary | ICD-10-CM

## 2021-06-10 MED ORDER — HEPARIN SOD (PORK) LOCK FLUSH 100 UNIT/ML IV SOLN
500.0000 [IU] | Freq: Once | INTRAVENOUS | Status: AC
  Administered 2021-06-10: 500 [IU]
  Filled 2021-06-10: qty 5

## 2021-06-10 MED ORDER — SODIUM CHLORIDE 0.9% FLUSH
10.0000 mL | Freq: Once | INTRAVENOUS | Status: AC
  Administered 2021-06-10: 10 mL
  Filled 2021-06-10: qty 10

## 2021-06-10 NOTE — Patient Instructions (Signed)
Implanted Port Home Guide An implanted port is a device that is placed under the skin. It is usually placed in the chest. The device can be used to give IV medicine, to take blood, or for dialysis. You may have an implanted port if: You need IV medicine that would be irritating to the small veins in your hands or arms. You need IV medicines, such as antibiotics, for a long period of time. You need IV nutrition for a long period of time. You need dialysis. When you have a port, your health care provider can choose to use the port instead of veins in your arms for these procedures. You may have fewer limitations when using a port than you would if you used other types of long-term IVs, and you will likely be able to return to normal activities afteryour incision heals. An implanted port has two main parts: Reservoir. The reservoir is the part where a needle is inserted to give medicines or draw blood. The reservoir is round. After it is placed, it appears as a small, raised area under your skin. Catheter. The catheter is a thin, flexible tube that connects the reservoir to a vein. Medicine that is inserted into the reservoir goes into the catheter and then into the vein. How is my port accessed? To access your port: A numbing cream may be placed on the skin over the port site. Your health care provider will put on a mask and sterile gloves. The skin over your port will be cleaned carefully with a germ-killing soap and allowed to dry. Your health care provider will gently pinch the port and insert a needle into it. Your health care provider will check for a blood return to make sure the port is in the vein and is not clogged. If your port needs to remain accessed to get medicine continuously (constant infusion), your health care provider will place a clear bandage (dressing) over the needle site. The dressing and needle will need to be changed every week, or as told by your health care provider. What  is flushing? Flushing helps keep the port from getting clogged. Follow instructions from your health care provider about how and when to flush the port. Ports are usually flushed with saline solution or a medicine called heparin. The need for flushing will depend on how the port is used: If the port is only used from time to time to give medicines or draw blood, the port may need to be flushed: Before and after medicines have been given. Before and after blood has been drawn. As part of routine maintenance. Flushing may be recommended every 4-6 weeks. If a constant infusion is running, the port may not need to be flushed. Throw away any syringes in a disposal container that is meant for sharp items (sharps container). You can buy a sharps container from a pharmacy, or you can make one by using an empty hard plastic bottle with a cover. How long will my port stay implanted? The port can stay in for as long as your health care provider thinks it is needed. When it is time for the port to come out, a surgery will be done to remove it. The surgery will be similar to the procedure that was done to putthe port in. Follow these instructions at home:  Flush your port as told by your health care provider. If you need an infusion over several days, follow instructions from your health care provider about how to take   care of your port site. Make sure you: Wash your hands with soap and water before you change your dressing. If soap and water are not available, use alcohol-based hand sanitizer. Change your dressing as told by your health care provider. Place any used dressings or infusion bags into a plastic bag. Throw that bag in the trash. Keep the dressing that covers the needle clean and dry. Do not get it wet. Do not use scissors or sharp objects near the tube. Keep the tube clamped, unless it is being used. Check your port site every day for signs of infection. Check for: Redness, swelling, or  pain. Fluid or blood. Pus or a bad smell. Protect the skin around the port site. Avoid wearing bra straps that rub or irritate the site. Protect the skin around your port from seat belts. Place a soft pad over your chest if needed. Bathe or shower as told by your health care provider. The site may get wet as long as you are not actively receiving an infusion. Return to your normal activities as told by your health care provider. Ask your health care provider what activities are safe for you. Carry a medical alert card or wear a medical alert bracelet at all times. This will let health care providers know that you have an implanted port in case of an emergency. Get help right away if: You have redness, swelling, or pain at the port site. You have fluid or blood coming from your port site. You have pus or a bad smell coming from the port site. You have a fever. Summary Implanted ports are usually placed in the chest for long-term IV access. Follow instructions from your health care provider about flushing the port and changing bandages (dressings). Take care of the area around your port by avoiding clothing that puts pressure on the area, and by watching for signs of infection. Protect the skin around your port from seat belts. Place a soft pad over your chest if needed. Get help right away if you have a fever or you have redness, swelling, pain, drainage, or a bad smell at the port site. This information is not intended to replace advice given to you by your health care provider. Make sure you discuss any questions you have with your healthcare provider. Document Revised: 03/29/2020 Document Reviewed: 03/29/2020 Elsevier Patient Education  2022 [employer]  

## 2021-06-22 DIAGNOSIS — F419 Anxiety disorder, unspecified: Secondary | ICD-10-CM | POA: Diagnosis not present

## 2021-06-22 DIAGNOSIS — E785 Hyperlipidemia, unspecified: Secondary | ICD-10-CM | POA: Diagnosis not present

## 2021-06-22 DIAGNOSIS — C3411 Malignant neoplasm of upper lobe, right bronchus or lung: Secondary | ICD-10-CM | POA: Diagnosis not present

## 2021-06-22 DIAGNOSIS — J449 Chronic obstructive pulmonary disease, unspecified: Secondary | ICD-10-CM | POA: Diagnosis not present

## 2021-06-22 DIAGNOSIS — Z6822 Body mass index (BMI) 22.0-22.9, adult: Secondary | ICD-10-CM | POA: Diagnosis not present

## 2021-06-22 DIAGNOSIS — Z87891 Personal history of nicotine dependence: Secondary | ICD-10-CM | POA: Diagnosis not present

## 2021-07-13 ENCOUNTER — Other Ambulatory Visit: Payer: Self-pay

## 2021-07-13 DIAGNOSIS — C3411 Malignant neoplasm of upper lobe, right bronchus or lung: Secondary | ICD-10-CM

## 2021-07-13 NOTE — Progress Notes (Signed)
Labs requested by phlebotomist completed

## 2021-07-19 ENCOUNTER — Inpatient Hospital Stay: Payer: Medicare Other | Attending: Oncology

## 2021-07-19 ENCOUNTER — Inpatient Hospital Stay (HOSPITAL_BASED_OUTPATIENT_CLINIC_OR_DEPARTMENT_OTHER): Payer: Medicare Other | Admitting: Nurse Practitioner

## 2021-07-19 ENCOUNTER — Inpatient Hospital Stay: Payer: Medicare Other

## 2021-07-19 ENCOUNTER — Ambulatory Visit (HOSPITAL_BASED_OUTPATIENT_CLINIC_OR_DEPARTMENT_OTHER)
Admission: RE | Admit: 2021-07-19 | Discharge: 2021-07-19 | Disposition: A | Discharge status: Home / Self Care | Payer: Medicare Other | Source: Ambulatory Visit | Attending: Nurse Practitioner | Admitting: Nurse Practitioner

## 2021-07-19 ENCOUNTER — Encounter: Payer: Self-pay | Admitting: Nurse Practitioner

## 2021-07-19 ENCOUNTER — Other Ambulatory Visit: Payer: Self-pay

## 2021-07-19 VITALS — BP 149/74 | HR 87 | Temp 98.2°F | Resp 18 | Ht 62.0 in | Wt 123.2 lb

## 2021-07-19 DIAGNOSIS — I700 Atherosclerosis of aorta: Secondary | ICD-10-CM | POA: Diagnosis not present

## 2021-07-19 DIAGNOSIS — C3411 Malignant neoplasm of upper lobe, right bronchus or lung: Secondary | ICD-10-CM | POA: Insufficient documentation

## 2021-07-19 DIAGNOSIS — Z8589 Personal history of malignant neoplasm of other organs and systems: Secondary | ICD-10-CM | POA: Insufficient documentation

## 2021-07-19 DIAGNOSIS — J439 Emphysema, unspecified: Secondary | ICD-10-CM | POA: Diagnosis not present

## 2021-07-19 DIAGNOSIS — Z85118 Personal history of other malignant neoplasm of bronchus and lung: Secondary | ICD-10-CM | POA: Diagnosis not present

## 2021-07-19 DIAGNOSIS — Z95828 Presence of other vascular implants and grafts: Secondary | ICD-10-CM

## 2021-07-19 DIAGNOSIS — R911 Solitary pulmonary nodule: Secondary | ICD-10-CM | POA: Diagnosis not present

## 2021-07-19 DIAGNOSIS — Z923 Personal history of irradiation: Secondary | ICD-10-CM | POA: Insufficient documentation

## 2021-07-19 DIAGNOSIS — J90 Pleural effusion, not elsewhere classified: Secondary | ICD-10-CM | POA: Diagnosis not present

## 2021-07-19 LAB — CMP (CANCER CENTER ONLY)
ALT: 10 U/L (ref 0–44)
AST: 16 U/L (ref 15–41)
Albumin: 3.8 g/dL (ref 3.5–5.0)
Alkaline Phosphatase: 63 U/L (ref 38–126)
Anion gap: 6 (ref 5–15)
BUN: 6 mg/dL — ABNORMAL LOW (ref 8–23)
CO2: 29 mmol/L (ref 22–32)
Calcium: 9 mg/dL (ref 8.9–10.3)
Chloride: 104 mmol/L (ref 98–111)
Creatinine: 0.41 mg/dL — ABNORMAL LOW (ref 0.44–1.00)
GFR, Estimated: >60 mL/min (ref >60)
Glucose, Bld: 95 mg/dL (ref 70–99)
Potassium: 4.2 mmol/L (ref 3.5–5.1)
Sodium: 139 mmol/L (ref 135–145)
Total Bilirubin: 0.5 mg/dL (ref 0.3–1.2)
Total Protein: 6.9 g/dL (ref 6.5–8.1)

## 2021-07-19 LAB — CBC WITH DIFFERENTIAL (CANCER CENTER ONLY)
Abs Immature Granulocytes: 0 10*3/uL (ref 0.00–0.07)
Basophils Absolute: 0 10*3/uL (ref 0.0–0.1)
Basophils Relative: 1 %
Eosinophils Absolute: 0 10*3/uL (ref 0.0–0.5)
Eosinophils Relative: 1 %
HCT: 38.2 % (ref 36.0–46.0)
Hemoglobin: 12.6 g/dL (ref 12.0–15.0)
Immature Granulocytes: 0 %
Lymphocytes Relative: 17 %
Lymphs Abs: 0.8 10*3/uL (ref 0.7–4.0)
MCH: 31.4 pg (ref 26.0–34.0)
MCHC: 33 g/dL (ref 30.0–36.0)
MCV: 95.3 fL (ref 80.0–100.0)
Monocytes Absolute: 0.4 10*3/uL (ref 0.1–1.0)
Monocytes Relative: 9 %
Neutro Abs: 3.5 10*3/uL (ref 1.7–7.7)
Neutrophils Relative %: 72 %
Platelet Count: 138 10*3/uL — ABNORMAL LOW (ref 150–400)
RBC: 4.01 MIL/uL (ref 3.87–5.11)
RDW: 13.3 % (ref 11.5–15.5)
WBC Count: 4.8 10*3/uL (ref 4.0–10.5)
nRBC: 0 % (ref 0.0–0.2)

## 2021-07-19 MED ORDER — SODIUM CHLORIDE 0.9% FLUSH
10.0000 mL | Freq: Once | INTRAVENOUS | Status: AC
Start: 2021-07-19 — End: 2021-07-19
  Administered 2021-07-19: 10 mL

## 2021-07-19 MED ORDER — HEPARIN SOD (PORK) LOCK FLUSH 100 UNIT/ML IV SOLN
500.0000 [IU] | Freq: Once | INTRAVENOUS | Status: AC
  Administered 2021-07-19: 500 [IU]

## 2021-07-19 NOTE — Progress Notes (Signed)
[place] OFFICE PROGRESS NOTE   Diagnosis: Non-small cell lung cancer, appendix carcinoma  INTERVAL HISTORY:   Melissa Golden returns as scheduled.  No change in baseline cough and dyspnea.  She has a good appetite.  Bowels moving regularly.  No change in bowel habits.  She denies bleeding.  Objective:  Vital signs in last 24 hours:  Blood pressure (!) 149/74, pulse 87, temperature 98.2 F (36.8 C), temperature source Oral, resp. rate 18, height $RemoveBe'5\' 2"'IofRTVVNJ$  (1.575 m), weight 123 lb 3.2 oz (55.9 kg), SpO2 100 %.    HEENT: Neck without mass. Lymphatics: No palpable cervical, supraclavicular, axillary or inguinal lymph nodes. Resp: Lungs clear bilaterally. Cardio: Regular rate and rhythm. GI: Abdomen soft and nontender.  No hepatosplenomegaly. Vascular: No leg edema. Port-A-Cath without erythema.  Lab Results:  Lab Results  Component Value Date   WBC 4.8 07/19/2021   HGB 12.6 07/19/2021   HCT 38.2 07/19/2021   MCV 95.3 07/19/2021   PLT 138 (L) 07/19/2021   NEUTROABS 3.5 07/19/2021    Imaging:  No results found.  Medications: I have reviewed the patient's current medications.  Assessment/Plan: Adenocarcinoma/goblet cell carcinoid of the appendix CT scan of the abdomen/pelvis on 09/12/2017 - fluid-filled hypervascular appendix with mild reactive enteritis within the ileum.   Status post laparoscopic appendectomy 09/12/2017.  Postoperative course complicated by an ileus requiring TPN.   Final pathology of the appendix-5 cm adenocarcinoma/goblet cell carcinoid.  The tumor extended through the muscularis propria into subserosal soft tissue and mesoappendix and focally to the serosal surface.  Lymphovascular space invasion identified.  Perineural invasion identified.  Associated acute appendicitis with perforation. 10/15/2017 CEA 1. 11/22/2017 chromogranin A-3 Status post laparoscopic right hemicolectomy by Dr. Cecil Cobbs at [facility] on 12/07/2017.  No carcinomatosis or  liver metastases were seen.   Final pathology showed metastatic adenocarcinoma/goblet cell carcinoid in 3 of 15 pericolic lymph nodes; maximum size of largest metastasis 0.2 cm; positive for extracapsular extension.  Benign terminal ileum with fibrous serosal adhesions, negative for dysplasia or carcinoma; surgically absent appendix; appendectomy site with postsurgical changes and fibrous serosal adhesions, negative for carcinoma; proximal and distal mucosal margins negative for dysplasia or carcinoma; mesenteric resection margin negative for carcinoma. Cycle 1 adjuvant Xeloda 01/07/2018 Cycle 2 adjuvant Xeloda 01/28/2018 Non-small cell lung cancer PET scan 17/49/4496-7 hypermetabolic right upper lobe pulmonary nodules increased in size and metabolic activity.  No mediastinal nodal metastasis.  4 mm right upper lobe nodule with metabolic activity, SUV 3.6.  A third right upper lobe nodule measures 4 mm and has faint metabolic activity.  No clear evidence of malignancy in the abdomen or pelvis.  Mild activity along the ventral abdominal surgical scar.  Scattered activity in small bowel without focal lesion on CT. CT biopsy right upper lobe nodule 11/02/2017 showed non-small cell carcinoma; malignant cells positive for TTF-1 and negative for p63.  PD-L1 95%. Chest CT 01/21/2018-new mediastinal adenopathy seen in the prevascular and right paratracheal stations.  Largest lymph node is seen in the prevascular space measuring 2.0 cm, previously 6 mm.  No hilar or axillary adenopathy.  Right lung nodules are stable. Biopsy of a right paratracheal lymph node on 02/04/2018-metastatic adenocarcinoma consistent with a lung primary Chest radiation started 03/11/2018 Week 1 Taxol/carboplatin 03/13/2018 Week 2 Taxol/carboplatin 03/20/2018 Week 3 Taxol/carboplatin 03/27/2018 Week  4Taxol/carboplatin 04/03/2018 Week 5 Taxol/carboplatin 04/10/2018 Week 6 Taxol/carboplatin 04/19/2018 Chest radiation completed 04/25/2018 CT chest  05/23/2018-interval decrease in size of mediastinal adenopathy.  Near complete resolution of previously  visualized right pleural effusion.  The right lung nodules have decreased in size.  Other nodules are stable. Cycle 1 durvalumab 05/23/2018 Cycle 2 durvalumab 06/06/2018 Cycle 3 durvalumab 06/20/2018 Cycle 4 durvalumab 07/04/2018 Cycle 5 durvalumab 07/19/2018 Cycle 6 durvalumab 08/01/2018 Cycle 7 durvalumab 08/15/2018 CT chest 09/03/2018- collapse/consolidation with bronchiectasis and surrounding groundglass involving primarily the right upper lobe, findings most indicative of recent radiation therapy. Previously measured right upper lobe nodule obscured.  New areas of nodularity in the right lower lobe, likely infectious or inflammatory. Cycle 8 durvalumab 09/04/2018 Cycle 9 durvalumab 09/18/2018 Cycle 10 durvalumab 10/02/2018 Cycle 11 durvalumab 10/16/2018  Cycle 12 durvalumab 11/13/2018 Cycle 13 durvalumab 11/30/2018 Cycle 14 durvalumab 12/11/2018 CT chest 12/16/2018 [facility])- no mediastinal, axillary or hilar adenopathy.  Centrilobular emphysema.  Right apical volume loss with bronchiectasis traversing the area of atelectatic lung.  Elevation of the adjacent minor fissure due to volume loss.  Subpleural left upper lobe opacity, nonspecific, measuring 8.7 mm.  Compression fractures T5 and T7. Cycle 15 durvalumab 12/25/2018 Cycle 16 durvalumab 01/29/2019 Cycle 17 durvalumab 02/12/2019 Cycle 18 durvalumab 02/26/2019 Cycle 19 durvalumab 03/12/2019 Cycle 20 durvalumab 03/26/2019 Cycle 21 durvalumab 04/16/2019 Cycle 22 durvalumab 04/30/2019 CT chest 05/12/2019- right lung radiation fibrosis, no evidence of local tumor recurrence, no evidence of metastatic disease in the chest, stable pulmonary nodules Cycle 23 durvalumab 05/14/2019 CT chest 10/13/2019-stable radiation changes in the right lung, new nodules in the right lower lobe, left lower lobe, and lingula-favored to represent an inflammatory process CT  chest 12/30/2019-no findings to suggest recurrent or metastatic disease, subtle groundglass at the site of previous nodularity in the right lung base almost completely resolved CT chest 06/17/2020-6 mm left lower lobe nodule-more conspicuous, stable compression deformities at T5 and T7 CT chest 10/04/2020-nodular density medial left lower lobe unchanged.  Adjacent peribronchovascular groundglass and volume loss have increased slightly and are likely infectious or inflammatory. CT chest 04/28/2021-new interstitial and nodular opacities in the right upper lobe laterally, favoring pneumonia, tumor and lymphangitic spread considered less likely.  Mild increase in loculated right pleural effusion.  Mild increase in atelectasis or nodular scarring peripherally at the left lung base.  Stable compression fractures at T5 and T7. CT chest 07/19/2021-previously described patchy nodular foci of consolidation in the upper right lung and left lung base have resolved compatible with resolved multilobar pneumonia.  Stable radiation fibrosis upper right lung with no evidence of local tumor recurrence.  No findings suspicious for metastatic disease in the chest.     COPD   4.   VATS drainage of a loculated right pleural effusion 03/01/2018   5.  Port-A-Cath placement 05/31/2018   6.  Severe mid back pain 10/30/2018-CT thoracic spine- T5 compression fracture new since 09/03/2018- appearance consistent with an osteoporotic fracture.  Pain improved.  Increased back pain January 2020.  CT scan 12/16/2018- new compression fractures T5 and T7.  T5 vertebral body somewhat sclerotic in appearance.  MRI thoracic spine 01/08/2019-acute compression fractures involving the T5 and T7 vertebral bodies stable relative to recent CT with up to 50% height loss at T5 and 25% height loss at T7.  No associated bony retropulsion.  Benign/mechanical features by MRI with no appreciable underlying pathologic lesion.  No other evidence for metastatic disease  within the thoracic spine.       Disposition: Ms. Velie appears unchanged.  The chest CT from earlier today shows previous areas of consolidation in the upper right lung and left lung base have resolved.  There  is no evidence of progression of lung cancer.  Next noncontrast CT scan of the chest at a 40-month interval.  She has a Port-A-Cath and would like to leave this in place for now.  She will return for a Port-A-Cath flush in 6 weeks.  She will return for follow-up in 3 months.  We are available to see her sooner if needed.  Patient seen with Dr. Benay Spice.    Ned Card ANP/GNP-BC   07/19/2021  1:41 PM This was a shared visit with Ned Card.  Mr.Plantz is in clinical remission from appendix carcinoma and non-small cell lung cancer.  I reviewed the chest CT images.  She would like to keep the Port-A-Cath in place.  She will be scheduled for a restaging CT in 6 months.  I was present for greater than 50% of today's visit.  I performed medical decision making.  Julieanne Manson, MD

## 2021-07-19 NOTE — Patient Instructions (Signed)
Implanted Port Home Guide An implanted port is a device that is placed under the skin. It is usually placed in the chest. The device can be used to give IV medicine, to take blood, or for dialysis. You may have an implanted port if: You need IV medicine that would be irritating to the small veins in your hands or arms. You need IV medicines, such as antibiotics, for a long period of time. You need IV nutrition for a long period of time. You need dialysis. When you have a port, your health care provider can choose to use the port instead of veins in your arms for these procedures. You may have fewer limitations when using a port than you would if you used other types of long-term IVs, and you will likely be able to return to normal activities afteryour incision heals. An implanted port has two main parts: Reservoir. The reservoir is the part where a needle is inserted to give medicines or draw blood. The reservoir is round. After it is placed, it appears as a small, raised area under your skin. Catheter. The catheter is a thin, flexible tube that connects the reservoir to a vein. Medicine that is inserted into the reservoir goes into the catheter and then into the vein. How is my port accessed? To access your port: A numbing cream may be placed on the skin over the port site. Your health care provider will put on a mask and sterile gloves. The skin over your port will be cleaned carefully with a germ-killing soap and allowed to dry. Your health care provider will gently pinch the port and insert a needle into it. Your health care provider will check for a blood return to make sure the port is in the vein and is not clogged. If your port needs to remain accessed to get medicine continuously (constant infusion), your health care provider will place a clear bandage (dressing) over the needle site. The dressing and needle will need to be changed every week, or as told by your health care provider. What  is flushing? Flushing helps keep the port from getting clogged. Follow instructions from your health care provider about how and when to flush the port. Ports are usually flushed with saline solution or a medicine called heparin. The need for flushing will depend on how the port is used: If the port is only used from time to time to give medicines or draw blood, the port may need to be flushed: Before and after medicines have been given. Before and after blood has been drawn. As part of routine maintenance. Flushing may be recommended every 4-6 weeks. If a constant infusion is running, the port may not need to be flushed. Throw away any syringes in a disposal container that is meant for sharp items (sharps container). You can buy a sharps container from a pharmacy, or you can make one by using an empty hard plastic bottle with a cover. How long will my port stay implanted? The port can stay in for as long as your health care provider thinks it is needed. When it is time for the port to come out, a surgery will be done to remove it. The surgery will be similar to the procedure that was done to putthe port in. Follow these instructions at home:  Flush your port as told by your health care provider. If you need an infusion over several days, follow instructions from your health care provider about how to take   care of your port site. Make sure you: Wash your hands with soap and water before you change your dressing. If soap and water are not available, use alcohol-based hand sanitizer. Change your dressing as told by your health care provider. Place any used dressings or infusion bags into a plastic bag. Throw that bag in the trash. Keep the dressing that covers the needle clean and dry. Do not get it wet. Do not use scissors or sharp objects near the tube. Keep the tube clamped, unless it is being used. Check your port site every day for signs of infection. Check for: Redness, swelling, or  pain. Fluid or blood. Pus or a bad smell. Protect the skin around the port site. Avoid wearing bra straps that rub or irritate the site. Protect the skin around your port from seat belts. Place a soft pad over your chest if needed. Bathe or shower as told by your health care provider. The site may get wet as long as you are not actively receiving an infusion. Return to your normal activities as told by your health care provider. Ask your health care provider what activities are safe for you. Carry a medical alert card or wear a medical alert bracelet at all times. This will let health care providers know that you have an implanted port in case of an emergency. Get help right away if: You have redness, swelling, or pain at the port site. You have fluid or blood coming from your port site. You have pus or a bad smell coming from the port site. You have a fever. Summary Implanted ports are usually placed in the chest for long-term IV access. Follow instructions from your health care provider about flushing the port and changing bandages (dressings). Take care of the area around your port by avoiding clothing that puts pressure on the area, and by watching for signs of infection. Protect the skin around your port from seat belts. Place a soft pad over your chest if needed. Get help right away if you have a fever or you have redness, swelling, pain, drainage, or a bad smell at the port site. This information is not intended to replace advice given to you by your health care provider. Make sure you discuss any questions you have with your healthcare provider. Document Revised: 03/29/2020 Document Reviewed: 03/29/2020 Elsevier Patient Education  2022 [employer]  

## 2021-07-21 ENCOUNTER — Encounter: Payer: Self-pay | Admitting: Oncology

## 2021-08-22 DIAGNOSIS — R0902 Hypoxemia: Secondary | ICD-10-CM | POA: Diagnosis not present

## 2021-08-22 DIAGNOSIS — J449 Chronic obstructive pulmonary disease, unspecified: Secondary | ICD-10-CM | POA: Diagnosis not present

## 2021-08-22 DIAGNOSIS — Z87891 Personal history of nicotine dependence: Secondary | ICD-10-CM | POA: Diagnosis not present

## 2021-08-22 DIAGNOSIS — F419 Anxiety disorder, unspecified: Secondary | ICD-10-CM | POA: Diagnosis not present

## 2021-08-22 DIAGNOSIS — Z6823 Body mass index (BMI) 23.0-23.9, adult: Secondary | ICD-10-CM | POA: Diagnosis not present

## 2021-08-22 DIAGNOSIS — Z23 Encounter for immunization: Secondary | ICD-10-CM | POA: Diagnosis not present

## 2021-08-22 DIAGNOSIS — C3411 Malignant neoplasm of upper lobe, right bronchus or lung: Secondary | ICD-10-CM | POA: Diagnosis not present

## 2021-08-30 ENCOUNTER — Other Ambulatory Visit: Payer: Self-pay

## 2021-08-30 ENCOUNTER — Inpatient Hospital Stay: Payer: Medicare Other | Attending: Oncology

## 2021-08-30 VITALS — BP 123/66 | HR 85 | Temp 98.6°F | Resp 18

## 2021-08-30 DIAGNOSIS — Z452 Encounter for adjustment and management of vascular access device: Secondary | ICD-10-CM | POA: Diagnosis not present

## 2021-08-30 DIAGNOSIS — C3411 Malignant neoplasm of upper lobe, right bronchus or lung: Secondary | ICD-10-CM

## 2021-08-30 DIAGNOSIS — Z85118 Personal history of other malignant neoplasm of bronchus and lung: Secondary | ICD-10-CM | POA: Diagnosis not present

## 2021-08-30 DIAGNOSIS — Z95828 Presence of other vascular implants and grafts: Secondary | ICD-10-CM

## 2021-08-30 MED ORDER — SODIUM CHLORIDE 0.9% FLUSH
10.0000 mL | Freq: Once | INTRAVENOUS | Status: AC
  Administered 2021-08-30: 10 mL

## 2021-08-30 MED ORDER — HEPARIN SOD (PORK) LOCK FLUSH 100 UNIT/ML IV SOLN
500.0000 [IU] | Freq: Once | INTRAVENOUS | Status: AC
  Administered 2021-08-30: 500 [IU]

## 2021-09-21 DIAGNOSIS — Z23 Encounter for immunization: Secondary | ICD-10-CM | POA: Diagnosis not present

## 2021-10-06 DIAGNOSIS — C3411 Malignant neoplasm of upper lobe, right bronchus or lung: Secondary | ICD-10-CM | POA: Diagnosis not present

## 2021-10-06 DIAGNOSIS — R0902 Hypoxemia: Secondary | ICD-10-CM | POA: Diagnosis not present

## 2021-10-06 DIAGNOSIS — J449 Chronic obstructive pulmonary disease, unspecified: Secondary | ICD-10-CM | POA: Diagnosis not present

## 2021-10-06 DIAGNOSIS — F419 Anxiety disorder, unspecified: Secondary | ICD-10-CM | POA: Diagnosis not present

## 2021-10-06 DIAGNOSIS — Z6823 Body mass index (BMI) 23.0-23.9, adult: Secondary | ICD-10-CM | POA: Diagnosis not present

## 2021-10-06 DIAGNOSIS — Z87891 Personal history of nicotine dependence: Secondary | ICD-10-CM | POA: Diagnosis not present

## 2021-10-06 DIAGNOSIS — Z23 Encounter for immunization: Secondary | ICD-10-CM | POA: Diagnosis not present

## 2021-10-11 ENCOUNTER — Inpatient Hospital Stay: Payer: Medicare Other | Attending: Oncology | Admitting: Oncology

## 2021-10-11 ENCOUNTER — Inpatient Hospital Stay: Payer: Medicare Other

## 2021-10-11 ENCOUNTER — Other Ambulatory Visit: Payer: Self-pay

## 2021-10-11 ENCOUNTER — Encounter: Payer: Self-pay | Admitting: Oncology

## 2021-10-11 VITALS — BP 139/79 | HR 78 | Temp 98.1°F | Resp 20 | Ht 62.0 in | Wt 127.4 lb

## 2021-10-11 DIAGNOSIS — C3411 Malignant neoplasm of upper lobe, right bronchus or lung: Secondary | ICD-10-CM

## 2021-10-11 DIAGNOSIS — Z923 Personal history of irradiation: Secondary | ICD-10-CM | POA: Diagnosis not present

## 2021-10-11 DIAGNOSIS — Z85038 Personal history of other malignant neoplasm of large intestine: Secondary | ICD-10-CM | POA: Diagnosis not present

## 2021-10-11 DIAGNOSIS — Z452 Encounter for adjustment and management of vascular access device: Secondary | ICD-10-CM | POA: Diagnosis not present

## 2021-10-11 DIAGNOSIS — Z85118 Personal history of other malignant neoplasm of bronchus and lung: Secondary | ICD-10-CM | POA: Insufficient documentation

## 2021-10-11 DIAGNOSIS — Z95828 Presence of other vascular implants and grafts: Secondary | ICD-10-CM

## 2021-10-11 MED ORDER — HEPARIN SOD (PORK) LOCK FLUSH 100 UNIT/ML IV SOLN
500.0000 [IU] | Freq: Once | INTRAVENOUS | Status: AC
  Administered 2021-10-11: 500 [IU]

## 2021-10-11 MED ORDER — SODIUM CHLORIDE 0.9% FLUSH
10.0000 mL | Freq: Once | INTRAVENOUS | Status: AC
  Administered 2021-10-11: 10 mL

## 2021-10-11 NOTE — Patient Instructions (Signed)

## 2021-10-11 NOTE — Progress Notes (Signed)
[facility] OFFICE PROGRESS NOTE   Diagnosis: Non-small cell lung cancer, appendiceal cancer  INTERVAL HISTORY:   Mr. Melissa Golden returns as scheduled.  She feels well.  Good appetite.  No new complaint.  She is followed by pulmonary medicine for COPD.  Objective:  Vital signs in last 24 hours:  Blood pressure 139/79, pulse 78, temperature 98.1 F (36.7 C), temperature source Oral, resp. rate 20, height _0  (1.575 m), weight 127 lb 6.4 oz (57.8 kg), SpO2 99 %.    Lymphatics: No cervical, supraclavicular, axillary, or inguinal nodes Resp: Bilateral inspiratory/expiratory rhonchi, distant breath sounds, no respiratory distress Cardio: Regular rate and rhythm GI: No hepatosplenomegaly, no mass, nontender Vascular: No leg edema    Portacath/PICC-without erythema  Lab Results:  Lab Results  Component Value Date   WBC 4.8 07/19/2021   HGB 12.6 07/19/2021   HCT 38.2 07/19/2021   MCV 95.3 07/19/2021   PLT 138 (L) 07/19/2021   NEUTROABS 3.5 07/19/2021    CMP  Lab Results  Component Value Date   NA 139 07/19/2021   K 4.2 07/19/2021   CL 104 07/19/2021   CO2 29 07/19/2021   GLUCOSE 95 07/19/2021   BUN 6 (L) 07/19/2021   CREATININE 0.41 (L) 07/19/2021   CALCIUM 9.0 07/19/2021   PROT 6.9 07/19/2021   ALBUMIN 3.8 07/19/2021   AST 16 07/19/2021   ALT 10 07/19/2021   ALKPHOS 63 07/19/2021   BILITOT 0.5 07/19/2021   GFRNONAA >60 07/19/2021   GFRAA >60 06/17/2020    Lab Results  Component Value Date   CEA1 2.44 04/19/2020    Lab Results  Component Value Date   INR 0.89 05/31/2018   LABPROT 12.0 05/31/2018    Imaging:  No results found.  Medications: I have reviewed the patient's current medications.   Assessment/Plan: Adenocarcinoma/goblet cell carcinoid of the appendix CT scan of the abdomen/pelvis on 09/12/2017 - fluid-filled hypervascular appendix with mild reactive enteritis within the ileum.   Status post laparoscopic appendectomy  09/12/2017.  Postoperative course complicated by an ileus requiring TPN.   Final pathology of the appendix-5 cm adenocarcinoma/goblet cell carcinoid.  The tumor extended through the muscularis propria into subserosal soft tissue and mesoappendix and focally to the serosal surface.  Lymphovascular space invasion identified.  Perineural invasion identified.  Associated acute appendicitis with perforation. 10/15/2017 CEA 1. 11/22/2017 chromogranin A-3 Status post laparoscopic right hemicolectomy by Dr. Cecil Cobbs at [facility] on 12/07/2017.  No carcinomatosis or liver metastases were seen.   Final pathology showed metastatic adenocarcinoma/goblet cell carcinoid in 3 of 15 pericolic lymph nodes; maximum size of largest metastasis 0.2 cm; positive for extracapsular extension.  Benign terminal ileum with fibrous serosal adhesions, negative for dysplasia or carcinoma; surgically absent appendix; appendectomy site with postsurgical changes and fibrous serosal adhesions, negative for carcinoma; proximal and distal mucosal margins negative for dysplasia or carcinoma; mesenteric resection margin negative for carcinoma. Cycle 1 adjuvant Xeloda 01/07/2018 Cycle 2 adjuvant Xeloda 01/28/2018 Non-small cell lung cancer PET scan 18/56/3149-7 hypermetabolic right upper lobe pulmonary nodules increased in size and metabolic activity.  No mediastinal nodal metastasis.  4 mm right upper lobe nodule with metabolic activity, SUV 3.6.  A third right upper lobe nodule measures 4 mm and has faint metabolic activity.  No clear evidence of malignancy in the abdomen or pelvis.  Mild activity along the ventral abdominal surgical scar.  Scattered activity in small bowel without focal lesion on CT. CT biopsy right upper lobe nodule 11/02/2017 showed non-small  cell carcinoma; malignant cells positive for TTF-1 and negative for p63.  PD-L1 95%. Chest CT 01/21/2018-new mediastinal adenopathy seen in the prevascular and right paratracheal stations.   Largest lymph node is seen in the prevascular space measuring 2.0 cm, previously 6 mm.  No hilar or axillary adenopathy.  Right lung nodules are stable. Biopsy of a right paratracheal lymph node on 02/04/2018-metastatic adenocarcinoma consistent with a lung primary Chest radiation started 03/11/2018 Week 1 Taxol/carboplatin 03/13/2018 Week 2 Taxol/carboplatin 03/20/2018 Week 3 Taxol/carboplatin 03/27/2018 Week  4Taxol/carboplatin 04/03/2018 Week 5 Taxol/carboplatin 04/10/2018 Week 6 Taxol/carboplatin 04/19/2018 Chest radiation completed 04/25/2018 CT chest 05/23/2018-interval decrease in size of mediastinal adenopathy.  Near complete resolution of previously visualized right pleural effusion.  The right lung nodules have decreased in size.  Other nodules are stable. Cycle 1 durvalumab 05/23/2018 Cycle 2 durvalumab 06/06/2018 Cycle 3 durvalumab 06/20/2018 Cycle 4 durvalumab 07/04/2018 Cycle 5 durvalumab 07/19/2018 Cycle 6 durvalumab 08/01/2018 Cycle 7 durvalumab 08/15/2018 CT chest 09/03/2018- collapse/consolidation with bronchiectasis and surrounding groundglass involving primarily the right upper lobe, findings most indicative of recent radiation therapy. Previously measured right upper lobe nodule obscured.  New areas of nodularity in the right lower lobe, likely infectious or inflammatory. Cycle 8 durvalumab 09/04/2018 Cycle 9 durvalumab 09/18/2018 Cycle 10 durvalumab 10/02/2018 Cycle 11 durvalumab 10/16/2018  Cycle 12 durvalumab 11/13/2018 Cycle 13 durvalumab 11/30/2018 Cycle 14 durvalumab 12/11/2018 CT chest 12/16/2018 [facility])- no mediastinal, axillary or hilar adenopathy.  Centrilobular emphysema.  Right apical volume loss with bronchiectasis traversing the area of atelectatic lung.  Elevation of the adjacent minor fissure due to volume loss.  Subpleural left upper lobe opacity, nonspecific, measuring 8.7 mm.  Compression fractures T5 and T7. Cycle 15 durvalumab 12/25/2018 Cycle 16 durvalumab  01/29/2019 Cycle 17 durvalumab 02/12/2019 Cycle 18 durvalumab 02/26/2019 Cycle 19 durvalumab 03/12/2019 Cycle 20 durvalumab 03/26/2019 Cycle 21 durvalumab 04/16/2019 Cycle 22 durvalumab 04/30/2019 CT chest 05/12/2019- right lung radiation fibrosis, no evidence of local tumor recurrence, no evidence of metastatic disease in the chest, stable pulmonary nodules Cycle 23 durvalumab 05/14/2019 CT chest 10/13/2019-stable radiation changes in the right lung, new nodules in the right lower lobe, left lower lobe, and lingula-favored to represent an inflammatory process CT chest 12/30/2019-no findings to suggest recurrent or metastatic disease, subtle groundglass at the site of previous nodularity in the right lung base almost completely resolved CT chest 06/17/2020-6 mm left lower lobe nodule-more conspicuous, stable compression deformities at T5 and T7 CT chest 10/04/2020-nodular density medial left lower lobe unchanged.  Adjacent peribronchovascular groundglass and volume loss have increased slightly and are likely infectious or inflammatory. CT chest 04/28/2021-new interstitial and nodular opacities in the right upper lobe laterally, favoring pneumonia, tumor and lymphangitic spread considered less likely.  Mild increase in loculated right pleural effusion.  Mild increase in atelectasis or nodular scarring peripherally at the left lung base.  Stable compression fractures at T5 and T7. CT chest 07/19/2021-previously described patchy nodular foci of consolidation in the upper right lung and left lung base have resolved compatible with resolved multilobar pneumonia.  Stable radiation fibrosis upper right lung with no evidence of local tumor recurrence.  No findings suspicious for metastatic disease in the chest.     COPD   4.   VATS drainage of a loculated right pleural effusion 03/01/2018   5.  Port-A-Cath placement 05/31/2018   6.  Severe mid back pain 10/30/2018-CT thoracic spine- T5 compression fracture new since  09/03/2018- appearance consistent with an osteoporotic fracture.  Pain improved.  Increased  back pain January 2020.  CT scan 12/16/2018- new compression fractures T5 and T7.  T5 vertebral body somewhat sclerotic in appearance.  MRI thoracic spine 01/08/2019-acute compression fractures involving the T5 and T7 vertebral bodies stable relative to recent CT with up to 50% height loss at T5 and 25% height loss at T7.  No associated bony retropulsion.  Benign/mechanical features by MRI with no appreciable underlying pathologic lesion.  No other evidence for metastatic disease within the thoracic spine.         Disposition: [name] is in clinical remission from lung cancer and appendix cancer.  She would like to keep the Port-A-Cath in place.  She will return for a Port-A-Cath flush in 8 weeks.  She will be scheduled for an office visit and surveillance chest CT in 16 weeks.    Melissa Coder, MD  10/11/2021  12:55 PM

## 2021-10-24 ENCOUNTER — Other Ambulatory Visit (HOSPITAL_COMMUNITY): Payer: Medicare Other

## 2021-10-27 ENCOUNTER — Encounter: Payer: Self-pay | Admitting: Primary Care

## 2021-10-27 ENCOUNTER — Ambulatory Visit (INDEPENDENT_AMBULATORY_CARE_PROVIDER_SITE_OTHER): Payer: Medicare Other | Admitting: Pulmonary Disease

## 2021-10-27 ENCOUNTER — Ambulatory Visit (INDEPENDENT_AMBULATORY_CARE_PROVIDER_SITE_OTHER): Payer: Medicare Other | Admitting: Primary Care

## 2021-10-27 ENCOUNTER — Other Ambulatory Visit: Payer: Self-pay

## 2021-10-27 DIAGNOSIS — J479 Bronchiectasis, uncomplicated: Secondary | ICD-10-CM

## 2021-10-27 DIAGNOSIS — J9611 Chronic respiratory failure with hypoxia: Secondary | ICD-10-CM

## 2021-10-27 DIAGNOSIS — C3411 Malignant neoplasm of upper lobe, right bronchus or lung: Secondary | ICD-10-CM

## 2021-10-27 DIAGNOSIS — J449 Chronic obstructive pulmonary disease, unspecified: Secondary | ICD-10-CM

## 2021-10-27 LAB — PULMONARY FUNCTION TEST
DL/VA % pred: 53 %
DL/VA: 2.24 ml/min/mmHg/L
DLCO cor % pred: 37 %
DLCO cor: 6.88 ml/min/mmHg
DLCO unc % pred: 37 %
DLCO unc: 6.88 ml/min/mmHg
FEF 25-75 Post: 0.28 L/sec
FEF 25-75 Pre: 0.29 L/sec
FEF2575-%Change-Post: -1 %
FEF2575-%Pred-Post: 17 %
FEF2575-%Pred-Pre: 17 %
FEV1-%Change-Post: 0 %
FEV1-%Pred-Post: 36 %
FEV1-%Pred-Pre: 36 %
FEV1-Post: 0.73 L
FEV1-Pre: 0.73 L
FEV1FVC-%Change-Post: 3 %
FEV1FVC-%Pred-Pre: 62 %
FEV6-%Change-Post: 5 %
FEV6-%Pred-Post: 57 %
FEV6-%Pred-Pre: 54 %
FEV6-Post: 1.46 L
FEV6-Pre: 1.39 L
FEV6FVC-%Change-Post: 8 %
FEV6FVC-%Pred-Post: 101 %
FEV6FVC-%Pred-Pre: 94 %
FVC-%Change-Post: -2 %
FVC-%Pred-Post: 56 %
FVC-%Pred-Pre: 58 %
FVC-Post: 1.51 L
FVC-Pre: 1.55 L
Post FEV1/FVC ratio: 48 %
Post FEV6/FVC ratio: 97 %
Pre FEV1/FVC ratio: 47 %
Pre FEV6/FVC Ratio: 89 %
RV % pred: 119 %
RV: 2.61 L
TLC % pred: 90 %
TLC: 4.36 L

## 2021-10-27 NOTE — Assessment & Plan Note (Addendum)
-   Following with Dr. Benay Spice with Oncology, last seen in November 2022. CT chest wo contrast in August 2022 showed CT chest 07/19/2021-previously described patchy nodular foci of consolidation in the upper right lung and left lung base have resolved compatible with resolved multilobar pneumonia.  Stable radiation fibrosis upper right lung with no evidence of local tumor recurrence.  No findings suspicious for metastatic disease in the chest.

## 2021-10-27 NOTE — Assessment & Plan Note (Signed)
-   O2 88% RA today, improved to 94% with rest - Continue supplemental oxygen 2L with moderate exertion to maintain O2 >88-90% and at night

## 2021-10-27 NOTE — Progress Notes (Addendum)
$'@Patient'K$  ID: Melissa Golden, female    DOB: June 19, 1946, 75 y.o.   MRN: [id number]  Chief Complaint  Patient presents with   Follow-up    Referring provider: Rory Percy, MD  HPI: 75 year old female past medical history of non-small cell lung cancer.  Diagnosed in 2018 with 3 hyper medic right upper lobe pulmonary nodules.  Had a CT-guided biopsy of the upper lobe nodule in December 2018 consistent with non-small cell lung cancer, TTF-1 positive p63 negative, PD-L1 95%.  In February 2019 had new mediastinal adenopathy within the prevascular and right paratracheal lymph node.  Biopsy of the paratracheal lymph node revealed metastatic lung primary.  Patient underwent 6 weeks of Taxol carboplatinum followed by durvalumab.  Repeat CT chest in June 2020 with evidence of right lung radiation and stable nodules.  Patient also has a history of adenocarcinoma/goblet cell carcinoid of the appendix.  This was in 2018 as well.  Patient underwent laparoscopic right hemicolectomy at [facility].  Patient underwent adjuvant Xeloda.  Patient is followed here in [facility] by Dr. Benay Spice her oncologist.  Patient was referred to see me for COPD management.  And complaints of chronic congestion.  Previous [facility] pulmonary encounter: OV 08/13/2019: First diagnosed with COPD back in 2018. Currently on pulmicort + albuterol. She is using her albuterol 3 times per day. She has a travel neb machine but it doesn't work. No fevers, chills. She has a chronic cough. Occassional brings up sputum. She feels like she wheezes all of the time. She lives in [place]. Lives with her husband. Former smoker, quit in 2017, smoked for 50+ years, 1.5-2ppd.  Patient is on 2 L nasal cannula pulse POC.  OV 09/24/2019: Here today for follow-up regarding lung cancer COPD.Patient last seen in September 2020.  Patient doing well today.  Has been using her new inhaler regimen.  Currently using Symbicort plus Spiriva Respimat and as needed albuterol.   Her breathing is much improved as where she was before.  She would like to have a nebulizer at home.  She has a older machine but no tubing to connect to it.  We will get her DME supplies for this today.  She is able to keep most of her activities of daily living.  She does get dyspneic with significant exertion.  Denies fever chills night sweats weight loss denies hemoptysis.  OV 10/14/2020: Patient here today for follow-up of COPD.  Please see documented lung cancer and colon cancer history as above.  Overall doing well.  She is on Symbicort plus Spiriva at this time.  She felt like she was doing much better on Trelegy.  But due to insurance she had to switch back.  She would like to try again see if insurance would pay for Trelegy if at all possible.  She did better with this.  From a respiratory standpoint still has ongoing cough.  This is not much different from her previous cough but she seems to be annoyed by nonproductive.  Patient denies hemoptysis weight loss.  She had an recent noncontrasted CT of the chest which revealed stability of lung nodule.  This was reviewed with her and her oncologist recently.  OV 04/27/2021: doing well today. Breathing stable. Doing well with trelegy. Occasional wheezing. No chest tightness.  Patient doing well today overall.  Needs refills of all of her inhaler medications.  She has follow-up with oncology this week as well.  Going on vacation this summer.  10/27/2021- Interim hx  Patient presents today  for 6 month follow-up with PFTs. No recent hospitalizations. Breathing is basically the same. She is compliant with Trelegy 144mcg one puff daily. She continues to have a lot of chest congestion which is chronic for her. Cough is nonproductive, she has a difficulty time getting mucus up. She is not able to take mucinex d/t side effects and states that robitussin does not work. Flutter valve has also not helped with mucoid clearance. Her family member is asking about  procedures available to have mucus "sucked out". We discussed possibly of adding therapy vest to assist with pulmonary clearance but she is hesitant to try this.   Pulmonary function testing: 12/05/16 - FVC 1.74 (61%), FEV1 0.81 (37%), ratio 47   10/27/2021 - FVC 1.51 (56%), FEV1 0.73 (36%), ratio 48, DLCOunc 6.88 (37%)  No Known Allergies  Immunization History  Administered Date(s) Administered   Fluad Quad(high Dose 65+) 09/27/2021   Influenza-Unspecified 08/27/2017, 08/28/2018, 10/08/2019   Moderna Sars-Covid-2 Vaccination 02/04/2020, 02/14/2020    Past Medical History:  Diagnosis Date   Asthma    Cancer ([place]) 2017   colon   chemo tx   COPD (chronic obstructive pulmonary disease) ([place])    wears 3L home O2   Ileus following gastrointestinal surgery ([place]) 09/12/2017   required TPN support   Lung cancer ([place]) dx'd 10/2016   Pleural effusion, right    Shortness of breath    with exertion    Tobacco History: Social History   Tobacco Use  Smoking Status Former   Packs/day: 1.50   Years: 68.00   Pack years: 102.00   Types: Cigarettes   Quit date: 10/2016   Years since quitting: 5.0  Smokeless Tobacco Never   Counseling given: Not Answered   Outpatient Medications Prior to Visit  Medication Sig Dispense Refill   acetaminophen (TYLENOL) 500 MG tablet Take 2 tablets (1,000 mg total) by mouth every 6 (six) hours. 30 tablet 0   albuterol (PROVENTIL) (2.5 MG/3ML) 0.083% nebulizer solution Take 3 mLs (2.5 mg total) by nebulization every 4 (four) hours as needed for wheezing or shortness of breath. 75 mL 11   benzonatate (TESSALON) 200 MG capsule Take 1 capsule (200 mg total) by mouth 3 (three) times daily as needed for cough. 90 capsule 1   Fluticasone-Umeclidin-Vilant (TRELEGY ELLIPTA) 100-62.5-25 MCG/INH AEPB Inhale 1 puff into the lungs daily. 60 each 6   ibuprofen (ADVIL,MOTRIN) 200 MG tablet Take 400 mg by mouth every 6 (six) hours as needed for moderate pain. Pain      lidocaine-prilocaine (EMLA) cream Apply to portacath 30 min to 1 hour prior to flush or infusions 30 g 1   montelukast (SINGULAIR) 10 MG tablet Take 1 tablet (10 mg total) by mouth at bedtime. 90 tablet 5   Multiple Vitamin (MULTIVITAMIN WITH MINERALS) TABS Take 1 tablet by mouth daily.     Respiratory Therapy Supplies (FLUTTER) DEVI 1 Device by Does not apply route as needed. 1 each 0   VENTOLIN HFA 108 (90 Base) MCG/ACT inhaler Inhale 1-2 puffs into the lungs every 4 (four) hours as needed for wheezing or shortness of breath. 18 g 11   No facility-administered medications prior to visit.   Review of Systems  Review of Systems  Constitutional: Negative.   HENT: Negative.    Respiratory: Negative.    Cardiovascular: Negative.     Physical Exam  BP 124/64 (BP Location: Left Arm, Patient Position: Sitting, Cuff Size: Normal)   Pulse 99   Temp 98.1 F (  36.7 C) (Oral)   Ht $R'5\' 2"'HX$  (1.575 m)   Wt 127 lb (57.6 kg)   SpO2 94%   BMI 23.23 kg/m  Physical Exam Constitutional:      Appearance: Normal appearance.  HENT:     Head: Normocephalic and atraumatic.     Mouth/Throat:     Mouth: Mucous membranes are moist.     Pharynx: Oropharynx is clear.  Cardiovascular:     Rate and Rhythm: Normal rate and regular rhythm.  Pulmonary:     Effort: Pulmonary effort is normal.     Breath sounds: Normal breath sounds. No wheezing or rales.     Comments: Rhonchi right base Musculoskeletal:        General: Normal range of motion.  Skin:    General: Skin is warm and dry.  Neurological:     General: No focal deficit present.     Mental Status: She is alert and oriented to person, place, and time. Mental status is at baseline.  Psychiatric:        Mood and Affect: Mood normal.        Behavior: Behavior normal.        Thought Content: Thought content normal.        Judgment: Judgment normal.     Lab Results:  CBC    Component Value Date/Time   WBC 4.8 07/19/2021 1113   WBC 4.3  05/31/2018 0903   RBC 4.01 07/19/2021 1113   HGB 12.6 07/19/2021 1113   HCT 38.2 07/19/2021 1113   PLT 138 (L) 07/19/2021 1113   MCV 95.3 07/19/2021 1113   MCH 31.4 07/19/2021 1113   MCHC 33.0 07/19/2021 1113   RDW 13.3 07/19/2021 1113   LYMPHSABS 0.8 07/19/2021 1113   MONOABS 0.4 07/19/2021 1113   EOSABS 0.0 07/19/2021 1113   BASOSABS 0.0 07/19/2021 1113    BMET    Component Value Date/Time   NA 139 07/19/2021 1113   K 4.2 07/19/2021 1113   CL 104 07/19/2021 1113   CO2 29 07/19/2021 1113   GLUCOSE 95 07/19/2021 1113   BUN 6 (L) 07/19/2021 1113   CREATININE 0.41 (L) 07/19/2021 1113   CALCIUM 9.0 07/19/2021 1113   GFRNONAA >60 07/19/2021 1113   GFRAA >60 06/17/2020 0930    BNP No results found for: BNP  ProBNP No results found for: PROBNP  Imaging: No results found.   Assessment & Plan:   COPD (chronic obstructive pulmonary disease) ([place]) - Former smoker. Patient has severe obstructive airway disease. She is on triple inhaler therapy. Breathing remains baseline. No recent exacerbations or hospitalizations. PFTs remain relatively stable compared to 2018. FEV1 0.73 (36%), ratio 48. Continue Trelegy 159mcg daily and prn albuterol hfa/neb every 6 hours as needed for sob/wheezing. FU in 6 months with Dr. Valeta Harms or sooner if needed.   Bronchiectasis (H[facility]) - Chronic chest congestion with non-productive cough. She has coarse rhonchi right mid-lower lobe. Bronchiectasis noted on CT imaging. She has tried and failed both mucinex and flutter valve. We had a long discussion about available therapeutic options to help assist with mucoid clearance. Recommend patient be tried on therapy vest, order has been placed.   Chronic respiratory failure with hypoxia (HCC) - O2 88% RA today, improved to 94% with rest - Continue supplemental oxygen 2L with moderate exertion to maintain O2 >88-90% and at night   Malignant neoplasm of right upper lobe of lung ([place]) - Following with Dr.  Benay Spice with Oncology, last seen in November  2022. CT chest wo contrast in August 2022 showed CT chest 07/19/2021-previously described patchy nodular foci of consolidation in the upper right lung and left lung base have resolved compatible with resolved multilobar pneumonia.  Stable radiation fibrosis upper right lung with no evidence of local tumor recurrence.  No findings suspicious for metastatic disease in the chest.  40 mins spent on case: >50% face to face with patient   Martyn Ehrich, NP 10/27/2021

## 2021-10-27 NOTE — Progress Notes (Signed)
PFT done today. 

## 2021-10-27 NOTE — Assessment & Plan Note (Addendum)
-   Former smoker. Patient has severe obstructive airway disease. She is on triple inhaler therapy. Breathing remains baseline. No recent exacerbations or hospitalizations. PFTs remain relatively stable compared to 2018. FEV1 0.73 (36%), ratio 48. Continue Trelegy 113mcg daily and prn albuterol hfa/neb every 6 hours as needed for sob/wheezing. FU in 6 months with Dr. Valeta Harms or sooner if needed.

## 2021-10-27 NOTE — Patient Instructions (Addendum)
Recommendations: - Continue Trelegy 166mcg one puff daily; Albuterol inhaler OR nebulizer every 6 hours as needed for breakthrough shortness of breath/wheezing - Continue Singulair 10mg  daily  - Continue to wear supplemental oxygen with moderate exertion to maintain O2 >88% and at night   Orders: - Therapy vest re: bronchiectasis   Follow-up: - 6 months with Dr. Valeta Harms or sooner if needed

## 2021-10-27 NOTE — Assessment & Plan Note (Signed)
-   Chronic chest congestion with non-productive cough. She has coarse rhonchi right mid-lower lobe. Bronchiectasis noted on CT imaging. She has tried and failed both mucinex and flutter valve. We had a long discussion about available therapeutic options to help assist with mucoid clearance. Recommend patient be tried on therapy vest, order has been placed.

## 2021-10-27 NOTE — Addendum Note (Signed)
Addended by: Vanessa Barbara on: 10/27/2021 12:36 PM   Modules accepted: Orders

## 2021-11-03 DIAGNOSIS — Z20822 Contact with and (suspected) exposure to covid-19: Secondary | ICD-10-CM | POA: Diagnosis not present

## 2021-11-24 ENCOUNTER — Telehealth: Payer: Self-pay | Admitting: Primary Care

## 2021-12-06 ENCOUNTER — Inpatient Hospital Stay: Payer: Medicare Other | Attending: Oncology

## 2021-12-06 ENCOUNTER — Other Ambulatory Visit: Payer: Self-pay

## 2021-12-06 ENCOUNTER — Telehealth: Payer: Self-pay | Admitting: Primary Care

## 2021-12-06 VITALS — BP 135/68 | HR 86 | Temp 98.2°F | Resp 20

## 2021-12-06 DIAGNOSIS — Z95828 Presence of other vascular implants and grafts: Secondary | ICD-10-CM

## 2021-12-06 DIAGNOSIS — Z452 Encounter for adjustment and management of vascular access device: Secondary | ICD-10-CM | POA: Insufficient documentation

## 2021-12-06 DIAGNOSIS — Z85118 Personal history of other malignant neoplasm of bronchus and lung: Secondary | ICD-10-CM | POA: Insufficient documentation

## 2021-12-06 DIAGNOSIS — C3411 Malignant neoplasm of upper lobe, right bronchus or lung: Secondary | ICD-10-CM

## 2021-12-06 MED ORDER — TRELEGY ELLIPTA 100-62.5-25 MCG/ACT IN AEPB: 1.0000 | INHALATION_SPRAY | Freq: Every day | RESPIRATORY_TRACT | 5 refills | Status: DC

## 2021-12-06 MED ORDER — SODIUM CHLORIDE 0.9% FLUSH
10.0000 mL | Freq: Once | INTRAVENOUS | Status: AC
  Administered 2021-12-06: 10 mL

## 2021-12-06 MED ORDER — HEPARIN SOD (PORK) LOCK FLUSH 100 UNIT/ML IV SOLN
500.0000 [IU] | Freq: Once | INTRAVENOUS | Status: AC
  Administered 2021-12-06: 500 [IU]

## 2021-12-06 MED ORDER — MONTELUKAST SODIUM 10 MG PO TABS: 10.0000 mg | ORAL_TABLET | Freq: Every day | ORAL | 5 refills | Status: DC

## 2021-12-06 NOTE — Patient Instructions (Signed)

## 2021-12-06 NOTE — Telephone Encounter (Signed)
I called the patient and let her know that her refill have been sent to the pharmacy. Nothing further needed.

## 2021-12-20 ENCOUNTER — Telehealth: Payer: Self-pay | Admitting: Primary Care

## 2021-12-20 ENCOUNTER — Other Ambulatory Visit: Payer: Self-pay

## 2021-12-20 DIAGNOSIS — J449 Chronic obstructive pulmonary disease, unspecified: Secondary | ICD-10-CM

## 2021-12-21 NOTE — Telephone Encounter (Signed)
Community message sent to our [employer] team for them to handle this. Nothing further needed.

## 2022-01-05 DIAGNOSIS — Z1231 Encounter for screening mammogram for malignant neoplasm of breast: Secondary | ICD-10-CM | POA: Diagnosis not present

## 2022-01-13 NOTE — Telephone Encounter (Signed)
Seems like encounter was open in error so closing encounter.  

## 2022-01-23 DIAGNOSIS — Z20822 Contact with and (suspected) exposure to covid-19: Secondary | ICD-10-CM | POA: Diagnosis not present

## 2022-01-31 ENCOUNTER — Inpatient Hospital Stay: Payer: Medicare Other | Admitting: Nurse Practitioner

## 2022-01-31 ENCOUNTER — Inpatient Hospital Stay: Payer: Medicare Other | Attending: Oncology

## 2022-01-31 ENCOUNTER — Other Ambulatory Visit: Payer: Self-pay

## 2022-01-31 DIAGNOSIS — Z452 Encounter for adjustment and management of vascular access device: Secondary | ICD-10-CM | POA: Diagnosis not present

## 2022-01-31 DIAGNOSIS — Z95828 Presence of other vascular implants and grafts: Secondary | ICD-10-CM

## 2022-01-31 DIAGNOSIS — C3411 Malignant neoplasm of upper lobe, right bronchus or lung: Secondary | ICD-10-CM

## 2022-01-31 DIAGNOSIS — Z85118 Personal history of other malignant neoplasm of bronchus and lung: Secondary | ICD-10-CM | POA: Insufficient documentation

## 2022-01-31 MED ORDER — HEPARIN SOD (PORK) LOCK FLUSH 100 UNIT/ML IV SOLN
500.0000 [IU] | Freq: Once | INTRAVENOUS | Status: AC
  Administered 2022-01-31: 500 [IU]

## 2022-01-31 MED ORDER — SODIUM CHLORIDE 0.9% FLUSH
10.0000 mL | Freq: Once | INTRAVENOUS | Status: AC
  Administered 2022-01-31: 10 mL

## 2022-01-31 NOTE — Progress Notes (Signed)
Pt MD appointment was cancelled after flush appointment.  ?

## 2022-02-07 ENCOUNTER — Other Ambulatory Visit: Payer: Self-pay

## 2022-02-07 ENCOUNTER — Encounter (HOSPITAL_BASED_OUTPATIENT_CLINIC_OR_DEPARTMENT_OTHER): Payer: Self-pay

## 2022-02-07 ENCOUNTER — Ambulatory Visit (HOSPITAL_BASED_OUTPATIENT_CLINIC_OR_DEPARTMENT_OTHER)
Admission: RE | Admit: 2022-02-07 | Discharge: 2022-02-07 | Disposition: A | Discharge status: Home / Self Care | Payer: Medicare Other | Source: Ambulatory Visit | Attending: Oncology | Admitting: Oncology

## 2022-02-07 DIAGNOSIS — J432 Centrilobular emphysema: Secondary | ICD-10-CM | POA: Diagnosis not present

## 2022-02-07 DIAGNOSIS — C3411 Malignant neoplasm of upper lobe, right bronchus or lung: Secondary | ICD-10-CM | POA: Insufficient documentation

## 2022-02-07 DIAGNOSIS — R918 Other nonspecific abnormal finding of lung field: Secondary | ICD-10-CM | POA: Diagnosis not present

## 2022-02-07 DIAGNOSIS — J984 Other disorders of lung: Secondary | ICD-10-CM | POA: Diagnosis not present

## 2022-02-09 ENCOUNTER — Encounter: Payer: Self-pay | Admitting: Nurse Practitioner

## 2022-02-09 ENCOUNTER — Other Ambulatory Visit: Payer: Self-pay

## 2022-02-09 ENCOUNTER — Inpatient Hospital Stay (HOSPITAL_BASED_OUTPATIENT_CLINIC_OR_DEPARTMENT_OTHER): Payer: Medicare Other | Admitting: Nurse Practitioner

## 2022-02-09 VITALS — BP 150/70 | HR 90 | Temp 98.1°F | Resp 20 | Ht 62.0 in | Wt 125.4 lb

## 2022-02-09 DIAGNOSIS — C3411 Malignant neoplasm of upper lobe, right bronchus or lung: Secondary | ICD-10-CM

## 2022-02-09 DIAGNOSIS — Z452 Encounter for adjustment and management of vascular access device: Secondary | ICD-10-CM | POA: Diagnosis not present

## 2022-02-09 DIAGNOSIS — Z85118 Personal history of other malignant neoplasm of bronchus and lung: Secondary | ICD-10-CM | POA: Diagnosis not present

## 2022-02-09 NOTE — Progress Notes (Signed)
?[facility] ?OFFICE PROGRESS NOTE ? ? ?Diagnosis: Non-small cell lung cancer, appendiceal cancer ? ?INTERVAL HISTORY:  ? ?Melissa Golden returns as scheduled.  No change in baseline cough and dyspnea.  No fever.  She has a good appetite.  No change in bowel habits. ? ?She reports having COVID/pneumonia November 2022. ? ?Objective: ? ?Vital signs in last 24 hours: ? ?Blood pressure (!) 150/70, pulse 90, temperature 98.1 ?F (36.7 ?C), temperature source Oral, resp. rate 20, height $RemoveBe'5\' 2"'fQDNqPdBz$  (1.575 m), weight 125 lb 6.4 oz (56.9 kg), SpO2 98 %. ?  ? ?Lymphatics: No palpable cervical, supraclavicular, axillary or inguinal lymph nodes. ?Resp: Distant breath sounds.  No respiratory distress. ?Cardio: Regular rate and rhythm. ?GI: Abdomen soft and nontender.  No hepatosplenomegaly. ?Vascular: No leg edema. ?Port-A-Cath without erythema. ? ?Lab Results: ? ?Lab Results  ?Component Value Date  ? WBC 4.8 07/19/2021  ? HGB 12.6 07/19/2021  ? HCT 38.2 07/19/2021  ? MCV 95.3 07/19/2021  ? PLT 138 (L) 07/19/2021  ? NEUTROABS 3.5 07/19/2021  ? ? ?Imaging: ? ?No results found. ? ?Medications: I have reviewed the patient's current medications. ? ?Assessment/Plan: ?Adenocarcinoma/goblet cell carcinoid of the appendix ?CT scan of the abdomen/pelvis on 09/12/2017 - fluid-filled hypervascular appendix with mild reactive enteritis within the ileum.   ?Status post laparoscopic appendectomy 09/12/2017.  Postoperative course complicated by an ileus requiring TPN.   ?Final pathology of the appendix-5 cm adenocarcinoma/goblet cell carcinoid.  The tumor extended through the muscularis propria into subserosal soft tissue and mesoappendix and focally to the serosal surface.  Lymphovascular space invasion identified.  Perineural invasion identified.  Associated acute appendicitis with perforation. ?10/15/2017 CEA 1. ?11/22/2017 chromogranin A-3 ?Status post laparoscopic right hemicolectomy by Dr. Cecil Cobbs at [facility] on 12/07/2017.  No  carcinomatosis or liver metastases were seen.   ?Final pathology showed metastatic adenocarcinoma/goblet cell carcinoid in 3 of 15 pericolic lymph nodes; maximum size of largest metastasis 0.2 cm; positive for extracapsular extension.  Benign terminal ileum with fibrous serosal adhesions, negative for dysplasia or carcinoma; surgically absent appendix; appendectomy site with postsurgical changes and fibrous serosal adhesions, negative for carcinoma; proximal and distal mucosal margins negative for dysplasia or carcinoma; mesenteric resection margin negative for carcinoma. ?Cycle 1 adjuvant Xeloda 01/07/2018 ?Cycle 2 adjuvant Xeloda 01/28/2018 ?Non-small cell lung cancer ?PET scan 63/33/5456-2 hypermetabolic right upper lobe pulmonary nodules increased in size and metabolic activity.  No mediastinal nodal metastasis.  4 mm right upper lobe nodule with metabolic activity, SUV 3.6.  A third right upper lobe nodule measures 4 mm and has faint metabolic activity.  No clear evidence of malignancy in the abdomen or pelvis.  Mild activity along the ventral abdominal surgical scar.  Scattered activity in small bowel without focal lesion on CT. ?CT biopsy right upper lobe nodule 11/02/2017 showed non-small cell carcinoma; malignant cells positive for TTF-1 and negative for p63.  PD-L1 95%. ?Chest CT 01/21/2018-new mediastinal adenopathy seen in the prevascular and right paratracheal stations.  Largest lymph node is seen in the prevascular space measuring 2.0 cm, previously 6 mm.  No hilar or axillary adenopathy.  Right lung nodules are stable. ?Biopsy of a right paratracheal lymph node on 02/04/2018-metastatic adenocarcinoma consistent with a lung primary ?Chest radiation started 03/11/2018 ?Week 1 Taxol/carboplatin 03/13/2018 ?Week 2 Taxol/carboplatin 03/20/2018 ?Week 3 Taxol/carboplatin 03/27/2018 ?Week  4Taxol/carboplatin 04/03/2018 ?Week 5 Taxol/carboplatin 04/10/2018 ?Week 6 Taxol/carboplatin 04/19/2018 ?Chest radiation completed  04/25/2018 ?CT chest 05/23/2018-interval decrease in size of mediastinal adenopathy.  Near complete resolution  of previously visualized right pleural effusion.  The right lung nodules have decreased in size.  Other nodules are stable. ?Cycle 1 durvalumab 05/23/2018 ?Cycle 2 durvalumab 06/06/2018 ?Cycle 3 durvalumab 06/20/2018 ?Cycle 4 durvalumab 07/04/2018 ?Cycle 5 durvalumab 07/19/2018 ?Cycle 6 durvalumab 08/01/2018 ?Cycle 7 durvalumab 08/15/2018 ?CT chest 09/03/2018- collapse/consolidation with bronchiectasis and surrounding groundglass involving primarily the right upper lobe, findings most indicative of recent radiation therapy. Previously measured right upper lobe nodule obscured.  New areas of nodularity in the right lower lobe, likely infectious or inflammatory. ?Cycle 8 durvalumab 09/04/2018 ?Cycle 9 durvalumab 09/18/2018 ?Cycle 10 durvalumab 10/02/2018 ?Cycle 11 durvalumab 10/16/2018  ?Cycle 12 durvalumab 11/13/2018 ?Cycle 13 durvalumab 11/30/2018 ?Cycle 14 durvalumab 12/11/2018 ?CT chest 12/16/2018 [facility])- no mediastinal, axillary or hilar adenopathy.  Centrilobular emphysema.  Right apical volume loss with bronchiectasis traversing the area of atelectatic lung.  Elevation of the adjacent minor fissure due to volume loss.  Subpleural left upper lobe opacity, nonspecific, measuring 8.7 mm.  Compression fractures T5 and T7. ?Cycle 15 durvalumab 12/25/2018 ?Cycle 16 durvalumab 01/29/2019 ?Cycle 17 durvalumab 02/12/2019 ?Cycle 18 durvalumab 02/26/2019 ?Cycle 19 durvalumab 03/12/2019 ?Cycle 20 durvalumab 03/26/2019 ?Cycle 21 durvalumab 04/16/2019 ?Cycle 22 durvalumab 04/30/2019 ?CT chest 05/12/2019- right lung radiation fibrosis, no evidence of local tumor recurrence, no evidence of metastatic disease in the chest, stable pulmonary nodules ?Cycle 23 durvalumab 05/14/2019 ?CT chest 10/13/2019-stable radiation changes in the right lung, new nodules in the right lower lobe, left lower lobe, and lingula-favored to represent an  inflammatory process ?CT chest 12/30/2019-no findings to suggest recurrent or metastatic disease, subtle groundglass at the site of previous nodularity in the right lung base almost completely resolved ?CT chest 06/17/2020-6 mm left lower lobe nodule-more conspicuous, stable compression deformities at T5 and T7 ?CT chest 10/04/2020-nodular density medial left lower lobe unchanged.  Adjacent peribronchovascular groundglass and volume loss have increased slightly and are likely infectious or inflammatory. ?CT chest 04/28/2021-new interstitial and nodular opacities in the right upper lobe laterally, favoring pneumonia, tumor and lymphangitic spread considered less likely.  Mild increase in loculated right pleural effusion.  Mild increase in atelectasis or nodular scarring peripherally at the left lung base.  Stable compression fractures at T5 and T7. ?CT chest 07/19/2021-previously described patchy nodular foci of consolidation in the upper right lung and left lung base have resolved compatible with resolved multilobar pneumonia.  Stable radiation fibrosis upper right lung with no evidence of local tumor recurrence.  No findings suspicious for metastatic disease in the chest. ?CT chest 02/07/2022-interval development of bilateral irregular pulmonary nodules right greater than left. ?  ?  ?COPD ?  ?4.   VATS drainage of a loculated right pleural effusion 03/01/2018 ?  ?5.  Port-A-Cath placement 05/31/2018 ?  ?6.  Severe mid back pain 10/30/2018-CT thoracic spine- T5 compression fracture new since 09/03/2018- appearance consistent with an osteoporotic fracture.  Pain improved.  Increased back pain January 2020.  CT scan 12/16/2018- new compression fractures T5 and T7.  T5 vertebral body somewhat sclerotic in appearance.  MRI thoracic spine 01/08/2019-acute compression fractures involving the T5 and T7 vertebral bodies stable relative to recent CT with up to 50% height loss at T5 and 25% height loss at T7.  No associated bony  retropulsion.  Benign/mechanical features by MRI with no appreciable underlying pathologic lesion.  No other evidence for metastatic disease within the thoracic spine.  ?  ?  ?  ? ?Disposition: Ms. Held appears unchanged.  The r

## 2022-02-10 DIAGNOSIS — F419 Anxiety disorder, unspecified: Secondary | ICD-10-CM | POA: Diagnosis not present

## 2022-02-10 DIAGNOSIS — R0902 Hypoxemia: Secondary | ICD-10-CM | POA: Diagnosis not present

## 2022-02-10 DIAGNOSIS — Z87891 Personal history of nicotine dependence: Secondary | ICD-10-CM | POA: Diagnosis not present

## 2022-02-10 DIAGNOSIS — Z6822 Body mass index (BMI) 22.0-22.9, adult: Secondary | ICD-10-CM | POA: Diagnosis not present

## 2022-02-10 DIAGNOSIS — J440 Chronic obstructive pulmonary disease with acute lower respiratory infection: Secondary | ICD-10-CM | POA: Diagnosis not present

## 2022-02-10 DIAGNOSIS — C3411 Malignant neoplasm of upper lobe, right bronchus or lung: Secondary | ICD-10-CM | POA: Diagnosis not present

## 2022-03-02 DIAGNOSIS — Z20822 Contact with and (suspected) exposure to covid-19: Secondary | ICD-10-CM | POA: Diagnosis not present

## 2022-03-16 DIAGNOSIS — Z20822 Contact with and (suspected) exposure to covid-19: Secondary | ICD-10-CM | POA: Diagnosis not present

## 2022-03-27 DIAGNOSIS — Z20822 Contact with and (suspected) exposure to covid-19: Secondary | ICD-10-CM | POA: Diagnosis not present

## 2022-04-03 DIAGNOSIS — Z20822 Contact with and (suspected) exposure to covid-19: Secondary | ICD-10-CM | POA: Diagnosis not present

## 2022-04-06 ENCOUNTER — Inpatient Hospital Stay: Payer: Medicare Other | Attending: Oncology

## 2022-04-06 VITALS — BP 131/80 | HR 88 | Temp 98.2°F | Resp 18

## 2022-04-06 DIAGNOSIS — Z85118 Personal history of other malignant neoplasm of bronchus and lung: Secondary | ICD-10-CM | POA: Diagnosis not present

## 2022-04-06 DIAGNOSIS — C3411 Malignant neoplasm of upper lobe, right bronchus or lung: Secondary | ICD-10-CM

## 2022-04-06 DIAGNOSIS — Z452 Encounter for adjustment and management of vascular access device: Secondary | ICD-10-CM | POA: Insufficient documentation

## 2022-04-06 DIAGNOSIS — Z95828 Presence of other vascular implants and grafts: Secondary | ICD-10-CM

## 2022-04-06 MED ORDER — HEPARIN SOD (PORK) LOCK FLUSH 100 UNIT/ML IV SOLN
500.0000 [IU] | Freq: Once | INTRAVENOUS | Status: AC
  Administered 2022-04-06: 500 [IU]

## 2022-04-06 MED ORDER — SODIUM CHLORIDE 0.9% FLUSH
10.0000 mL | Freq: Once | INTRAVENOUS | Status: AC
  Administered 2022-04-06: 10 mL

## 2022-05-01 DIAGNOSIS — J189 Pneumonia, unspecified organism: Secondary | ICD-10-CM | POA: Diagnosis not present

## 2022-05-03 DIAGNOSIS — Z20822 Contact with and (suspected) exposure to covid-19: Secondary | ICD-10-CM | POA: Diagnosis not present

## 2022-05-03 DIAGNOSIS — Z79899 Other long term (current) drug therapy: Secondary | ICD-10-CM | POA: Diagnosis not present

## 2022-05-03 DIAGNOSIS — C3490 Malignant neoplasm of unspecified part of unspecified bronchus or lung: Secondary | ICD-10-CM | POA: Diagnosis not present

## 2022-05-03 DIAGNOSIS — E876 Hypokalemia: Secondary | ICD-10-CM | POA: Diagnosis not present

## 2022-05-03 DIAGNOSIS — Z923 Personal history of irradiation: Secondary | ICD-10-CM | POA: Diagnosis not present

## 2022-05-03 DIAGNOSIS — Z87891 Personal history of nicotine dependence: Secondary | ICD-10-CM | POA: Diagnosis not present

## 2022-05-03 DIAGNOSIS — J441 Chronic obstructive pulmonary disease with (acute) exacerbation: Secondary | ICD-10-CM | POA: Diagnosis not present

## 2022-05-03 DIAGNOSIS — J9620 Acute and chronic respiratory failure, unspecified whether with hypoxia or hypercapnia: Secondary | ICD-10-CM | POA: Diagnosis not present

## 2022-05-03 DIAGNOSIS — R531 Weakness: Secondary | ICD-10-CM | POA: Diagnosis not present

## 2022-05-03 DIAGNOSIS — J9610 Chronic respiratory failure, unspecified whether with hypoxia or hypercapnia: Secondary | ICD-10-CM | POA: Diagnosis not present

## 2022-05-03 DIAGNOSIS — Z792 Long term (current) use of antibiotics: Secondary | ICD-10-CM | POA: Diagnosis not present

## 2022-05-03 DIAGNOSIS — J90 Pleural effusion, not elsewhere classified: Secondary | ICD-10-CM | POA: Diagnosis not present

## 2022-05-03 DIAGNOSIS — Z9981 Dependence on supplemental oxygen: Secondary | ICD-10-CM | POA: Diagnosis not present

## 2022-05-03 DIAGNOSIS — R059 Cough, unspecified: Secondary | ICD-10-CM | POA: Diagnosis not present

## 2022-05-03 DIAGNOSIS — C3411 Malignant neoplasm of upper lobe, right bronchus or lung: Secondary | ICD-10-CM | POA: Diagnosis not present

## 2022-05-04 DIAGNOSIS — J441 Chronic obstructive pulmonary disease with (acute) exacerbation: Secondary | ICD-10-CM | POA: Diagnosis not present

## 2022-05-04 DIAGNOSIS — E876 Hypokalemia: Secondary | ICD-10-CM | POA: Diagnosis not present

## 2022-05-04 DIAGNOSIS — C3490 Malignant neoplasm of unspecified part of unspecified bronchus or lung: Secondary | ICD-10-CM | POA: Diagnosis not present

## 2022-05-04 DIAGNOSIS — J9620 Acute and chronic respiratory failure, unspecified whether with hypoxia or hypercapnia: Secondary | ICD-10-CM | POA: Diagnosis not present

## 2022-05-04 DIAGNOSIS — Z79899 Other long term (current) drug therapy: Secondary | ICD-10-CM | POA: Diagnosis not present

## 2022-05-05 DIAGNOSIS — J9620 Acute and chronic respiratory failure, unspecified whether with hypoxia or hypercapnia: Secondary | ICD-10-CM | POA: Diagnosis not present

## 2022-05-05 DIAGNOSIS — E876 Hypokalemia: Secondary | ICD-10-CM | POA: Diagnosis not present

## 2022-05-05 DIAGNOSIS — C3411 Malignant neoplasm of upper lobe, right bronchus or lung: Secondary | ICD-10-CM | POA: Diagnosis not present

## 2022-05-05 DIAGNOSIS — J441 Chronic obstructive pulmonary disease with (acute) exacerbation: Secondary | ICD-10-CM | POA: Diagnosis not present

## 2022-05-05 DIAGNOSIS — Z9981 Dependence on supplemental oxygen: Secondary | ICD-10-CM | POA: Diagnosis not present

## 2022-05-15 ENCOUNTER — Telehealth: Payer: Self-pay | Admitting: *Deleted

## 2022-05-15 NOTE — Telephone Encounter (Signed)
Notified patient of CT scan on 7/6 at 0830 with arrival at 0815-no prep.

## 2022-06-01 ENCOUNTER — Encounter (HOSPITAL_BASED_OUTPATIENT_CLINIC_OR_DEPARTMENT_OTHER): Payer: Self-pay

## 2022-06-01 ENCOUNTER — Ambulatory Visit (HOSPITAL_BASED_OUTPATIENT_CLINIC_OR_DEPARTMENT_OTHER)
Admission: RE | Admit: 2022-06-01 | Discharge: 2022-06-01 | Disposition: A | Discharge status: Home / Self Care | Payer: Medicare Other | Source: Ambulatory Visit | Attending: Nurse Practitioner | Admitting: Nurse Practitioner

## 2022-06-01 ENCOUNTER — Inpatient Hospital Stay (HOSPITAL_BASED_OUTPATIENT_CLINIC_OR_DEPARTMENT_OTHER): Payer: Medicare Other | Admitting: Oncology

## 2022-06-01 ENCOUNTER — Inpatient Hospital Stay: Payer: Medicare Other | Attending: Oncology

## 2022-06-01 VITALS — BP 119/66 | HR 82 | Temp 98.2°F | Resp 18 | Ht 62.0 in | Wt 128.0 lb

## 2022-06-01 DIAGNOSIS — R911 Solitary pulmonary nodule: Secondary | ICD-10-CM | POA: Diagnosis not present

## 2022-06-01 DIAGNOSIS — Z9221 Personal history of antineoplastic chemotherapy: Secondary | ICD-10-CM | POA: Diagnosis not present

## 2022-06-01 DIAGNOSIS — Z85118 Personal history of other malignant neoplasm of bronchus and lung: Secondary | ICD-10-CM | POA: Diagnosis not present

## 2022-06-01 DIAGNOSIS — C3411 Malignant neoplasm of upper lobe, right bronchus or lung: Secondary | ICD-10-CM | POA: Insufficient documentation

## 2022-06-01 DIAGNOSIS — Z8509 Personal history of malignant neoplasm of other digestive organs: Secondary | ICD-10-CM | POA: Diagnosis present

## 2022-06-01 DIAGNOSIS — R918 Other nonspecific abnormal finding of lung field: Secondary | ICD-10-CM | POA: Diagnosis not present

## 2022-06-01 DIAGNOSIS — C3490 Malignant neoplasm of unspecified part of unspecified bronchus or lung: Secondary | ICD-10-CM | POA: Diagnosis not present

## 2022-06-01 DIAGNOSIS — J439 Emphysema, unspecified: Secondary | ICD-10-CM | POA: Diagnosis not present

## 2022-06-01 DIAGNOSIS — Z923 Personal history of irradiation: Secondary | ICD-10-CM | POA: Diagnosis not present

## 2022-06-01 DIAGNOSIS — Z95828 Presence of other vascular implants and grafts: Secondary | ICD-10-CM

## 2022-06-01 MED ORDER — SODIUM CHLORIDE 0.9% FLUSH
10.0000 mL | Freq: Once | INTRAVENOUS | Status: AC
  Administered 2022-06-01: 10 mL

## 2022-06-01 MED ORDER — HEPARIN SOD (PORK) LOCK FLUSH 100 UNIT/ML IV SOLN
500.0000 [IU] | Freq: Once | INTRAVENOUS | Status: AC
  Administered 2022-06-01: 500 [IU]

## 2022-06-01 NOTE — Progress Notes (Signed)
[facility] OFFICE PROGRESS NOTE   Diagnosis: Non-small cell lung cancer  INTERVAL HISTORY:   Melissa Golden comes as scheduled.  He is admitted to the [facility] in [place] 05/03/2022 with a COPD exacerbation.  She reports having a cough and increased dyspnea.  She was discharged 05/05/2022.  She was treated with azithromycin and prednisone.    Objective:  Vital signs in last 24 hours:  Blood pressure 119/66, pulse 82, temperature 98.2 F (36.8 C), temperature source Oral, resp. rate 18, height 5' 2" (1.575 m), weight 128 lb (58.1 kg), SpO2 98 %.    Lymphatics: No cervical, supraclavicular, or axillary nodes Resp: Lungs clear bilaterally Cardio: Regular rate and rhythm GI: No hepatosplenomegaly Vascular: No leg edema  Portacath/PICC-without erythema  Lab Results:  Lab Results  Component Value Date   WBC 4.8 07/19/2021   HGB 12.6 07/19/2021   HCT 38.2 07/19/2021   MCV 95.3 07/19/2021   PLT 138 (L) 07/19/2021   NEUTROABS 3.5 07/19/2021    CMP  Lab Results  Component Value Date   NA 139 07/19/2021   K 4.2 07/19/2021   CL 104 07/19/2021   CO2 29 07/19/2021   GLUCOSE 95 07/19/2021   BUN 6 (L) 07/19/2021   CREATININE 0.41 (L) 07/19/2021   CALCIUM 9.0 07/19/2021   PROT 6.9 07/19/2021   ALBUMIN 3.8 07/19/2021   AST 16 07/19/2021   ALT 10 07/19/2021   ALKPHOS 63 07/19/2021   BILITOT 0.5 07/19/2021   GFRNONAA >60 07/19/2021   GFRAA >60 06/17/2020    Lab Results  Component Value Date   CEA1 2.44 04/19/2020    Lab Results  Component Value Date   INR 0.89 05/31/2018   LABPROT 12.0 05/31/2018    Imaging:  CT Chest Wo Contrast  Result Date: 06/01/2022 CLINICAL DATA:  Follow-up lung cancer, lung nodules, additional history of colon cancer, former smoker * Tracking Code: BO * EXAM: CT CHEST WITHOUT CONTRAST TECHNIQUE: Multidetector CT imaging of the chest was performed following the standard protocol without IV contrast. RADIATION DOSE REDUCTION: This  exam was performed according to the departmental dose-optimization program which includes automated exposure control, adjustment of the mA and/or kV according to patient size and/or use of iterative reconstruction technique. COMPARISON:  02/07/2022 FINDINGS: Cardiovascular: Right chest port catheter. Aortic atherosclerosis. Unchanged fusiform enlargement of the distal descending and proximal abdominal aorta, measuring up to 3.2 x 3.0 cm at the diaphragmatic hiatus (series 2, image 123). Normal heart size. Left and right coronary artery calcifications. No pericardial effusion. Mediastinum/Nodes: No enlarged mediastinal, hilar, or axillary lymph nodes. Thyroid gland, trachea, and esophagus demonstrate no significant findings. Lungs/Pleura: Moderate to severe centrilobular emphysema. Unchanged post treatment appearance of the right chest with dense fibrosis and consolidation of the perihilar lung and paramedian right upper lobe (series 4, image 32). Numerous bilateral irregular nodules and ground-glass opacities in the bilateral lung bases are significantly improved, in particular many in the right lung base are fully resolved (series 4, image 101). Others are somewhat fluctuant. Small, loculated component of pleural fluid about the peripheral right lung (series 2, image 39). Upper Abdomen: No acute abnormality. Musculoskeletal: No chest wall abnormality. Unchanged wedge deformities of T5 and T7. IMPRESSION: 1. Unchanged post treatment appearance of the right chest with dense fibrosis and consolidation of the perihilar lung and paramedian right upper lobe. 2. Numerous bilateral irregular nodules and ground-glass opacities in the bilateral lung bases are significantly improved, in particular many in the right lung base  are fully resolved. Others are somewhat fluctuant. Findings are consistent with overall improved, although possibly still ongoing infection or aspiration. 3. Emphysema. 4. Coronary artery disease. 5.  Unchanged fusiform enlargement of the distal descending and proximal abdominal aorta, measuring up to 3.2 x 3.0 cm at the diaphragmatic hiatus. Recommend annual imaging followup by CTA or MRA if not otherwise imaged. This recommendation follows 2010 ACCF/AHA/AATS/ACR/ASA/SCA/SCAI/SIR/STS/SVM Guidelines for the Diagnosis and Management of Patients with Thoracic Aortic Disease. Circulation.2010; 121: B151-V616. Aortic aneurysm NOS (ICD10-I71.9) Aortic Atherosclerosis (ICD10-I70.0) and Emphysema (ICD10-J43.9). Electronically Signed   By: Delanna Ahmadi M.D.   On: 06/01/2022 08:54    Medications: I have reviewed the patient's current medications.   Assessment/Plan: Adenocarcinoma/goblet cell carcinoid of the appendix CT scan of the abdomen/pelvis on 09/12/2017 - fluid-filled hypervascular appendix with mild reactive enteritis within the ileum.   Status post laparoscopic appendectomy 09/12/2017.  Postoperative course complicated by an ileus requiring TPN.   Final pathology of the appendix-5 cm adenocarcinoma/goblet cell carcinoid.  The tumor extended through the muscularis propria into subserosal soft tissue and mesoappendix and focally to the serosal surface.  Lymphovascular space invasion identified.  Perineural invasion identified.  Associated acute appendicitis with perforation. 10/15/2017 CEA 1. 11/22/2017 chromogranin A-3 Status post laparoscopic right hemicolectomy by Dr. Cecil Cobbs at [facility] on 12/07/2017.  No carcinomatosis or liver metastases were seen.   Final pathology showed metastatic adenocarcinoma/goblet cell carcinoid in 3 of 15 pericolic lymph nodes; maximum size of largest metastasis 0.2 cm; positive for extracapsular extension.  Benign terminal ileum with fibrous serosal adhesions, negative for dysplasia or carcinoma; surgically absent appendix; appendectomy site with postsurgical changes and fibrous serosal adhesions, negative for carcinoma; proximal and distal mucosal margins negative for  dysplasia or carcinoma; mesenteric resection margin negative for carcinoma. Cycle 1 adjuvant Xeloda 01/07/2018 Cycle 2 adjuvant Xeloda 01/28/2018 Non-small cell lung cancer PET scan 07/37/1062-6 hypermetabolic right upper lobe pulmonary nodules increased in size and metabolic activity.  No mediastinal nodal metastasis.  4 mm right upper lobe nodule with metabolic activity, SUV 3.6.  A third right upper lobe nodule measures 4 mm and has faint metabolic activity.  No clear evidence of malignancy in the abdomen or pelvis.  Mild activity along the ventral abdominal surgical scar.  Scattered activity in small bowel without focal lesion on CT. CT biopsy right upper lobe nodule 11/02/2017 showed non-small cell carcinoma; malignant cells positive for TTF-1 and negative for p63.  PD-L1 95%. Chest CT 01/21/2018-new mediastinal adenopathy seen in the prevascular and right paratracheal stations.  Largest lymph node is seen in the prevascular space measuring 2.0 cm, previously 6 mm.  No hilar or axillary adenopathy.  Right lung nodules are stable. Biopsy of a right paratracheal lymph node on 02/04/2018-metastatic adenocarcinoma consistent with a lung primary Chest radiation started 03/11/2018 Week 1 Taxol/carboplatin 03/13/2018 Week 2 Taxol/carboplatin 03/20/2018 Week 3 Taxol/carboplatin 03/27/2018 Week  4Taxol/carboplatin 04/03/2018 Week 5 Taxol/carboplatin 04/10/2018 Week 6 Taxol/carboplatin 04/19/2018 Chest radiation completed 04/25/2018 CT chest 05/23/2018-interval decrease in size of mediastinal adenopathy.  Near complete resolution of previously visualized right pleural effusion.  The right lung nodules have decreased in size.  Other nodules are stable. Cycle 1 durvalumab 05/23/2018 Cycle 2 durvalumab 06/06/2018 Cycle 3 durvalumab 06/20/2018 Cycle 4 durvalumab 07/04/2018 Cycle 5 durvalumab 07/19/2018 Cycle 6 durvalumab 08/01/2018 Cycle 7 durvalumab 08/15/2018 CT chest 09/03/2018- collapse/consolidation with bronchiectasis and  surrounding groundglass involving primarily the right upper lobe, findings most indicative of recent radiation therapy. Previously measured right upper lobe nodule obscured.  New  areas of nodularity in the right lower lobe, likely infectious or inflammatory. Cycle 8 durvalumab 09/04/2018 Cycle 9 durvalumab 09/18/2018 Cycle 10 durvalumab 10/02/2018 Cycle 11 durvalumab 10/16/2018  Cycle 12 durvalumab 11/13/2018 Cycle 13 durvalumab 11/30/2018 Cycle 14 durvalumab 12/11/2018 CT chest 12/16/2018 [facility])- no mediastinal, axillary or hilar adenopathy.  Centrilobular emphysema.  Right apical volume loss with bronchiectasis traversing the area of atelectatic lung.  Elevation of the adjacent minor fissure due to volume loss.  Subpleural left upper lobe opacity, nonspecific, measuring 8.7 mm.  Compression fractures T5 and T7. Cycle 15 durvalumab 12/25/2018 Cycle 16 durvalumab 01/29/2019 Cycle 17 durvalumab 02/12/2019 Cycle 18 durvalumab 02/26/2019 Cycle 19 durvalumab 03/12/2019 Cycle 20 durvalumab 03/26/2019 Cycle 21 durvalumab 04/16/2019 Cycle 22 durvalumab 04/30/2019 CT chest 05/12/2019- right lung radiation fibrosis, no evidence of local tumor recurrence, no evidence of metastatic disease in the chest, stable pulmonary nodules Cycle 23 durvalumab 05/14/2019 CT chest 10/13/2019-stable radiation changes in the right lung, new nodules in the right lower lobe, left lower lobe, and lingula-favored to represent an inflammatory process CT chest 12/30/2019-no findings to suggest recurrent or metastatic disease, subtle groundglass at the site of previous nodularity in the right lung base almost completely resolved CT chest 06/17/2020-6 mm left lower lobe nodule-more conspicuous, stable compression deformities at T5 and T7 CT chest 10/04/2020-nodular density medial left lower lobe unchanged.  Adjacent peribronchovascular groundglass and volume loss have increased slightly and are likely infectious or inflammatory. CT chest  04/28/2021-new interstitial and nodular opacities in the right upper lobe laterally, favoring pneumonia, tumor and lymphangitic spread considered less likely.  Mild increase in loculated right pleural effusion.  Mild increase in atelectasis or nodular scarring peripherally at the left lung base.  Stable compression fractures at T5 and T7. CT chest 07/19/2021-previously described patchy nodular foci of consolidation in the upper right lung and left lung base have resolved compatible with resolved multilobar pneumonia.  Stable radiation fibrosis upper right lung with no evidence of local tumor recurrence.  No findings suspicious for metastatic disease in the chest. CT chest 02/07/2022-interval development of bilateral irregular pulmonary nodules right greater than left. CT chest 06/01/2022-unchanged posttreatment appearance of the right chest, irregular nodules and groundglass opacities in the bilateral lung bases have improved, emphysema, enlargement the distal descending and proximal abdominal aorta     COPD   4.   VATS drainage of a loculated right pleural effusion 03/01/2018   5.  Port-A-Cath placement 05/31/2018   6.  Severe mid back pain 10/30/2018-CT thoracic spine- T5 compression fracture new since 09/03/2018- appearance consistent with an osteoporotic fracture.  Pain improved.  Increased back pain January 2020.  CT scan 12/16/2018- new compression fractures T5 and T7.  T5 vertebral body somewhat sclerotic in appearance.  MRI thoracic spine 01/08/2019-acute compression fractures involving the T5 and T7 vertebral bodies stable relative to recent CT with up to 50% height loss at T5 and 25% height loss at T7.  No associated bony retropulsion.  Benign/mechanical features by MRI with no appreciable underlying pathologic lesion.  No other evidence for metastatic disease within the thoracic spine.        Disposition: Melissa Golden remains in clinical remission from non-small cell lung cancer.  She would like to keep  the Port-A-Cath in place.  She will return for Port-A-Cath flush in 6 weeks and an office visit in 12 weeks.  We will plan for a restaging chest CT at a 4-72-monthinterval.  GBetsy Coder MD  06/01/2022  10:13 AM

## 2022-06-01 NOTE — Patient Instructions (Signed)

## 2022-06-04 DIAGNOSIS — E876 Hypokalemia: Secondary | ICD-10-CM | POA: Diagnosis not present

## 2022-06-04 DIAGNOSIS — C3411 Malignant neoplasm of upper lobe, right bronchus or lung: Secondary | ICD-10-CM | POA: Diagnosis not present

## 2022-06-04 DIAGNOSIS — J9620 Acute and chronic respiratory failure, unspecified whether with hypoxia or hypercapnia: Secondary | ICD-10-CM | POA: Diagnosis not present

## 2022-06-19 ENCOUNTER — Other Ambulatory Visit: Payer: Self-pay

## 2022-06-22 DIAGNOSIS — I714 Abdominal aortic aneurysm, without rupture, unspecified: Secondary | ICD-10-CM | POA: Diagnosis not present

## 2022-06-22 DIAGNOSIS — Z6823 Body mass index (BMI) 23.0-23.9, adult: Secondary | ICD-10-CM | POA: Diagnosis not present

## 2022-06-22 DIAGNOSIS — R030 Elevated blood-pressure reading, without diagnosis of hypertension: Secondary | ICD-10-CM | POA: Diagnosis not present

## 2022-06-27 ENCOUNTER — Other Ambulatory Visit: Payer: Self-pay

## 2022-07-11 DIAGNOSIS — Z6823 Body mass index (BMI) 23.0-23.9, adult: Secondary | ICD-10-CM | POA: Diagnosis not present

## 2022-07-11 DIAGNOSIS — F419 Anxiety disorder, unspecified: Secondary | ICD-10-CM | POA: Diagnosis not present

## 2022-07-11 DIAGNOSIS — I714 Abdominal aortic aneurysm, without rupture, unspecified: Secondary | ICD-10-CM | POA: Diagnosis not present

## 2022-07-11 DIAGNOSIS — R0902 Hypoxemia: Secondary | ICD-10-CM | POA: Diagnosis not present

## 2022-07-11 DIAGNOSIS — L723 Sebaceous cyst: Secondary | ICD-10-CM | POA: Diagnosis not present

## 2022-07-11 DIAGNOSIS — R030 Elevated blood-pressure reading, without diagnosis of hypertension: Secondary | ICD-10-CM | POA: Diagnosis not present

## 2022-07-11 DIAGNOSIS — C3411 Malignant neoplasm of upper lobe, right bronchus or lung: Secondary | ICD-10-CM | POA: Diagnosis not present

## 2022-07-11 DIAGNOSIS — Z87891 Personal history of nicotine dependence: Secondary | ICD-10-CM | POA: Diagnosis not present

## 2022-07-11 DIAGNOSIS — J440 Chronic obstructive pulmonary disease with acute lower respiratory infection: Secondary | ICD-10-CM | POA: Diagnosis not present

## 2022-07-12 ENCOUNTER — Encounter: Payer: Self-pay | Admitting: *Deleted

## 2022-07-12 ENCOUNTER — Other Ambulatory Visit: Payer: Self-pay | Admitting: *Deleted

## 2022-07-12 NOTE — Patient Instructions (Signed)
Visit Information  Thank you for taking time to visit with me today. Please don't hesitate to contact me if I can be of assistance to you before our next scheduled telephone appointment.  Following are the goals we discussed today:  Schedule AWV if not already done.  Keep scheduled visits with oncology and pulmonology  Our next appointment is by telephone on 10/12  The patient has been provided with contact information for the care management team and has been advised to call with any health related questions or concerns.   Valente David, RN, MSN, [facility] Care Coordinator [phone]

## 2022-07-12 NOTE — Patient Outreach (Signed)
  Care Coordination   Initial Visit Note   07/12/2022 Name: Melissa Golden MRN: [id number] DOB: Jun 06, 1946  Melissa Golden is a 76 y.o. year old female who sees Burdine, Virgina Evener, MD for primary care. I spoke with  Melissa Golden by phone today  What matters to the patients health and wellness today?  Was hospitalized in June for pneumonia, has since recovered.  Working to keep COPD managed.  Closely followed by PCP and oncology for lung cancer.  Also having regular scans to monitor "enlarged aorta."  Aware of need for AWV, unsure if she has had one this year.  Will collaborate with PCP office.     Goals Addressed               This Visit's Progress     Management of COPD, no admissions (pt-stated)        Care Coordination Interventions: Provided patient with basic written and verbal COPD education on self care/management/and exacerbation prevention Provided instruction about proper use of medications used for management of COPD including inhalers Provided education about and advised patient to utilize infection prevention strategies to reduce risk of respiratory infection Discussed the importance of adequate rest and management of fatigue with COPD Assessed social determinant of health barriers Follows up with pulmonology twice a year.   Does not smoke, quit 6 years ago         SDOH assessments and interventions completed:  Yes  SDOH Interventions Today    Flowsheet Row Most Recent Value  SDOH Interventions   Food Insecurity Interventions Intervention Not Indicated  Housing Interventions Intervention Not Indicated  Transportation Interventions Intervention Not Indicated        Care Coordination Interventions Activated:  Yes  Care Coordination Interventions:  Yes, provided   Follow up plan: Follow up call scheduled for 10/12    Encounter Outcome:  Pt. Visit Completed   Valente David, RN, MSN, [place] Coordinator [phone]

## 2022-07-13 ENCOUNTER — Inpatient Hospital Stay: Payer: Medicare Other | Attending: Oncology

## 2022-07-13 DIAGNOSIS — Z452 Encounter for adjustment and management of vascular access device: Secondary | ICD-10-CM | POA: Insufficient documentation

## 2022-07-13 DIAGNOSIS — Z8509 Personal history of malignant neoplasm of other digestive organs: Secondary | ICD-10-CM | POA: Insufficient documentation

## 2022-07-13 DIAGNOSIS — Z85118 Personal history of other malignant neoplasm of bronchus and lung: Secondary | ICD-10-CM | POA: Insufficient documentation

## 2022-07-18 ENCOUNTER — Inpatient Hospital Stay: Payer: Medicare Other

## 2022-07-18 VITALS — BP 113/82 | HR 81 | Temp 98.2°F | Resp 18

## 2022-07-18 DIAGNOSIS — C3411 Malignant neoplasm of upper lobe, right bronchus or lung: Secondary | ICD-10-CM

## 2022-07-18 DIAGNOSIS — Z8509 Personal history of malignant neoplasm of other digestive organs: Secondary | ICD-10-CM | POA: Diagnosis not present

## 2022-07-18 DIAGNOSIS — Z452 Encounter for adjustment and management of vascular access device: Secondary | ICD-10-CM | POA: Diagnosis not present

## 2022-07-18 DIAGNOSIS — Z95828 Presence of other vascular implants and grafts: Secondary | ICD-10-CM

## 2022-07-18 DIAGNOSIS — Z85118 Personal history of other malignant neoplasm of bronchus and lung: Secondary | ICD-10-CM | POA: Diagnosis not present

## 2022-07-18 MED ORDER — HEPARIN SOD (PORK) LOCK FLUSH 100 UNIT/ML IV SOLN
500.0000 [IU] | Freq: Once | INTRAVENOUS | Status: AC
  Administered 2022-07-18: 500 [IU]

## 2022-07-18 MED ORDER — SODIUM CHLORIDE 0.9% FLUSH
10.0000 mL | Freq: Once | INTRAVENOUS | Status: AC
  Administered 2022-07-18: 10 mL

## 2022-07-18 NOTE — Patient Instructions (Signed)

## 2022-08-21 DIAGNOSIS — M720 Palmar fascial fibromatosis [Dupuytren]: Secondary | ICD-10-CM | POA: Diagnosis not present

## 2022-08-21 DIAGNOSIS — L720 Epidermal cyst: Secondary | ICD-10-CM | POA: Diagnosis not present

## 2022-08-24 ENCOUNTER — Encounter: Payer: Self-pay | Admitting: Nurse Practitioner

## 2022-08-24 ENCOUNTER — Inpatient Hospital Stay: Payer: Medicare Other | Attending: Oncology | Admitting: Nurse Practitioner

## 2022-08-24 ENCOUNTER — Inpatient Hospital Stay: Payer: Medicare Other

## 2022-08-24 VITALS — BP 129/75 | HR 91 | Temp 98.2°F | Resp 18 | Ht 62.0 in | Wt 131.4 lb

## 2022-08-24 DIAGNOSIS — Z85118 Personal history of other malignant neoplasm of bronchus and lung: Secondary | ICD-10-CM | POA: Diagnosis not present

## 2022-08-24 DIAGNOSIS — C3411 Malignant neoplasm of upper lobe, right bronchus or lung: Secondary | ICD-10-CM | POA: Diagnosis not present

## 2022-08-24 DIAGNOSIS — Z923 Personal history of irradiation: Secondary | ICD-10-CM | POA: Diagnosis not present

## 2022-08-24 DIAGNOSIS — Z8509 Personal history of malignant neoplasm of other digestive organs: Secondary | ICD-10-CM | POA: Insufficient documentation

## 2022-08-24 DIAGNOSIS — Z9221 Personal history of antineoplastic chemotherapy: Secondary | ICD-10-CM | POA: Diagnosis not present

## 2022-08-24 MED ORDER — BENZONATATE 200 MG PO CAPS: 200.0000 mg | ORAL_CAPSULE | Freq: Three times a day (TID) | ORAL | 0 refills | Status: DC | PRN

## 2022-08-24 NOTE — Progress Notes (Signed)
[facility] OFFICE PROGRESS NOTE   Diagnosis: Non-small cell lung cancer  INTERVAL HISTORY:   Ms. Mangen returns as scheduled.  She reports a good appetite.  She is now maintained on oxygen at 2 L/min 24 hours a day.  No fever.  Stable chronic cough.  She takes [employer] as needed.  Bowels moving regularly.  No nausea or vomiting.  Objective:  Vital signs in last 24 hours:  Blood pressure 129/75, pulse 91, temperature 98.2 F (36.8 C), temperature source Oral, resp. rate 18, height 5' 2" (1.575 m), weight 131 lb 6.4 oz (59.6 kg), SpO2 99 %.    Lymphatics: No palpable cervical, supraclavicular or axillary lymph nodes. Resp: Distant breath sounds, clear.  No respiratory distress. Cardio: Regular rate and rhythm. GI: Abdomen soft and nontender.  No hepatosplenomegaly. Vascular: No leg edema. Port-A-Cath without erythema.  Lab Results:  Lab Results  Component Value Date   WBC 4.8 07/19/2021   HGB 12.6 07/19/2021   HCT 38.2 07/19/2021   MCV 95.3 07/19/2021   PLT 138 (L) 07/19/2021   NEUTROABS 3.5 07/19/2021    Imaging:  No results found.  Medications: I have reviewed the patient's current medications.  Assessment/Plan: Adenocarcinoma/goblet cell carcinoid of the appendix CT scan of the abdomen/pelvis on 09/12/2017 - fluid-filled hypervascular appendix with mild reactive enteritis within the ileum.   Status post laparoscopic appendectomy 09/12/2017.  Postoperative course complicated by an ileus requiring TPN.   Final pathology of the appendix-5 cm adenocarcinoma/goblet cell carcinoid.  The tumor extended through the muscularis propria into subserosal soft tissue and mesoappendix and focally to the serosal surface.  Lymphovascular space invasion identified.  Perineural invasion identified.  Associated acute appendicitis with perforation. 10/15/2017 CEA 1. 11/22/2017 chromogranin A-3 Status post laparoscopic right hemicolectomy by Dr. Cecil Cobbs at [facility]  on 12/07/2017.  No carcinomatosis or liver metastases were seen.   Final pathology showed metastatic adenocarcinoma/goblet cell carcinoid in 3 of 15 pericolic lymph nodes; maximum size of largest metastasis 0.2 cm; positive for extracapsular extension.  Benign terminal ileum with fibrous serosal adhesions, negative for dysplasia or carcinoma; surgically absent appendix; appendectomy site with postsurgical changes and fibrous serosal adhesions, negative for carcinoma; proximal and distal mucosal margins negative for dysplasia or carcinoma; mesenteric resection margin negative for carcinoma. Cycle 1 adjuvant Xeloda 01/07/2018 Cycle 2 adjuvant Xeloda 01/28/2018 Non-small cell lung cancer PET scan 81/85/6314-9 hypermetabolic right upper lobe pulmonary nodules increased in size and metabolic activity.  No mediastinal nodal metastasis.  4 mm right upper lobe nodule with metabolic activity, SUV 3.6.  A third right upper lobe nodule measures 4 mm and has faint metabolic activity.  No clear evidence of malignancy in the abdomen or pelvis.  Mild activity along the ventral abdominal surgical scar.  Scattered activity in small bowel without focal lesion on CT. CT biopsy right upper lobe nodule 11/02/2017 showed non-small cell carcinoma; malignant cells positive for TTF-1 and negative for p63.  PD-L1 95%. Chest CT 01/21/2018-new mediastinal adenopathy seen in the prevascular and right paratracheal stations.  Largest lymph node is seen in the prevascular space measuring 2.0 cm, previously 6 mm.  No hilar or axillary adenopathy.  Right lung nodules are stable. Biopsy of a right paratracheal lymph node on 02/04/2018-metastatic adenocarcinoma consistent with a lung primary Chest radiation started 03/11/2018 Week 1 Taxol/carboplatin 03/13/2018 Week 2 Taxol/carboplatin 03/20/2018 Week 3 Taxol/carboplatin 03/27/2018 Week  4Taxol/carboplatin 04/03/2018 Week 5 Taxol/carboplatin 04/10/2018 Week 6 Taxol/carboplatin 04/19/2018 Chest  radiation completed 04/25/2018 CT chest 05/23/2018-interval  decrease in size of mediastinal adenopathy.  Near complete resolution of previously visualized right pleural effusion.  The right lung nodules have decreased in size.  Other nodules are stable. Cycle 1 durvalumab 05/23/2018 Cycle 2 durvalumab 06/06/2018 Cycle 3 durvalumab 06/20/2018 Cycle 4 durvalumab 07/04/2018 Cycle 5 durvalumab 07/19/2018 Cycle 6 durvalumab 08/01/2018 Cycle 7 durvalumab 08/15/2018 CT chest 09/03/2018- collapse/consolidation with bronchiectasis and surrounding groundglass involving primarily the right upper lobe, findings most indicative of recent radiation therapy. Previously measured right upper lobe nodule obscured.  New areas of nodularity in the right lower lobe, likely infectious or inflammatory. Cycle 8 durvalumab 09/04/2018 Cycle 9 durvalumab 09/18/2018 Cycle 10 durvalumab 10/02/2018 Cycle 11 durvalumab 10/16/2018  Cycle 12 durvalumab 11/13/2018 Cycle 13 durvalumab 11/30/2018 Cycle 14 durvalumab 12/11/2018 CT chest 12/16/2018 [facility])- no mediastinal, axillary or hilar adenopathy.  Centrilobular emphysema.  Right apical volume loss with bronchiectasis traversing the area of atelectatic lung.  Elevation of the adjacent minor fissure due to volume loss.  Subpleural left upper lobe opacity, nonspecific, measuring 8.7 mm.  Compression fractures T5 and T7. Cycle 15 durvalumab 12/25/2018 Cycle 16 durvalumab 01/29/2019 Cycle 17 durvalumab 02/12/2019 Cycle 18 durvalumab 02/26/2019 Cycle 19 durvalumab 03/12/2019 Cycle 20 durvalumab 03/26/2019 Cycle 21 durvalumab 04/16/2019 Cycle 22 durvalumab 04/30/2019 CT chest 05/12/2019- right lung radiation fibrosis, no evidence of local tumor recurrence, no evidence of metastatic disease in the chest, stable pulmonary nodules Cycle 23 durvalumab 05/14/2019 CT chest 10/13/2019-stable radiation changes in the right lung, new nodules in the right lower lobe, left lower lobe, and lingula-favored to  represent an inflammatory process CT chest 12/30/2019-no findings to suggest recurrent or metastatic disease, subtle groundglass at the site of previous nodularity in the right lung base almost completely resolved CT chest 06/17/2020-6 mm left lower lobe nodule-more conspicuous, stable compression deformities at T5 and T7 CT chest 10/04/2020-nodular density medial left lower lobe unchanged.  Adjacent peribronchovascular groundglass and volume loss have increased slightly and are likely infectious or inflammatory. CT chest 04/28/2021-new interstitial and nodular opacities in the right upper lobe laterally, favoring pneumonia, tumor and lymphangitic spread considered less likely.  Mild increase in loculated right pleural effusion.  Mild increase in atelectasis or nodular scarring peripherally at the left lung base.  Stable compression fractures at T5 and T7. CT chest 07/19/2021-previously described patchy nodular foci of consolidation in the upper right lung and left lung base have resolved compatible with resolved multilobar pneumonia.  Stable radiation fibrosis upper right lung with no evidence of local tumor recurrence.  No findings suspicious for metastatic disease in the chest. CT chest 02/07/2022-interval development of bilateral irregular pulmonary nodules right greater than left. CT chest 06/01/2022-unchanged posttreatment appearance of the right chest, irregular nodules and groundglass opacities in the bilateral lung bases have improved, emphysema, enlargement the distal descending and proximal abdominal aorta     COPD   4.   VATS drainage of a loculated right pleural effusion 03/01/2018   5.  Port-A-Cath placement 05/31/2018   6.  Severe mid back pain 10/30/2018-CT thoracic spine- T5 compression fracture new since 09/03/2018- appearance consistent with an osteoporotic fracture.  Pain improved.  Increased back pain January 2020.  CT scan 12/16/2018- new compression fractures T5 and T7.  T5 vertebral body  somewhat sclerotic in appearance.  MRI thoracic spine 01/08/2019-acute compression fractures involving the T5 and T7 vertebral bodies stable relative to recent CT with up to 50% height loss at T5 and 25% height loss at T7.  No associated bony retropulsion.  Benign/mechanical features  by MRI with no appreciable underlying pathologic lesion.  No other evidence for metastatic disease within the thoracic spine.     Disposition: Melissa Golden remains in clinical remission from non-small cell lung cancer.  Plan for restaging chest CT in approximately 3 months.  She has a Port-A-Cath and would like to keep this in place for now.  Next port flush in 6 weeks.  She requested a refill on Tessalon Perles.  Refill sent to her pharmacy.  She will obtain additional refills from Dr. Valeta Harms.  She will return for a port flush in 6 weeks, port flush/chest CT/office visit in 12 weeks.  We are available to see her sooner if needed.    Ned Card ANP/GNP-BC   08/24/2022  10:46 AM

## 2022-09-04 DIAGNOSIS — L720 Epidermal cyst: Secondary | ICD-10-CM | POA: Diagnosis not present

## 2022-09-07 ENCOUNTER — Telehealth: Payer: Self-pay | Admitting: *Deleted

## 2022-09-07 ENCOUNTER — Ambulatory Visit: Payer: Self-pay | Admitting: *Deleted

## 2022-09-07 NOTE — Chronic Care Management (AMB) (Signed)
  Care Coordination Note  09/07/2022 Name: Melissa Golden MRN: [id number] DOB: 01-Sep-1946  Melissa Golden is a 76 y.o. year old female who is a primary care patient of Burdine, Virgina Evener, MD and is actively engaged with the care management team. I reached out to Melissa Golden by phone today to assist with re-scheduling a follow up visit with the RN Case Manager  Follow up plan: Patient declines further follow up and engagement by the care management team. Appropriate care team members and provider have been notified via electronic communication.  Patient does not like calls about health.   [place]  Direct Dial: [phone]

## 2022-09-07 NOTE — Patient Outreach (Signed)
  Care Coordination   09/07/2022 Name: Melissa Golden MRN: [id number] DOB: 04-25-1946   Care Coordination Outreach Attempts:  An unsuccessful telephone outreach was attempted for a scheduled appointment today. There was some confusion about her appointment and unfortuntaly [name] presented to the [facility] office for her appointment. Considering that she lives in [place], she may not have been back home at the time of my call. I did leave a detailed, HIPAA compliant, voicemail with my return phone number and requested that she give me a call back. If no return call, I will reach out to patient again this afternoon. I talked with Arville Care, [facility] Assistant, who spoke with the patient and her husband in the office and she explained that today's visit was a telephone visit.  Follow Up Plan:  Additional outreach attempts will be made to offer the patient care coordination information and services.   Encounter Outcome:  No Answer  Care Coordination Interventions Activated:  No   Care Coordination Interventions:  No, not indicated    Chong Sicilian, BSN, RN-BC RN Care Coordinator [place]: [phone] Main #: [phone]

## 2022-09-29 ENCOUNTER — Encounter (HOSPITAL_COMMUNITY): Payer: Self-pay

## 2022-09-29 DIAGNOSIS — Z87891 Personal history of nicotine dependence: Secondary | ICD-10-CM | POA: Diagnosis not present

## 2022-09-29 DIAGNOSIS — Z9049 Acquired absence of other specified parts of digestive tract: Secondary | ICD-10-CM | POA: Diagnosis not present

## 2022-09-29 DIAGNOSIS — S42402A Unspecified fracture of lower end of left humerus, initial encounter for closed fracture: Secondary | ICD-10-CM | POA: Diagnosis not present

## 2022-09-29 DIAGNOSIS — Z604 Social exclusion and rejection: Secondary | ICD-10-CM | POA: Diagnosis not present

## 2022-09-29 DIAGNOSIS — Z7722 Contact with and (suspected) exposure to environmental tobacco smoke (acute) (chronic): Secondary | ICD-10-CM | POA: Diagnosis not present

## 2022-09-29 DIAGNOSIS — M25522 Pain in left elbow: Secondary | ICD-10-CM | POA: Diagnosis not present

## 2022-09-29 DIAGNOSIS — W1839XA Other fall on same level, initial encounter: Secondary | ICD-10-CM | POA: Diagnosis not present

## 2022-09-29 DIAGNOSIS — J449 Chronic obstructive pulmonary disease, unspecified: Secondary | ICD-10-CM | POA: Diagnosis not present

## 2022-09-29 DIAGNOSIS — M7981 Nontraumatic hematoma of soft tissue: Secondary | ICD-10-CM | POA: Diagnosis not present

## 2022-09-29 DIAGNOSIS — S42472A Displaced transcondylar fracture of left humerus, initial encounter for closed fracture: Secondary | ICD-10-CM | POA: Diagnosis not present

## 2022-09-29 DIAGNOSIS — M19122 Post-traumatic osteoarthritis, left elbow: Secondary | ICD-10-CM | POA: Diagnosis not present

## 2022-10-02 ENCOUNTER — Telehealth: Payer: Self-pay | Admitting: Orthopaedic Surgery

## 2022-10-02 NOTE — Telephone Encounter (Signed)
OK to double book Wednesday

## 2022-10-02 NOTE — Telephone Encounter (Signed)
Pt husband called and wants his wife to be worked in with [employer]. He is arguing with me about waiting until Thursday.   Cb [phone]

## 2022-10-04 ENCOUNTER — Ambulatory Visit: Payer: Medicare Other | Admitting: Pulmonary Disease

## 2022-10-04 ENCOUNTER — Other Ambulatory Visit (HOSPITAL_BASED_OUTPATIENT_CLINIC_OR_DEPARTMENT_OTHER): Payer: Self-pay

## 2022-10-04 ENCOUNTER — Other Ambulatory Visit (HOSPITAL_BASED_OUTPATIENT_CLINIC_OR_DEPARTMENT_OTHER): Payer: Self-pay | Admitting: Orthopaedic Surgery

## 2022-10-04 ENCOUNTER — Encounter: Payer: Self-pay | Admitting: Oncology

## 2022-10-04 ENCOUNTER — Ambulatory Visit (INDEPENDENT_AMBULATORY_CARE_PROVIDER_SITE_OTHER): Payer: Medicare Other | Admitting: Orthopaedic Surgery

## 2022-10-04 ENCOUNTER — Telehealth: Payer: Self-pay

## 2022-10-04 DIAGNOSIS — S42412B Displaced simple supracondylar fracture without intercondylar fracture of left humerus, initial encounter for open fracture: Secondary | ICD-10-CM

## 2022-10-04 MED ORDER — OXYCODONE HCL 5 MG PO TABS
5.0000 mg | ORAL_TABLET | ORAL | 0 refills | Status: DC | PRN
  Filled 2022-10-04: qty 20, 4d supply, fill #0

## 2022-10-04 NOTE — Progress Notes (Signed)
Chief Complaint: Left elbow distal humeral fracture     History of Present Illness:    Melissa Golden is a 76 y.o. female presents today status post trip and fall over her oxygen cord with a distal humeral fracture.  She was seen at [facility] and placed into a posterior slab splint.  She is here today with significant swelling in the hand.  There are some paresthesias in the small 2 fingers.  She is on baseline 2 L of oxygen in the setting of COPD.  She denies any cardiac history.  She states that she is somewhat of a home ambulator and is only able to get up and down the driveway.  She lives with her husband who is here today as well.    Surgical History:   None with regard to extremity  PMH/PSH/Family History/Social History/Meds/Allergies:    Past Medical History:  Diagnosis Date   Asthma    Cancer ([place]) 2017   colon   chemo tx   COPD (chronic obstructive pulmonary disease) ([place])    wears 3L home O2   Ileus [place] 09/12/2017   required TPN support   Lung cancer ([place]) dx'd 10/2016   Pleural effusion, right    Shortness of breath    with exertion   Past Surgical History:  Procedure Laterality Date   ABDOMINAL HYSTERECTOMY     CATARACT EXTRACTION W/ INTRAOCULAR LENS IMPLANT     Right   CATARACT EXTRACTION W/PHACO  01/02/2013   Procedure: CATARACT EXTRACTION PHACO AND INTRAOCULAR LENS PLACEMENT ([place]);  Surgeon: Tonny Branch, MD;  Location: [facility];  Service: Ophthalmology;  Laterality: Left;  CDE: 16.19   EYE SURGERY     IR IMAGING GUIDED PORT INSERTION  05/31/2018   IR THORACENTESIS ASP PLEURAL SPACE W/IMG GUIDE  02/19/2018   LAPAROSCOPIC APPENDECTOMY  09/12/2017   laparoscopic hemicolectomy Right 12/07/2017   DR. [facility] and [facility]   LUNG BIOPSY     LYMPH NODE BIOPSY N/A 02/04/2018   Procedure: LYMPH NODE BIOPSY;  Surgeon: Gaye Pollack, MD;  Location: [facility] OR;  Service: Thoracic;  Laterality:  N/A;   MEDIASTINOSCOPY N/A 02/04/2018   Procedure: MEDIASTINOSCOPY;  Surgeon: Gaye Pollack, MD;  Location: [facility] OR;  Service: Thoracic;  Laterality: N/A;   PLEURAL EFFUSION DRAINAGE Right 03/01/2018   Procedure: DRAINAGE OF PLEURAL EFFUSION;  Surgeon: Gaye Pollack, MD;  Location: [place];  Service: Thoracic;  Laterality: Right;   VIDEO ASSISTED THORACOSCOPY Right 03/01/2018   Procedure: VIDEO ASSISTED THORACOSCOPY;  Surgeon: Gaye Pollack, MD;  Location: [facility] OR;  Service: Thoracic;  Laterality: Right;   Social History   Socioeconomic History   Marital status: Married    Spouse name: Not on file   Number of children: Not on file   Years of education: Not on file   Highest education level: Not on file  Occupational History   Occupation: retired  Tobacco Use   Smoking status: Former    Packs/day: 1.50    Years: 68.00    Total pack years: 102.00    Types: Cigarettes    Quit date: 10/2016    Years since quitting: 5.9   Smokeless tobacco: Never  Vaping Use   Vaping Use: Never used  Substance and Sexual Activity   Alcohol  use: Not Currently    Alcohol/week: 6.0 standard drinks of alcohol    Types: 6 Cans of beer per week   Drug use: No   Sexual activity: Not on file  Other Topics Concern   Not on file  Social History Narrative   Not on file   Social Determinants of Health   Financial Resource Strain: Not on file  Food Insecurity: No Food Insecurity (07/12/2022)   Hunger Vital Sign    Worried About Running Out of Food in the Last Year: Never true    Ran Out of Food in the Last Year: Never true  Transportation Needs: No Transportation Needs (07/12/2022)   PRAPARE - Hydrologist (Medical): No    Lack of Transportation (Non-Medical): No  Physical Activity: Not on file  Stress: Not on file  Social Connections: Not on file   Family History  Problem Relation Age of Onset   Cancer Mother    Heart attack Father    Cancer - Colon Sister    No Known  Allergies Current Outpatient Medications  Medication Sig Dispense Refill   acetaminophen (TYLENOL) 500 MG tablet Take 2 tablets (1,000 mg total) by mouth every 6 (six) hours. 30 tablet 0   albuterol (PROVENTIL) (2.5 MG/3ML) 0.083% nebulizer solution Take 3 mLs (2.5 mg total) by nebulization every 4 (four) hours as needed for wheezing or shortness of breath. (Patient not taking: Reported on 06/01/2022) 75 mL 11   Ascorbic Acid (VITAMIN C) 100 MG tablet Take 100 mg by mouth 2 (two) times daily with a meal.     benzonatate (TESSALON) 200 MG capsule Take 1 capsule (200 mg total) by mouth 3 (three) times daily as needed for cough. 90 capsule 0   doxycycline (VIBRAMYCIN) 100 MG capsule Take 100 mg by mouth 2 (two) times daily.     Fluticasone-Umeclidin-Vilant (TRELEGY ELLIPTA) 100-62.5-25 MCG/ACT AEPB Inhale 1 puff into the lungs daily. 60 each 5   ibuprofen (ADVIL,MOTRIN) 200 MG tablet Take 400 mg by mouth every 6 (six) hours as needed for moderate pain. Pain     lidocaine-prilocaine (EMLA) cream Apply to portacath 30 min to 1 hour prior to flush or infusions 30 g 1   montelukast (SINGULAIR) 10 MG tablet Take 1 tablet (10 mg total) by mouth at bedtime. 90 tablet 5   Multiple Vitamin (MULTIVITAMIN WITH MINERALS) TABS Take 1 tablet by mouth daily.     Respiratory Therapy Supplies (FLUTTER) DEVI 1 Device by Does not apply route as needed. 1 each 0   VENTOLIN HFA 108 (90 Base) MCG/ACT inhaler Inhale 1-2 puffs into the lungs every 4 (four) hours as needed for wheezing or shortness of breath. 18 g 11   No current facility-administered medications for this visit.   No results found.  Review of Systems:   A ROS was performed including pertinent positives and negatives as documented in the HPI.  Physical Exam :   Constitutional: NAD and appears stated age Neurological: Alert and oriented Psych: Appropriate affect and cooperative There were no vitals taken for this visit.   Comprehensive Musculoskeletal  Exam:    Swelling and bruising about the left elbow with significant fracture blisters about the medial elbow and swelling down the forearm and hand.  There is a area where the proximal Ace wrap was quite compressive.  2+ radial pulse.  Otherwise distal neurosensory exam is intact  Imaging:   Xray (3 views left elbow, CT left elbow): Comminuted intra-articular  distal humeral fracture    I personally reviewed and interpreted the radiographs.   Assessment:   76 y.o. female with a comminuted intra-articular distal humeral fracture.  Overall I do believe that she has significant swelling related to the tightness of the splint.  This was taken off her today.  A Xeroform dressing was placed over fracture blisters and a looser well-padded splint was applied.  At this time given the complexity of her fracture pattern I do believe she would benefit from a consultation with Dr. Doreatha Martin.  I will plan to make a referral for her to be seen as soon as possible.  Plan :    -Plan for referral to Dr. Doreatha Martin for discussion of her left distal humeral fracture     I personally saw and evaluated the patient, and participated in the management and treatment plan.  Vanetta Mulders, MD Attending Physician, Orthopedic Surgery  This document was dictated using Dragon voice recognition software. A reasonable attempt at proof reading has been made to minimize errors.

## 2022-10-04 NOTE — Addendum Note (Signed)
Addended by: Yevonne Pax on: 10/04/2022 03:09 PM   Modules accepted: Orders

## 2022-10-05 ENCOUNTER — Inpatient Hospital Stay: Payer: Medicare Other | Attending: Oncology

## 2022-10-05 VITALS — BP 106/66 | HR 99 | Temp 98.9°F | Resp 18

## 2022-10-05 DIAGNOSIS — Z95828 Presence of other vascular implants and grafts: Secondary | ICD-10-CM

## 2022-10-05 DIAGNOSIS — Z8509 Personal history of malignant neoplasm of other digestive organs: Secondary | ICD-10-CM | POA: Insufficient documentation

## 2022-10-05 DIAGNOSIS — Z452 Encounter for adjustment and management of vascular access device: Secondary | ICD-10-CM | POA: Diagnosis not present

## 2022-10-05 DIAGNOSIS — Z85118 Personal history of other malignant neoplasm of bronchus and lung: Secondary | ICD-10-CM | POA: Insufficient documentation

## 2022-10-05 DIAGNOSIS — C3411 Malignant neoplasm of upper lobe, right bronchus or lung: Secondary | ICD-10-CM

## 2022-10-05 MED ORDER — HEPARIN SOD (PORK) LOCK FLUSH 100 UNIT/ML IV SOLN
500.0000 [IU] | Freq: Once | INTRAVENOUS | Status: AC
  Administered 2022-10-05: 500 [IU]

## 2022-10-05 MED ORDER — SODIUM CHLORIDE 0.9% FLUSH
10.0000 mL | Freq: Once | INTRAVENOUS | Status: AC
  Administered 2022-10-05: 10 mL

## 2022-10-05 NOTE — Patient Instructions (Signed)

## 2022-10-06 ENCOUNTER — Ambulatory Visit: Payer: Medicare Other | Admitting: Pulmonary Disease

## 2022-10-09 ENCOUNTER — Ambulatory Visit: Payer: Self-pay | Admitting: Student

## 2022-10-09 DIAGNOSIS — S42402A Unspecified fracture of lower end of left humerus, initial encounter for closed fracture: Secondary | ICD-10-CM | POA: Diagnosis not present

## 2022-10-10 ENCOUNTER — Other Ambulatory Visit: Payer: Self-pay

## 2022-10-10 ENCOUNTER — Encounter (HOSPITAL_COMMUNITY): Payer: Self-pay | Admitting: Student

## 2022-10-10 NOTE — Progress Notes (Signed)
S.D.W- Instructions   Your procedure is scheduled on Wed., Nov. 15, 2023 from 10:00AM-12:17PM.  Report to [facility] Main Entrance "A" at 7:30 A.M., then check in with the Admitting office.  Call this number if you have problems the morning of surgery:  [phone]             If you experience any cold or flu symptoms such as cough, fever, chills, shortness of breath, etc. between now and your scheduled surgery, please notify us at the above number.   Remember:  Do not eat or drink after midnight on Nov. 14th    Take these medicines the morning of surgery with A SIP OF WATER:    As of today, STOP taking any Aspirin (unless otherwise instructed by your surgeon) Aleve, Naproxen, Ibuprofen, Motrin, Advil, Goody's, BC's, all herbal medications, fish oil, and all vitamins.          Do not wear jewelry or makeup. Do not wear lotions, powders, perfumes or deodorant. Do not shave 48 hours prior to surgery.  Do not bring valuables to the hospital. Do not wear nail polish, gel polish, artificial nails, or any other type of covering on natural nails (fingers and toes) If you have artificial nails or gel coating that need to be removed by a nail salon, please have this removed prior to surgery. Artificial nails or gel coating may interfere with anesthesia's ability to adequately monitor your vital signs.  [facility] is not responsible for any belongings or valuables.    Do NOT Smoke (Tobacco/Vaping)  24 hours prior to your procedure  If you use a CPAP at night, you may bring your mask for your overnight stay.   Contacts, glasses, hearing aids, dentures or partials may not be worn into surgery, please bring cases for these belongings   For patients admitted to the hospital, discharge time will be determined by your treatment team.   Patients discharged the day of surgery will not be allowed to drive home, and someone needs to stay with them for 24 hours.  Special instructions:    Oral  Hygiene is also important to reduce your risk of infection.  Remember - BRUSH YOUR TEETH THE MORNING OF SURGERY WITH YOUR REGULAR TOOTHPASTE  [facility]- Preparing For Surgery  Before surgery, you can play an important role. Because skin is not sterile, your skin needs to be as free of germs as possible. You can reduce the number of germs on your skin by washing with Antibacterial Soap before surgery.     Please follow these instructions carefully.     Shower the NIGHT BEFORE SURGERY and the MORNING OF SURGERY with Antibacterial Soap.   Pat yourself dry with a CLEAN TOWEL.  Wear CLEAN PAJAMAS to bed the night before surgery  Place CLEAN SHEETS on your bed the night before your surgery  DO NOT SLEEP WITH PETS.  Day of Surgery:  Take a shower with Antibacterial soap. Wear Clean/Comfortable clothing the morning of surgery Do not apply any deodorants/lotions.   Remember to brush your teeth WITH YOUR REGULAR TOOTHPASTE.   If you test positive for Covid, or been in contact with anyone that has tested positive in the last 10 days, please notify your surgeon.  SURGICAL WAITING ROOM VISITATION Patients having surgery or a procedure may have no more than 2 support people in the waiting area - these visitors may rotate.   Children under the age of 64 must have an adult with them  who is not the patient. If the patient needs to stay at the hospital during part of their recovery, the visitor guidelines for inpatient rooms apply. Pre-op nurse will coordinate an appropriate time for 1 support person to accompany patient in pre-op.  This support person may not rotate.   Please refer to the [facility] website for the visitor guidelines for Inpatients (after your surgery is over and you are in a regular room).

## 2022-10-10 NOTE — Progress Notes (Signed)
PCP - Dr. Rogers Seeds, K.- [place]  Cardiologist - Denies  EP- Denies  Endocrine- Denies  Pulm- Dr. Valeta Harms  Chest x-ray - 05/03/22 (CE)  EKG - Denies  Stress Test - Denies  ECHO - Denies  Cardiac Cath - Denies  AICD-na PM-na LOOP-na  Nerve Stimulator- Denies  Dialysis- Denies  Sleep Study - Denies CPAP - Denies  LABS- 10/11/22: CBC  ASA- Denies  ERAS- No  HA1C- Denies  Anesthesia- No  Pt denies having chest pain, sob, or fever during the pre-op phone call. All instructions explained to the pt, with a verbal understanding of the material. Pt also instructed to wear a mask and social distance if she goes out. The opportunity to ask questions was provided.

## 2022-10-10 NOTE — H&P (Signed)
Orthopaedic Trauma Service (OTS) H&P  Patient ID: Melissa Golden MRN: [id number] DOB/AGE: July 10, 1946 76 y.o.  Reason for Surgery: Left distal humerus fracture  HPI: Melissa Golden is an 76 y.o. female with PMH significant for COPD on 2L supplemental O2 at baseline presenting for surgery on left upper extremity.  Patient tripped and fell over her oxygen cord while at home on 09/29/2022.  Initially seen in [facility] emergency department and found to have left distal humerus fracture.  She was placed in a long arm splint and referred to [facility] for outpatient follow-up.  Was seen by Dr. Sammuel Hines on 10/04/2022.  Due to complex nature of injury, Dr. Sammuel Hines felt this was outside the scope of practice and asked orthopedic trauma service to evaluate patient for definitive management. Patient seen by Dr. Doreatha Martin on 10/09/2022.  She continues to have pain in the left upper extremity which is currently being managed with oxycodone.  Other than some tingling in the fourth digit of her left hand, denies any numbness or tingling throughout the left upper extremity.  Denies any other injuries from her fall.  Denies any previous injury or surgery to the left upper extremity.   Denies any anticoagulation use.  No history of diabetes or hypertension.  Normally ambulates at baseline with no assistive device but occasionally uses a walker if needed.  Past Medical History:  Diagnosis Date   Asthma    Cancer ([place]) 2017   colon   chemo tx   COPD (chronic obstructive pulmonary disease) ([place])    wears 3L home O2   Ileus following gastrointestinal surgery ([place]) 09/12/2017   required TPN support   Lung cancer ([place]) dx'd 10/2016   Pleural effusion, right    Shortness of breath    with exertion    Past Surgical History:  Procedure Laterality Date   ABDOMINAL HYSTERECTOMY     CATARACT EXTRACTION W/ INTRAOCULAR LENS IMPLANT     Right   CATARACT EXTRACTION W/PHACO  01/02/2013   Procedure: CATARACT  EXTRACTION PHACO AND INTRAOCULAR LENS PLACEMENT ([place]);  Surgeon: Tonny Branch, MD;  Location: [facility] OR[facility];  Service: Ophthalmology;  Laterality: Left;  CDE: 16.19   EYE SURGERY     IR IMAGING GUIDED PORT INSERTION  05/31/2018   IR THORACENTESIS ASP PLEURAL SPACE W/IMG GUIDE  02/19/2018   LAPAROSCOPIC APPENDECTOMY  09/12/2017   laparoscopic hemicolectomy Right 12/07/2017   DR. St[facility]zenberg and [facility]   LUNG BIOPSY     LYMPH NODE BIOPSY N/A 02/04/2018   Procedure: LYMPH NODE BIOPSY;  Surgeon: Gaye Pollack, MD;  Location: [facility];  Service: Thoracic;  Laterality: N/A;   MEDIASTINOSCOPY N/A 02/04/2018   Procedure: MEDIASTINOSCOPY;  Surgeon: Gaye Pollack, MD;  Location: [place];  Service: Thoracic;  Laterality: N/A;   PLEURAL EFFUSION DRAINAGE Right 03/01/2018   Procedure: DRAINAGE OF PLEURAL EFFUSION;  Surgeon: Gaye Pollack, MD;  Location: [place];  Service: Thoracic;  Laterality: Right;   VIDEO ASSISTED THORACOSCOPY Right 03/01/2018   Procedure: VIDEO ASSISTED THORACOSCOPY;  Surgeon: Gaye Pollack, MD;  Location: [facility] OR;  Service: Thoracic;  Laterality: Right;    Family History  Problem Relation Age of Onset   Cancer Mother    Heart attack Father    Cancer - Colon Sister     Social History:  reports that she quit smoking about 5 years ago. Her smoking use included cigarettes. She has a 102.00 pack-year smoking history. She has never used smokeless tobacco.  She reports that she does not currently use alcohol after a past usage of about 6.0 standard drinks of alcohol per week. She reports that she does not use drugs.  Allergies: No Known Allergies  Medications: I have reviewed the patient's current medications. Prior to Admission:  No medications prior to admission.    ROS: Constitutional: No fever or chills Vision: No changes in vision ENT: No difficulty swallowing CV: No chest pain Pulm: No SOB or wheezing GI: No nausea or vomiting GU: No urgency or inability to hold urine Skin: No  poor wound healing Neurologic: No numbness or tingling Psychiatric: No depression or anxiety Heme: No bruising Allergic: No reaction to medications or food   Exam: There were no vitals taken for this visit. General:NAD Orientation:Alert and oriented x 4 Mood and Affect: Mood and affect appropriate Gait: Within normal limits Coordination and balance: Within normal limits  LUE: Long arm splint in place. Non-tender above splint. Did not remove splint for exam due to know fracture but was able to make a window in the cast padding to evaluate the skin. Notable bruising throughout the distal humerus, elbow, forearm. Swelling about the elbow present but appropriate. Notable swelling and ecchymosis to the hand and fingers. She is able to wiggle all fingers. Does have some tingling in her 4th digit but otherwise endorse sensation over her hand and fingers. Remainder of distal motor and sensory exam limited due to splint being in place.  RUE: Skin without lesions. No tenderness to palpation. Full painless ROM, full strength in each muscle group without evidence of instability. Motor and sensory function at baseline. Neurovascularly intact   Medical Decision Making: Data: Imaging: [facility] and lateral views of the left elbow shows displaced fracture of the distal humerus. No intra-articular involvement.   Labs: No results found for this or any previous visit (from the past 168 hour(s)).  Assessment/Plan: 76 year old female with left extra-articular distal humerus fracture  Patient with significant injury involving the left upper extremity which will require surgical intervention. We did discuss ORIF versus total elbow arthoplasty including the risks and benefits of each of these procedures. Due to the distal nature of the fracture, open reduction internal fixation may have an increased risk of hardware failure with screws wiggling free or backing out. With an arthoplasty, patient would have a permanent 5  lb lifting limit with her left arm. Due to her current level of activity, patient has elected to proceed with open reduction internal fixation of the left distal humerus. Risks discussed included bleeding, infection, malunion, nonunion, damage to surrounding nerves and blood vessels, pain, hardware prominence or irritation, hardware failure, stiffness, post-traumatic arthritis, DVT/PE, and anesthesia complications. We will plan to admit the patient overnight for observation, pain control and therapies.

## 2022-10-11 ENCOUNTER — Ambulatory Visit (HOSPITAL_BASED_OUTPATIENT_CLINIC_OR_DEPARTMENT_OTHER): Payer: Medicare Other | Admitting: Certified Registered Nurse Anesthetist

## 2022-10-11 ENCOUNTER — Encounter (HOSPITAL_COMMUNITY)
Admission: RE | Disposition: A | Discharge status: Home / Self Care | Payer: Self-pay | Source: Home / Self Care | Attending: Student

## 2022-10-11 ENCOUNTER — Encounter (HOSPITAL_COMMUNITY): Payer: Self-pay | Admitting: Student

## 2022-10-11 ENCOUNTER — Observation Stay (HOSPITAL_COMMUNITY)
Admission: RE | Admit: 2022-10-11 | Discharge: 2022-10-12 | Disposition: A | Discharge status: Home / Self Care | Payer: Medicare Other | Attending: Student | Admitting: Student

## 2022-10-11 ENCOUNTER — Other Ambulatory Visit: Payer: Self-pay

## 2022-10-11 ENCOUNTER — Ambulatory Visit (HOSPITAL_COMMUNITY): Payer: Medicare Other | Admitting: Certified Registered Nurse Anesthetist

## 2022-10-11 ENCOUNTER — Ambulatory Visit (HOSPITAL_COMMUNITY): Payer: Medicare Other

## 2022-10-11 ENCOUNTER — Observation Stay (HOSPITAL_COMMUNITY): Payer: Medicare Other

## 2022-10-11 DIAGNOSIS — J45909 Unspecified asthma, uncomplicated: Secondary | ICD-10-CM | POA: Diagnosis not present

## 2022-10-11 DIAGNOSIS — S42412A Displaced simple supracondylar fracture without intercondylar fracture of left humerus, initial encounter for closed fracture: Secondary | ICD-10-CM | POA: Diagnosis not present

## 2022-10-11 DIAGNOSIS — D3A020 Benign carcinoid tumor of the appendix: Secondary | ICD-10-CM | POA: Diagnosis not present

## 2022-10-11 DIAGNOSIS — Z87891 Personal history of nicotine dependence: Secondary | ICD-10-CM | POA: Diagnosis not present

## 2022-10-11 DIAGNOSIS — Z85038 Personal history of other malignant neoplasm of large intestine: Secondary | ICD-10-CM | POA: Insufficient documentation

## 2022-10-11 DIAGNOSIS — Y92009 Unspecified place in unspecified non-institutional (private) residence as the place of occurrence of the external cause: Secondary | ICD-10-CM | POA: Diagnosis not present

## 2022-10-11 DIAGNOSIS — Z85118 Personal history of other malignant neoplasm of bronchus and lung: Secondary | ICD-10-CM | POA: Diagnosis not present

## 2022-10-11 DIAGNOSIS — J449 Chronic obstructive pulmonary disease, unspecified: Secondary | ICD-10-CM | POA: Insufficient documentation

## 2022-10-11 DIAGNOSIS — S42472A Displaced transcondylar fracture of left humerus, initial encounter for closed fracture: Secondary | ICD-10-CM | POA: Diagnosis not present

## 2022-10-11 DIAGNOSIS — S42402A Unspecified fracture of lower end of left humerus, initial encounter for closed fracture: Secondary | ICD-10-CM | POA: Diagnosis not present

## 2022-10-11 DIAGNOSIS — W010XXA Fall on same level from slipping, tripping and stumbling without subsequent striking against object, initial encounter: Secondary | ICD-10-CM | POA: Insufficient documentation

## 2022-10-11 DIAGNOSIS — G8918 Other acute postprocedural pain: Secondary | ICD-10-CM | POA: Diagnosis not present

## 2022-10-11 DIAGNOSIS — Z9889 Other specified postprocedural states: Secondary | ICD-10-CM | POA: Diagnosis not present

## 2022-10-11 HISTORY — PX: ORIF HUMERUS FRACTURE: SHX2126

## 2022-10-11 LAB — BASIC METABOLIC PANEL
Anion gap: 11 (ref 5–15)
BUN: 7 mg/dL — ABNORMAL LOW (ref 8–23)
CO2: 33 mmol/L — ABNORMAL HIGH (ref 22–32)
Calcium: 8.7 mg/dL — ABNORMAL LOW (ref 8.9–10.3)
Chloride: 97 mmol/L — ABNORMAL LOW (ref 98–111)
Creatinine, Ser: 0.49 mg/dL (ref 0.44–1.00)
GFR, Estimated: >60 mL/min (ref >60)
Glucose, Bld: 92 mg/dL (ref 70–99)
Potassium: 3.8 mmol/L (ref 3.5–5.1)
Sodium: 141 mmol/L (ref 135–145)

## 2022-10-11 LAB — CBC
HCT: 30.1 % — ABNORMAL LOW (ref 36.0–46.0)
Hemoglobin: 9.9 g/dL — ABNORMAL LOW (ref 12.0–15.0)
MCH: 31.3 pg (ref 26.0–34.0)
MCHC: 32.9 g/dL (ref 30.0–36.0)
MCV: 95.3 fL (ref 80.0–100.0)
Platelets: 199 10*3/uL (ref 150–400)
RBC: 3.16 MIL/uL — ABNORMAL LOW (ref 3.87–5.11)
RDW: 11.7 % (ref 11.5–15.5)
WBC: 6.2 10*3/uL (ref 4.0–10.5)
nRBC: 0 % (ref 0.0–0.2)

## 2022-10-11 SURGERY — OPEN REDUCTION INTERNAL FIXATION (ORIF) DISTAL HUMERUS FRACTURE
Anesthesia: General | Laterality: Left

## 2022-10-11 MED ORDER — METOCLOPRAMIDE HCL 5 MG/ML IJ SOLN: 5.0000 mg | Freq: Three times a day (TID) | INTRAMUSCULAR | Status: DC | PRN

## 2022-10-11 MED ORDER — PROPOFOL 500 MG/50ML IV EMUL
INTRAVENOUS | Status: DC | PRN
  Administered 2022-10-11: 60 ug/kg/min via INTRAVENOUS

## 2022-10-11 MED ORDER — AMISULPRIDE (ANTIEMETIC) 5 MG/2ML IV SOLN: 10.0000 mg | Freq: Once | INTRAVENOUS | Status: DC | PRN

## 2022-10-11 MED ORDER — CELECOXIB 200 MG PO CAPS
200.0000 mg | ORAL_CAPSULE | Freq: Two times a day (BID) | ORAL | Status: DC
  Administered 2022-10-11 – 2022-10-12 (×2): 200 mg via ORAL
  Filled 2022-10-11 (×2): qty 1

## 2022-10-11 MED ORDER — ALBUTEROL SULFATE (2.5 MG/3ML) 0.083% IN NEBU
INHALATION_SOLUTION | RESPIRATORY_TRACT | Status: AC
  Filled 2022-10-11: qty 3

## 2022-10-11 MED ORDER — ACETAMINOPHEN 500 MG PO TABS
1000.0000 mg | ORAL_TABLET | Freq: Four times a day (QID) | ORAL | Status: DC
  Administered 2022-10-11 – 2022-10-12 (×4): 1000 mg via ORAL
  Filled 2022-10-11 (×4): qty 2

## 2022-10-11 MED ORDER — PROPOFOL 10 MG/ML IV BOLUS
INTRAVENOUS | Status: DC | PRN
  Administered 2022-10-11: 200 mg via INTRAVENOUS
  Administered 2022-10-11: 30 mg via INTRAVENOUS

## 2022-10-11 MED ORDER — ONDANSETRON HCL 4 MG PO TABS: 4.0000 mg | ORAL_TABLET | Freq: Four times a day (QID) | ORAL | Status: DC | PRN

## 2022-10-11 MED ORDER — FENTANYL CITRATE (PF) 250 MCG/5ML IJ SOLN
INTRAMUSCULAR | Status: AC
  Filled 2022-10-11: qty 5

## 2022-10-11 MED ORDER — MIDAZOLAM HCL 2 MG/2ML IJ SOLN
INTRAMUSCULAR | Status: AC
  Administered 2022-10-11: 1 mg via INTRAVENOUS
  Filled 2022-10-11: qty 2

## 2022-10-11 MED ORDER — LIDOCAINE 2% (20 MG/ML) 5 ML SYRINGE
INTRAMUSCULAR | Status: DC | PRN
  Administered 2022-10-11: 40 mg via INTRAVENOUS

## 2022-10-11 MED ORDER — ALBUTEROL SULFATE HFA 108 (90 BASE) MCG/ACT IN AERS: 1.0000 | INHALATION_SPRAY | RESPIRATORY_TRACT | Status: DC | PRN

## 2022-10-11 MED ORDER — SODIUM CHLORIDE 0.9 % IV SOLN: INTRAVENOUS | Status: DC

## 2022-10-11 MED ORDER — OXYCODONE HCL 5 MG PO TABS: 5.0000 mg | ORAL_TABLET | Freq: Once | ORAL | Status: DC | PRN

## 2022-10-11 MED ORDER — VANCOMYCIN HCL 1000 MG IV SOLR
INTRAVENOUS | Status: DC | PRN
  Administered 2022-10-11: 1 g via TOPICAL

## 2022-10-11 MED ORDER — OXYCODONE HCL 5 MG/5ML PO SOLN: 5.0000 mg | Freq: Once | ORAL | Status: DC | PRN

## 2022-10-11 MED ORDER — FENTANYL CITRATE (PF) 100 MCG/2ML IJ SOLN
INTRAMUSCULAR | Status: AC
  Administered 2022-10-11: 50 ug
  Filled 2022-10-11: qty 2

## 2022-10-11 MED ORDER — DOCUSATE SODIUM 100 MG PO CAPS
100.0000 mg | ORAL_CAPSULE | Freq: Two times a day (BID) | ORAL | Status: DC
  Administered 2022-10-11 – 2022-10-12 (×2): 100 mg via ORAL
  Filled 2022-10-11 (×2): qty 1

## 2022-10-11 MED ORDER — METHOCARBAMOL 1000 MG/10ML IJ SOLN: 500.0000 mg | Freq: Four times a day (QID) | INTRAVENOUS | Status: DC | PRN

## 2022-10-11 MED ORDER — PROMETHAZINE HCL 25 MG/ML IJ SOLN: 6.2500 mg | INTRAMUSCULAR | Status: DC | PRN

## 2022-10-11 MED ORDER — MIDAZOLAM HCL 2 MG/2ML IJ SOLN
INTRAMUSCULAR | Status: AC
  Filled 2022-10-11: qty 2

## 2022-10-11 MED ORDER — OXYCODONE HCL 5 MG PO TABS
5.0000 mg | ORAL_TABLET | ORAL | Status: DC | PRN
  Administered 2022-10-11: 5 mg via ORAL
  Filled 2022-10-11: qty 1

## 2022-10-11 MED ORDER — PANTOPRAZOLE SODIUM 40 MG PO TBEC
40.0000 mg | DELAYED_RELEASE_TABLET | Freq: Every day | ORAL | Status: DC
  Administered 2022-10-12: 40 mg via ORAL
  Filled 2022-10-11: qty 1

## 2022-10-11 MED ORDER — PHENYLEPHRINE HCL-NACL 20-0.9 MG/250ML-% IV SOLN
INTRAVENOUS | Status: DC | PRN
  Administered 2022-10-11: 40 ug/min via INTRAVENOUS

## 2022-10-11 MED ORDER — LACTATED RINGERS IV SOLN: INTRAVENOUS | Status: DC

## 2022-10-11 MED ORDER — MIDAZOLAM HCL 2 MG/2ML IJ SOLN: 1.0000 mg | Freq: Once | INTRAMUSCULAR | Status: AC

## 2022-10-11 MED ORDER — ORAL CARE MOUTH RINSE: 15.0000 mL | Freq: Once | OROMUCOSAL | Status: AC

## 2022-10-11 MED ORDER — HYDRALAZINE HCL 10 MG PO TABS: 10.0000 mg | ORAL_TABLET | Freq: Four times a day (QID) | ORAL | Status: DC | PRN

## 2022-10-11 MED ORDER — HYDROMORPHONE HCL 1 MG/ML IJ SOLN: 0.2500 mg | INTRAMUSCULAR | Status: DC | PRN

## 2022-10-11 MED ORDER — METOCLOPRAMIDE HCL 5 MG PO TABS: 5.0000 mg | ORAL_TABLET | Freq: Three times a day (TID) | ORAL | Status: DC | PRN

## 2022-10-11 MED ORDER — 0.9 % SODIUM CHLORIDE (POUR BTL) OPTIME
TOPICAL | Status: DC | PRN
  Administered 2022-10-11: 1000 mL

## 2022-10-11 MED ORDER — ONDANSETRON HCL 4 MG/2ML IJ SOLN
INTRAMUSCULAR | Status: DC | PRN
  Administered 2022-10-11: 4 mg via INTRAVENOUS

## 2022-10-11 MED ORDER — CEFAZOLIN SODIUM-DEXTROSE 2-4 GM/100ML-% IV SOLN
2.0000 g | INTRAVENOUS | Status: AC
  Administered 2022-10-11: 2 g via INTRAVENOUS
  Filled 2022-10-11: qty 100

## 2022-10-11 MED ORDER — ROPIVACAINE HCL 5 MG/ML IJ SOLN
INTRAMUSCULAR | Status: DC | PRN
  Administered 2022-10-11: 30 mL via PERINEURAL

## 2022-10-11 MED ORDER — UMECLIDINIUM BROMIDE 62.5 MCG/ACT IN AEPB: 1.0000 | INHALATION_SPRAY | Freq: Every day | RESPIRATORY_TRACT | Status: DC

## 2022-10-11 MED ORDER — ALBUTEROL SULFATE (2.5 MG/3ML) 0.083% IN NEBU
2.5000 mg | INHALATION_SOLUTION | RESPIRATORY_TRACT | Status: DC | PRN
  Administered 2022-10-11: 2.5 mg via RESPIRATORY_TRACT

## 2022-10-11 MED ORDER — CEFAZOLIN SODIUM-DEXTROSE 2-4 GM/100ML-% IV SOLN
2.0000 g | Freq: Three times a day (TID) | INTRAVENOUS | Status: AC
  Administered 2022-10-11 – 2022-10-12 (×3): 2 g via INTRAVENOUS
  Filled 2022-10-11 (×3): qty 100

## 2022-10-11 MED ORDER — METHOCARBAMOL 500 MG PO TABS
500.0000 mg | ORAL_TABLET | Freq: Four times a day (QID) | ORAL | Status: DC | PRN
  Administered 2022-10-11: 500 mg via ORAL
  Filled 2022-10-11: qty 1

## 2022-10-11 MED ORDER — CHLORHEXIDINE GLUCONATE 0.12 % MT SOLN
15.0000 mL | Freq: Once | OROMUCOSAL | Status: AC
  Administered 2022-10-11: 15 mL via OROMUCOSAL
  Filled 2022-10-11: qty 15

## 2022-10-11 MED ORDER — ONDANSETRON HCL 4 MG/2ML IJ SOLN: 4.0000 mg | Freq: Four times a day (QID) | INTRAMUSCULAR | Status: DC | PRN

## 2022-10-11 MED ORDER — HYDROMORPHONE HCL 1 MG/ML IJ SOLN: 0.5000 mg | INTRAMUSCULAR | Status: DC | PRN

## 2022-10-11 MED ORDER — POLYETHYLENE GLYCOL 3350 17 G PO PACK: 17.0000 g | PACK | Freq: Every day | ORAL | Status: DC | PRN

## 2022-10-11 MED ORDER — VANCOMYCIN HCL 1000 MG IV SOLR
INTRAVENOUS | Status: AC
  Filled 2022-10-11: qty 20

## 2022-10-11 MED ORDER — MONTELUKAST SODIUM 10 MG PO TABS
10.0000 mg | ORAL_TABLET | Freq: Every day | ORAL | Status: DC
  Administered 2022-10-11: 10 mg via ORAL
  Filled 2022-10-11: qty 1

## 2022-10-11 MED ORDER — FENTANYL CITRATE PF 50 MCG/ML IJ SOSY: 50.0000 ug | PREFILLED_SYRINGE | Freq: Once | INTRAMUSCULAR | Status: AC

## 2022-10-11 MED ORDER — FLUTICASONE FUROATE-VILANTEROL 100-25 MCG/ACT IN AEPB: 1.0000 | INHALATION_SPRAY | Freq: Every day | RESPIRATORY_TRACT | Status: DC

## 2022-10-11 MED ORDER — VITAMIN C 100 MG PO TABS: 100.0000 mg | ORAL_TABLET | Freq: Two times a day (BID) | ORAL | Status: DC

## 2022-10-11 SURGICAL SUPPLY — 73 items
BAG COUNTER SPONGE SURGICOUNT (BAG) ×2 IMPLANT
BAG SPNG CNTER NS LX DISP (BAG) ×1
BIT DRILL QC 2.0 SHORT EVOS SM (DRILL) IMPLANT
BIT DRILL QC 2.5MM SHRT EVO SM (DRILL) IMPLANT
BNDG ELASTIC 3X5.8 VLCR STR LF (GAUZE/BANDAGES/DRESSINGS) IMPLANT
BNDG ELASTIC 4X5.8 VLCR STR LF (GAUZE/BANDAGES/DRESSINGS) IMPLANT
BRUSH SCRUB EZ PLAIN DRY (MISCELLANEOUS) ×4 IMPLANT
CLEANER TIP ELECTROSURG 2X2 (MISCELLANEOUS) ×2 IMPLANT
COVER SURGICAL LIGHT HANDLE (MISCELLANEOUS) ×2 IMPLANT
DRAPE C-ARM 42X72 X-RAY (DRAPES) ×2 IMPLANT
DRAPE C-ARMOR (DRAPES) IMPLANT
DRAPE HALF SHEET 40X57 (DRAPES) ×2 IMPLANT
DRAPE ORTHO SPLIT 77X108 STRL (DRAPES) ×2
DRAPE SURG ORHT 6 SPLT 77X108 (DRAPES) ×2 IMPLANT
DRAPE U-SHAPE 47X51 STRL (DRAPES) ×2 IMPLANT
DRILL QC 2.0 SHORT EVOS SM (DRILL) ×1
DRILL QC 2.5MM SHORT EVOS SM (DRILL) ×1
DRSG MEPITEL 4X7.2 (GAUZE/BANDAGES/DRESSINGS) IMPLANT
ELECT REM PT RETURN 9FT ADLT (ELECTROSURGICAL) ×1
ELECTRODE REM PT RTRN 9FT ADLT (ELECTROSURGICAL) ×2 IMPLANT
GAUZE PAD ABD 8X10 STRL (GAUZE/BANDAGES/DRESSINGS) IMPLANT
GAUZE SPONGE 4X4 12PLY STRL (GAUZE/BANDAGES/DRESSINGS) IMPLANT
GLOVE BIO SURGEON STRL SZ 6.5 (GLOVE) ×6 IMPLANT
GLOVE BIO SURGEON STRL SZ7.5 (GLOVE) ×6 IMPLANT
GLOVE BIOGEL PI IND STRL 6.5 (GLOVE) ×2 IMPLANT
GLOVE BIOGEL PI IND STRL 7.5 (GLOVE) ×2 IMPLANT
GOWN STRL REUS W/ TWL LRG LVL3 (GOWN DISPOSABLE) ×6 IMPLANT
GOWN STRL REUS W/TWL LRG LVL3 (GOWN DISPOSABLE) ×3
K-WIRE 1.6 (WIRE) ×4
K-WIRE FX150X1.6XTROC PNT (WIRE) ×4
KIT BASIN OR (CUSTOM PROCEDURE TRAY) ×2 IMPLANT
KIT TURNOVER KIT B (KITS) ×2 IMPLANT
KWIRE FX150X1.6XTROC PNT (WIRE) IMPLANT
LOOP VESSEL MAXI 1X406 BLUE (MISCELLANEOUS) ×1
LOOP VESSEL MAXI BLUE (MISCELLANEOUS) ×2 IMPLANT
MANIFOLD NEPTUNE II (INSTRUMENTS) ×2 IMPLANT
NS IRRIG 1000ML POUR BTL (IV SOLUTION) ×2 IMPLANT
PACK ORTHO EXTREMITY (CUSTOM PROCEDURE TRAY) ×2 IMPLANT
PAD ARMBOARD 7.5X6 YLW CONV (MISCELLANEOUS) ×4 IMPLANT
PAD CAST 3X4 CTTN HI CHSV (CAST SUPPLIES) IMPLANT
PAD CAST 4YDX4 CTTN HI CHSV (CAST SUPPLIES) IMPLANT
PADDING CAST COTTON 3X4 STRL (CAST SUPPLIES) ×1
PADDING CAST COTTON 4X4 STRL (CAST SUPPLIES) ×1
PLATE HUM EVOS 3H L 2.7X80 (Plate) IMPLANT
PLATE HUM EVOS 7H L 2.7X90 (Plate) IMPLANT
SCREW CORT 3.5X20 ST EVOS (Screw) IMPLANT
SCREW CORT 3.5X22 ST EVOS (Screw) IMPLANT
SCREW CORT EVOS ST 3.5X28 (Screw) IMPLANT
SCREW CORTEX 3.5X26 (Screw) IMPLANT
SCREW EVOS 2.7 X 50 LCK T8 S-T (Screw) IMPLANT
SCREW LOCK 2.7X30 (Screw) IMPLANT
SCREW LOCK 2.7X38 (Screw) ×1 IMPLANT
SCREW LOCK 28X3.5XSTSTRL (Screw) IMPLANT
SCREW LOCK EVOS 2.7X32 (Screw) IMPLANT
SCREW LOCK EVOS 2.7X44 (Screw) IMPLANT
SCREW LOCK EVOS 2.7X46 (Screw) IMPLANT
SCREW LOCK ST EVOS 2.7X34 (Screw) IMPLANT
SCREW LOCK ST EVOS 2.7X36 (Screw) IMPLANT
SCREW LOCK T8 38X2.7XST (Screw) IMPLANT
SCREW LOCKING 3.5X28 (Screw) ×1 IMPLANT
SLING ARM FOAM STRAP LRG (SOFTGOODS) IMPLANT
SPLINT PLASTER CAST FAST 5X30 (CAST SUPPLIES) IMPLANT
SPONGE T-LAP 18X18 ~~LOC~~+RFID (SPONGE) IMPLANT
SUCTION FRAZIER HANDLE 10FR (MISCELLANEOUS) ×1
SUCTION TUBE FRAZIER 10FR DISP (MISCELLANEOUS) ×2 IMPLANT
SUT ETHILON 3 0 PS 1 (SUTURE) ×4 IMPLANT
SUT VIC AB 0 CT1 27 (SUTURE) ×1
SUT VIC AB 0 CT1 27XBRD ANBCTR (SUTURE) ×4 IMPLANT
SUT VIC AB 2-0 CT1 27 (SUTURE) ×2
SUT VIC AB 2-0 CT1 TAPERPNT 27 (SUTURE) ×4 IMPLANT
TOWEL GREEN STERILE (TOWEL DISPOSABLE) ×2 IMPLANT
WATER STERILE IRR 1000ML POUR (IV SOLUTION) ×2 IMPLANT
YANKAUER SUCT BULB TIP NO VENT (SUCTIONS) IMPLANT

## 2022-10-11 NOTE — Anesthesia Procedure Notes (Signed)
Anesthesia Regional Block: Supraclavicular block   Pre-Anesthetic Checklist: , timeout performed,  Correct Patient, Correct Site, Correct Laterality,  Correct Procedure, Correct Position, site marked,  Risks and benefits discussed,  Surgical consent,  Pre-op evaluation,  At surgeon's request and post-op pain management  Laterality: Left  Prep: chloraprep       Needles:  Injection technique: Single-shot  Needle Type: Stimiplex     Needle Length: 9cm  Needle Gauge: 21     Additional Needles:   Procedures:,,,, ultrasound used (permanent image in chart),,    Narrative:  Start time: 10/11/2022 10:10 AM End time: 10/11/2022 10:15 AM Injection made incrementally with aspirations every 5 mL.  Performed by: Personally  Anesthesiologist: Lynda Rainwater, MD

## 2022-10-11 NOTE — Progress Notes (Signed)
Patient receiving HHN TX at this time

## 2022-10-11 NOTE — Transfer of Care (Signed)
Immediate Anesthesia Transfer of Care Note  Patient: Melissa Golden  Procedure(s) Performed: OPEN REDUCTION INTERNAL FIXATION OF LEFT DISTAL HUMERUS FRACTURE (Left)  Patient Location: PACU  Anesthesia Type:GA combined with regional for post-op pain  Level of Consciousness: awake, alert , and oriented  Airway & Oxygen Therapy: Patient Spontanous Breathing and Patient connected to face mask oxygen  Post-op Assessment: Report given to RN and Post -op Vital signs reviewed and stable  Post vital signs: Reviewed and stable  Last Vitals:  Vitals Value Taken Time  BP 86/47 10/11/22 1245  Temp 98   Pulse 89 10/11/22 1246  Resp 17 10/11/22 1246  SpO2 96 % 10/11/22 1246  Vitals shown include unvalidated device data.  Last Pain:  Vitals:   10/11/22 0852  TempSrc:   PainSc: 5       Patients Stated Pain Goal: 2 (68/34/19 6222)  Complications: No notable events documented.

## 2022-10-11 NOTE — Op Note (Signed)
Orthopaedic Surgery Operative Note (CSN: [id number] ) Date of Surgery: 10/11/2022  Admit Date: 10/11/2022   Diagnoses: Pre-Op Diagnoses: Left supracondylar distal humerus fracture  Post-Op Diagnosis: Same  Procedures: CPT 24546-Open reduction internal fixation of left supracondylar distal humerus fracture  Surgeons : Primary: Shona Needles, MD  Assistant: Patrecia Pace, PA-C  Location: OR 3   Anesthesia: General with regional preoperative block   Antibiotics: Ancef 2g preop with 1 gm vancomycin powder placed topically   Tourniquet time: None    Estimated Blood Loss: 50 mL  Complications:None   Specimens:None   Implants: Implant Name Type Inv. Item Serial No. Manufacturer Lot No. LRB No. Used Action  SCREW CORTEX 3.5X26 - [id number] Screw SCREW CORTEX 3.5X26  [employer]  Left 2 Implanted  SCREW CORT 3.5X22 ST EVOS - [id number] Screw SCREW CORT 3.5X22 ST EVOS  [employer]  Left 1 Implanted  PLATE HUM EVOS 7H L 5.0P54 - [id number] Plate PLATE HUM EVOS 7H L 0.0F74  [employer]  Left 1 Implanted  PLATE HUM EVOS 3H L 9.4W96 - [id number] Plate PLATE HUM EVOS 3H L 5.9D35  [employer]  Left 1 Implanted  SCREW CORT EVOS ST 3.5X28 - [id number] Screw SCREW CORT EVOS ST 3.5X28  [employer]  Left 1 Implanted  SCREW LOCK 2.7X30 - [id number] Screw SCREW LOCK 2.7X30  [employer]  Left 2 Implanted  SCREW CORT 3.5X20 ST EVOS - [id number] Screw SCREW CORT 3.5X20 ST EVOS  [employer]  Left 1 Implanted  SCREW EVOS 2.7 X 50 LCK T8 S-T - [id number] Screw SCREW EVOS 2.7 X 50 LCK T8 S-T  [employer]  Left 1 Explanted  SCREW LOCK ST EVOS 2.7X36 - [id number] Screw SCREW LOCK ST EVOS 2.7X36  [employer]  Left 1 Implanted  SCREW LOCK 2.7X38 - [id number] Screw SCREW LOCK 2.7X38  [employer]  Left 1 Implanted  [place] EVOS 2.7X46 -  [id number] Screw SCREW LOCK EVOS 2.7X46  [employer]  Left 1 Implanted  [place] EVOS 2.7X44 - [id number] Screw SCREW LOCK EVOS 2.7X44  [employer]  Left 1 Explanted  SCREW LOCKING 3.5X28 - [id number] Screw SCREW LOCKING 3.5X28  [employer]  Left 1 Implanted  SCREW LOCK EVOS 2.7X32 - [id number] Screw SCREW LOCK EVOS 2.7X32  [employer]  Left 1 Implanted  SCREW LOCK ST EVOS 2.7X34 - [id number] Screw SCREW LOCK ST EVOS 2.7X34  [employer]  Left 1 Implanted  SCREW LOCK ST EVOS 2.7X36 - [id number] Screw SCREW LOCK ST EVOS 2.7X36  [employer]  Left 1 Explanted     Indications for Surgery: 76 year old female who sustained a left supracondylar distal humerus fracture.  Due to the unstable nature of her injury I recommend proceeding with open reduction internal fixation.  Risks and benefits were discussed with the patient and her husband.  Risks included but not limited to bleeding, infection, malunion, nonunion, hardware failure, hardware irritation, nerve or blood vessel injury, DVT, even the possibility anesthetic complications.  She agreed to proceed with surgery consent was obtained.  Operative Findings: Open reduction internal fixation of left supracondylar distal humerus fracture using [employer] EVOS distal humeral locking plates  Procedure: The patient was identified in the preoperative holding area. Consent was confirmed with the patient and their family and all questions were  answered. The operative extremity was marked after confirmation with the patient. she was then brought back to the operating room by our anesthesia colleagues.  She was carefully transferred over to radiolucent flat top table.  She was placed under general anesthetic.  She was then placed in lateral decubitus position with the left side up.  The left upper extremity was then prepped and draped in usual sterile fashion.   A timeout was performed to verify the patient, the procedure, and the extremity.  Preoperative antibiotics were dosed.  A direct posterior approach was made and carried down through skin and subcutaneous tissue.  I incised through the triceps fascia and mobilized the skin flaps to visualize the intermuscular septums bilaterally.  Identified the ulnar nerve and I dissected this out and carefully protected throughout the remainder of the case.  I then incised through the anconeus on the lateral side to visualize the distal portion of the fracture.  Was able to visualize the troche left underneath the triceps and the olecranon and I reduced the fracture using reduction tenaculums and held provisionally with K wires.  I confirmed decent reduction using AP and lateral fluoroscopy imaging.  Once I had adequate reduction I turned my attention to fixation.  Due to the significantly distal nature of the fracture as well as the involvement of the capitellum I felt that a direct lateral and direct medial plates were appropriate.  Placed these provisionally held with K wires and confirmed positioning with fluoroscopy.  I then placed nonlocking screws into the humeral shaft to bring the plate flush to bone.  Once I confirmed reduction and placement I then proceeded to place a mixture of nonlocking and locking screws into the humeral shaft.  I then proceeded to place 2.7 mm locking screws in the medial and lateral portion of the distal segment through the plates.  I took the elbow through range of motion under fluoroscopic imaging which showed that the fracture fragments were stable.  The incisions were then copiously irrigated.  Final fluoroscopic imaging was obtained.  A gram of vancomycin powder was placed into the incision.  A layered closure of 0 Vicryl, 2-0 Vicryl and 3-0 nylon was used to close the skin.  Sterile dressing was applied.  There is well-padded long-arm splint was placed.  The patient was then awoken from  anesthesia and taken to the PACU in stable condition.  Post Op Plan/Instructions: Patient will be admitted for observation.  She will receive Ancef postoperatively.  She will be placed on aspirin for DVT prophylaxis.  We will have her arm in the splint for at least 1 week and then return to the office for x-rays out of the splint and likely start some gentle range of motion.  I was present and performed the entire surgery.  Patrecia Pace, PA-C did assist me throughout the case. An assistant was necessary given the difficulty in approach, maintenance of reduction and ability to instrument the fracture.   Katha Hamming, MD [facility]

## 2022-10-11 NOTE — Progress Notes (Signed)
Orthopedic Tech Progress Note Patient Details:  Melissa Golden March 06, 1946 [id number]  Patient ID: [name], female   DOB: [birth year], 75 y.o.   MRN: [id number] No OHF; upper extremity injury. Vernona Rieger 10/11/2022, 6:54 PM

## 2022-10-11 NOTE — Interval H&P Note (Signed)
History and Physical Interval Note:  10/11/2022 9:38 AM  Melissa Golden  has presented today for surgery, with the diagnosis of Left distal humerus fracture.  The various methods of treatment have been discussed with the patient and family. After consideration of risks, benefits and other options for treatment, the patient has consented to  Procedure(s): OPEN REDUCTION INTERNAL FIXATION (ORIF) DISTAL HUMERUS FRACTURE (Left) as a surgical intervention.  The patient's history has been reviewed, patient examined, no change in status, stable for surgery.  I have reviewed the patient's chart and labs.  Questions were answered to the patient's satisfaction.     Lennette Bihari P Defne Gerling

## 2022-10-11 NOTE — Anesthesia Preprocedure Evaluation (Addendum)
Anesthesia Evaluation  Patient identified by MRN, date of birth, ID band Patient awake    Reviewed: Allergy & Precautions, H&P , NPO status , Patient's Chart, lab work & pertinent test results  Airway Mallampati: I  TM Distance: >3 FB Neck ROM: Full    Dental  (+) Edentulous Upper, Edentulous Lower, Dental Advisory Given   Pulmonary shortness of breath, at rest and Long-Term Oxygen Therapy, asthma , COPD,  COPD inhaler and oxygen dependent, former smoker   breath sounds clear to auscultation       Cardiovascular negative cardio ROS  Rhythm:Regular Rate:Normal     Neuro/Psych negative neurological ROS  negative psych ROS   GI/Hepatic negative GI ROS, Neg liver ROS,,,Colon cancer Appendiceal carcinoid   Endo/Other  negative endocrine ROS    Renal/GU negative Renal ROS  negative genitourinary   Musculoskeletal negative musculoskeletal ROS (+)    Abdominal   Peds negative pediatric ROS (+)  Hematology negative hematology ROS (+)   Anesthesia Other Findings   Reproductive/Obstetrics negative OB ROS                             Anesthesia Physical Anesthesia Plan  ASA: III  Anesthesia Plan: General   Post-op Pain Management: Minimal or no pain anticipated   Induction: Intravenous  PONV Risk Score and Plan: 2 and Ondansetron, Treatment may vary due to age or medical condition and Midazolam  Airway Management Planned: LMA  Additional Equipment: None  Intra-op Plan:   Post-operative Plan: Extubation in OR  Informed Consent: I have reviewed the patients History and Physical, chart, labs and discussed the procedure including the risks, benefits and alternatives for the proposed anesthesia with the patient or authorized representative who has indicated his/her understanding and acceptance.     Dental advisory given  Plan Discussed with: CRNA, Anesthesiologist and  Surgeon  Anesthesia Plan Comments:         Anesthesia Quick Evaluation

## 2022-10-11 NOTE — Anesthesia Postprocedure Evaluation (Signed)
Anesthesia Post Note  Patient: Melissa Golden  Procedure(s) Performed: OPEN REDUCTION INTERNAL FIXATION OF LEFT DISTAL HUMERUS FRACTURE (Left)     Patient location during evaluation: PACU Anesthesia Type: General Level of consciousness: awake and alert Pain management: pain level controlled Vital Signs Assessment: post-procedure vital signs reviewed and stable Respiratory status: spontaneous breathing, nonlabored ventilation and respiratory function stable Cardiovascular status: blood pressure returned to baseline and stable Postop Assessment: no apparent nausea or vomiting Anesthetic complications: no   No notable events documented.  Last Vitals:  Vitals:   10/11/22 1330 10/11/22 1400  BP: 128/69 (!) 111/53  Pulse: (!) 105 100  Resp: 18 15  Temp: 36.6 C   SpO2: 91% 96%    Last Pain:  Vitals:   10/11/22 1400  TempSrc:   PainSc: Asleep                 Lynda Rainwater

## 2022-10-11 NOTE — Anesthesia Procedure Notes (Signed)
Procedure Name: LMA Insertion Date/Time: 10/11/2022 10:40 AM  Performed by: Minerva Ends, CRNAPre-anesthesia Checklist: Patient identified, Emergency Drugs available, Suction available and Patient being monitored Patient Re-evaluated:Patient Re-evaluated prior to induction Oxygen Delivery Method: Circle system utilized Preoxygenation: Pre-oxygenation with 100% oxygen Induction Type: IV induction Ventilation: Mask ventilation without difficulty LMA: LMA inserted LMA Size: 4.0 Tube type: Oral Number of attempts: 1 Placement Confirmation: positive ETCO2 and breath sounds checked- equal and bilateral Tube secured with: Tape Dental Injury: Teeth and Oropharynx as per pre-operative assessment

## 2022-10-12 ENCOUNTER — Other Ambulatory Visit (HOSPITAL_COMMUNITY): Payer: Self-pay

## 2022-10-12 DIAGNOSIS — Z87891 Personal history of nicotine dependence: Secondary | ICD-10-CM | POA: Diagnosis not present

## 2022-10-12 DIAGNOSIS — J45909 Unspecified asthma, uncomplicated: Secondary | ICD-10-CM | POA: Diagnosis not present

## 2022-10-12 DIAGNOSIS — J449 Chronic obstructive pulmonary disease, unspecified: Secondary | ICD-10-CM | POA: Diagnosis not present

## 2022-10-12 DIAGNOSIS — S42412A Displaced simple supracondylar fracture without intercondylar fracture of left humerus, initial encounter for closed fracture: Secondary | ICD-10-CM | POA: Diagnosis not present

## 2022-10-12 DIAGNOSIS — Z85118 Personal history of other malignant neoplasm of bronchus and lung: Secondary | ICD-10-CM | POA: Diagnosis not present

## 2022-10-12 DIAGNOSIS — Z85038 Personal history of other malignant neoplasm of large intestine: Secondary | ICD-10-CM | POA: Diagnosis not present

## 2022-10-12 LAB — CBC
HCT: 23.2 % — ABNORMAL LOW (ref 36.0–46.0)
Hemoglobin: 7.5 g/dL — ABNORMAL LOW (ref 12.0–15.0)
MCH: 31.4 pg (ref 26.0–34.0)
MCHC: 32.3 g/dL (ref 30.0–36.0)
MCV: 97.1 fL (ref 80.0–100.0)
Platelets: 165 10*3/uL (ref 150–400)
RBC: 2.39 MIL/uL — ABNORMAL LOW (ref 3.87–5.11)
RDW: 11.9 % (ref 11.5–15.5)
WBC: 4.7 10*3/uL (ref 4.0–10.5)
nRBC: 0 % (ref 0.0–0.2)

## 2022-10-12 LAB — BASIC METABOLIC PANEL
Anion gap: 11 (ref 5–15)
BUN: 12 mg/dL (ref 8–23)
CO2: 32 mmol/L (ref 22–32)
Calcium: 8.3 mg/dL — ABNORMAL LOW (ref 8.9–10.3)
Chloride: 99 mmol/L (ref 98–111)
Creatinine, Ser: 0.64 mg/dL (ref 0.44–1.00)
GFR, Estimated: >60 mL/min (ref >60)
Glucose, Bld: 93 mg/dL (ref 70–99)
Potassium: 3.5 mmol/L (ref 3.5–5.1)
Sodium: 142 mmol/L (ref 135–145)

## 2022-10-12 MED ORDER — ASPIRIN 81 MG PO TBEC
81.0000 mg | DELAYED_RELEASE_TABLET | Freq: Every day | ORAL | 0 refills | Status: AC
  Filled 2022-10-12: qty 30, 30d supply, fill #0

## 2022-10-12 MED ORDER — ASPIRIN 81 MG PO TBEC
81.0000 mg | DELAYED_RELEASE_TABLET | Freq: Every day | ORAL | Status: DC
  Administered 2022-10-12: 81 mg via ORAL
  Filled 2022-10-12: qty 1

## 2022-10-12 MED ORDER — OXYCODONE HCL 5 MG PO TABS
5.0000 mg | ORAL_TABLET | ORAL | 0 refills | Status: DC | PRN
  Filled 2022-10-12: qty 42, 7d supply, fill #0

## 2022-10-12 NOTE — Plan of Care (Signed)
  Problem: Education: Goal: Knowledge of General Education information will improve Description: Including pain rating scale, medication(s)/side effects and non-pharmacologic comfort measures Outcome: Progressing   Problem: Health Behavior/Discharge Planning: Goal: Ability to manage health-related needs will improve Outcome: Progressing   Problem: Clinical Measurements: Goal: Will remain free from infection Outcome: Progressing Goal: Respiratory complications will improve Outcome: Progressing   Problem: Activity: Goal: Risk for activity intolerance will decrease Outcome: Progressing   Problem: Nutrition: Goal: Adequate nutrition will be maintained Outcome: Progressing   Problem: Pain Managment: Goal: General experience of comfort will improve Outcome: Progressing   Problem: Safety: Goal: Ability to remain free from injury will improve Outcome: Progressing   Problem: Skin Integrity: Goal: Risk for impaired skin integrity will decrease Outcome: Progressing

## 2022-10-12 NOTE — Discharge Summary (Signed)
[facility] (OTS) Discharge Summary   Patient ID: Melissa Golden MRN: [id number] DOB/AGE: 12-27-1945 76 y.o.  Admit date: 10/11/2022 Discharge date: 10/12/2022  Admission Diagnoses: Left distal humerus fracture  Discharge Diagnoses:  Principal Problem:   Closed fracture of left distal humerus   Past Medical History:  Diagnosis Date   Asthma    Cancer ([place]) 2017   colon   chemo tx   COPD (chronic obstructive pulmonary disease) ([place])    wears 3L home O2   Ileus following gastrointestinal surgery ([place]) 09/12/2017   required TPN support   Lung cancer ([place]) dx'd 10/2016   Pleural effusion, right    Shortness of breath    with exertion     Procedures Performed: ORIF left distal humerus  Discharged Condition: stable  Hospital Course: Patient [facility] 10/11/2022 for scheduled procedure left upper extremity.  Received in the operating room by Dr. Doreatha Martin for the above procedure.  She tolerated this well without complications.  Was placed in a long-arm splint postoperatively and instructed to be nonweightbearing on left upper extremity.  Was admitted overnight for observation, pain control, and therapies.  Patient started on aspirin for DVT prophylaxis starting on postoperative day #1.  She began working with physical and occupational therapy starting on postoperative day #1. On 10/12/2022, the patient was tolerating diet, working well with therapies, pain well controlled, vital signs stable, dressings clean, dry, intact and felt stable for discharge to home. Patient will follow up as below and knows to call with questions or concerns.     Consults: None  Significant Diagnostic Studies:   Results for orders placed or performed during the hospital encounter of 10/11/22 (from the past 168 hour(s))  Basic metabolic panel per protocol   Collection Time: 10/11/22  8:02 AM  Result Value Ref Range   Sodium 141 135 - 145 mmol/L    Potassium 3.8 3.5 - 5.1 mmol/L   Chloride 97 (L) 98 - 111 mmol/L   CO2 33 (H) 22 - 32 mmol/L   Glucose, Bld 92 70 - 99 mg/dL   BUN 7 (L) 8 - 23 mg/dL   Creatinine, Ser 0.49 0.44 - 1.00 mg/dL   Calcium 8.7 (L) 8.9 - 10.3 mg/dL   GFR, Estimated >60 >60 mL/min   Anion gap 11 5 - 15  CBC per protocol   Collection Time: 10/11/22  8:02 AM  Result Value Ref Range   WBC 6.2 4.0 - 10.5 K/uL   RBC 3.16 (L) 3.87 - 5.11 MIL/uL   Hemoglobin 9.9 (L) 12.0 - 15.0 g/dL   HCT 30.1 (L) 36.0 - 46.0 %   MCV 95.3 80.0 - 100.0 fL   MCH 31.3 26.0 - 34.0 pg   MCHC 32.9 30.0 - 36.0 g/dL   RDW 11.7 11.5 - 15.5 %   Platelets 199 150 - 400 K/uL   nRBC 0.0 0.0 - 0.2 %  CBC   Collection Time: 10/12/22  6:03 AM  Result Value Ref Range   WBC 4.7 4.0 - 10.5 K/uL   RBC 2.39 (L) 3.87 - 5.11 MIL/uL   Hemoglobin 7.5 (L) 12.0 - 15.0 g/dL   HCT 23.2 (L) 36.0 - 46.0 %   MCV 97.1 80.0 - 100.0 fL   MCH 31.4 26.0 - 34.0 pg   MCHC 32.3 30.0 - 36.0 g/dL   RDW 11.9 11.5 - 15.5 %   Platelets 165 150 - 400 K/uL   nRBC 0.0 0.0 -  0.2 %  Basic metabolic panel   Collection Time: 10/12/22  6:03 AM  Result Value Ref Range   Sodium 142 135 - 145 mmol/L   Potassium 3.5 3.5 - 5.1 mmol/L   Chloride 99 98 - 111 mmol/L   CO2 32 22 - 32 mmol/L   Glucose, Bld 93 70 - 99 mg/dL   BUN 12 8 - 23 mg/dL   Creatinine, Ser 0.64 0.44 - 1.00 mg/dL   Calcium 8.3 (L) 8.9 - 10.3 mg/dL   GFR, Estimated >60 >60 mL/min   Anion gap 11 5 - 15     Treatments: IV hydration, antibiotics: Ancef, analgesia: acetaminophen, Dilaudid, and oxycodone, anticoagulation: ASA, therapies: PT and OT, and surgery: As above  Discharge Exam: General: Sitting up in bed, eating breakfast.  No acute distress Respiratory: On 2 L O2 via nasal cannula.  No increased work of breathing at rest. Left upper extremity: Long-arm splint in place.  Nontender above splint.  Stable swelling and bruising about the hand and fingers.  Able to wiggle all fingers.  Endorses  sensation to light touch over the hand.  Fingers warm and well-perfused.  Brisk cap refill  Disposition: Discharge disposition: 01-Home or Self Care       Discharge Instructions     Call MD / Call 911   Complete by: As directed    If you experience chest pain or shortness of breath, CALL 911 and be transported to the hospital emergency room.  If you develope a fever above 101 F, pus (white drainage) or increased drainage or redness at the wound, or calf pain, call your surgeon's office.   Constipation Prevention   Complete by: As directed    Drink plenty of fluids.  Prune juice may be helpful.  You may use a stool softener, such as Colace (over the counter) 100 mg twice a day.  Use MiraLax (over the counter) for constipation as needed.   Diet - low sodium heart healthy   Complete by: As directed    Increase activity slowly as tolerated   Complete by: As directed    Post-operative opioid taper instructions:   Complete by: As directed    POST-OPERATIVE OPIOID TAPER INSTRUCTIONS: It is important to wean off of your opioid medication as soon as possible. If you do not need pain medication after your surgery it is ok to stop day one. Opioids include: Codeine, Hydrocodone(Norco, Vicodin), Oxycodone(Percocet, oxycontin) and hydromorphone amongst others.  Long term and even short term use of opiods can cause: Increased pain response Dependence Constipation Depression Respiratory depression And more.  Withdrawal symptoms can include Flu like symptoms Nausea, vomiting And more Techniques to manage these symptoms Hydrate well Eat regular healthy meals Stay active Use relaxation techniques(deep breathing, meditating, yoga) Do Not substitute Alcohol to help with tapering If you have been on opioids for less than two weeks and do not have pain than it is ok to stop all together.  Plan to wean off of opioids This plan should start within one week post op of your joint  replacement. Maintain the same interval or time between taking each dose and first decrease the dose.  Cut the total daily intake of opioids by one tablet each day Next start to increase the time between doses. The last dose that should be eliminated is the evening dose.         Allergies as of 10/12/2022   No Known Allergies      Medication List  STOP taking these medications    ibuprofen 800 MG tablet Commonly known as: ADVIL       TAKE these medications    acetaminophen 500 MG tablet Commonly known as: TYLENOL Take 2 tablets (1,000 mg total) by mouth every 6 (six) hours.   albuterol (2.5 MG/3ML) 0.083% nebulizer solution Commonly known as: PROVENTIL Take 3 mLs (2.5 mg total) by nebulization every 4 (four) hours as needed for wheezing or shortness of breath.   Ventolin HFA 108 (90 Base) MCG/ACT inhaler Generic drug: albuterol Inhale 1-2 puffs into the lungs every 4 (four) hours as needed for wheezing or shortness of breath.   aspirin EC 81 MG tablet Take 1 tablet (81 mg total) by mouth daily. Swallow whole.   benzonatate 200 MG capsule Commonly known as: TESSALON Take 1 capsule (200 mg total) by mouth 3 (three) times daily as needed for cough.   COQ-10 PO Take 1 tablet by mouth daily.   Flutter Devi 1 Device by Does not apply route as needed.   lidocaine-prilocaine cream Commonly known as: EMLA Apply to portacath 30 min to 1 hour prior to flush or infusions   montelukast 10 MG tablet Commonly known as: SINGULAIR Take 1 tablet (10 mg total) by mouth at bedtime.   multivitamin with minerals Tabs tablet Take 1 tablet by mouth daily.   oxyCODONE 5 MG immediate release tablet Commonly known as: Oxy IR/ROXICODONE Take 1 tablet (5 mg total) by mouth every 4 (four) hours as needed for severe pain. What changed: reasons to take this   OXYGEN Inhale 2 L into the lungs continuous. For COPD   Trelegy Ellipta 100-62.5-25 MCG/ACT Aepb Generic drug:  Fluticasone-Umeclidin-Vilant Inhale 1 puff into the lungs daily.   vitamin C 100 MG tablet Take 100 mg by mouth 2 (two) times daily with a meal.        Follow-up Information     Haddix, Thomasene Lot, MD. Daphane Shepherd on 10/23/2022.   Specialty: Orthopedic Surgery Why: for splint removal and repeat x-rays Contact information: [place] [address] [phone]                 Discharge Instructions and Plan: Patient will be discharged to home. Will be discharged on Aspirin for DVT prophylaxis. Patient has been provided with all the necessary DME for discharge. Patient will follow up with Dr. Doreatha Martin in 1 week for repeat x-rays, suture removal, splint removal.  We will plan to arrange outpatient physical therapy at that time   Signed:  Gwinda Passe, PA-C ?[phone]? (phone) 10/12/2022, 9:33 AM  [facility] [place] [address] [phone] Domingo Sep (F)

## 2022-10-12 NOTE — Evaluation (Signed)
Occupational Therapy Evaluation Patient Details Name: Melissa Golden MRN: [id number] DOB: [birth year] Today's Date: 10/12/2022   History of Present Illness Open reduction internal fixation of left supracondylar distal humerus fracture. pmhL COPD, LUNG CANCER. [name] WAS ON O2 AT HOME 2 L.   Clinical Impression   Pt. Ed on elevation of l ue for edema management. Pt. Ed to move l hand digits for rom and edema management. Pt. Is requiring extensive assist with adls and states her husband is able to assist as needed. Pt. Was cga with sit to stand eob and to take a few steps. Pt. Refused to amb in room today. Pt. States she was going home today and she felt confident that her husband was able to assist at current level. Pt. States me is going to sent her to outpnt next week. No further ot      Recommendations for follow up therapy are one component of a multi-disciplinary discharge planning process, led by the attending physician.  Recommendations may be updated based on patient status, additional functional criteria and insurance authorization.   Follow Up Recommendations  Follow physician's recommendations for discharge plan and follow up therapies     Assistance Recommended at Discharge Frequent or constant Supervision/Assistance  Patient can return home with the following A little help with walking and/or transfers;A lot of help with bathing/dressing/bathroom;Assistance with feeding    Functional Status Assessment  Patient has had a recent decline in their functional status and demonstrates the ability to make significant improvements in function in a reasonable and predictable amount of time.  Equipment Recommendations  None recommended by OT    Recommendations for Other Services       Precautions / Restrictions Precautions Precautions: Fall Required Braces or Orthoses: Sling Restrictions Weight Bearing Restrictions: Yes LUE Weight Bearing: Non weight bearing      Mobility Bed  Mobility Overal bed mobility: Needs Assistance Bed Mobility: Supine to Sit, Sit to Supine     Supine to sit: Supervision Sit to supine: Supervision        Transfers Overall transfer level: Needs assistance     Sit to Stand: Min guard                  Balance                                           ADL either performed or assessed with clinical judgement   ADL Overall ADL's : Needs assistance/impaired Eating/Feeding: Set up   Grooming: Wash/dry face;Wash/dry hands;Minimal assistance;Sitting   Upper Body Bathing: Minimal assistance;Sitting   Lower Body Bathing: Moderate assistance;Sit to/from stand   Upper Body Dressing : Moderate assistance;Sitting   Lower Body Dressing: Moderate assistance;Sit to/from stand   Toilet Transfer: Min guard;Stand-pivot   Toileting- Water quality scientist and Hygiene: Moderate assistance;Sit to/from stand       Functional mobility during ADLs: Min guard (pt. deferred getting to chair or amb.) General ADL Comments: pt. states her husband is able to assist at current. level.     Vision Baseline Vision/History: 1 Wears glasses Ability to See in Adequate Light: 0 Adequate Patient Visual Report: No change from baseline       Perception     Praxis      Pertinent Vitals/Pain Pain Assessment Pain Assessment: 0-10 Pain Score: 5  Pain Location: L ELBOW Pain Descriptors / Indicators: Aching  Pain Intervention(s): Monitored during session, Premedicated before session     Hand Dominance Right   Extremity/Trunk Assessment Upper Extremity Assessment Upper Extremity Assessment:  (R UE IS  WNL. [name] ABLE TO WIGGLE DIGITS. CASTED HAND TO UPPER ARM.)           Communication Communication Communication: No difficulties   Cognition Arousal/Alertness: Awake/alert Behavior During Therapy: WFL for tasks assessed/performed Overall Cognitive Status: Within Functional Limits for tasks assessed                                        General Comments       Exercises Exercises:  (pt. ed to wiggle digits on hand and try to make fist and release)   Shoulder Instructions      Home Living Family/patient expects to be discharged to:: Private residence Living Arrangements: Spouse/significant other Available Help at Discharge: Family Type of Home: Mobile home Home Access: Stairs to enter Entrance Stairs-Number of Steps: 8 Entrance Stairs-Rails: Can reach both [place]: One level     Bathroom Shower/Tub: Teacher, early years/pre: [place]: Conservation officer, nature (2 wheels);[place] held shower head;Shower seat          Prior Functioning/Environment Prior Level of Function : Independent/Modified Independent;History of Falls (last six months)                        OT Problem List: Decreased range of motion;Decreased activity tolerance      OT Treatment/Interventions:      OT Goals(Current goals can be found in the care plan section) Acute Rehab OT Goals Patient Stated Goal: to go home today OT Goal Formulation: With patient  OT Frequency:      Co-evaluation              AM-PAC OT "6 Clicks" Daily Activity     Outcome Measure Help from another person eating meals?: A Little Help from another person taking care of personal grooming?: A Little Help from another person toileting, which includes using toliet, bedpan, or urinal?: A Lot Help from another person bathing (including washing, rinsing, drying)?: A Lot Help from another person to put on and taking off regular upper body clothing?: A Lot Help from another person to put on and taking off regular lower body clothing?: A Lot 6 Click Score: 14   End of Session Nurse Communication:  (ok therapy)  Activity Tolerance: Patient tolerated treatment well Patient left: in bed;with call bell/phone within reach;with bed alarm set  OT Visit Diagnosis: Repeated falls (R29.6)                 Time: 8563-1497 OT Time Calculation (min): 25 min Charges:  OT General Charges $OT Visit: 1 Visit OT Evaluation $OT Eval Moderate Complexity: 1 Mod  Reece Packer OT/L   [place] 10/12/2022, 12:37 PM

## 2022-10-12 NOTE — Discharge Instructions (Signed)
Katha Hamming, MD Patrecia Pace PA-C [facility] [place] [phone] Asher [phone] (fax)                                  POST-OPERATIVE INSTRUCTIONS   WEIGHT BEARING STATUS: Non-weightbearing left upper extremity  RANGE OF MOTION/ACTIVITY: Maintain splint. Wiggle fingers frequently. Use sling as needed for comfort  WOUND CARE Please keep splint clean dry and intact until follow-up. If your splint gets wet for any reason please contact the office immediately.  Do not stick anything down your splint such as pencils, momey, hangers to try and scratch yourself.  If you feel itchy take Benadryl as prescribed on the bottle for itching You may shower on Post-Op Day #2.  You must keep splint dry during this process and may find that a plastic bag taped around the extremity or alternatively a towel based bath may be a better option.   If you get your splint wet or if it is damaged please contact our clinic.  EXERCISES Due to your splint being in place you will not be able to bear weight through your extremity.   Please use your sling until follow-up. Please continue to work on range of motion of your fingers and stretch these multiple times a day to prevent stiffness. Please continue to ambulate and do not stay sitting or lying for too long. Perform foot and wrist pumps to assist in circulation.  DVT/PE prophylaxis: Aspirin 81 mg daily x 30 days  DIET: As you were eating previously.  Can use over the counter stool softeners and bowel preparations, such as Miralax, to help with bowel movements.  Narcotics can be constipating.  Be sure to drink plenty of fluids  REGIONAL ANESTHESIA (NERVE BLOCKS) The anesthesia team may have performed a nerve block for you if safe in the setting of your care.  This is a great tool used to minimize pain.  Typically the block may start wearing off overnight but the long acting medicine may last for 3-4 days.  The nerve block  wearing off can be a challenging period but please utilize your as needed pain medications to try and manage this period.    ICE AND ELEVATE INJURED/OPERATIVE EXTREMITY Using ice and elevating the injured extremity above your heart can help with swelling and pain control.  Icing in a pulsatile fashion, such as 20 minutes on and 20 minutes off, can be followed.   Do not place ice directly on skin. Make sure there is a barrier between to skin and the ice pack.   Using frozen items such as frozen peas works well as the conform nicely to the are that needs to be iced.  POST-OP MEDICATIONS- Multimodal approach to pain control In general your pain will be controlled with a combination of substances.  Prescriptions unless otherwise discussed are electronically sent to your pharmacy.  This is a carefully made plan we use to minimize narcotic use.                     -   Acetaminophen - Non-narcotic pain medicine taken on a scheduled basis        -   Oxycodone - This is a strong narcotic, to be used only on an "as needed" basis for pain.  STOP SMOKING OR USING NICOTINE PRODUCTS!!!!  As discussed nicotine severely impairs your body's ability to heal surgical and traumatic wounds but also impairs bone healing.  Wounds and bone heal by forming microscopic blood vessels (angiogenesis) and nicotine is a vasoconstrictor (essentially, shrinks blood vessels).  Therefore, if vasoconstriction occurs to these microscopic blood vessels they essentially disappear and are unable to deliver necessary nutrients to the healing tissue.  This is one modifiable factor that you can do to dramatically increase your chances of healing your injury.    (This means no smoking, no nicotine gum, patches, etc)   FOLLOW-UP If you develop a Fever (>101.5), Redness or Drainage from the surgical incision site, please call our office to arrange for an evaluation. Please call the office to schedule a follow-up appointment  for your incision check if you do not already have one, 7-10 days post-operatively.  CALL THE OFFICE WITH ANY QUESTIONS OR CONCERNS: [phone]   VISIT OUR WEBSITE FOR ADDITIONAL INFORMATION: [email or web address]    OTHER HELPFUL INFORMATION  If you had a block, it will wear off between 8-24 hrs postop typically.  This is period when your pain may go from nearly zero to the pain you would have had postop without the block.  This is an abrupt transition but nothing dangerous is happening.  You may take an extra dose of narcotic when this happens.  You may be more comfortable sleeping in a semi-seated position the first few nights following surgery.  Keep a pillow propped under the elbow and forearm for comfort.  If you have a recliner type of chair it might be beneficial.  If not that is fine too, but it would be helpful to sleep propped up with pillows behind your operated shoulder as well under your elbow and forearm.  This will reduce pulling on the suture lines.  When dressing, put your operative arm in the sleeve first.  When getting undressed, take your operative arm out last.  Loose fitting, button-down shirts are recommended.  Often in the first days after surgery you may be more comfortable keeping your operative arm under your shirt and not through the sleeve.  You may return to work/school in the next couple of days when you feel up to it.  Desk work and typing in the sling is fine.  We suggest you use the pain medication the first night prior to going to bed, in order to ease any pain when the anesthesia wears off. You should avoid taking pain medications on an empty stomach as it will make you nauseous. You should wean off your narcotic medicines as soon as you are able.  Most patients will be off or using minimal narcotics before their first postop appointment.   Do not drink alcoholic beverages or take illicit drugs when taking pain medications.  It is against the law to drive  while taking narcotics.  In some states it is against the law to drive while your arm is in a sling.   Pain medication may make you constipated.  Below are a few solutions to try in this order: Decrease the amount of pain medication if you aren't having pain. Drink lots of decaffeinated fluids. Drink prune juice and/or each dried prunes  If the first 3 don't work start with additional solutions -   Take Colace - an over-the-counter stool softener -   Take Senokot - an over-the-counter laxative -   Take Miralax - a stronger over-the-counter laxative

## 2022-10-16 DIAGNOSIS — J440 Chronic obstructive pulmonary disease with acute lower respiratory infection: Secondary | ICD-10-CM | POA: Diagnosis not present

## 2022-10-16 DIAGNOSIS — F17210 Nicotine dependence, cigarettes, uncomplicated: Secondary | ICD-10-CM | POA: Diagnosis not present

## 2022-10-16 DIAGNOSIS — Z13220 Encounter for screening for lipoid disorders: Secondary | ICD-10-CM | POA: Diagnosis not present

## 2022-10-16 DIAGNOSIS — I714 Abdominal aortic aneurysm, without rupture, unspecified: Secondary | ICD-10-CM | POA: Diagnosis not present

## 2022-10-16 DIAGNOSIS — J439 Emphysema, unspecified: Secondary | ICD-10-CM | POA: Diagnosis not present

## 2022-10-16 DIAGNOSIS — R5383 Other fatigue: Secondary | ICD-10-CM | POA: Diagnosis not present

## 2022-10-17 ENCOUNTER — Encounter (HOSPITAL_COMMUNITY): Payer: Self-pay | Admitting: Student

## 2022-10-17 DIAGNOSIS — S42402D Unspecified fracture of lower end of left humerus, subsequent encounter for fracture with routine healing: Secondary | ICD-10-CM | POA: Diagnosis not present

## 2022-10-17 DIAGNOSIS — M79642 Pain in left hand: Secondary | ICD-10-CM | POA: Diagnosis not present

## 2022-10-17 DIAGNOSIS — S42412D Displaced simple supracondylar fracture without intercondylar fracture of left humerus, subsequent encounter for fracture with routine healing: Secondary | ICD-10-CM | POA: Diagnosis not present

## 2022-10-26 ENCOUNTER — Ambulatory Visit: Payer: Medicare Other | Admitting: Emergency Medicine

## 2022-10-30 ENCOUNTER — Ambulatory Visit (INDEPENDENT_AMBULATORY_CARE_PROVIDER_SITE_OTHER): Payer: Medicare Other | Admitting: Pulmonary Disease

## 2022-10-30 ENCOUNTER — Encounter: Payer: Self-pay | Admitting: Pulmonary Disease

## 2022-10-30 VITALS — BP 118/68 | HR 100 | Temp 98.4°F | Ht 62.0 in | Wt 127.6 lb

## 2022-10-30 DIAGNOSIS — J9611 Chronic respiratory failure with hypoxia: Secondary | ICD-10-CM | POA: Diagnosis not present

## 2022-10-30 DIAGNOSIS — Z85118 Personal history of other malignant neoplasm of bronchus and lung: Secondary | ICD-10-CM | POA: Diagnosis not present

## 2022-10-30 DIAGNOSIS — J479 Bronchiectasis, uncomplicated: Secondary | ICD-10-CM | POA: Diagnosis not present

## 2022-10-30 DIAGNOSIS — J449 Chronic obstructive pulmonary disease, unspecified: Secondary | ICD-10-CM | POA: Diagnosis not present

## 2022-10-30 DIAGNOSIS — Z720 Tobacco use: Secondary | ICD-10-CM

## 2022-10-30 DIAGNOSIS — Z87891 Personal history of nicotine dependence: Secondary | ICD-10-CM

## 2022-10-30 MED ORDER — VENTOLIN HFA 108 (90 BASE) MCG/ACT IN AERS: 1.0000 | INHALATION_SPRAY | RESPIRATORY_TRACT | 11 refills | Status: AC | PRN

## 2022-10-30 MED ORDER — TRELEGY ELLIPTA 100-62.5-25 MCG/ACT IN AEPB: 1.0000 | INHALATION_SPRAY | Freq: Every day | RESPIRATORY_TRACT | 5 refills | Status: DC

## 2022-10-30 NOTE — Addendum Note (Signed)
Addended by: Gavin Potters R on: 10/30/2022 04:02 PM   Modules accepted: Orders

## 2022-10-30 NOTE — Progress Notes (Signed)
Synopsis: Referred in September 2020 for COPD management by Curlene Labrum, MD  Subjective:   PATIENT ID: Melissa Golden GENDER: female DOB: [birth year], MRN: [id number]  Chief Complaint  Patient presents with   Follow-up    SOB/ cough     76 year old female past medical history of non-small cell lung cancer.  Diagnosed in 2018 with 3 hyper medic right upper lobe pulmonary nodules.  Had a CT-guided biopsy of the upper lobe nodule in December 2018 consistent with non-small cell lung cancer, TTF-1 positive p63 negative, PD-L1 95%.  In February 2019 had new mediastinal adenopathy within the prevascular and right paratracheal lymph node.  Biopsy of the paratracheal lymph node revealed metastatic lung primary.  Patient underwent 6 weeks of Taxol carboplatinum followed by durvalumab.  Repeat CT chest in June 2020 with evidence of right lung radiation and stable nodules.  Patient also has a history of adenocarcinoma/goblet cell carcinoid of the appendix.  This was in 2018 as well.  Patient underwent laparoscopic right hemicolectomy at [facility].  Patient underwent adjuvant Xeloda.  Patient is followed here in [facility] by Dr. Benay Spice her oncologist.  Patient was referred to see me for COPD management.  And complaints of chronic congestion.  OV 08/13/2019: First diagnosed with COPD back in 2018. Currently on pulmicort + albuterol. She is using her albuterol 3 times per day. She has a travel neb machine but it doesn't work. No fevers, chills. She has a chronic cough. Occassional brings up sputum. She feels like she wheezes all of the time. She lives in [place]. Lives with her husband. Former smoker, quit in 2017, smoked for 50+ years, 1.5-2ppd.  Patient is on 2 L nasal cannula pulse POC.  OV 09/24/2019: Here today for follow-up regarding lung cancer COPD.Patient last seen in September 2020.  Patient doing well today.  Has been using her new inhaler regimen.  Currently using Symbicort plus Spiriva Respimat  and as needed albuterol.  Her breathing is much improved as where she was before.  She would like to have a nebulizer at home.  She has a older machine but no tubing to connect to it.  We will get her DME supplies for this today.  She is able to keep most of her activities of daily living.  She does get dyspneic with significant exertion.  Denies fever chills night sweats weight loss denies hemoptysis.  OV 10/14/2020: Patient here today for follow-up of COPD.  Please see documented lung cancer and colon cancer history as above.  Overall doing well.  She is on Symbicort plus Spiriva at this time.  She felt like she was doing much better on Trelegy.  But due to insurance she had to switch back.  She would like to try again see if insurance would pay for Trelegy if at all possible.  She did better with this.  From a respiratory standpoint still has ongoing cough.  This is not much different from her previous cough but she seems to be annoyed by nonproductive.  Patient denies hemoptysis weight loss.  She had an recent noncontrasted CT of the chest which revealed stability of lung nodule.  This was reviewed with her and her oncologist recently.  OV 04/27/2021: doing well today. Breathing stable. Doing well with trelegy. Occasional wheezing. No chest tightness.  Patient doing well today overall.  Needs refills of all of her inhaler medications.  She has follow-up with oncology this week as well.  Going on vacation this summer.  OV  10/30/2022: From respiratory standpoint she is doing okay.  Has been on triple therapy with Trelegy.  She is concerned about cost.  Wondering if there is an alternative.  She uses her oxygen regularly.  Here with her husband today.     Past Medical History:  Diagnosis Date   Asthma    Cancer ([place]) 2017   colon   chemo tx   COPD (chronic obstructive pulmonary disease) ([place])    wears 3L home O2   Ileus following gastrointestinal surgery ([place]) 09/12/2017   required TPN support    Lung cancer ([place]) dx'd 10/2016   Pleural effusion, right    Shortness of breath    with exertion     Family History  Problem Relation Age of Onset   Cancer Mother    Heart attack Father    Cancer - Colon Sister      Past Surgical History:  Procedure Laterality Date   ABDOMINAL HYSTERECTOMY     APPENDECTOMY     BREAST SURGERY     Left cyst removal   CATARACT EXTRACTION W/ INTRAOCULAR LENS IMPLANT     Right   CATARACT EXTRACTION W/PHACO  01/02/2013   Procedure: CATARACT EXTRACTION PHACO AND INTRAOCULAR LENS PLACEMENT ([place]);  Surgeon: Tonny Branch, MD;  Location: [facility];  Service: Ophthalmology;  Laterality: Left;  CDE: 16.19   COLON SURGERY     DILATION AND CURETTAGE OF UTERUS     EYE SURGERY     IR IMAGING GUIDED PORT INSERTION  05/31/2018   IR THORACENTESIS ASP PLEURAL SPACE W/IMG GUIDE  02/19/2018   LAPAROSCOPIC APPENDECTOMY  09/12/2017   laparoscopic hemicolectomy Right 12/07/2017   DR. Stitzenberg and [facility]   LUNG BIOPSY     LYMPH NODE BIOPSY N/A 02/04/2018   Procedure: LYMPH NODE BIOPSY;  Surgeon: Gaye Pollack, MD;  Location: [facility];  Service: Thoracic;  Laterality: N/A;   MEDIASTINOSCOPY N/A 02/04/2018   Procedure: MEDIASTINOSCOPY;  Surgeon: Gaye Pollack, MD;  Location: [facility] OR;  Service: Thoracic;  Laterality: N/A;   ORIF HUMERUS FRACTURE Left 10/11/2022   Procedure: OPEN REDUCTION INTERNAL FIXATION OF LEFT DISTAL HUMERUS FRACTURE;  Surgeon: Shona Needles, MD;  Location: [facility];  Service: Orthopedics;  Laterality: Left;   PLEURAL EFFUSION DRAINAGE Right 03/01/2018   Procedure: DRAINAGE OF PLEURAL EFFUSION;  Surgeon: Gaye Pollack, MD;  Location: [facility];  Service: Thoracic;  Laterality: Right;   VIDEO ASSISTED THORACOSCOPY Right 03/01/2018   Procedure: VIDEO ASSISTED THORACOSCOPY;  Surgeon: Gaye Pollack, MD;  Location: [facility];  Service: Thoracic;  Laterality: Right;    Social History   Socioeconomic History   Marital status: Married    Spouse name: Not  on file   Number of children: Not on file   Years of education: Not on file   Highest education level: Not on file  Occupational History   Occupation: retired  Tobacco Use   Smoking status: Former    Packs/day: 1.50    Years: 68.00    Total pack years: 102.00    Types: Cigarettes    Quit date: 10/2016    Years since quitting: 6.0   Smokeless tobacco: Never  Vaping Use   Vaping Use: Never used  Substance and Sexual Activity   Alcohol use: Not Currently    Alcohol/week: 6.0 standard drinks of alcohol    Types: 6 Cans of beer per week   Drug use: No   Sexual activity: Not on file  Other  Topics Concern   Not on file  Social History Narrative   Not on file   Social Determinants of Health   Financial Resource Strain: Not on file  Food Insecurity: No Food Insecurity (10/11/2022)   Hunger Vital Sign    Worried About Running Out of Food in the Last Year: Never true    Ran Out of Food in the Last Year: Never true  Transportation Needs: No Transportation Needs (10/11/2022)   PRAPARE - Hydrologist (Medical): No    Lack of Transportation (Non-Medical): No  Physical Activity: Not on file  Stress: Not on file  Social Connections: Not on file  Intimate Partner Violence: Not At Risk (10/11/2022)   Humiliation, Afraid, Rape, and Kick questionnaire    Fear of Current or Ex-Partner: No    Emotionally Abused: No    Physically Abused: No    Sexually Abused: No     No Known Allergies   Outpatient Medications Prior to Visit  Medication Sig Dispense Refill   acetaminophen (TYLENOL) 500 MG tablet Take 2 tablets (1,000 mg total) by mouth every 6 (six) hours. 30 tablet 0   albuterol (PROVENTIL) (2.5 MG/3ML) 0.083% nebulizer solution Take 3 mLs (2.5 mg total) by nebulization every 4 (four) hours as needed for wheezing or shortness of breath. 75 mL 11   Ascorbic Acid (VITAMIN C) 100 MG tablet Take 100 mg by mouth 2 (two) times daily with a meal.     aspirin EC  81 MG tablet Take 1 tablet (81 mg total) by mouth daily. Swallow whole. 30 tablet 0   benzonatate (TESSALON) 200 MG capsule Take 1 capsule (200 mg total) by mouth 3 (three) times daily as needed for cough. 90 capsule 0   Coenzyme Q10 (COQ-10 PO) Take 1 tablet by mouth daily.     Fluticasone-Umeclidin-Vilant (TRELEGY ELLIPTA) 100-62.5-25 MCG/ACT AEPB Inhale 1 puff into the lungs daily. 60 each 5   lidocaine-prilocaine (EMLA) cream Apply to portacath 30 min to 1 hour prior to flush or infusions 30 g 1   montelukast (SINGULAIR) 10 MG tablet Take 1 tablet (10 mg total) by mouth at bedtime. 90 tablet 5   Multiple Vitamin (MULTIVITAMIN WITH MINERALS) TABS Take 1 tablet by mouth daily.     oxyCODONE (OXY IR/ROXICODONE) 5 MG immediate release tablet Take 1 tablet (5 mg total) by mouth every 4 (four) hours as needed for severe pain. 42 tablet 0   OXYGEN Inhale 2 L into the lungs continuous. For COPD     Respiratory Therapy Supplies (FLUTTER) DEVI 1 Device by Does not apply route as needed. 1 each 0   VENTOLIN HFA 108 (90 Base) MCG/ACT inhaler Inhale 1-2 puffs into the lungs every 4 (four) hours as needed for wheezing or shortness of breath. 18 g 11   No facility-administered medications prior to visit.    Review of Systems  Constitutional:  Negative for chills, fever, malaise/fatigue and weight loss.  HENT:  Negative for hearing loss, sore throat and tinnitus.   Eyes:  Negative for blurred vision and double vision.  Respiratory:  Positive for cough and shortness of breath. Negative for hemoptysis, sputum production, wheezing and stridor.   Cardiovascular:  Negative for chest pain, palpitations, orthopnea, leg swelling and PND.  Gastrointestinal:  Negative for abdominal pain, constipation, diarrhea, heartburn, nausea and vomiting.  Genitourinary:  Negative for dysuria, hematuria and urgency.  Musculoskeletal:  Negative for joint pain and myalgias.  Skin:  Negative for itching  and rash.  Neurological:   Negative for dizziness, tingling, weakness and headaches.  Endo/Heme/Allergies:  Negative for environmental allergies. Does not bruise/bleed easily.  Psychiatric/Behavioral:  Negative for depression. The patient is not nervous/anxious and does not have insomnia.   All other systems reviewed and are negative.    Objective:  Physical Exam Vitals reviewed.  Constitutional:      General: She is not in acute distress.    Appearance: She is well-developed.  HENT:     Head: Normocephalic and atraumatic.     Mouth/Throat:     Pharynx: No oropharyngeal exudate.  Eyes:     Conjunctiva/sclera: Conjunctivae normal.     Pupils: Pupils are equal, round, and reactive to light.  Neck:     Vascular: No JVD.     Trachea: No tracheal deviation.     Comments: Loss of supraclavicular fat Cardiovascular:     Rate and Rhythm: Normal rate and regular rhythm.     Heart sounds: S1 normal and S2 normal.     Comments: Distant heart tones Pulmonary:     Effort: No tachypnea or accessory muscle usage.     Breath sounds: No stridor. Decreased breath sounds (throughout all lung fields) and rhonchi present. No wheezing or rales.     Comments: Diminished breath sounds bilaterally, some rhonchi clear with coughing. Abdominal:     General: Bowel sounds are normal. There is no distension.     Palpations: Abdomen is soft.     Tenderness: There is no abdominal tenderness.  Musculoskeletal:        General: Deformity (muscle wasting ) present.  Skin:    General: Skin is warm and dry.     Capillary Refill: Capillary refill takes less than 2 seconds.     Findings: No rash.  Neurological:     Mental Status: She is alert and oriented to person, place, and time.  Psychiatric:        Behavior: Behavior normal.      Vitals:   10/30/22 1524  BP: 118/68  Pulse: 100  Temp: 98.4 F (36.9 C)  TempSrc: Oral  SpO2: (!) 2%  Weight: 127 lb 9.6 oz (57.9 kg)  Height: _0  (1.575 m)   (!) 2% on RA BMI Readings  from Last 3 Encounters:  10/30/22 23.34 kg/m  10/11/22 23.76 kg/m  08/24/22 24.03 kg/m   Wt Readings from Last 3 Encounters:  10/30/22 127 lb 9.6 oz (57.9 kg)  10/11/22 132 lb (59.9 kg)  08/24/22 131 lb 6.4 oz (59.6 kg)     CBC    Component Value Date/Time   WBC 4.7 10/12/2022 0603   RBC 2.39 (L) 10/12/2022 0603   HGB 7.5 (L) 10/12/2022 0603   HGB 12.6 07/19/2021 1113   HCT 23.2 (L) 10/12/2022 0603   PLT 165 10/12/2022 0603   PLT 138 (L) 07/19/2021 1113   MCV 97.1 10/12/2022 0603   MCH 31.4 10/12/2022 0603   MCHC 32.3 10/12/2022 0603   RDW 11.9 10/12/2022 0603   LYMPHSABS 0.8 07/19/2021 1113   MONOABS 0.4 07/19/2021 1113   EOSABS 0.0 07/19/2021 1113   BASOSABS 0.0 07/19/2021 1113    Chest Imaging: June 2020 CT chest: Paramediastinal right upper lobe lung radiation no evidence of recurrent tumor.  No evidence of metastatic disease.  Moderate amount of centrilobular emphysema. The patient's images have been independently reviewed by me.    CT chest 10/04/2020: Patient has evidence of significant emphysema within the chest.  She has  a small remnant of a loculated pleural effusion with the right hemithorax.  There is mild nodular density within the left lower lobe unchanged from previous CT imaging. The patient's images have been independently reviewed by me.    Pulmonary Functions Testing Results:    Latest Ref Rng & Units 10/27/2021    9:52 AM 12/05/2016    8:42 AM  PFT Results  FVC-Pre L 1.55  1.32   FVC-Predicted Pre % 58  46   FVC-Post L 1.51  1.74   FVC-Predicted Post % 56  61   Pre FEV1/FVC % % 47  45   Post FEV1/FCV % % 48  47   FEV1-Pre L 0.73  0.60   FEV1-Predicted Pre % 36  27   FEV1-Post L 0.73  0.81   DLCO uncorrected ml/min/mmHg 6.88  8.30   DLCO UNC% % 37  37   DLCO corrected ml/min/mmHg 6.88    DLCO COR %Predicted % 37    DLVA Predicted % 53  51   TLC L 4.36  5.50   TLC % Predicted % 90  113   RV % Predicted % 119  186     FeNO: None    Pathology:  CT Guided BX 2018: Lung cancer: NSCLC, +TTF1, +PDL1  Mediastinoscopy 02/04/2018: Metastatic lung cancer  Echocardiogram: None   Heart Catheterization: None     Assessment & Plan:     ICD-10-CM   1. Chronic obstructive pulmonary disease, unspecified COPD type ([place])  J44.9     2. Bronchiectasis without complication ([place])  T15.7     3. Chronic respiratory failure with hypoxia ([facility])  J96.11     4. Stage 3 severe COPD by GOLD classification ([place])  J44.9     5. History of lung cancer  Z85.118     6. Tobacco abuse  Z72.0     7. Former smoker  Z87.891       Discussion:  This is a 76 year old female, severe COPD PFTs on 2019, chronic hypoxemic respiratory failure 2 L nasal cannula baseline.  Currently managed with triple therapy inhaler regimen.  Plan: Continue triple therapy inhaler, can attempt to try to switch her to [facility] from Trelegy.  And help with financial aid assistance through [place] and me application. Continue albuterol as needed, albuterol refills as needed Continue O2 supplies and POC Goal SpO2 greater than 88% If she continues to have recurrent exacerbations could consider 250 mg azithromycin Monday Wednesday Friday in the future. Continue surveillance follow-up imaging with medical oncology and Dr. Gearldine Shown office.  RTC with [place] in 6 months or as needed.    Current Outpatient Medications:    acetaminophen (TYLENOL) 500 MG tablet, Take 2 tablets (1,000 mg total) by mouth every 6 (six) hours., Disp: 30 tablet, Rfl: 0   albuterol (PROVENTIL) (2.5 MG/3ML) 0.083% nebulizer solution, Take 3 mLs (2.5 mg total) by nebulization every 4 (four) hours as needed for wheezing or shortness of breath., Disp: 75 mL, Rfl: 11   Ascorbic Acid (VITAMIN C) 100 MG tablet, Take 100 mg by mouth 2 (two) times daily with a meal., Disp: , Rfl:    aspirin EC 81 MG tablet, Take 1 tablet (81 mg total) by mouth daily. Swallow whole., Disp: 30 tablet, Rfl: 0   benzonatate  (TESSALON) 200 MG capsule, Take 1 capsule (200 mg total) by mouth 3 (three) times daily as needed for cough., Disp: 90 capsule, Rfl: 0   Coenzyme Q10 (COQ-10 PO), Take 1 tablet by  mouth daily., Disp: , Rfl:    Fluticasone-Umeclidin-Vilant (TRELEGY ELLIPTA) 100-62.5-25 MCG/ACT AEPB, Inhale 1 puff into the lungs daily., Disp: 60 each, Rfl: 5   lidocaine-prilocaine (EMLA) cream, Apply to portacath 30 min to 1 hour prior to flush or infusions, Disp: 30 g, Rfl: 1   montelukast (SINGULAIR) 10 MG tablet, Take 1 tablet (10 mg total) by mouth at bedtime., Disp: 90 tablet, Rfl: 5   Multiple Vitamin (MULTIVITAMIN WITH MINERALS) TABS, Take 1 tablet by mouth daily., Disp: , Rfl:    oxyCODONE (OXY IR/ROXICODONE) 5 MG immediate release tablet, Take 1 tablet (5 mg total) by mouth every 4 (four) hours as needed for severe pain., Disp: 42 tablet, Rfl: 0   OXYGEN, Inhale 2 L into the lungs continuous. For COPD, Disp: , Rfl:    Respiratory Therapy Supplies (FLUTTER) DEVI, 1 Device by Does not apply route as needed., Disp: 1 each, Rfl: 0   VENTOLIN HFA 108 (90 Base) MCG/ACT inhaler, Inhale 1-2 puffs into the lungs every 4 (four) hours as needed for wheezing or shortness of breath., Disp: 18 g, Rfl: 11   Garner Nash, DO [facility] 10/30/2022 3:44 PM

## 2022-10-30 NOTE — Patient Instructions (Signed)
Thank you for visiting Dr. Valeta Harms at [facility]. Today we recommend the following:  Breztri samples today  [place] and [place] application  Albuterol refills   Return in about 6 months (around 05/01/2023) for with APP or Dr. Valeta Harms.    Please do your part to reduce the spread of COVID-19.

## 2022-11-02 ENCOUNTER — Telehealth: Payer: Self-pay | Admitting: Pulmonary Disease

## 2022-11-02 NOTE — Telephone Encounter (Signed)
Called and left voicemail for patient to call office back  

## 2022-11-07 DIAGNOSIS — S42412D Displaced simple supracondylar fracture without intercondylar fracture of left humerus, subsequent encounter for fracture with routine healing: Secondary | ICD-10-CM | POA: Diagnosis not present

## 2022-11-07 DIAGNOSIS — S42402D Unspecified fracture of lower end of left humerus, subsequent encounter for fracture with routine healing: Secondary | ICD-10-CM | POA: Diagnosis not present

## 2022-11-13 NOTE — Telephone Encounter (Signed)
Error

## 2022-11-14 ENCOUNTER — Ambulatory Visit: Payer: Medicare Other | Attending: Student | Admitting: Physical Therapy

## 2022-11-14 ENCOUNTER — Other Ambulatory Visit: Payer: Self-pay

## 2022-11-14 DIAGNOSIS — R600 Localized edema: Secondary | ICD-10-CM | POA: Insufficient documentation

## 2022-11-14 DIAGNOSIS — M25522 Pain in left elbow: Secondary | ICD-10-CM | POA: Insufficient documentation

## 2022-11-14 DIAGNOSIS — M25612 Stiffness of left shoulder, not elsewhere classified: Secondary | ICD-10-CM | POA: Insufficient documentation

## 2022-11-14 DIAGNOSIS — M25622 Stiffness of left elbow, not elsewhere classified: Secondary | ICD-10-CM | POA: Insufficient documentation

## 2022-11-14 NOTE — Therapy (Signed)
OUTPATIENT PHYSICAL THERAPY SHOULDER EVALUATION   Patient Name: Melissa Golden MRN: [id number] DOB:1946-11-18, 76 y.o., female Today's Date: 11/14/2022  END OF SESSION:  PT End of Session - 11/14/22 1632     Visit Number 1    Number of Visits 16    Date for PT Re-Evaluation 02/12/23    Authorization Type FOTO AT LEAST EVERY 5TH VISIT.  PROGRESS NOTE AT 10TH VISIT.  KX MODIFIER AFTER 15 VISITS.    PT Start Time 0228    PT Stop Time 0302    PT Time Calculation (min) 34 min    Activity Tolerance Patient tolerated treatment well    Behavior During Therapy [facility] for tasks assessed/performed             Past Medical History:  Diagnosis Date   Asthma    Cancer ([place]) 2017   colon   chemo tx   COPD (chronic obstructive pulmonary disease) ([place])    wears 3L home O2   Ileus following gastrointestinal surgery ([place]) 09/12/2017   required TPN support   Lung cancer ([place]) dx'd 10/2016   Pleural effusion, right    Shortness of breath    with exertion   Past Surgical History:  Procedure Laterality Date   ABDOMINAL HYSTERECTOMY     APPENDECTOMY     BREAST SURGERY     Left cyst removal   CATARACT EXTRACTION W/ INTRAOCULAR LENS IMPLANT     Right   CATARACT EXTRACTION W/PHACO  01/02/2013   Procedure: CATARACT EXTRACTION PHACO AND INTRAOCULAR LENS PLACEMENT ([place]);  Surgeon: Tonny Branch, MD;  Location: [facility];  Service: Ophthalmology;  Laterality: Left;  CDE: 16.19   COLON SURGERY     DILATION AND CURETTAGE OF UTERUS     EYE SURGERY     IR IMAGING GUIDED PORT INSERTION  05/31/2018   IR THORACENTESIS ASP PLEURAL SPACE W/IMG GUIDE  02/19/2018   LAPAROSCOPIC APPENDECTOMY  09/12/2017   laparoscopic hemicolectomy Right 12/07/2017   DR. Stitzenberg and [facility]   LUNG BIOPSY     LYMPH NODE BIOPSY N/A 02/04/2018   Procedure: LYMPH NODE BIOPSY;  Surgeon: Gaye Pollack, MD;  Location: [place] [facility];  Service: Thoracic;  Laterality: N/A;   MEDIASTINOSCOPY N/A 02/04/2018   Procedure:  MEDIASTINOSCOPY;  Surgeon: Gaye Pollack, MD;  Location: [place] [facility];  Service: Thoracic;  Laterality: N/A;   ORIF HUMERUS FRACTURE Left 10/11/2022   Procedure: OPEN REDUCTION INTERNAL FIXATION OF LEFT DISTAL HUMERUS FRACTURE;  Surgeon: Shona Needles, MD;  Location: [place];  Service: Orthopedics;  Laterality: Left;   PLEURAL EFFUSION DRAINAGE Right 03/01/2018   Procedure: DRAINAGE OF PLEURAL EFFUSION;  Surgeon: Gaye Pollack, MD;  Location: [place];  Service: Thoracic;  Laterality: Right;   VIDEO ASSISTED THORACOSCOPY Right 03/01/2018   Procedure: VIDEO ASSISTED THORACOSCOPY;  Surgeon: Gaye Pollack, MD;  Location: [facility] OR;  Service: Thoracic;  Laterality: Right;   Patient Active Problem List   Diagnosis Date Noted   Closed fracture of left distal humerus 10/11/2022   Bronchiectasis ([place]) 10/27/2021   Port-A-Cath in place 07/04/2018   S/P thoracotomy 03/01/2018   Chronic respiratory failure with hypoxia ([place]) 02/18/2018   Pleural effusion 02/18/2018   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  THERAPY DIAG:  Pain in left elbow  Stiffness of left elbow, not elsewhere classified  Stiffness of left shoulder, not elsewhere classified  Localized edema  Rationale for Evaluation and Treatment: Rehabilitation  ONSET DATE: 10/11/22  SUBJECTIVE:                                                                                                                                                                                      SUBJECTIVE STATEMENT: The patient presents to the clinic s/p ORIF of a left distal humeral fracture performed on 10/11/22.  She states she got tangled in her oxygen tubes, tripped and fell.  She was placed in a long arm  splint post surgically. She is out of the splint and has been actively moving her left UE for exercise.  She states she has gained motion in her shoulder by doing so.  Her pain-level today is a 4/10.  We discussed that we would begin with gentle range of motion.  She is aware that she is not supposed to weight bear through her left UE but does admit she has pushed up from chairs with the left UE.  PERTINENT HISTORY: COPD, portable 02  PAIN:  Are you having pain? Yes: NPRS scale: 4/10 Pain location: Left elbow. Pain description: Sore. Aggravating factors: Some movements Relieving factors: Sleep and medication.  PRECAUTIONS: Other: No left UE weight bearing.  Starting 11/22/22 may begin increasing weight bearing up to 5 pounds.    WEIGHT BEARING RESTRICTIONS: Yes No left UE weight bearing.  FALLS:  Has patient fallen in last 6 months? Yes. Number of falls 1.  LIVING ENVIRONMENT: Lives with: lives with their spouse Lives in: House/apartment Has following equipment at home: None  OCCUPATION: Retired.  PLOF: Independent  PATIENT GOALS:Get use of left arm back.  NEXT MD VISIT:   OBJECTIVE:    UPPER EXTREMITY ROM:  Left shoulder flexion is 135 degrees (right is 170 degrees), left shoulder ER is 90 degrees.  Left elbow extension is -32 degrees and flexion to 90 degrees.  Left forearm supination is 50 degrees, wrist extension is 45 degrees and flexion is 30 degrees.  She can make a functional grip on left.    OBSERVATION/EDEMA:  The patient's left elbow and forearm is still quite ecchymotic and she does have some swelling in her left hand.  She elevated her hand often throughout the day to decrease edema.  Circumferentially the patient's left elbow is 4 cms > right.  PALPATION:  Patient palpably tender in left posterior elbow region around incisional site which appears to be healing well.   TODAY'S TREATMENT:  DATE: Eval and HEP.   PATIENT EDUCATION: Education details: As below. Person educated: Patient Education method: Explanation, Demonstration, Tactile cues, Verbal cues, and Handouts Education comprehension: verbalized understanding and returned demonstration  HOME EXERCISE PROGRAM: [place] by [place] Cordae Mccarey Dec 19th, 2023 View at [email or web address] using code: [email or web address] Total 2 Page 1 of 1 ELBOW EXTENSION STRETCH - TOWEL ROLL Lying in recliner with pillow on armrest. Relax your arm and allow gravity to stretch the elbow into a more straightened position. Repeat 1 Time Hold 5 Minutes Complete 1 Set Perform 4 Times a Day WRIST SUPINATION Turn your forearm towards palm face up. Keep your elbow bent and by the side of your body. Repeat 10 Times Hold 10 Seconds Complete 2 Sets Perform 4 Times a Day  ASSESSMENT:  CLINICAL IMPRESSION: The patient presents to OPPT s/o ORIF of left distal humerus performed on 10/11/22.  She is out of her long arm splint now and has been actively moving her left shoulder, elbow and forearm.  She has limitation of range of motion as expected especially into left elbow flexion and extension.  She has remaining ecchymosis and moderate swelling at this time.  She is NWB through her left UE at this time until 11/22/22 at which time she may begin weight bearing up to 5 pounds.  Patient will benefit from skilled physical therapy intervention to address pain and deficits. OBJECTIVE IMPAIRMENTS: decreased activity tolerance, decreased ROM, increased edema, and pain.   ACTIVITY LIMITATIONS: carrying, lifting, dressing, reach over head, and caring for others  PARTICIPATION LIMITATIONS: meal prep, cleaning, laundry, and driving  REHAB POTENTIAL: Good  CLINICAL DECISION MAKING: Stable/uncomplicated  EVALUATION COMPLEXITY: Low   GOALS:  SHORT TERM GOALS: Target date:  11/28/22  Ind with initial HEP. Goal status: INITIAL  2.  Active left elbow extension improved by 10 degrees. Goal status: INITIAL  3.  Active left elbow flexion improved by 10 degrees. Goal status: INITIAL   LONG TERM GOALS: Target date: 02/12/23  Ind with advanced HEP. Goal status: INITIAL  2.  Active left shoulder flexion to 150 degrees. Goal status: INITIAL  3.  Active left elbow flexion to 115-120 degrees.  Goal status: INITIAL  4.  Active left elbow extension to -10 degrees. Goal status: INITIAL  5.  Perform ADL's with pain not > 3/10. Goal status: INITIAL   PLAN:  PT FREQUENCY: 2x/week  PT DURATION: 8 weeks  PLANNED INTERVENTIONS: Therapeutic exercises, Therapeutic activity, Patient/Family education, Self Care, Electrical stimulation, Cryotherapy, Moist heat, and Manual therapy  PLAN FOR NEXT SESSION: Gentle ROM to left shoulder, elbow and forearm.  STW/M to left biceps may be beneficial.   Chonte Ricke, [place], PT 11/14/2022, 4:36 PM

## 2022-11-15 NOTE — Telephone Encounter (Signed)
Called and spoke with patient. She had questions about the [employer] [facility] [facility] application. I went over the application with the patient and she verbalized understanding. She will bring the application by the office tomorrow.   Will close this encounter.

## 2022-11-16 ENCOUNTER — Ambulatory Visit (HOSPITAL_BASED_OUTPATIENT_CLINIC_OR_DEPARTMENT_OTHER)
Admission: RE | Admit: 2022-11-16 | Discharge: 2022-11-16 | Disposition: A | Discharge status: Home / Self Care | Payer: Medicare Other | Source: Ambulatory Visit | Attending: Nurse Practitioner | Admitting: Nurse Practitioner

## 2022-11-16 ENCOUNTER — Encounter (HOSPITAL_BASED_OUTPATIENT_CLINIC_OR_DEPARTMENT_OTHER): Payer: Self-pay

## 2022-11-16 ENCOUNTER — Telehealth: Payer: Self-pay | Admitting: Pulmonary Disease

## 2022-11-16 ENCOUNTER — Inpatient Hospital Stay: Payer: Medicare Other | Attending: Oncology | Admitting: Oncology

## 2022-11-16 ENCOUNTER — Inpatient Hospital Stay: Payer: Medicare Other

## 2022-11-16 VITALS — BP 120/64 | HR 89 | Temp 98.1°F | Resp 20 | Ht 62.0 in | Wt 125.0 lb

## 2022-11-16 VITALS — BP 112/67 | HR 86 | Temp 98.8°F | Resp 18 | Ht 62.0 in | Wt 126.2 lb

## 2022-11-16 DIAGNOSIS — Z452 Encounter for adjustment and management of vascular access device: Secondary | ICD-10-CM | POA: Insufficient documentation

## 2022-11-16 DIAGNOSIS — C3411 Malignant neoplasm of upper lobe, right bronchus or lung: Secondary | ICD-10-CM | POA: Insufficient documentation

## 2022-11-16 DIAGNOSIS — Z95828 Presence of other vascular implants and grafts: Secondary | ICD-10-CM

## 2022-11-16 DIAGNOSIS — C3490 Malignant neoplasm of unspecified part of unspecified bronchus or lung: Secondary | ICD-10-CM | POA: Diagnosis not present

## 2022-11-16 DIAGNOSIS — Z85118 Personal history of other malignant neoplasm of bronchus and lung: Secondary | ICD-10-CM | POA: Insufficient documentation

## 2022-11-16 DIAGNOSIS — J90 Pleural effusion, not elsewhere classified: Secondary | ICD-10-CM | POA: Diagnosis not present

## 2022-11-16 DIAGNOSIS — Z8509 Personal history of malignant neoplasm of other digestive organs: Secondary | ICD-10-CM | POA: Insufficient documentation

## 2022-11-16 DIAGNOSIS — J432 Centrilobular emphysema: Secondary | ICD-10-CM | POA: Diagnosis not present

## 2022-11-16 MED ORDER — HEPARIN SOD (PORK) LOCK FLUSH 100 UNIT/ML IV SOLN
500.0000 [IU] | Freq: Once | INTRAVENOUS | Status: AC
  Administered 2022-11-16: 500 [IU]

## 2022-11-16 MED ORDER — SODIUM CHLORIDE 0.9% FLUSH
10.0000 mL | Freq: Once | INTRAVENOUS | Status: AC
  Administered 2022-11-16: 10 mL

## 2022-11-16 NOTE — Patient Instructions (Signed)

## 2022-11-16 NOTE — Telephone Encounter (Signed)
Noted by clinical staff. Will forwards to [place], Dr Fabio Bering medical assistant as an Juluis Rainier for her.   Patty Sermons just an [place]

## 2022-11-16 NOTE — Progress Notes (Signed)
[place] OFFICE PROGRESS NOTE   Diagnosis: Non-small cell lung cancer  INTERVAL HISTORY:   Melissa Golden turns as scheduled.  She reports stable dyspnea.  She fell and broke Melissa left arm last month.  She underwent open reduction and internal fixation of a left distal humerus fracture on 10/11/2022.  She has started physical therapy.  She continues to have pain in the left arm.  Objective:  Vital signs in last 24 hours:  Blood pressure 120/64, pulse 89, temperature 98.1 F (36.7 C), resp. rate 20, height _0  (1.575 m), weight 125 lb (56.7 kg), SpO2 100 %.    HEENT: Neck without mass Lymphatics: No cervical, supraclavicular, or axillary nodes Resp: Distant breath sounds, scattered rhonchi and wheeze, no respiratory distress Cardio: Regular rate and rhythm GI: No hepatosplenomegaly Vascular: No leg edema   Portacath/PICC-without erythema  Lab Results:  Lab Results  Component Value Date   WBC 4.7 10/12/2022   HGB 7.5 (L) 10/12/2022   HCT 23.2 (L) 10/12/2022   MCV 97.1 10/12/2022   PLT 165 10/12/2022   NEUTROABS 3.5 07/19/2021    CMP  Lab Results  Component Value Date   NA 142 10/12/2022   K 3.5 10/12/2022   CL 99 10/12/2022   CO2 32 10/12/2022   GLUCOSE 93 10/12/2022   BUN 12 10/12/2022   CREATININE 0.64 10/12/2022   CALCIUM 8.3 (L) 10/12/2022   PROT 6.9 07/19/2021   ALBUMIN 3.8 07/19/2021   AST 16 07/19/2021   ALT 10 07/19/2021   ALKPHOS 63 07/19/2021   BILITOT 0.5 07/19/2021   GFRNONAA >60 10/12/2022   GFRAA >60 06/17/2020    Lab Results  Component Value Date   CEA1 2.44 04/19/2020    Lab Results  Component Value Date   INR 0.89 05/31/2018   LABPROT 12.0 05/31/2018    Imaging:  No results found.  Medications: I have reviewed the patient's current medications.   Assessment/Plan: Adenocarcinoma/goblet cell carcinoid of the appendix CT scan of the abdomen/pelvis on 09/12/2017 - fluid-filled hypervascular appendix with mild  reactive enteritis within the ileum.   Status post laparoscopic appendectomy 09/12/2017.  Postoperative course complicated by an ileus requiring TPN.   Final pathology of the appendix-5 cm adenocarcinoma/goblet cell carcinoid.  The tumor extended through the muscularis propria into subserosal soft tissue and mesoappendix and focally to the serosal surface.  Lymphovascular space invasion identified.  Perineural invasion identified.  Associated acute appendicitis with perforation. 10/15/2017 CEA 1. 11/22/2017 chromogranin A-3 Status post laparoscopic right hemicolectomy by Dr. Cecil Cobbs [facility] on 12/07/2017.  No carcinomatosis or liver metastases were seen.   Final pathology showed metastatic adenocarcinoma/goblet cell carcinoid in 3 of 15 pericolic lymph nodes; maximum size of largest metastasis 0.2 cm; positive for extracapsular extension.  Benign terminal ileum with fibrous serosal adhesions, negative for dysplasia or carcinoma; surgically absent appendix; appendectomy site with postsurgical changes and fibrous serosal adhesions, negative for carcinoma; proximal and distal mucosal margins negative for dysplasia or carcinoma; mesenteric resection margin negative for carcinoma. Cycle 1 adjuvant Xeloda 01/07/2018 Cycle 2 adjuvant Xeloda 01/28/2018 Non-small cell lung cancer PET scan 50/35/4656-8 hypermetabolic right upper lobe pulmonary nodules increased in size and metabolic activity.  No mediastinal nodal metastasis.  4 mm right upper lobe nodule with metabolic activity, SUV 3.6.  A third right upper lobe nodule measures 4 mm and has faint metabolic activity.  No clear evidence of malignancy in the abdomen or pelvis.  Mild activity along the ventral abdominal surgical scar.  Scattered activity in small bowel without focal lesion on CT. CT biopsy right upper lobe nodule 11/02/2017 showed non-small cell carcinoma; malignant cells positive for TTF-1 and negative for p63.  PD-L1 95%. Chest CT 01/21/2018-new  mediastinal adenopathy seen in the prevascular and right paratracheal stations.  Largest lymph node is seen in the prevascular space measuring 2.0 cm, previously 6 mm.  No hilar or axillary adenopathy.  Right lung nodules are stable. Biopsy of a right paratracheal lymph node on 02/04/2018-metastatic adenocarcinoma consistent with a lung primary Chest radiation started 03/11/2018 Week 1 Taxol/carboplatin 03/13/2018 Week 2 Taxol/carboplatin 03/20/2018 Week 3 Taxol/carboplatin 03/27/2018 Week  4Taxol/carboplatin 04/03/2018 Week 5 Taxol/carboplatin 04/10/2018 Week 6 Taxol/carboplatin 04/19/2018 Chest radiation completed 04/25/2018 CT chest 05/23/2018-interval decrease in size of mediastinal adenopathy.  Near complete resolution of previously visualized right pleural effusion.  The right lung nodules have decreased in size.  Other nodules are stable. Cycle 1 durvalumab 05/23/2018 Cycle 2 durvalumab 06/06/2018 Cycle 3 durvalumab 06/20/2018 Cycle 4 durvalumab 07/04/2018 Cycle 5 durvalumab 07/19/2018 Cycle 6 durvalumab 08/01/2018 Cycle 7 durvalumab 08/15/2018 CT chest 09/03/2018- collapse/consolidation with bronchiectasis and surrounding groundglass involving primarily the right upper lobe, findings most indicative of recent radiation therapy. Previously measured right upper lobe nodule obscured.  New areas of nodularity in the right lower lobe, likely infectious or inflammatory. Cycle 8 durvalumab 09/04/2018 Cycle 9 durvalumab 09/18/2018 Cycle 10 durvalumab 10/02/2018 Cycle 11 durvalumab 10/16/2018  Cycle 12 durvalumab 11/13/2018 Cycle 13 durvalumab 11/30/2018 Cycle 14 durvalumab 12/11/2018 CT chest 12/16/2018 [facility])- no mediastinal, axillary or hilar adenopathy.  Centrilobular emphysema.  Right apical volume loss with bronchiectasis traversing the area of atelectatic lung.  Elevation of the adjacent minor fissure due to volume loss.  Subpleural left upper lobe opacity, nonspecific, measuring 8.7 mm.  Compression  fractures T5 and T7. Cycle 15 durvalumab 12/25/2018 Cycle 16 durvalumab 01/29/2019 Cycle 17 durvalumab 02/12/2019 Cycle 18 durvalumab 02/26/2019 Cycle 19 durvalumab 03/12/2019 Cycle 20 durvalumab 03/26/2019 Cycle 21 durvalumab 04/16/2019 Cycle 22 durvalumab 04/30/2019 CT chest 05/12/2019- right lung radiation fibrosis, no evidence of local tumor recurrence, no evidence of metastatic disease in the chest, stable pulmonary nodules Cycle 23 durvalumab 05/14/2019 CT chest 10/13/2019-stable radiation changes in the right lung, new nodules in the right lower lobe, left lower lobe, and lingula-favored to represent an inflammatory process CT chest 12/30/2019-no findings to suggest recurrent or metastatic disease, subtle groundglass at the site of previous nodularity in the right lung base almost completely resolved CT chest 06/17/2020-6 mm left lower lobe nodule-more conspicuous, stable compression deformities at T5 and T7 CT chest 10/04/2020-nodular density medial left lower lobe unchanged.  Adjacent peribronchovascular groundglass and volume loss have increased slightly and are likely infectious or inflammatory. CT chest 04/28/2021-new interstitial and nodular opacities in the right upper lobe laterally, favoring pneumonia, tumor and lymphangitic spread considered less likely.  Mild increase in loculated right pleural effusion.  Mild increase in atelectasis or nodular scarring peripherally at the left lung base.  Stable compression fractures at T5 and T7. CT chest 07/19/2021-previously described patchy nodular foci of consolidation in the upper right lung and left lung base have resolved compatible with resolved multilobar pneumonia.  Stable radiation fibrosis upper right lung with no evidence of local tumor recurrence.  No findings suspicious for metastatic disease in the chest. CT chest 02/07/2022-interval development of bilateral irregular pulmonary nodules right greater than left. CT chest 06/01/2022-unchanged  posttreatment appearance of the right chest, irregular nodules and groundglass opacities in the bilateral lung bases have improved, emphysema,  enlargement the distal descending and proximal abdominal aorta     COPD   4.   VATS drainage of a loculated right pleural effusion 03/01/2018   5.  Port-A-Cath placement 05/31/2018   6.  Severe mid back pain 10/30/2018-CT thoracic spine- T5 compression fracture new since 09/03/2018- appearance consistent with an osteoporotic fracture.  Pain improved.  Increased back pain January 2020.  CT scan 12/16/2018- new compression fractures T5 and T7.  T5 vertebral body somewhat sclerotic in appearance.  MRI thoracic spine 01/08/2019-acute compression fractures involving the T5 and T7 vertebral bodies stable relative to recent CT with up to 50% height loss at T5 and 25% height loss at T7.  No associated bony retropulsion.  Benign/mechanical features by MRI with no appreciable underlying pathologic lesion.  No other evidence for metastatic disease within the thoracic spine.      Disposition: Melissa Golden is in clinical remission from lung cancer.  She is recovering from recent left arm fracture.  She underwent a restaging chest CT today.  I reviewed the images with Melissa Golden and Melissa Golden.  There is no apparent evidence of disease progression.  We will wait on the final report.  She will return for an office visit and restaging chest CT in 6 months. She would like to keep the Port-A-Cath in place.  We discussed the risk of infection and bleeding.  She will return for a Port-A-Cath flush every 6-8 weeks.  Betsy Coder, MD  11/16/2022  12:26 PM

## 2022-11-21 ENCOUNTER — Telehealth: Payer: Self-pay

## 2022-11-21 NOTE — Telephone Encounter (Signed)
PT is schedule on 02/08/23 for her Ct

## 2022-11-21 NOTE — Telephone Encounter (Signed)
[employer] app. Done and faxed.

## 2022-11-21 NOTE — Telephone Encounter (Signed)
-----   Message from Ladell Pier, MD sent at 11/21/2022  4:10 PM EST ----- Please call patient final report of chest CT reveals stable radiation changes, small new inflammatory nodules at the posterior right lower lung-likely benign, radiologist recommends a follow-up noncontrast chest CT in 3 months, please schedule if she is in agreement

## 2022-11-23 ENCOUNTER — Ambulatory Visit: Payer: Medicare Other | Admitting: *Deleted

## 2022-11-24 DIAGNOSIS — Z23 Encounter for immunization: Secondary | ICD-10-CM | POA: Diagnosis not present

## 2022-11-28 NOTE — Telephone Encounter (Signed)
Faxed ins. Info to [employer].

## 2022-11-30 ENCOUNTER — Ambulatory Visit: Payer: Medicare Other | Attending: Student | Admitting: *Deleted

## 2022-11-30 ENCOUNTER — Encounter: Payer: Self-pay | Admitting: *Deleted

## 2022-11-30 DIAGNOSIS — M25612 Stiffness of left shoulder, not elsewhere classified: Secondary | ICD-10-CM | POA: Diagnosis not present

## 2022-11-30 DIAGNOSIS — M25522 Pain in left elbow: Secondary | ICD-10-CM | POA: Insufficient documentation

## 2022-11-30 DIAGNOSIS — M25622 Stiffness of left elbow, not elsewhere classified: Secondary | ICD-10-CM | POA: Insufficient documentation

## 2022-11-30 NOTE — Therapy (Signed)
OUTPATIENT PHYSICAL THERAPY SHOULDER EVALUATION   Patient Name: Melissa Golden MRN: [id number] DOB:Apr 03, 1946, 77 y.o., female Today's Date: 11/30/2022  END OF SESSION:  PT End of Session - 11/30/22 1435     Visit Number 2    Number of Visits 16    Date for PT Re-Evaluation 02/12/23    Authorization Type FOTO AT LEAST EVERY 5TH VISIT.  PROGRESS NOTE AT 10TH VISIT.  KX MODIFIER AFTER 15 VISITS.    PT Start Time 1430    PT Stop Time 1520    PT Time Calculation (min) 50 min             Past Medical History:  Diagnosis Date   Asthma    Cancer ([place]) 2017   colon   chemo tx   COPD (chronic obstructive pulmonary disease) ([place])    wears 3L home O2   Ileus following gastrointestinal surgery ([place]) 09/12/2017   required TPN support   Lung cancer ([place]) dx'd 10/2016   Pleural effusion, right    Shortness of breath    with exertion   Past Surgical History:  Procedure Laterality Date   ABDOMINAL HYSTERECTOMY     APPENDECTOMY     BREAST SURGERY     Left cyst removal   CATARACT EXTRACTION W/ INTRAOCULAR LENS IMPLANT     Right   CATARACT EXTRACTION W/PHACO  01/02/2013   Procedure: CATARACT EXTRACTION PHACO AND INTRAOCULAR LENS PLACEMENT ([place]);  Surgeon: Tonny Branch, MD;  Location: [facility];  Service: Ophthalmology;  Laterality: Left;  CDE: 16.19   COLON SURGERY     DILATION AND CURETTAGE OF UTERUS     EYE SURGERY     IR IMAGING GUIDED PORT INSERTION  05/31/2018   IR THORACENTESIS ASP PLEURAL SPACE W/IMG GUIDE  02/19/2018   LAPAROSCOPIC APPENDECTOMY  09/12/2017   laparoscopic hemicolectomy Right 12/07/2017   DR. Stitzenberg and [facility]   LUNG BIOPSY     LYMPH NODE BIOPSY N/A 02/04/2018   Procedure: LYMPH NODE BIOPSY;  Surgeon: Gaye Pollack, MD;  Location: [facility];  Service: Thoracic;  Laterality: N/A;   MEDIASTINOSCOPY N/A 02/04/2018   Procedure: MEDIASTINOSCOPY;  Surgeon: Gaye Pollack, MD;  Location: [place] [facility];  Service: Thoracic;  Laterality: N/A;   ORIF HUMERUS  FRACTURE Left 10/11/2022   Procedure: OPEN REDUCTION INTERNAL FIXATION OF LEFT DISTAL HUMERUS FRACTURE;  Surgeon: Shona Needles, MD;  Location: [facility];  Service: Orthopedics;  Laterality: Left;   PLEURAL EFFUSION DRAINAGE Right 03/01/2018   Procedure: DRAINAGE OF PLEURAL EFFUSION;  Surgeon: Gaye Pollack, MD;  Location: [facility];  Service: Thoracic;  Laterality: Right;   VIDEO ASSISTED THORACOSCOPY Right 03/01/2018   Procedure: VIDEO ASSISTED THORACOSCOPY;  Surgeon: Gaye Pollack, MD;  Location: [facility] OR;  Service: Thoracic;  Laterality: Right;   Patient Active Problem List   Diagnosis Date Noted   Closed fracture of left distal humerus 10/11/2022   Bronchiectasis ([place]) 10/27/2021   Port-A-Cath in place 07/04/2018   S/P thoracotomy 03/01/2018   Chronic respiratory failure with hypoxia ([place]) 02/18/2018   Pleural effusion 02/18/2018   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  THERAPY DIAG:  Pain in left elbow  Stiffness of left elbow, not elsewhere classified  Stiffness of left  shoulder, not elsewhere classified  Rationale for Evaluation and Treatment: Rehabilitation  ONSET DATE: 10/11/22  SUBJECTIVE:                                                                                                                                                                                      SUBJECTIVE STATEMENT: LT shldr 3/10, Elbow 7/10, Hand 3/10. Having increase pain in the palm of my hand and now can't make a fist. I need it to be checked out by MD. PERTINENT HISTORY: COPD, portable 02  PAIN:  Are you having pain? Yes: NPRS scale: 7/10 Pain location: Left elbow. Pain description: Sore. Aggravating factors: Some movements Relieving  factors: Sleep and medication.  PRECAUTIONS: Other: No left UE weight bearing.  Starting 11/22/22 may begin increasing weight bearing up to 5 pounds.    WEIGHT BEARING RESTRICTIONS: Yes No left UE weight bearing.  FALLS:  Has patient fallen in last 6 months? Yes. Number of falls 1.  LIVING ENVIRONMENT: Lives with: lives with their spouse Lives in: House/apartment Has following equipment at home: None  OCCUPATION: Retired.  PLOF: Independent  PATIENT GOALS:Get use of left arm back.  NEXT MD VISIT:   OBJECTIVE:    UPPER EXTREMITY ROM:  Left shoulder flexion is 135 degrees (right is 170 degrees), left shoulder ER is 90 degrees.  Left elbow extension is -32 degrees and flexion to 90 degrees.  Left forearm supination is 50 degrees, wrist extension is 45 degrees and flexion is 30 degrees.  She can make a functional grip on left.    OBSERVATION/EDEMA:  The patient's left elbow and forearm is still quite ecchymotic and she does have some swelling in her left hand.  She elevated her hand often throughout the day to decrease edema.  Circumferentially the patient's left elbow is 4 cms > right.  PALPATION:  Patient palpably tender in left posterior elbow region around incisional site which appears to be healing well.   TODAY'S TREATMENT:  DATE:                                             11-30-22                                     EXERCISE LOG  Exercise Repetitions and Resistance Comments  UE ranger seated  X 5 mins                     Blank cell = exercise not performed today  Manual: AAROM for LT shldr elevation and  ER, elbow Flexion and extension, and forearm pronation and supination. STW/TPR to LT shldr blade musculature , biceps , as well as scar massage at elbow. Modalities: IFC x 15 mins to LT shldr in seated position     PATIENT  EDUCATION: Education details: As below. Person educated: Patient Education method: Explanation, Demonstration, Tactile cues, Verbal cues, and Handouts Education comprehension: verbalized understanding and returned demonstration  HOME EXERCISE PROGRAM: [place] by Mali Applegate Dec 19th, 2023 View at [email or web address] using code: [email or web address] Total 2 Page 1 of 1 ELBOW EXTENSION STRETCH - TOWEL ROLL Lying in recliner with pillow on armrest. Relax your arm and allow gravity to stretch the elbow into a more straightened position. Repeat 1 Time Hold 5 Minutes Complete 1 Set Perform 4 Times a Day WRIST SUPINATION Turn your forearm towards palm face up. Keep your elbow bent and by the side of your body. Repeat 10 Times Hold 10 Seconds Complete 2 Sets Perform 4 Times a Day  ASSESSMENT:  CLINICAL IMPRESSION: The patient presents to [facility] s/o ORIF of left distal humerus performed on 10/11/22. She reports performing HEP as tolerated, but also reports a new pain in the palm of her LT hand and is now unable to make a fist. Pt assessed by MPT and may be referred to OT/hand specialist. Pt did well with RX, but had notable Tp's throughout Shldr blade musculature. IFC to shldr end of session with good response. Elbow ROM 30-100 degrees today.        OBJECTIVE IMPAIRMENTS: decreased activity tolerance, decreased ROM, increased edema, and pain.   ACTIVITY LIMITATIONS: carrying, lifting, dressing, reach over head, and caring for others  PARTICIPATION LIMITATIONS: meal prep, cleaning, laundry, and driving  REHAB POTENTIAL: Good  CLINICAL DECISION MAKING: Stable/uncomplicated  EVALUATION COMPLEXITY: Low   GOALS:  SHORT TERM GOALS: Target date: 11/28/22  Ind with initial HEP. Goal status: INITIAL  2.  Active left elbow extension improved by 10 degrees. Goal status: INITIAL  3.  Active left elbow flexion improved by 10 degrees. Goal status: INITIAL   LONG TERM  GOALS: Target date: 02/12/23  Ind with advanced HEP. Goal status: INITIAL  2.  Active left shoulder flexion to 150 degrees. Goal status: INITIAL  3.  Active left elbow flexion to 115-120 degrees.  Goal status: INITIAL  4.  Active left elbow extension to -10 degrees. Goal status: INITIAL  5.  Perform ADL's with pain not > 3/10. Goal status: INITIAL   PLAN:  PT FREQUENCY: 2x/week  PT DURATION: 8 weeks  PLANNED INTERVENTIONS: Therapeutic exercises, Therapeutic activity, Patient/Family education, Self Care, Electrical stimulation, Cryotherapy, Moist heat, and Manual therapy  PLAN FOR NEXT SESSION: Gentle ROM to left shoulder,  elbow and forearm.  STW/M to left biceps may be beneficial.   Lyza Houseworth,CHRIS, PTA 11/30/2022, 4:48 PM

## 2022-12-05 DIAGNOSIS — S42402D Unspecified fracture of lower end of left humerus, subsequent encounter for fracture with routine healing: Secondary | ICD-10-CM | POA: Diagnosis not present

## 2022-12-05 DIAGNOSIS — S42412D Displaced simple supracondylar fracture without intercondylar fracture of left humerus, subsequent encounter for fracture with routine healing: Secondary | ICD-10-CM | POA: Diagnosis not present

## 2022-12-18 ENCOUNTER — Telehealth: Payer: Self-pay | Admitting: Pulmonary Disease

## 2022-12-18 ENCOUNTER — Other Ambulatory Visit: Payer: Self-pay

## 2022-12-18 MED ORDER — MONTELUKAST SODIUM 10 MG PO TABS: 10.0000 mg | ORAL_TABLET | Freq: Every day | ORAL | 5 refills | Status: DC

## 2022-12-18 NOTE — Telephone Encounter (Signed)
Spoke with patient advised refill of Singular has been sent to Pharmacy. Nothing further needed.

## 2022-12-20 DIAGNOSIS — M79642 Pain in left hand: Secondary | ICD-10-CM | POA: Diagnosis not present

## 2022-12-20 DIAGNOSIS — M25642 Stiffness of left hand, not elsewhere classified: Secondary | ICD-10-CM | POA: Diagnosis not present

## 2022-12-20 DIAGNOSIS — M65332 Trigger finger, left middle finger: Secondary | ICD-10-CM | POA: Diagnosis not present

## 2022-12-28 ENCOUNTER — Inpatient Hospital Stay: Payer: Medicare Other | Attending: Oncology

## 2022-12-28 VITALS — BP 136/68 | HR 96 | Resp 20 | Ht 62.0 in | Wt 121.6 lb

## 2022-12-28 DIAGNOSIS — Z452 Encounter for adjustment and management of vascular access device: Secondary | ICD-10-CM | POA: Insufficient documentation

## 2022-12-28 DIAGNOSIS — Z8509 Personal history of malignant neoplasm of other digestive organs: Secondary | ICD-10-CM | POA: Insufficient documentation

## 2022-12-28 DIAGNOSIS — Z95828 Presence of other vascular implants and grafts: Secondary | ICD-10-CM

## 2022-12-28 DIAGNOSIS — Z85118 Personal history of other malignant neoplasm of bronchus and lung: Secondary | ICD-10-CM | POA: Diagnosis not present

## 2022-12-28 DIAGNOSIS — C3411 Malignant neoplasm of upper lobe, right bronchus or lung: Secondary | ICD-10-CM

## 2022-12-28 MED ORDER — SODIUM CHLORIDE 0.9% FLUSH
10.0000 mL | Freq: Once | INTRAVENOUS | Status: AC
  Administered 2022-12-28: 10 mL

## 2022-12-28 MED ORDER — HEPARIN SOD (PORK) LOCK FLUSH 100 UNIT/ML IV SOLN
500.0000 [IU] | Freq: Once | INTRAVENOUS | Status: AC
  Administered 2022-12-28: 500 [IU]

## 2022-12-28 NOTE — Patient Instructions (Signed)

## 2023-01-03 DIAGNOSIS — M65332 Trigger finger, left middle finger: Secondary | ICD-10-CM | POA: Diagnosis not present

## 2023-01-03 DIAGNOSIS — M79645 Pain in left finger(s): Secondary | ICD-10-CM | POA: Diagnosis not present

## 2023-01-03 DIAGNOSIS — M25642 Stiffness of left hand, not elsewhere classified: Secondary | ICD-10-CM | POA: Diagnosis not present

## 2023-01-12 DIAGNOSIS — M79645 Pain in left finger(s): Secondary | ICD-10-CM | POA: Diagnosis not present

## 2023-01-12 DIAGNOSIS — M25642 Stiffness of left hand, not elsewhere classified: Secondary | ICD-10-CM | POA: Diagnosis not present

## 2023-01-12 DIAGNOSIS — M65332 Trigger finger, left middle finger: Secondary | ICD-10-CM | POA: Diagnosis not present

## 2023-01-23 DIAGNOSIS — S42412D Displaced simple supracondylar fracture without intercondylar fracture of left humerus, subsequent encounter for fracture with routine healing: Secondary | ICD-10-CM | POA: Diagnosis not present

## 2023-01-25 DIAGNOSIS — J441 Chronic obstructive pulmonary disease with (acute) exacerbation: Secondary | ICD-10-CM | POA: Diagnosis not present

## 2023-01-25 DIAGNOSIS — E782 Mixed hyperlipidemia: Secondary | ICD-10-CM | POA: Diagnosis not present

## 2023-01-25 DIAGNOSIS — M25642 Stiffness of left hand, not elsewhere classified: Secondary | ICD-10-CM | POA: Diagnosis not present

## 2023-01-25 DIAGNOSIS — M79645 Pain in left finger(s): Secondary | ICD-10-CM | POA: Diagnosis not present

## 2023-01-25 DIAGNOSIS — M65332 Trigger finger, left middle finger: Secondary | ICD-10-CM | POA: Diagnosis not present

## 2023-01-31 DIAGNOSIS — M25642 Stiffness of left hand, not elsewhere classified: Secondary | ICD-10-CM | POA: Diagnosis not present

## 2023-02-02 DIAGNOSIS — M65332 Trigger finger, left middle finger: Secondary | ICD-10-CM | POA: Diagnosis not present

## 2023-02-02 DIAGNOSIS — M25642 Stiffness of left hand, not elsewhere classified: Secondary | ICD-10-CM | POA: Diagnosis not present

## 2023-02-08 ENCOUNTER — Ambulatory Visit (HOSPITAL_BASED_OUTPATIENT_CLINIC_OR_DEPARTMENT_OTHER)
Admission: RE | Admit: 2023-02-08 | Discharge: 2023-02-08 | Disposition: A | Discharge status: Home / Self Care | Payer: Medicare Other | Source: Ambulatory Visit | Attending: Oncology | Admitting: Oncology

## 2023-02-08 ENCOUNTER — Inpatient Hospital Stay: Payer: Medicare Other | Attending: Oncology

## 2023-02-08 DIAGNOSIS — J181 Lobar pneumonia, unspecified organism: Secondary | ICD-10-CM | POA: Diagnosis not present

## 2023-02-08 DIAGNOSIS — I2510 Atherosclerotic heart disease of native coronary artery without angina pectoris: Secondary | ICD-10-CM | POA: Diagnosis not present

## 2023-02-08 DIAGNOSIS — Z85118 Personal history of other malignant neoplasm of bronchus and lung: Secondary | ICD-10-CM | POA: Diagnosis not present

## 2023-02-08 DIAGNOSIS — Z452 Encounter for adjustment and management of vascular access device: Secondary | ICD-10-CM | POA: Insufficient documentation

## 2023-02-08 DIAGNOSIS — Z8509 Personal history of malignant neoplasm of other digestive organs: Secondary | ICD-10-CM | POA: Diagnosis not present

## 2023-02-08 DIAGNOSIS — J479 Bronchiectasis, uncomplicated: Secondary | ICD-10-CM | POA: Diagnosis not present

## 2023-02-08 DIAGNOSIS — Z95828 Presence of other vascular implants and grafts: Secondary | ICD-10-CM

## 2023-02-08 DIAGNOSIS — Z923 Personal history of irradiation: Secondary | ICD-10-CM | POA: Insufficient documentation

## 2023-02-08 DIAGNOSIS — C3411 Malignant neoplasm of upper lobe, right bronchus or lung: Secondary | ICD-10-CM | POA: Insufficient documentation

## 2023-02-08 DIAGNOSIS — J439 Emphysema, unspecified: Secondary | ICD-10-CM | POA: Diagnosis not present

## 2023-02-08 MED ORDER — HEPARIN SOD (PORK) LOCK FLUSH 100 UNIT/ML IV SOLN
500.0000 [IU] | Freq: Once | INTRAVENOUS | Status: AC
  Administered 2023-02-08: 500 [IU]

## 2023-02-08 MED ORDER — SODIUM CHLORIDE 0.9% FLUSH
10.0000 mL | Freq: Once | INTRAVENOUS | Status: AC
  Administered 2023-02-08: 10 mL

## 2023-02-15 DIAGNOSIS — M65332 Trigger finger, left middle finger: Secondary | ICD-10-CM | POA: Diagnosis not present

## 2023-02-15 DIAGNOSIS — R29898 Other symptoms and signs involving the musculoskeletal system: Secondary | ICD-10-CM | POA: Diagnosis not present

## 2023-02-15 DIAGNOSIS — M25642 Stiffness of left hand, not elsewhere classified: Secondary | ICD-10-CM | POA: Diagnosis not present

## 2023-02-16 ENCOUNTER — Telehealth: Payer: Self-pay | Admitting: *Deleted

## 2023-02-16 NOTE — Telephone Encounter (Signed)
Made Melissa Golden aware that CT shows not cancer, however inflammatory changes may reflect pneumonia. She needs to call Dr. Valeta Harms for fever or increased shortness of breath. She understands and agrees. Currently is doing well. Routed CT report to Dr. Valeta Harms.

## 2023-02-16 NOTE — Telephone Encounter (Signed)
-----   Message from Ladell Pier, MD sent at 02/15/2023  8:32 PM EDT ----- Please call patient CT chest shows no evidence of cancer, there are inflammatory changes in the lungs, possibly reflecting pneumonia She should call for increased shortness of breath or fever, she should follow-up with her pulmonary physician and primary provider to review the CT  Follow-up as scheduled

## 2023-02-22 DIAGNOSIS — R29898 Other symptoms and signs involving the musculoskeletal system: Secondary | ICD-10-CM | POA: Diagnosis not present

## 2023-02-22 DIAGNOSIS — M65332 Trigger finger, left middle finger: Secondary | ICD-10-CM | POA: Diagnosis not present

## 2023-02-22 DIAGNOSIS — M25642 Stiffness of left hand, not elsewhere classified: Secondary | ICD-10-CM | POA: Diagnosis not present

## 2023-03-01 DIAGNOSIS — M65332 Trigger finger, left middle finger: Secondary | ICD-10-CM | POA: Diagnosis not present

## 2023-03-01 DIAGNOSIS — R29898 Other symptoms and signs involving the musculoskeletal system: Secondary | ICD-10-CM | POA: Diagnosis not present

## 2023-03-01 DIAGNOSIS — M25642 Stiffness of left hand, not elsewhere classified: Secondary | ICD-10-CM | POA: Diagnosis not present

## 2023-03-08 DIAGNOSIS — M65332 Trigger finger, left middle finger: Secondary | ICD-10-CM | POA: Diagnosis not present

## 2023-03-08 DIAGNOSIS — R29898 Other symptoms and signs involving the musculoskeletal system: Secondary | ICD-10-CM | POA: Diagnosis not present

## 2023-03-08 DIAGNOSIS — M25642 Stiffness of left hand, not elsewhere classified: Secondary | ICD-10-CM | POA: Diagnosis not present

## 2023-03-14 DIAGNOSIS — M65332 Trigger finger, left middle finger: Secondary | ICD-10-CM | POA: Diagnosis not present

## 2023-03-22 ENCOUNTER — Inpatient Hospital Stay: Payer: Medicare Other | Attending: Oncology

## 2023-03-22 VITALS — BP 146/69 | HR 79 | Temp 97.7°F | Resp 20 | Ht 62.0 in | Wt 124.2 lb

## 2023-03-22 DIAGNOSIS — Z452 Encounter for adjustment and management of vascular access device: Secondary | ICD-10-CM | POA: Insufficient documentation

## 2023-03-22 DIAGNOSIS — Z85118 Personal history of other malignant neoplasm of bronchus and lung: Secondary | ICD-10-CM | POA: Diagnosis not present

## 2023-03-22 DIAGNOSIS — Z95828 Presence of other vascular implants and grafts: Secondary | ICD-10-CM

## 2023-03-22 DIAGNOSIS — Z8509 Personal history of malignant neoplasm of other digestive organs: Secondary | ICD-10-CM | POA: Diagnosis not present

## 2023-03-22 DIAGNOSIS — C3411 Malignant neoplasm of upper lobe, right bronchus or lung: Secondary | ICD-10-CM

## 2023-03-22 MED ORDER — HEPARIN SOD (PORK) LOCK FLUSH 100 UNIT/ML IV SOLN
500.0000 [IU] | Freq: Once | INTRAVENOUS | Status: AC
  Administered 2023-03-22: 500 [IU]

## 2023-03-22 MED ORDER — SODIUM CHLORIDE 0.9% FLUSH
10.0000 mL | Freq: Once | INTRAVENOUS | Status: AC
  Administered 2023-03-22: 10 mL

## 2023-03-22 NOTE — Patient Instructions (Signed)

## 2023-04-06 ENCOUNTER — Encounter: Payer: Self-pay | Admitting: Pulmonary Disease

## 2023-04-06 ENCOUNTER — Ambulatory Visit (INDEPENDENT_AMBULATORY_CARE_PROVIDER_SITE_OTHER): Payer: Medicare Other | Admitting: Pulmonary Disease

## 2023-04-06 VITALS — BP 110/68 | HR 90 | Temp 98.6°F | Ht 62.0 in | Wt 123.6 lb

## 2023-04-06 DIAGNOSIS — J449 Chronic obstructive pulmonary disease, unspecified: Secondary | ICD-10-CM

## 2023-04-06 DIAGNOSIS — Z85118 Personal history of other malignant neoplasm of bronchus and lung: Secondary | ICD-10-CM

## 2023-04-06 DIAGNOSIS — Z720 Tobacco use: Secondary | ICD-10-CM

## 2023-04-06 DIAGNOSIS — Z87891 Personal history of nicotine dependence: Secondary | ICD-10-CM | POA: Diagnosis not present

## 2023-04-06 NOTE — Progress Notes (Signed)
Synopsis: Referred in September 2020 for COPD management by Juliette Alcide, MD  Subjective:   PATIENT ID: Melissa Golden GENDER: female DOB: 1946-05-10, MRN: [id number]  Chief Complaint  Patient presents with   Follow-up    Pt is feeling out of breath.     77 year old female past medical history of non-small cell lung cancer.  Diagnosed in 2018 with 3 hyper medic right upper lobe pulmonary nodules.  Had a CT-guided biopsy of the upper lobe nodule in December 2018 consistent with non-small cell lung cancer, TTF-1 positive p63 negative, PD-L1 95%.  In February 2019 had new mediastinal adenopathy within the prevascular and right paratracheal lymph node.  Biopsy of the paratracheal lymph node revealed metastatic lung primary.  Patient underwent 6 weeks of Taxol carboplatinum followed by durvalumab.  Repeat CT chest in June 2020 with evidence of right lung radiation and stable nodules.  Patient also has a history of adenocarcinoma/goblet cell carcinoid of the appendix.  This was in 2018 as well.  Patient underwent laparoscopic right hemicolectomy at [facility].  Patient underwent adjuvant Xeloda.  Patient is followed here in [facility] by Dr. Truett Perna her oncologist.  Patient was referred to see me for COPD management.  And complaints of chronic congestion.  OV 08/13/2019: First diagnosed with COPD back in 2018. Currently on pulmicort + albuterol. She is using her albuterol 3 times per day. She has a travel neb machine but it doesn't work. No fevers, chills. She has a chronic cough. Occassional brings up sputum. She feels like she wheezes all of the time. She lives in [place]. Lives with her husband. Former smoker, quit in 2017, smoked for 50+ years, 1.5-2ppd.  Patient is on 2 L nasal cannula pulse POC.  OV 09/24/2019: Here today for follow-up regarding lung cancer COPD.Patient last seen in September 2020.  Patient doing well today.  Has been using her new inhaler regimen.  Currently using Symbicort plus  Spiriva Respimat and as needed albuterol.  Her breathing is much improved as where she was before.  She would like to have a nebulizer at home.  She has a older machine but no tubing to connect to it.  We will get her DME supplies for this today.  She is able to keep most of her activities of daily living.  She does get dyspneic with significant exertion.  Denies fever chills night sweats weight loss denies hemoptysis.  OV 10/14/2020: Patient here today for follow-up of COPD.  Please see documented lung cancer and colon cancer history as above.  Overall doing well.  She is on Symbicort plus Spiriva at this time.  She felt like she was doing much better on Trelegy.  But due to insurance she had to switch back.  She would like to try again see if insurance would pay for Trelegy if at all possible.  She did better with this.  From a respiratory standpoint still has ongoing cough.  This is not much different from her previous cough but she seems to be annoyed by nonproductive.  Patient denies hemoptysis weight loss.  She had an recent noncontrasted CT of the chest which revealed stability of lung nodule.  This was reviewed with her and her oncologist recently.  OV 04/27/2021: doing well today. Breathing stable. Doing well with trelegy. Occasional wheezing. No chest tightness.  Patient doing well today overall.  Needs refills of all of her inhaler medications.  She has follow-up with oncology this week as well.  Going on vacation  this summer.  OV 10/30/2022: From respiratory standpoint she is doing okay.  Has been on triple therapy with Trelegy.  She is concerned about cost.  Wondering if there is an alternative.  She uses her oxygen regularly.  Here with her husband today.  OV 04/06/2023: uses trelegy daily.  Uses her oxygen supplementation.  [place] for [place] in the past.  Has not had a response.  I will have the office follow-up on this.  Still using Trelegy daily at this time.  From respiratory  standpoint doing okay.  No recent exacerbations.  She did have a left arm fracture.  She is recovered from this this past year.     Past Medical History:  Diagnosis Date   Asthma    Cancer (HCC) 2017   colon   chemo tx   COPD (chronic obstructive pulmonary disease) (HCC)    wears 3L home O2   Ileus following gastrointestinal surgery (HCC) 09/12/2017   required TPN support   Lung cancer (HCC) dx'd 10/2016   Pleural effusion, right    Shortness of breath    with exertion     Family History  Problem Relation Age of Onset   Cancer Mother    Heart attack Father    Cancer - Colon Sister      Past Surgical History:  Procedure Laterality Date   ABDOMINAL HYSTERECTOMY     APPENDECTOMY     BREAST SURGERY     Left cyst removal   CATARACT EXTRACTION W/ INTRAOCULAR LENS IMPLANT     Right   CATARACT EXTRACTION W/PHACO  01/02/2013   Procedure: CATARACT EXTRACTION PHACO AND INTRAOCULAR LENS PLACEMENT (IOC);  Surgeon: Gemma Payor, MD;  Location: [facility];  Service: Ophthalmology;  Laterality: Left;  CDE: 16.19   COLON SURGERY     DILATION AND CURETTAGE OF UTERUS     EYE SURGERY     IR IMAGING GUIDED PORT INSERTION  05/31/2018   IR THORACENTESIS ASP PLEURAL SPACE W/IMG GUIDE  02/19/2018   LAPAROSCOPIC APPENDECTOMY  09/12/2017   laparoscopic hemicolectomy Right 12/07/2017   DR. Stitzenberg and [facility]   LUNG BIOPSY     LYMPH NODE BIOPSY N/A 02/04/2018   Procedure: LYMPH NODE BIOPSY;  Surgeon: Alleen Borne, MD;  Location: [facility] OR;  Service: Thoracic;  Laterality: N/A;   MEDIASTINOSCOPY N/A 02/04/2018   Procedure: MEDIASTINOSCOPY;  Surgeon: Alleen Borne, MD;  Location: [facility] OR;  Service: Thoracic;  Laterality: N/A;   ORIF HUMERUS FRACTURE Left 10/11/2022   Procedure: OPEN REDUCTION INTERNAL FIXATION OF LEFT DISTAL HUMERUS FRACTURE;  Surgeon: Roby Lofts, MD;  Location: [facility] OR;  Service: Orthopedics;  Laterality: Left;   PLEURAL EFFUSION DRAINAGE Right 03/01/2018   Procedure:  DRAINAGE OF PLEURAL EFFUSION;  Surgeon: Alleen Borne, MD;  Location: [facility] OR;  Service: Thoracic;  Laterality: Right;   VIDEO ASSISTED THORACOSCOPY Right 03/01/2018   Procedure: VIDEO ASSISTED THORACOSCOPY;  Surgeon: Alleen Borne, MD;  Location: [facility];  Service: Thoracic;  Laterality: Right;    Social History   Socioeconomic History   Marital status: Married    Spouse name: Not on file   Number of children: Not on file   Years of education: Not on file   Highest education level: Not on file  Occupational History   Occupation: retired  Tobacco Use   Smoking status: Former    Packs/day: 1.50    Years: 68.00    Additional pack years: 0.00  Total pack years: 102.00    Types: Cigarettes    Quit date: 10/2016    Years since quitting: 6.4   Smokeless tobacco: Never  Vaping Use   Vaping Use: Never used  Substance and Sexual Activity   Alcohol use: Not Currently    Alcohol/week: 6.0 standard drinks of alcohol    Types: 6 Cans of beer per week   Drug use: No   Sexual activity: Not on file  Other Topics Concern   Not on file  Social History Narrative   Not on file   Social Determinants of Health   Financial Resource Strain: Not on file  Food Insecurity: No Food Insecurity (10/11/2022)   Hunger Vital Sign    Worried About Running Out of Food in the Last Year: Never true    Ran Out of Food in the Last Year: Never true  Transportation Needs: No Transportation Needs (10/11/2022)   PRAPARE - Administrator, Civil Service (Medical): No    Lack of Transportation (Non-Medical): No  Physical Activity: Not on file  Stress: Not on file  Social Connections: Not on file  Intimate Partner Violence: Not At Risk (10/11/2022)   Humiliation, Afraid, Rape, and Kick questionnaire    Fear of Current or Ex-Partner: No    Emotionally Abused: No    Physically Abused: No    Sexually Abused: No     No Known Allergies   Outpatient Medications Prior to Visit  Medication Sig  Dispense Refill   acetaminophen (TYLENOL) 500 MG tablet Take 2 tablets (1,000 mg total) by mouth every 6 (six) hours. 30 tablet 0   albuterol (PROVENTIL) (2.5 MG/3ML) 0.083% nebulizer solution Take 3 mLs (2.5 mg total) by nebulization every 4 (four) hours as needed for wheezing or shortness of breath. 75 mL 11   Ascorbic Acid (VITAMIN C) 100 MG tablet Take 100 mg by mouth 2 (two) times daily with a meal.     Coenzyme Q10 (COQ-10 PO) Take 1 tablet by mouth daily.     Fluticasone-Umeclidin-Vilant (TRELEGY ELLIPTA) 100-62.5-25 MCG/ACT AEPB Inhale 1 puff into the lungs daily. 60 each 5   lidocaine-prilocaine (EMLA) cream Apply to portacath 30 min to 1 hour prior to flush or infusions 30 g 1   montelukast (SINGULAIR) 10 MG tablet Take 1 tablet (10 mg total) by mouth at bedtime. 90 tablet 5   Multiple Vitamin (MULTIVITAMIN WITH MINERALS) TABS Take 1 tablet by mouth daily.     OXYGEN Inhale 2 L into the lungs continuous. For COPD     Respiratory Therapy Supplies (FLUTTER) DEVI 1 Device by Does not apply route as needed. 1 each 0   VENTOLIN HFA 108 (90 Base) MCG/ACT inhaler Inhale 1-2 puffs into the lungs every 4 (four) hours as needed for wheezing or shortness of breath. 18 g 11   benzonatate (TESSALON) 200 MG capsule Take 1 capsule (200 mg total) by mouth 3 (three) times daily as needed for cough. (Patient not taking: Reported on 11/16/2022) 90 capsule 0   oxyCODONE (OXY IR/ROXICODONE) 5 MG immediate release tablet Take 1 tablet (5 mg total) by mouth every 4 (four) hours as needed for severe pain. (Patient not taking: Reported on 04/06/2023) 42 tablet 0   No facility-administered medications prior to visit.    Review of Systems  Constitutional:  Negative for chills, fever, malaise/fatigue and weight loss.  HENT:  Negative for hearing loss, sore throat and tinnitus.   Eyes:  Negative for blurred vision and  double vision.  Respiratory:  Positive for cough, sputum production and shortness of breath.  Negative for hemoptysis, wheezing and stridor.   Cardiovascular:  Negative for chest pain, palpitations, orthopnea, leg swelling and PND.  Gastrointestinal:  Negative for abdominal pain, constipation, diarrhea, heartburn, nausea and vomiting.  Genitourinary:  Negative for dysuria, hematuria and urgency.  Musculoskeletal:  Negative for joint pain and myalgias.  Skin:  Negative for itching and rash.  Neurological:  Negative for dizziness, tingling, weakness and headaches.  Endo/Heme/Allergies:  Negative for environmental allergies. Does not bruise/bleed easily.  Psychiatric/Behavioral:  Negative for depression. The patient is not nervous/anxious and does not have insomnia.   All other systems reviewed and are negative.    Objective:  Physical Exam Vitals reviewed.  Constitutional:      General: She is not in acute distress.    Appearance: She is well-developed.  HENT:     Head: Normocephalic and atraumatic.     Mouth/Throat:     Pharynx: No oropharyngeal exudate.  Eyes:     Conjunctiva/sclera: Conjunctivae normal.     Pupils: Pupils are equal, round, and reactive to light.  Neck:     Vascular: No JVD.     Trachea: No tracheal deviation.     Comments: Loss of supraclavicular fat Cardiovascular:     Rate and Rhythm: Normal rate and regular rhythm.     Heart sounds: S1 normal and S2 normal.     Comments: Distant heart tones Pulmonary:     Effort: No tachypnea or accessory muscle usage.     Breath sounds: No stridor. Decreased breath sounds (throughout all lung fields) and rhonchi present. No wheezing or rales.     Comments: Severely diminished breath sounds bilaterally Abdominal:     General: Bowel sounds are normal. There is no distension.     Palpations: Abdomen is soft.     Tenderness: There is no abdominal tenderness.  Musculoskeletal:        General: Deformity (muscle wasting ) present.  Skin:    General: Skin is warm and dry.     Capillary Refill: Capillary refill takes  less than 2 seconds.     Findings: No rash.  Neurological:     Mental Status: She is alert and oriented to person, place, and time.  Psychiatric:        Behavior: Behavior normal.      Vitals:   04/06/23 1305  BP: 110/68  Pulse: 90  Temp: 98.6 F (37 C)  TempSrc: Oral  SpO2: 98%  Weight: 123 lb 9.6 oz (56.1 kg)  Height: 5\' 2"  (1.575 m)   98% on RA BMI Readings from Last 3 Encounters:  04/06/23 22.61 kg/m  03/22/23 22.72 kg/m  12/28/22 22.24 kg/m   Wt Readings from Last 3 Encounters:  04/06/23 123 lb 9.6 oz (56.1 kg)  03/22/23 124 lb 3.2 oz (56.3 kg)  12/28/22 121 lb 9.6 oz (55.2 kg)     CBC    Component Value Date/Time   WBC 4.7 10/12/2022 0603   RBC 2.39 (L) 10/12/2022 0603   HGB 7.5 (L) 10/12/2022 0603   HGB 12.6 07/19/2021 1113   HCT 23.2 (L) 10/12/2022 0603   PLT 165 10/12/2022 0603   PLT 138 (L) 07/19/2021 1113   MCV 97.1 10/12/2022 0603   MCH 31.4 10/12/2022 0603   MCHC 32.3 10/12/2022 0603   RDW 11.9 10/12/2022 0603   LYMPHSABS 0.8 07/19/2021 1113   MONOABS 0.4 07/19/2021 1113   EOSABS 0.0  07/19/2021 1113   BASOSABS 0.0 07/19/2021 1113    Chest Imaging: June 2020 CT chest: Paramediastinal right upper lobe lung radiation no evidence of recurrent tumor.  No evidence of metastatic disease.  Moderate amount of centrilobular emphysema. The patient's images have been independently reviewed by me.    CT chest 10/04/2020: Patient has evidence of significant emphysema within the chest.  She has a small remnant of a loculated pleural effusion with the right hemithorax.  There is mild nodular density within the left lower lobe unchanged from previous CT imaging. The patient's images have been independently reviewed by me.      Pulmonary Functions Testing Results:    Latest Ref Rng & Units 10/27/2021    9:52 AM 12/05/2016    8:42 AM  PFT Results  FVC-Pre L 1.55  1.32   FVC-Predicted Pre % 58  46   FVC-Post L 1.51  1.74   FVC-Predicted Post % 56  61    Pre FEV1/FVC % % 47  45   Post FEV1/FCV % % 48  47   FEV1-Pre L 0.73  0.60   FEV1-Predicted Pre % 36  27   FEV1-Post L 0.73  0.81   DLCO uncorrected ml/min/mmHg 6.88  8.30   DLCO UNC% % 37  37   DLCO corrected ml/min/mmHg 6.88    DLCO COR %Predicted % 37    DLVA Predicted % 53  51   TLC L 4.36  5.50   TLC % Predicted % 90  113   RV % Predicted % 119  186     FeNO: None   Pathology:  CT Guided BX 2018: Lung cancer: NSCLC, +TTF1, +PDL1  Mediastinoscopy 02/04/2018: Metastatic lung cancer  Echocardiogram: None   Heart Catheterization: None     Assessment & Plan:     ICD-10-CM   1. Chronic obstructive pulmonary disease, unspecified COPD type (HCC)  J44.9     2. Stage 3 severe COPD by GOLD classification (HCC)  J44.9     3. Tobacco abuse  Z72.0     4. History of lung cancer  Z85.118     5. Former smoker  Z87.891       Discussion:  This is 77 year old female, severe COPD based on PFTs in 2019, chronic hypoxemic respiratory failure 2 L nasal cannula baseline.  Patient has triple therapy inhaler regimen.  Plan: Continue triple therapy inhaler.  We attempted to switch to [facility] to help with financial aid through [facility] and me application.  I do not think she is heard back from this. She would like for me to check on it. Continue albuterol as needed. Continue O2 supplies for POC. Goal SpO2 greater than 88%. If she has frequent exacerbations in the future may consider 250 mg azithromycin Monday Wednesday Friday. At this time however she has been doing fine no recent exacerbations continue Trelegy. RTC in 1 year or as needed for COPD management.     Current Outpatient Medications:    acetaminophen (TYLENOL) 500 MG tablet, Take 2 tablets (1,000 mg total) by mouth every 6 (six) hours., Disp: 30 tablet, Rfl: 0   albuterol (PROVENTIL) (2.5 MG/3ML) 0.083% nebulizer solution, Take 3 mLs (2.5 mg total) by nebulization every 4 (four) hours as needed for wheezing or shortness of  breath., Disp: 75 mL, Rfl: 11   Ascorbic Acid (VITAMIN C) 100 MG tablet, Take 100 mg by mouth 2 (two) times daily with a meal., Disp: , Rfl:    Coenzyme Q10 (  COQ-10 PO), Take 1 tablet by mouth daily., Disp: , Rfl:    Fluticasone-Umeclidin-Vilant (TRELEGY ELLIPTA) 100-62.5-25 MCG/ACT AEPB, Inhale 1 puff into the lungs daily., Disp: 60 each, Rfl: 5   lidocaine-prilocaine (EMLA) cream, Apply to portacath 30 min to 1 hour prior to flush or infusions, Disp: 30 g, Rfl: 1   montelukast (SINGULAIR) 10 MG tablet, Take 1 tablet (10 mg total) by mouth at bedtime., Disp: 90 tablet, Rfl: 5   Multiple Vitamin (MULTIVITAMIN WITH MINERALS) TABS, Take 1 tablet by mouth daily., Disp: , Rfl:    OXYGEN, Inhale 2 L into the lungs continuous. For COPD, Disp: , Rfl:    Respiratory Therapy Supplies (FLUTTER) DEVI, 1 Device by Does not apply route as needed., Disp: 1 each, Rfl: 0   VENTOLIN HFA 108 (90 Base) MCG/ACT inhaler, Inhale 1-2 puffs into the lungs every 4 (four) hours as needed for wheezing or shortness of breath., Disp: 18 g, Rfl: 11   benzonatate (TESSALON) 200 MG capsule, Take 1 capsule (200 mg total) by mouth 3 (three) times daily as needed for cough. (Patient not taking: Reported on 11/16/2022), Disp: 90 capsule, Rfl: 0   oxyCODONE (OXY IR/ROXICODONE) 5 MG immediate release tablet, Take 1 tablet (5 mg total) by mouth every 4 (four) hours as needed for severe pain. (Patient not taking: Reported on 04/06/2023), Disp: 42 tablet, Rfl: 0   Josephine Igo, DO [facility] 04/06/2023 1:21 PM

## 2023-04-06 NOTE — Patient Instructions (Signed)
Thank you for visiting Dr. Tonia Brooms at [facility]. Today we recommend the following:  Continue trelegy. [place] on [place] breztri.  Give samples of breztri today   Return in about 1 year (around 04/05/2024) for with APP or Dr. Tonia Brooms.    Please do your part to reduce the spread of COVID-19.

## 2023-04-24 DIAGNOSIS — S42412D Displaced simple supracondylar fracture without intercondylar fracture of left humerus, subsequent encounter for fracture with routine healing: Secondary | ICD-10-CM | POA: Diagnosis not present

## 2023-05-17 ENCOUNTER — Inpatient Hospital Stay: Payer: Medicare Other

## 2023-05-17 ENCOUNTER — Inpatient Hospital Stay: Payer: Medicare Other | Attending: Oncology | Admitting: Oncology

## 2023-05-17 VITALS — BP 142/75 | HR 81 | Temp 98.1°F | Resp 18 | Ht 62.0 in | Wt 123.7 lb

## 2023-05-17 DIAGNOSIS — Z8509 Personal history of malignant neoplasm of other digestive organs: Secondary | ICD-10-CM | POA: Diagnosis not present

## 2023-05-17 DIAGNOSIS — Z452 Encounter for adjustment and management of vascular access device: Secondary | ICD-10-CM | POA: Insufficient documentation

## 2023-05-17 DIAGNOSIS — C3411 Malignant neoplasm of upper lobe, right bronchus or lung: Secondary | ICD-10-CM | POA: Diagnosis not present

## 2023-05-17 DIAGNOSIS — Z85118 Personal history of other malignant neoplasm of bronchus and lung: Secondary | ICD-10-CM | POA: Diagnosis not present

## 2023-05-17 DIAGNOSIS — R42 Dizziness and giddiness: Secondary | ICD-10-CM | POA: Diagnosis not present

## 2023-05-17 DIAGNOSIS — Z95828 Presence of other vascular implants and grafts: Secondary | ICD-10-CM

## 2023-05-17 DIAGNOSIS — Z9221 Personal history of antineoplastic chemotherapy: Secondary | ICD-10-CM | POA: Diagnosis not present

## 2023-05-17 DIAGNOSIS — Z923 Personal history of irradiation: Secondary | ICD-10-CM | POA: Diagnosis not present

## 2023-05-17 MED ORDER — SODIUM CHLORIDE 0.9% FLUSH
10.0000 mL | Freq: Once | INTRAVENOUS | Status: AC
  Administered 2023-05-17: 10 mL

## 2023-05-17 MED ORDER — HEPARIN SOD (PORK) LOCK FLUSH 100 UNIT/ML IV SOLN
500.0000 [IU] | Freq: Once | INTRAVENOUS | Status: AC
  Administered 2023-05-17: 500 [IU]

## 2023-05-17 NOTE — Patient Instructions (Signed)

## 2023-05-17 NOTE — Progress Notes (Signed)
[facility] OFFICE PROGRESS NOTE   Diagnosis: Non-small cell lung cancer  INTERVAL HISTORY:   [name] returns as scheduled.  She reports stable dyspnea and cough.  No fever.  Good appetite.  She has developed positional vertigo over the past few days.  The vertigo is worse when going from lying to sitting and when turning to the right when she is lying down.  Objective:  Vital signs in last 24 hours:  Blood pressure (!) 142/75, pulse 81, temperature 98.1 F (36.7 C), temperature source Oral, resp. rate 18, height 5\' 2"  (1.575 m), weight 123 lb 11.2 oz (56.1 kg), SpO2 96 %.     Lymphatics: No cervical, supraclavicular, or axillary nodes Resp: Lungs with distant breath sounds, no respiratory distress Cardio: Regular rate and rhythm GI: No hepatosplenomegaly Vascular: No leg edema Neuro: Alert and oriented, ambulated to the exam table without difficulty.  No nystagmus.   Lab Results:  Lab Results  Component Value Date   WBC 4.7 10/12/2022   HGB 7.5 (L) 10/12/2022   HCT 23.2 (L) 10/12/2022   MCV 97.1 10/12/2022   PLT 165 10/12/2022   NEUTROABS 3.5 07/19/2021    CMP  Lab Results  Component Value Date   NA 142 10/12/2022   K 3.5 10/12/2022   CL 99 10/12/2022   CO2 32 10/12/2022   GLUCOSE 93 10/12/2022   BUN 12 10/12/2022   CREATININE 0.64 10/12/2022   CALCIUM 8.3 (L) 10/12/2022   PROT 6.9 07/19/2021   ALBUMIN 3.8 07/19/2021   AST 16 07/19/2021   ALT 10 07/19/2021   ALKPHOS 63 07/19/2021   BILITOT 0.5 07/19/2021   GFRNONAA >60 10/12/2022   GFRAA >60 06/17/2020    Lab Results  Component Value Date   CEA1 2.44 04/19/2020      Medications: I have reviewed the patient's current medications.   Assessment/Plan: Adenocarcinoma/goblet cell carcinoid of the appendix CT scan of the abdomen/pelvis on 09/12/2017 - fluid-filled hypervascular appendix with mild reactive enteritis within the ileum.   Status post laparoscopic appendectomy  09/12/2017.  Postoperative course complicated by an ileus requiring TPN.   Final pathology of the appendix-5 cm adenocarcinoma/goblet cell carcinoid.  The tumor extended through the muscularis propria into subserosal soft tissue and mesoappendix and focally to the serosal surface.  Lymphovascular space invasion identified.  Perineural invasion identified.  Associated acute appendicitis with perforation. 10/15/2017 CEA 1. 11/22/2017 chromogranin A-3 Status post laparoscopic right hemicolectomy by Dr. Glennis Brink at [facility] on 12/07/2017.  No carcinomatosis or liver metastases were seen.   Final pathology showed metastatic adenocarcinoma/goblet cell carcinoid in 3 of 15 pericolic lymph nodes; maximum size of largest metastasis 0.2 cm; positive for extracapsular extension.  Benign terminal ileum with fibrous serosal adhesions, negative for dysplasia or carcinoma; surgically absent appendix; appendectomy site with postsurgical changes and fibrous serosal adhesions, negative for carcinoma; proximal and distal mucosal margins negative for dysplasia or carcinoma; mesenteric resection margin negative for carcinoma. Cycle 1 adjuvant Xeloda 01/07/2018 Cycle 2 adjuvant Xeloda 01/28/2018 Non-small cell lung cancer PET scan 10/17/2017-3 hypermetabolic right upper lobe pulmonary nodules increased in size and metabolic activity.  No mediastinal nodal metastasis.  4 mm right upper lobe nodule with metabolic activity, SUV 3.6.  A third right upper lobe nodule measures 4 mm and has faint metabolic activity.  No clear evidence of malignancy in the abdomen or pelvis.  Mild activity along the ventral abdominal surgical scar.  Scattered activity in small bowel without focal lesion on CT. CT  biopsy right upper lobe nodule 11/02/2017 showed non-small cell carcinoma; malignant cells positive for TTF-1 and negative for p63.  PD-L1 95%. Chest CT 01/21/2018-new mediastinal adenopathy seen in the prevascular and right paratracheal stations.   Largest lymph node is seen in the prevascular space measuring 2.0 cm, previously 6 mm.  No hilar or axillary adenopathy.  Right lung nodules are stable. Biopsy of a right paratracheal lymph node on 02/04/2018-metastatic adenocarcinoma consistent with a lung primary Chest radiation started 03/11/2018 Week 1 Taxol/carboplatin 03/13/2018 Week 2 Taxol/carboplatin 03/20/2018 Week 3 Taxol/carboplatin 03/27/2018 Week  4Taxol/carboplatin 04/03/2018 Week 5 Taxol/carboplatin 04/10/2018 Week 6 Taxol/carboplatin 04/19/2018 Chest radiation completed 04/25/2018 CT chest 05/23/2018-interval decrease in size of mediastinal adenopathy.  Near complete resolution of previously visualized right pleural effusion.  The right lung nodules have decreased in size.  Other nodules are stable. Cycle 1 durvalumab 05/23/2018 Cycle 2 durvalumab 06/06/2018 Cycle 3 durvalumab 06/20/2018 Cycle 4 durvalumab 07/04/2018 Cycle 5 durvalumab 07/19/2018 Cycle 6 durvalumab 08/01/2018 Cycle 7 durvalumab 08/15/2018 CT chest 09/03/2018- collapse/consolidation with bronchiectasis and surrounding groundglass involving primarily the right upper lobe, findings most indicative of recent radiation therapy. Previously measured right upper lobe nodule obscured.  New areas of nodularity in the right lower lobe, likely infectious or inflammatory. Cycle 8 durvalumab 09/04/2018 Cycle 9 durvalumab 09/18/2018 Cycle 10 durvalumab 10/02/2018 Cycle 11 durvalumab 10/16/2018  Cycle 12 durvalumab 11/13/2018 Cycle 13 durvalumab 11/30/2018 Cycle 14 durvalumab 12/11/2018 CT chest 12/16/2018 [facility])- no mediastinal, axillary or hilar adenopathy.  Centrilobular emphysema.  Right apical volume loss with bronchiectasis traversing the area of atelectatic lung.  Elevation of the adjacent minor fissure due to volume loss.  Subpleural left upper lobe opacity, nonspecific, measuring 8.7 mm.  Compression fractures T5 and T7. Cycle 15 durvalumab 12/25/2018 Cycle 16 durvalumab  01/29/2019 Cycle 17 durvalumab 02/12/2019 Cycle 18 durvalumab 02/26/2019 Cycle 19 durvalumab 03/12/2019 Cycle 20 durvalumab 03/26/2019 Cycle 21 durvalumab 04/16/2019 Cycle 22 durvalumab 04/30/2019 CT chest 05/12/2019- right lung radiation fibrosis, no evidence of local tumor recurrence, no evidence of metastatic disease in the chest, stable pulmonary nodules Cycle 23 durvalumab 05/14/2019 CT chest 10/13/2019-stable radiation changes in the right lung, new nodules in the right lower lobe, left lower lobe, and lingula-favored to represent an inflammatory process CT chest 12/30/2019-no findings to suggest recurrent or metastatic disease, subtle groundglass at the site of previous nodularity in the right lung base almost completely resolved CT chest 06/17/2020-6 mm left lower lobe nodule-more conspicuous, stable compression deformities at T5 and T7 CT chest 10/04/2020-nodular density medial left lower lobe unchanged.  Adjacent peribronchovascular groundglass and volume loss have increased slightly and are likely infectious or inflammatory. CT chest 04/28/2021-new interstitial and nodular opacities in the right upper lobe laterally, favoring pneumonia, tumor and lymphangitic spread considered less likely.  Mild increase in loculated right pleural effusion.  Mild increase in atelectasis or nodular scarring peripherally at the left lung base.  Stable compression fractures at T5 and T7. CT chest 07/19/2021-previously described patchy nodular foci of consolidation in the upper right lung and left lung base have resolved compatible with resolved multilobar pneumonia.  Stable radiation fibrosis upper right lung with no evidence of local tumor recurrence.  No findings suspicious for metastatic disease in the chest. CT chest 02/07/2022-interval development of bilateral irregular pulmonary nodules right greater than left. CT chest 06/01/2022-unchanged posttreatment appearance of the right chest, irregular nodules and groundglass  opacities in the bilateral lung bases have improved, emphysema, enlargement the distal descending and proximal abdominal aorta CT chest 11/16/2022-stable  right perihilar postradiation change, new clustered irregular nodules at the right lower lobe CT chest 02/08/2023-stable appearance of upper lobe consolidation changes, increase in basilar nodularity, emphysema, osteopenia     COPD   4.   VATS drainage of a loculated right pleural effusion 03/01/2018   5.  Port-A-Cath placement 05/31/2018   6.  Severe mid back pain 10/30/2018-CT thoracic spine- T5 compression fracture new since 09/03/2018- appearance consistent with an osteoporotic fracture.  Pain improved.  Increased back pain January 2020.  CT scan 12/16/2018- new compression fractures T5 and T7.  T5 vertebral body somewhat sclerotic in appearance.  MRI thoracic spine 01/08/2019-acute compression fractures involving the T5 and T7 vertebral bodies stable relative to recent CT with up to 50% height loss at T5 and 25% height loss at T7.  No associated bony retropulsion.  Benign/mechanical features by MRI with no appreciable underlying pathologic lesion.  No other evidence for metastatic disease within the thoracic spine.   7.  Positional vertigo 05/17/2023       Disposition: Ms. Steenson is in clinical remission from lung cancer.  I reviewed the most recent CT findings and images with her.  She has nodular changes at the lower lungs, likely related to chronic infection or scarring.  I have a low clinical sufficient for progression of lung cancer.  She will return for an office visit and CT chest in 3 months.  She has developed vertigo symptoms for the past several days.  She will call if the vertigo symptoms persist.  Will make referral to neurology or vestibular physical therapy.  Thornton Papas, MD  05/17/2023  11:52 AM

## 2023-05-21 DIAGNOSIS — M7502 Adhesive capsulitis of left shoulder: Secondary | ICD-10-CM | POA: Diagnosis not present

## 2023-05-27 DIAGNOSIS — E782 Mixed hyperlipidemia: Secondary | ICD-10-CM | POA: Diagnosis not present

## 2023-05-27 DIAGNOSIS — J441 Chronic obstructive pulmonary disease with (acute) exacerbation: Secondary | ICD-10-CM | POA: Diagnosis not present

## 2023-06-15 DIAGNOSIS — U071 COVID-19: Secondary | ICD-10-CM | POA: Diagnosis not present

## 2023-06-28 ENCOUNTER — Inpatient Hospital Stay: Payer: Medicare Other

## 2023-06-28 ENCOUNTER — Inpatient Hospital Stay: Payer: Medicare Other | Attending: Oncology

## 2023-06-28 VITALS — BP 140/86 | HR 89 | Temp 98.0°F | Resp 18 | Ht 62.0 in | Wt 124.1 lb

## 2023-06-28 DIAGNOSIS — Z95828 Presence of other vascular implants and grafts: Secondary | ICD-10-CM

## 2023-06-28 DIAGNOSIS — Z85118 Personal history of other malignant neoplasm of bronchus and lung: Secondary | ICD-10-CM | POA: Insufficient documentation

## 2023-06-28 DIAGNOSIS — Z452 Encounter for adjustment and management of vascular access device: Secondary | ICD-10-CM | POA: Diagnosis not present

## 2023-06-28 DIAGNOSIS — C3411 Malignant neoplasm of upper lobe, right bronchus or lung: Secondary | ICD-10-CM

## 2023-06-28 MED ORDER — SODIUM CHLORIDE 0.9% FLUSH
10.0000 mL | Freq: Once | INTRAVENOUS | Status: AC
  Administered 2023-06-28: 10 mL

## 2023-06-28 MED ORDER — HEPARIN SOD (PORK) LOCK FLUSH 100 UNIT/ML IV SOLN
500.0000 [IU] | Freq: Once | INTRAVENOUS | Status: AC
  Administered 2023-06-28: 500 [IU]

## 2023-06-28 NOTE — Patient Instructions (Signed)

## 2023-07-03 ENCOUNTER — Ambulatory Visit: Payer: Medicare Other | Attending: Student | Admitting: Physical Therapy

## 2023-07-03 ENCOUNTER — Other Ambulatory Visit: Payer: Self-pay

## 2023-07-03 ENCOUNTER — Encounter: Payer: Self-pay | Admitting: Physical Therapy

## 2023-07-03 DIAGNOSIS — M25622 Stiffness of left elbow, not elsewhere classified: Secondary | ICD-10-CM | POA: Insufficient documentation

## 2023-07-03 DIAGNOSIS — M25522 Pain in left elbow: Secondary | ICD-10-CM | POA: Diagnosis not present

## 2023-07-03 NOTE — Therapy (Signed)
OUTPATIENT PHYSICAL THERAPY UPPER EXTREMITY EVALUATION   Patient Name: KRICKET LEN MRN: [id number] DOB:06-17-1946, 77 y.o., female Today's Date: 07/03/2023  END OF SESSION:  PT End of Session - 07/03/23 1511     Visit Number 1    Number of Visits 12    Date for PT Re-Evaluation 10/01/23    Authorization Type FOTO.    PT Start Time 0232    PT Stop Time 0300    PT Time Calculation (min) 28 min    Activity Tolerance Patient tolerated treatment well    Behavior During Therapy [facility] for tasks assessed/performed             Past Medical History:  Diagnosis Date   Asthma    Cancer (HCC) 2017   colon   chemo tx   COPD (chronic obstructive pulmonary disease) (HCC)    wears 3L home O2   Ileus following gastrointestinal surgery (HCC) 09/12/2017   required TPN support   Lung cancer (HCC) dx'd 10/2016   Pleural effusion, right    Shortness of breath    with exertion   Past Surgical History:  Procedure Laterality Date   ABDOMINAL HYSTERECTOMY     APPENDECTOMY     BREAST SURGERY     Left cyst removal   CATARACT EXTRACTION W/ INTRAOCULAR LENS IMPLANT     Right   CATARACT EXTRACTION W/PHACO  01/02/2013   Procedure: CATARACT EXTRACTION PHACO AND INTRAOCULAR LENS PLACEMENT (IOC);  Surgeon: Gemma Payor, MD;  Location: [facility];  Service: Ophthalmology;  Laterality: Left;  CDE: 16.19   COLON SURGERY     DILATION AND CURETTAGE OF UTERUS     EYE SURGERY     IR IMAGING GUIDED PORT INSERTION  05/31/2018   IR THORACENTESIS ASP PLEURAL SPACE W/IMG GUIDE  02/19/2018   LAPAROSCOPIC APPENDECTOMY  09/12/2017   laparoscopic hemicolectomy Right 12/07/2017   DR. Stitzenberg and [facility]   LUNG BIOPSY     LYMPH NODE BIOPSY N/A 02/04/2018   Procedure: LYMPH NODE BIOPSY;  Surgeon: Alleen Borne, MD;  Location: [facility] OR;  Service: Thoracic;  Laterality: N/A;   MEDIASTINOSCOPY N/A 02/04/2018   Procedure: MEDIASTINOSCOPY;  Surgeon: Alleen Borne, MD;  Location: [facility];  Service: Thoracic;   Laterality: N/A;   ORIF HUMERUS FRACTURE Left 10/11/2022   Procedure: OPEN REDUCTION INTERNAL FIXATION OF LEFT DISTAL HUMERUS FRACTURE;  Surgeon: Roby Lofts, MD;  Location: [facility];  Service: Orthopedics;  Laterality: Left;   PLEURAL EFFUSION DRAINAGE Right 03/01/2018   Procedure: DRAINAGE OF PLEURAL EFFUSION;  Surgeon: Alleen Borne, MD;  Location: [facility];  Service: Thoracic;  Laterality: Right;   VIDEO ASSISTED THORACOSCOPY Right 03/01/2018   Procedure: VIDEO ASSISTED THORACOSCOPY;  Surgeon: Alleen Borne, MD;  Location: [facility];  Service: Thoracic;  Laterality: Right;   Patient Active Problem List   Diagnosis Date Noted   Closed fracture of left distal humerus 10/11/2022   Bronchiectasis (HCC) 10/27/2021   Port-A-Cath in place 07/04/2018   S/P thoracotomy 03/01/2018   Chronic respiratory failure with hypoxia (HCC) 02/18/2018   Pleural effusion 02/18/2018   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  THERAPY DIAG:  Pain in left elbow  Rationale for Evaluation and Treatment: Rehabilitation  ONSET DATE: 10/12/23 (surgery date).  SUBJECTIVE:                                                                                                                                                                                      SUBJECTIVE STATEMENT: The patient presents to the clinic s/p ORIF of a left distal humeral fracture performed on 10/11/22. She has worked hard at home on exercises and states he has made good gains with regards to her range of motion.  Her CC is pain over her left extensor surface of her forearm especially when performed supination and pronation.  Pain at rest today is a 2/10 and increases with movement.  She states she  is not expected to achieve full left elbow extension due to hardware.  PERTINENT HISTORY: COPD, portable 02 at 2L/min, "in remission" "cancer-free for a year and a half."  PAIN:  Are you having pain? Yes: NPRS scale: 2/10 Pain location: Left forearm, extensor surface. Pain description: Sore. Aggravating factors: Pronation and supination of left forearm. Relieving factors: "Not turning forearm."  PRECAUTIONS: None  WEIGHT BEARING RESTRICTIONS: No  FALLS:  Has patient fallen in last 6 months? No  LIVING ENVIRONMENT: Lives with: lives with their spouse Lives in: House/apartment Has following equipment at home: None  OCCUPATION: Retired.  PLOF: Independent with basic ADLs  PATIENT GOALS: Decrease pain with forearm movement.    OBJECTIVE:   PATIENT SURVEYS :  FOTO      UPPER EXTREMITY ROM:   Active left shoulder antigravity flexion is 140 degrees, full ER and behind back movement.  Left active elbow extension is -15 degrees and flexion to 125 degrees, full left forearm active pronation and supination is 70 degrees.  UPPER EXTREMITY MMT:  Left deltoid and RTC strength is 4+/5.  Left elbow strength is 4 to 4+/5.   PALPATION:  Tender to palpation over proximal 1/3 of left extensor musculature region.   TODAY'S TREATMENT:  DATE:   PATIENT EDUCATION:  HOME EXERCISE PROGRAM:  ASSESSMENT:  CLINICAL IMPRESSION: The patient presents to OPPT s/p left distal humeral fracture performed on 10/11/22.  She has been very motivated and has worked hard at home to regain motion.  As expected she lacks some left elbow extension.  Her CC today is pain over the left forearm extensor region especially with active motion of her forearm.  Her strength is quite good.  Patient will benefit from skilled physical therapy intervention to address pain and  deficits.  OBJECTIVE IMPAIRMENTS: decreased activity tolerance, decreased ROM, decreased strength, increased muscle spasms, and pain.   ACTIVITY LIMITATIONS: carrying  PARTICIPATION LIMITATIONS: laundry  PERSONAL FACTORS: Time since onset of injury/illness/exacerbation and 1 comorbidity: left distal humeral fracture  are also affecting patient's functional outcome.   REHAB POTENTIAL: Excellent  CLINICAL DECISION MAKING: Stable/uncomplicated  EVALUATION COMPLEXITY: Low  GOALS:  SHORT TERM GOALS: Target date: 07/24/23  Ind with a HEP. Goal status: INITIAL   LONG TERM GOALS: Target date: 08/14/23  5/5 left UE strength grade.  Goal status: INITIAL  2.  Perform ADL's with pain not > 2/10.  Goal status: INITIAL  3.  Perform left active forearm movement with pain not > 2/10.  Goal status: INITIAL  PLAN: PT FREQUENCY:  1-2 times  PT DURATION: 6 weeks  PLANNED INTERVENTIONS: Therapeutic exercises, Therapeutic activity, Patient/Family education, Self Care, Dry Needling, Cryotherapy, Moist heat, and Manual therapy  PLAN FOR NEXT SESSION: Pulleys, RW4 beginning with yellow theraband, left elbow PRE, STW/M to left forearm extensor musculature.   , [place], PT 07/03/2023, 3:34 PM

## 2023-07-10 ENCOUNTER — Ambulatory Visit: Payer: Medicare Other | Admitting: Physical Therapy

## 2023-07-10 DIAGNOSIS — M25522 Pain in left elbow: Secondary | ICD-10-CM

## 2023-07-10 DIAGNOSIS — M25622 Stiffness of left elbow, not elsewhere classified: Secondary | ICD-10-CM

## 2023-07-10 NOTE — Therapy (Addendum)
OUTPATIENT PHYSICAL THERAPY UPPER EXTREMITY EVALUATION   Patient Name: Melissa Golden MRN: [id number] DOB:August 28, 1946, 77 y.o., female Today's Date: 07/10/2023  END OF SESSION:  PT End of Session - 07/10/23 1502     Visit Number 2    Number of Visits 12    Date for PT Re-Evaluation 10/01/23    Authorization Type FOTO.    PT Start Time 0230    PT Stop Time 0306    PT Time Calculation (min) 36 min    Activity Tolerance Patient tolerated treatment well    Behavior During Therapy [facility] for tasks assessed/performed             Past Medical History:  Diagnosis Date   Asthma    Cancer (HCC) 2017   colon   chemo tx   COPD (chronic obstructive pulmonary disease) (HCC)    wears 3L home O2   Ileus following gastrointestinal surgery (HCC) 09/12/2017   required TPN support   Lung cancer (HCC) dx'd 10/2016   Pleural effusion, right    Shortness of breath    with exertion   Past Surgical History:  Procedure Laterality Date   ABDOMINAL HYSTERECTOMY     APPENDECTOMY     BREAST SURGERY     Left cyst removal   CATARACT EXTRACTION W/ INTRAOCULAR LENS IMPLANT     Right   CATARACT EXTRACTION W/PHACO  01/02/2013   Procedure: CATARACT EXTRACTION PHACO AND INTRAOCULAR LENS PLACEMENT (IOC);  Surgeon: Gemma Payor, MD;  Location: [facility];  Service: Ophthalmology;  Laterality: Left;  CDE: 16.19   COLON SURGERY     DILATION AND CURETTAGE OF UTERUS     EYE SURGERY     IR IMAGING GUIDED PORT INSERTION  05/31/2018   IR THORACENTESIS ASP PLEURAL SPACE W/IMG GUIDE  02/19/2018   LAPAROSCOPIC APPENDECTOMY  09/12/2017   laparoscopic hemicolectomy Right 12/07/2017   DR. Stitzenberg and [facility]   LUNG BIOPSY     LYMPH NODE BIOPSY N/A 02/04/2018   Procedure: LYMPH NODE BIOPSY;  Surgeon: Alleen Borne, MD;  Location: [facility];  Service: Thoracic;  Laterality: N/A;   MEDIASTINOSCOPY N/A 02/04/2018   Procedure: MEDIASTINOSCOPY;  Surgeon: Alleen Borne, MD;  Location: [facility];  Service: Thoracic;   Laterality: N/A;   ORIF HUMERUS FRACTURE Left 10/11/2022   Procedure: OPEN REDUCTION INTERNAL FIXATION OF LEFT DISTAL HUMERUS FRACTURE;  Surgeon: Roby Lofts, MD;  Location: [facility];  Service: Orthopedics;  Laterality: Left;   PLEURAL EFFUSION DRAINAGE Right 03/01/2018   Procedure: DRAINAGE OF PLEURAL EFFUSION;  Surgeon: Alleen Borne, MD;  Location: [facility];  Service: Thoracic;  Laterality: Right;   VIDEO ASSISTED THORACOSCOPY Right 03/01/2018   Procedure: VIDEO ASSISTED THORACOSCOPY;  Surgeon: Alleen Borne, MD;  Location: [facility];  Service: Thoracic;  Laterality: Right;   Patient Active Problem List   Diagnosis Date Noted   Closed fracture of left distal humerus 10/11/2022   Bronchiectasis (HCC) 10/27/2021   Port-A-Cath in place 07/04/2018   S/P thoracotomy 03/01/2018   Chronic respiratory failure with hypoxia (HCC) 02/18/2018   Pleural effusion 02/18/2018   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  THERAPY DIAG:  Pain in left elbow  Stiffness of left elbow, not elsewhere  classified  Rationale for Evaluation and Treatment: Rehabilitation  ONSET DATE: 10/12/23 (surgery date).  SUBJECTIVE:                                                                                                                                                                                      SUBJECTIVE STATEMENT: No new complaints.    PERTINENT HISTORY: COPD, portable 02 at 2L/min, "in remission" "cancer-free for a year and a half."  PAIN:  Are you having pain? Yes: NPRS scale: 2/10 Pain location: Left forearm, extensor surface. Pain description: Sore. Aggravating factors: Pronation and supination of left forearm. Relieving factors: "Not turning  forearm."  PRECAUTIONS: None  WEIGHT BEARING RESTRICTIONS: No  FALLS:  Has patient fallen in last 6 months? No  LIVING ENVIRONMENT: Lives with: lives with their spouse Lives in: House/apartment Has following equipment at home: None  OCCUPATION: Retired.  PLOF: Independent with basic ADLs  PATIENT GOALS: Decrease pain with forearm movement.    OBJECTIVE:   PATIENT SURVEYS :  FOTO      UPPER EXTREMITY ROM:   Active left shoulder antigravity flexion is 140 degrees, full ER and behind back movement.  Left active elbow extension is -15 degrees and flexion to 125 degrees, full left forearm active pronation and supination is 70 degrees.  UPPER EXTREMITY MMT:  Left deltoid and RTC strength is 4+/5.  Left elbow strength is 4 to 4+/5.   PALPATION:  Tender to palpation over proximal 1/3 of left extensor musculature region.   TODAY'S TREATMENT:                                                                                                                                         DATE:   STW/M x 23 minutes to patient's left elbow extensor musculature with ischemic release technique utilized f/b HMP x 8 minutes.    PATIENT EDUCATION:  HOME EXERCISE PROGRAM:  ASSESSMENT:  CLINICAL IMPRESSION: The patient did very well with STW/M with good response.  Trigger point/ischemic release technique utilized.  OBJECTIVE IMPAIRMENTS: decreased activity  tolerance, decreased ROM, decreased strength, increased muscle spasms, and pain.   ACTIVITY LIMITATIONS: carrying  PARTICIPATION LIMITATIONS: laundry  PERSONAL FACTORS: Time since onset of injury/illness/exacerbation and 1 comorbidity: left distal humeral fracture  are also affecting patient's functional outcome.   REHAB POTENTIAL: Excellent  CLINICAL DECISION MAKING: Stable/uncomplicated  EVALUATION COMPLEXITY: Low  GOALS:  SHORT TERM GOALS: Target date: 07/24/23  Ind with a HEP. Goal status: INITIAL   LONG TERM GOALS:  Target date: 08/14/23  5/5 left UE strength grade.  Goal status: INITIAL  2.  Perform ADL's with pain not > 2/10.  Goal status: INITIAL  3.  Perform left active forearm movement with pain not > 2/10.  Goal status: INITIAL  PLAN: PT FREQUENCY:  1-2 times  PT DURATION: 6 weeks  PLANNED INTERVENTIONS: Therapeutic exercises, Therapeutic activity, Patient/Family education, Self Care, Dry Needling, Cryotherapy, Moist heat, and Manual therapy  PLAN FOR NEXT SESSION: Pulleys, RW4 beginning with yellow theraband, left elbow PRE, STW/M to left forearm extensor musculature.   , [place], PT 07/10/2023, 3:14 PM

## 2023-07-17 ENCOUNTER — Ambulatory Visit: Payer: Medicare Other | Admitting: Physical Therapy

## 2023-07-17 DIAGNOSIS — M25622 Stiffness of left elbow, not elsewhere classified: Secondary | ICD-10-CM

## 2023-07-17 DIAGNOSIS — M25522 Pain in left elbow: Secondary | ICD-10-CM

## 2023-07-17 NOTE — Therapy (Signed)
OUTPATIENT PHYSICAL THERAPY UPPER EXTREMITY EVALUATION   Patient Name: Melissa Golden MRN: [id number] DOB:Feb 21, 1946, 77 y.o., female Today's Date: 07/17/2023  END OF SESSION:  PT End of Session - 07/17/23 1508     Visit Number 3    Number of Visits 12    Date for PT Re-Evaluation 10/01/23    Authorization Type FOTO.    PT Start Time 0239    PT Stop Time 0314    PT Time Calculation (min) 35 min    Activity Tolerance Patient tolerated treatment well    Behavior During Therapy [facility] for tasks assessed/performed             Past Medical History:  Diagnosis Date   Asthma    Cancer (HCC) 2017   colon   chemo tx   COPD (chronic obstructive pulmonary disease) (HCC)    wears 3L home O2   Ileus following gastrointestinal surgery (HCC) 09/12/2017   required TPN support   Lung cancer (HCC) dx'd 10/2016   Pleural effusion, right    Shortness of breath    with exertion   Past Surgical History:  Procedure Laterality Date   ABDOMINAL HYSTERECTOMY     APPENDECTOMY     BREAST SURGERY     Left cyst removal   CATARACT EXTRACTION W/ INTRAOCULAR LENS IMPLANT     Right   CATARACT EXTRACTION W/PHACO  01/02/2013   Procedure: CATARACT EXTRACTION PHACO AND INTRAOCULAR LENS PLACEMENT (IOC);  Surgeon: Gemma Payor, MD;  Location: [facility];  Service: Ophthalmology;  Laterality: Left;  CDE: 16.19   COLON SURGERY     DILATION AND CURETTAGE OF UTERUS     EYE SURGERY     IR IMAGING GUIDED PORT INSERTION  05/31/2018   IR THORACENTESIS ASP PLEURAL SPACE W/IMG GUIDE  02/19/2018   LAPAROSCOPIC APPENDECTOMY  09/12/2017   laparoscopic hemicolectomy Right 12/07/2017   DR. Stitzenberg and [facility]   LUNG BIOPSY     LYMPH NODE BIOPSY N/A 02/04/2018   Procedure: LYMPH NODE BIOPSY;  Surgeon: Alleen Borne, MD;  Location: [facility];  Service: Thoracic;  Laterality: N/A;   MEDIASTINOSCOPY N/A 02/04/2018   Procedure: MEDIASTINOSCOPY;  Surgeon: Alleen Borne, MD;  Location: [facility];  Service: Thoracic;   Laterality: N/A;   ORIF HUMERUS FRACTURE Left 10/11/2022   Procedure: OPEN REDUCTION INTERNAL FIXATION OF LEFT DISTAL HUMERUS FRACTURE;  Surgeon: Roby Lofts, MD;  Location: [facility];  Service: Orthopedics;  Laterality: Left;   PLEURAL EFFUSION DRAINAGE Right 03/01/2018   Procedure: DRAINAGE OF PLEURAL EFFUSION;  Surgeon: Alleen Borne, MD;  [facility]: [facility];  Service: Thoracic;  Laterality: Right;   VIDEO ASSISTED THORACOSCOPY Right 03/01/2018   Procedure: VIDEO ASSISTED THORACOSCOPY;  Surgeon: Alleen Borne, MD;  [facility];  Service: Thoracic;  Laterality: Right;   Patient Active Problem List   Diagnosis Date Noted   Closed fracture of left distal humerus 10/11/2022   Bronchiectasis (HCC) 10/27/2021   Port-A-Cath in place 07/04/2018   S/P thoracotomy 03/01/2018   Chronic respiratory failure with hypoxia (HCC) 02/18/2018   Pleural effusion 02/18/2018   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  THERAPY DIAG:  Pain in left elbow  Stiffness of left elbow, not elsewhere  classified  Rationale for Evaluation and Treatment: Rehabilitation  ONSET DATE: 10/12/23 (surgery date).  SUBJECTIVE:                                                                                                                                                                                      SUBJECTIVE STATEMENT: Last treatment helped.  PERTINENT HISTORY: COPD, portable 02 at 2L/min, "in remission" "cancer-free for a year and a half."  PAIN:  Are you having pain? Yes: NPRS scale: 2/10 Pain location: Left forearm, extensor surface. Pain description: Sore. Aggravating factors: Pronation and supination of left forearm. Relieving factors: "Not  turning forearm."  PRECAUTIONS: None  WEIGHT BEARING RESTRICTIONS: No  FALLS:  Has patient fallen in last 6 months? No  LIVING ENVIRONMENT: Lives with: lives with their spouse Lives in: House/apartment Has following equipment at home: None  OCCUPATION: Retired.  PLOF: Independent with basic ADLs  PATIENT GOALS: Decrease pain with forearm movement.    OBJECTIVE:   PATIENT SURVEYS :  FOTO      UPPER EXTREMITY ROM:   Active left shoulder antigravity flexion is 140 degrees, full ER and behind back movement.  Left active elbow extension is -15 degrees and flexion to 125 degrees, full left forearm active pronation and supination is 70 degrees.  UPPER EXTREMITY MMT:  Left deltoid and RTC strength is 4+/5.  Left elbow strength is 4 to 4+/5.   PALPATION:  Tender to palpation over proximal 1/3 of left extensor musculature region.   TODAY'S TREATMENT:                                                                                                                                         DATE:   STW/M x 23 minutes to patient's left elbow extensor musculature with ischemic release technique utilized f/b HMP x 8 minutes.    PATIENT EDUCATION:  HOME EXERCISE PROGRAM:  ASSESSMENT:  CLINICAL IMPRESSION: The patient did very well with STW/M with good response and less pain with active left forearm motion.   OBJECTIVE IMPAIRMENTS: decreased  activity tolerance, decreased ROM, decreased strength, increased muscle spasms, and pain.   ACTIVITY LIMITATIONS: carrying  PARTICIPATION LIMITATIONS: laundry  PERSONAL FACTORS: Time since onset of injury/illness/exacerbation and 1 comorbidity: left distal humeral fracture  are also affecting patient's functional outcome.   REHAB POTENTIAL: Excellent  CLINICAL DECISION MAKING: Stable/uncomplicated  EVALUATION COMPLEXITY: Low  GOALS:  SHORT TERM GOALS: Target date: 07/24/23  Ind with a HEP. Goal status: INITIAL   LONG TERM  GOALS: Target date: 08/14/23  5/5 left UE strength grade.  Goal status: INITIAL  2.  Perform ADL's with pain not > 2/10.  Goal status: INITIAL  3.  Perform left active forearm movement with pain not > 2/10.  Goal status: INITIAL  PLAN: PT FREQUENCY:  1-2 times  PT DURATION: 6 weeks  PLANNED INTERVENTIONS: Therapeutic exercises, Therapeutic activity, Patient/Family education, Self Care, Dry Needling, Cryotherapy, Moist heat, and Manual therapy  PLAN FOR NEXT SESSION: Pulleys, RW4 beginning with yellow theraband, left elbow PRE, STW/M to left forearm extensor musculature.   Skylynne Schlechter, [place], PT 07/17/2023, 3:14 PM

## 2023-07-24 ENCOUNTER — Ambulatory Visit: Payer: Medicare Other | Admitting: Physical Therapy

## 2023-07-24 DIAGNOSIS — M25622 Stiffness of left elbow, not elsewhere classified: Secondary | ICD-10-CM | POA: Diagnosis not present

## 2023-07-24 DIAGNOSIS — M25522 Pain in left elbow: Secondary | ICD-10-CM

## 2023-07-24 NOTE — Therapy (Signed)
OUTPATIENT PHYSICAL THERAPY UPPER EXTREMITY EVALUATION   Patient Name: Melissa Golden MRN: [id number] DOB:[birth year], 77 y.o., female Today's Date: 07/24/2023  END OF SESSION:  PT End of Session - 07/24/23 1540     Visit Number 4    Number of Visits 12    Date for PT Re-Evaluation 10/01/23    Authorization Type FOTO.    PT Start Time 0230    PT Stop Time 0308    PT Time Calculation (min) 38 min    Activity Tolerance Patient tolerated treatment well    Behavior During Therapy [facility] for tasks assessed/performed             Past Medical History:  Diagnosis Date   Asthma    Cancer (HCC) 2017   colon   chemo tx   COPD (chronic obstructive pulmonary disease) (HCC)    wears 3L home O2   Ileus following gastrointestinal surgery (HCC) 09/12/2017   required TPN support   Lung cancer (HCC) dx'd 10/2016   Pleural effusion, right    Shortness of breath    with exertion   Past Surgical History:  Procedure Laterality Date   ABDOMINAL HYSTERECTOMY     APPENDECTOMY     BREAST SURGERY     Left cyst removal   CATARACT EXTRACTION W/ INTRAOCULAR LENS IMPLANT     Right   CATARACT EXTRACTION W/PHACO  01/02/2013   Procedure: CATARACT EXTRACTION PHACO AND INTRAOCULAR LENS PLACEMENT (IOC);  Surgeon: Gemma Payor, MD;  Location: [facility];  Service: Ophthalmology;  Laterality: Left;  CDE: 16.19   COLON SURGERY     DILATION AND CURETTAGE OF UTERUS     EYE SURGERY     IR IMAGING GUIDED PORT INSERTION  05/31/2018   IR THORACENTESIS ASP PLEURAL SPACE W/IMG GUIDE  02/19/2018   LAPAROSCOPIC APPENDECTOMY  09/12/2017   laparoscopic hemicolectomy Right 12/07/2017   DR. Stitzenberg and [facility]   LUNG BIOPSY     LYMPH NODE BIOPSY N/A 02/04/2018   Procedure: LYMPH NODE BIOPSY;  Surgeon: Alleen Borne, MD;  [facility];  Service: Thoracic;  Laterality: N/A;   MEDIASTINOSCOPY N/A 02/04/2018   Procedure: MEDIASTINOSCOPY;  Surgeon: Alleen Borne, MD;  [facility]: [facility];  Service: Thoracic;   Laterality: N/A;   ORIF HUMERUS FRACTURE Left 10/11/2022   Procedure: OPEN REDUCTION INTERNAL FIXATION OF LEFT DISTAL HUMERUS FRACTURE;  Surgeon: Roby Lofts, MD;  [facility];  Service: Orthopedics;  Laterality: Left;   PLEURAL EFFUSION DRAINAGE Right 03/01/2018   Procedure: DRAINAGE OF PLEURAL EFFUSION;  Surgeon: Alleen Borne, MD;  [facility];  Service: Thoracic;  Laterality: Right;   VIDEO ASSISTED THORACOSCOPY Right 03/01/2018   Procedure: VIDEO ASSISTED THORACOSCOPY;  Surgeon: Alleen Borne, MD;  [facility];  Service: Thoracic;  Laterality: Right;   Patient Active Problem List   Diagnosis Date Noted   Closed fracture of left distal humerus 10/11/2022   Bronchiectasis (HCC) 10/27/2021   Port-A-Cath in place 07/04/2018   S/P thoracotomy 03/01/2018   Chronic respiratory failure with hypoxia (HCC) 02/18/2018   Pleural effusion 02/18/2018   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  THERAPY DIAG:  Pain in left elbow  Stiffness of left elbow, not elsewhere  classified  Rationale for Evaluation and Treatment: Rehabilitation  ONSET DATE: 10/12/23 (surgery date).  SUBJECTIVE:                                                                                                                                                                                      SUBJECTIVE STATEMENT: Doing much better.  Pain low.  PERTINENT HISTORY: COPD, portable 02 at 2L/min, "in remission" "cancer-free for a year and a half."  PAIN:  Are you having pain? Yes: NPRS scale: 1/10 Pain location: Left forearm, extensor surface. Pain description: Sore. Aggravating factors: Pronation and supination of left forearm. Relieving factors: "Not  turning forearm."  PRECAUTIONS: None  WEIGHT BEARING RESTRICTIONS: No  FALLS:  Has patient fallen in last 6 months? No  LIVING ENVIRONMENT: Lives with: lives with their spouse Lives in: House/apartment Has following equipment at home: None  OCCUPATION: Retired.  PLOF: Independent with basic ADLs  PATIENT GOALS: Decrease pain with forearm movement.    OBJECTIVE:   PATIENT SURVEYS :  FOTO      UPPER EXTREMITY ROM:   Active left shoulder antigravity flexion is 140 degrees, full ER and behind back movement.  Left active elbow extension is -15 degrees and flexion to 125 degrees, full left forearm active pronation and supination is 70 degrees.  UPPER EXTREMITY MMT:  Left deltoid and RTC strength is 4+/5.  Left elbow strength is 4 to 4+/5.   PALPATION:  Tender to palpation over proximal 1/3 of left extensor musculature region.   TODAY'S TREATMENT:                                                                                                                                         DATE:   STW/M x 23 minutes to patient's left elbow extensor musculature with ischemic release technique utilized f/b HMP x 10 minutes.    PATIENT EDUCATION:  HOME EXERCISE PROGRAM:  ASSESSMENT:  CLINICAL IMPRESSION: Patient very pleased with her progress.  Very low pain upon presentation to the clinic and no pain complaints after  treatment.    OBJECTIVE IMPAIRMENTS: decreased activity tolerance, decreased ROM, decreased strength, increased muscle spasms, and pain.   ACTIVITY LIMITATIONS: carrying  PARTICIPATION LIMITATIONS: laundry  PERSONAL FACTORS: Time since onset of injury/illness/exacerbation and 1 comorbidity: left distal humeral fracture  are also affecting patient's functional outcome.   REHAB POTENTIAL: Excellent  CLINICAL DECISION MAKING: Stable/uncomplicated  EVALUATION COMPLEXITY: Low  GOALS:  SHORT TERM GOALS: Target date: 07/24/23  Ind with a HEP. Goal status:  INITIAL   LONG TERM GOALS: Target date: 08/14/23  5/5 left UE strength grade.  Goal status: INITIAL  2.  Perform ADL's with pain not > 2/10.  Goal status: INITIAL  3.  Perform left active forearm movement with pain not > 2/10.  Goal status: INITIAL  PLAN: PT FREQUENCY:  1-2 times  PT DURATION: 6 weeks  PLANNED INTERVENTIONS: Therapeutic exercises, Therapeutic activity, Patient/Family education, Self Care, Dry Needling, Cryotherapy, Moist heat, and Manual therapy  PLAN FOR NEXT SESSION: Pulleys, RW4 beginning with yellow theraband, left elbow PRE, STW/M to left forearm extensor musculature.   Zameer Borman, [place], PT 07/24/2023, 3:41 PM

## 2023-07-31 ENCOUNTER — Ambulatory Visit: Payer: Medicare Other | Attending: Student | Admitting: Physical Therapy

## 2023-07-31 DIAGNOSIS — M25622 Stiffness of left elbow, not elsewhere classified: Secondary | ICD-10-CM | POA: Diagnosis not present

## 2023-07-31 DIAGNOSIS — M25522 Pain in left elbow: Secondary | ICD-10-CM | POA: Insufficient documentation

## 2023-07-31 NOTE — Therapy (Addendum)
OUTPATIENT PHYSICAL THERAPY UPPER EXTREMITY EVALUATION   Patient Name: Melissa Golden MRN: [id number] DOB:14-Aug-1946, 77 y.o., female Today's Date: 07/31/2023  END OF SESSION:  PT End of Session - 07/31/23 1504     Visit Number 5    Number of Visits 12    Date for PT Re-Evaluation 10/01/23    Authorization Type FOTO.    PT Start Time 0232    PT Stop Time 0258    PT Time Calculation (min) 26 min    Activity Tolerance Patient tolerated treatment well    Behavior During Therapy [facility] for tasks assessed/performed             Past Medical History:  Diagnosis Date   Asthma    Cancer (HCC) 2017   colon   chemo tx   COPD (chronic obstructive pulmonary disease) (HCC)    wears 3L home O2   Ileus following gastrointestinal surgery (HCC) 09/12/2017   required TPN support   Lung cancer (HCC) dx'd 10/2016   Pleural effusion, right    Shortness of breath    with exertion   Past Surgical History:  Procedure Laterality Date   ABDOMINAL HYSTERECTOMY     APPENDECTOMY     BREAST SURGERY     Left cyst removal   CATARACT EXTRACTION W/ INTRAOCULAR LENS IMPLANT     Right   CATARACT EXTRACTION W/PHACO  01/02/2013   Procedure: CATARACT EXTRACTION PHACO AND INTRAOCULAR LENS PLACEMENT (IOC);  Surgeon: Gemma Payor, MD;  Location: [facility];  Service: Ophthalmology;  Laterality: Left;  CDE: 16.19   COLON SURGERY     DILATION AND CURETTAGE OF UTERUS     EYE SURGERY     IR IMAGING GUIDED PORT INSERTION  05/31/2018   IR THORACENTESIS ASP PLEURAL SPACE W/IMG GUIDE  02/19/2018   LAPAROSCOPIC APPENDECTOMY  09/12/2017   laparoscopic hemicolectomy Right 12/07/2017   DR. Stitzenberg and [facility]   LUNG BIOPSY     LYMPH NODE BIOPSY N/A 02/04/2018   Procedure: LYMPH NODE BIOPSY;  Surgeon: Alleen Borne, MD;  [facility]: [facility];  Service: Thoracic;  Laterality: N/A;   MEDIASTINOSCOPY N/A 02/04/2018   Procedure: MEDIASTINOSCOPY;  Surgeon: Alleen Borne, MD;  [facility]: [facility];  Service: Thoracic;   Laterality: N/A;   ORIF HUMERUS FRACTURE Left 10/11/2022   Procedure: OPEN REDUCTION INTERNAL FIXATION OF LEFT DISTAL HUMERUS FRACTURE;  Surgeon: Roby Lofts, MD;  [facility];  Service: Orthopedics;  Laterality: Left;   PLEURAL EFFUSION DRAINAGE Right 03/01/2018   Procedure: DRAINAGE OF PLEURAL EFFUSION;  Surgeon: Alleen Borne, MD;  [facility];  Service: Thoracic;  Laterality: Right;   VIDEO ASSISTED THORACOSCOPY Right 03/01/2018   Procedure: VIDEO ASSISTED THORACOSCOPY;  Surgeon: Alleen Borne, MD;  [facility]: [facility];  Service: Thoracic;  Laterality: Right;   Patient Active Problem List   Diagnosis Date Noted   Closed fracture of left distal humerus 10/11/2022   Bronchiectasis (HCC) 10/27/2021   Port-A-Cath in place 07/04/2018   S/P thoracotomy 03/01/2018   Chronic respiratory failure with hypoxia (HCC) 02/18/2018   Pleural effusion 02/18/2018   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  THERAPY DIAG:  Pain in left elbow  Stiffness of left elbow, not elsewhere  classified  Rationale for Evaluation and Treatment: Rehabilitation  ONSET DATE: 10/12/23 (surgery date).  SUBJECTIVE:                                                                                                                                                                                      SUBJECTIVE STATEMENT: Patient requesting to leave to see her visiting daughter from out of town.  Arm is feeling great.  No pain.  PERTINENT HISTORY: COPD, portable 02 at 2L/min, "in remission" "cancer-free for a year and a half."  PAIN:  Are you having pain? Yes: NPRS scale: 0/10 Pain location: Left forearm, extensor surface. Pain description:  Sore. Aggravating factors: Pronation and supination of left forearm. Relieving factors: "Not turning forearm."  PRECAUTIONS: None  WEIGHT BEARING RESTRICTIONS: No  FALLS:  Has patient fallen in last 6 months? No  LIVING ENVIRONMENT: Lives with: lives with their spouse Lives in: House/apartment Has following equipment at home: None  OCCUPATION: Retired.  PLOF: Independent with basic ADLs  PATIENT GOALS: Decrease pain with forearm movement.    OBJECTIVE:   PATIENT SURVEYS :  FOTO      UPPER EXTREMITY ROM:   Active left shoulder antigravity flexion is 140 degrees, full ER and behind back movement.  Left active elbow extension is -15 degrees and flexion to 125 degrees, full left forearm active pronation and supination is 70 degrees.  UPPER EXTREMITY MMT:  Left deltoid and RTC strength is 4+/5.  Left elbow strength is 4 to 4+/5.   PALPATION:  Tender to palpation over proximal 1/3 of left extensor musculature region.   TODAY'S TREATMENT:                                                                                                                                         DATE: 07/31/23:  Patient has a 3# dumbbell and has been doing left wrist extension and flexion, bicep curls and forearm supination and pronation.  She was provided with red theraband to perform these exercise as an alternative to the 3# dumbbell.  STW/M x 8 minutes to patient's left elbow extensor musculature with ischemic release technique utilized f/b HMP x 8 minutes.    PATIENT EDUCATION:  HOME EXERCISE PROGRAM:  ASSESSMENT:  CLINICAL IMPRESSION: Patient very pleased with her progress.  Very low pain upon presentation to the clinic and no pain complaints after treatment.    OBJECTIVE IMPAIRMENTS: decreased activity tolerance, decreased ROM, decreased strength, increased muscle spasms, and pain.   ACTIVITY LIMITATIONS: carrying  PARTICIPATION LIMITATIONS: laundry  PERSONAL FACTORS: Time since  onset of injury/illness/exacerbation and 1 comorbidity: left distal humeral fracture  are also affecting patient's functional outcome.   REHAB POTENTIAL: Excellent  CLINICAL DECISION MAKING: Stable/uncomplicated  EVALUATION COMPLEXITY: Low  GOALS:  SHORT TERM GOALS: Target date: 07/24/23  Ind with a HEP. Goal status: INITIAL   LONG TERM GOALS: Target date: 08/14/23  5/5 left UE strength grade.  Goal status: MET 2.  Perform ADL's with pain not > 2/10.  Goal status: MET 3.  Perform left active forearm movement with pain not > 2/10.  Goal status: MET PLAN: PT FREQUENCY:  1-2 times  PT DURATION: 6 weeks  PLANNED INTERVENTIONS: Therapeutic exercises, Therapeutic activity, Patient/Family education, Self Care, Dry Needling, Cryotherapy, Moist heat, and Manual therapy  PLAN FOR NEXT SESSION: Pulleys, RW4 beginning with yellow theraband, left elbow PRE, STW/M to left forearm extensor musculature.   Aylissa Heinemann, [place], PT 07/31/2023, 3:15 PM

## 2023-08-07 ENCOUNTER — Ambulatory Visit: Payer: Medicare Other

## 2023-08-14 ENCOUNTER — Ambulatory Visit: Payer: Medicare Other | Admitting: Physical Therapy

## 2023-08-17 ENCOUNTER — Ambulatory Visit (HOSPITAL_BASED_OUTPATIENT_CLINIC_OR_DEPARTMENT_OTHER)
Admission: RE | Admit: 2023-08-17 | Discharge: 2023-08-17 | Disposition: A | Discharge status: Home / Self Care | Payer: Medicare Other | Source: Ambulatory Visit | Attending: Oncology | Admitting: Oncology

## 2023-08-17 DIAGNOSIS — J90 Pleural effusion, not elsewhere classified: Secondary | ICD-10-CM | POA: Diagnosis not present

## 2023-08-17 DIAGNOSIS — C3490 Malignant neoplasm of unspecified part of unspecified bronchus or lung: Secondary | ICD-10-CM | POA: Diagnosis not present

## 2023-08-17 DIAGNOSIS — C3411 Malignant neoplasm of upper lobe, right bronchus or lung: Secondary | ICD-10-CM | POA: Diagnosis not present

## 2023-08-17 DIAGNOSIS — J432 Centrilobular emphysema: Secondary | ICD-10-CM | POA: Diagnosis not present

## 2023-08-17 DIAGNOSIS — R0602 Shortness of breath: Secondary | ICD-10-CM | POA: Diagnosis not present

## 2023-08-23 ENCOUNTER — Inpatient Hospital Stay: Payer: Medicare Other

## 2023-08-23 ENCOUNTER — Inpatient Hospital Stay: Payer: Medicare Other | Attending: Oncology | Admitting: Oncology

## 2023-08-23 ENCOUNTER — Other Ambulatory Visit (HOSPITAL_BASED_OUTPATIENT_CLINIC_OR_DEPARTMENT_OTHER): Payer: Self-pay

## 2023-08-23 ENCOUNTER — Encounter: Payer: Self-pay | Admitting: Oncology

## 2023-08-23 VITALS — BP 134/87 | HR 92 | Temp 98.2°F | Resp 18 | Ht 62.0 in | Wt 123.8 lb

## 2023-08-23 DIAGNOSIS — Z95828 Presence of other vascular implants and grafts: Secondary | ICD-10-CM

## 2023-08-23 DIAGNOSIS — Z85118 Personal history of other malignant neoplasm of bronchus and lung: Secondary | ICD-10-CM | POA: Diagnosis not present

## 2023-08-23 DIAGNOSIS — C3411 Malignant neoplasm of upper lobe, right bronchus or lung: Secondary | ICD-10-CM | POA: Diagnosis not present

## 2023-08-23 DIAGNOSIS — Z23 Encounter for immunization: Secondary | ICD-10-CM | POA: Diagnosis not present

## 2023-08-23 MED ORDER — FLUAD 0.5 ML IM SUSY
PREFILLED_SYRINGE | INTRAMUSCULAR | 0 refills | Status: DC
  Filled 2023-08-23: qty 0.5, 1d supply, fill #0

## 2023-08-23 MED ORDER — SODIUM CHLORIDE 0.9% FLUSH
10.0000 mL | Freq: Once | INTRAVENOUS | Status: AC
  Administered 2023-08-23: 10 mL

## 2023-08-23 MED ORDER — HEPARIN SOD (PORK) LOCK FLUSH 100 UNIT/ML IV SOLN
500.0000 [IU] | Freq: Once | INTRAVENOUS | Status: AC
  Administered 2023-08-23: 500 [IU]

## 2023-08-23 MED ORDER — COMIRNATY 30 MCG/0.3ML IM SUSY
PREFILLED_SYRINGE | INTRAMUSCULAR | 0 refills | Status: DC
  Filled 2023-08-23: qty 0.3, 1d supply, fill #0

## 2023-08-23 NOTE — Patient Instructions (Signed)

## 2023-08-23 NOTE — Progress Notes (Signed)
Melissa Golden OFFICE PROGRESS NOTE   Diagnosis: Non-small cell lung cancer  INTERVAL HISTORY:   Melissa Golden returns as scheduled.  She generally feels well.  Good appetite.  No new complaint.  Vertigo has improved.  Objective:  Vital signs in last 24 hours:  Blood pressure 134/87, pulse 92, temperature 98.2 F (36.8 C), temperature source Oral, resp. rate 18, height 5\' 2"  (1.575 m), weight 123 lb 12.8 oz (56.2 kg), SpO2 96%.    HEENT: Neck without mass Lymphatics: No cervical, supraclavicular, axillary, or inguinal nodes Resp: Lungs clear bilaterally, no respiratory distress Cardio: Regular rate and rhythm GI: No hepatosplenomegaly Vascular: No leg edema   Lab Results:  Lab Results  Component Value Date   WBC 4.7 10/12/2022   HGB 7.5 (L) 10/12/2022   HCT 23.2 (L) 10/12/2022   MCV 97.1 10/12/2022   PLT 165 10/12/2022   NEUTROABS 3.5 07/19/2021    CMP  Lab Results  Component Value Date   NA 142 10/12/2022   K 3.5 10/12/2022   CL 99 10/12/2022   CO2 32 10/12/2022   GLUCOSE 93 10/12/2022   BUN 12 10/12/2022   CREATININE 0.64 10/12/2022   CALCIUM 8.3 (L) 10/12/2022   PROT 6.9 07/19/2021   ALBUMIN 3.8 07/19/2021   AST 16 07/19/2021   ALT 10 07/19/2021   ALKPHOS 63 07/19/2021   BILITOT 0.5 07/19/2021   GFRNONAA >60 10/12/2022   GFRAA >60 06/17/2020    Lab Results  Component Value Date   CEA1 2.44 04/19/2020     Medications: I have reviewed the patient's current medications.   Assessment/Plan: Adenocarcinoma/goblet cell carcinoid of the appendix CT scan of the abdomen/pelvis on 09/12/2017 - fluid-filled hypervascular appendix with mild reactive enteritis within the ileum.   Status post laparoscopic appendectomy 09/12/2017.  Postoperative course complicated by an ileus requiring TPN.   Final pathology of the appendix-5 cm adenocarcinoma/goblet cell carcinoid.  The tumor extended through the muscularis propria into subserosal soft tissue and  mesoappendix and focally to the serosal surface.  Lymphovascular space invasion identified.  Perineural invasion identified.  Associated acute appendicitis with perforation. 10/15/2017 CEA 1. 11/22/2017 chromogranin A-3 Status post laparoscopic right hemicolectomy by Dr. Glennis Brink at [facility] on 12/07/2017.  No carcinomatosis or liver metastases were seen.   Final pathology showed metastatic adenocarcinoma/goblet cell carcinoid in 3 of 15 pericolic lymph nodes; maximum size of largest metastasis 0.2 cm; positive for extracapsular extension.  Benign terminal ileum with fibrous serosal adhesions, negative for dysplasia or carcinoma; surgically absent appendix; appendectomy site with postsurgical changes and fibrous serosal adhesions, negative for carcinoma; proximal and distal mucosal margins negative for dysplasia or carcinoma; mesenteric resection margin negative for carcinoma. Cycle 1 adjuvant Xeloda 01/07/2018 Cycle 2 adjuvant Xeloda 01/28/2018 Non-small cell lung cancer PET scan 10/17/2017-3 hypermetabolic right upper lobe pulmonary nodules increased in size and metabolic activity.  No mediastinal nodal metastasis.  4 mm right upper lobe nodule with metabolic activity, SUV 3.6.  A third right upper lobe nodule measures 4 mm and has faint metabolic activity.  No clear evidence of malignancy in the abdomen or pelvis.  Mild activity along the ventral abdominal surgical scar.  Scattered activity in small bowel without focal lesion on CT. CT biopsy right upper lobe nodule 11/02/2017 showed non-small cell carcinoma; malignant cells positive for TTF-1 and negative for p63.  PD-L1 95%. Chest CT 01/21/2018-new mediastinal adenopathy seen in the prevascular and right paratracheal stations.  Largest lymph node is seen in the prevascular space  measuring 2.0 cm, previously 6 mm.  No hilar or axillary adenopathy.  Right lung nodules are stable. Biopsy of a right paratracheal lymph node on 02/04/2018-metastatic adenocarcinoma  consistent with a lung primary Chest radiation started 03/11/2018 Week 1 Taxol/carboplatin 03/13/2018 Week 2 Taxol/carboplatin 03/20/2018 Week 3 Taxol/carboplatin 03/27/2018 Week  4Taxol/carboplatin 04/03/2018 Week 5 Taxol/carboplatin 04/10/2018 Week 6 Taxol/carboplatin 04/19/2018 Chest radiation completed 04/25/2018 CT chest 05/23/2018-interval decrease in size of mediastinal adenopathy.  Near complete resolution of previously visualized right pleural effusion.  The right lung nodules have decreased in size.  Other nodules are stable. Cycle 1 durvalumab 05/23/2018 Cycle 2 durvalumab 06/06/2018 Cycle 3 durvalumab 06/20/2018 Cycle 4 durvalumab 07/04/2018 Cycle 5 durvalumab 07/19/2018 Cycle 6 durvalumab 08/01/2018 Cycle 7 durvalumab 08/15/2018 CT chest 09/03/2018- collapse/consolidation with bronchiectasis and surrounding groundglass involving primarily the right upper lobe, findings most indicative of recent radiation therapy. Previously measured right upper lobe nodule obscured.  New areas of nodularity in the right lower lobe, likely infectious or inflammatory. Cycle 8 durvalumab 09/04/2018 Cycle 9 durvalumab 09/18/2018 Cycle 10 durvalumab 10/02/2018 Cycle 11 durvalumab 10/16/2018  Cycle 12 durvalumab 11/13/2018 Cycle 13 durvalumab 11/30/2018 Cycle 14 durvalumab 12/11/2018 CT chest 12/16/2018 [facility])- no mediastinal, axillary or hilar adenopathy.  Centrilobular emphysema.  Right apical volume loss with bronchiectasis traversing the area of atelectatic lung.  Elevation of the adjacent minor fissure due to volume loss.  Subpleural left upper lobe opacity, nonspecific, measuring 8.7 mm.  Compression fractures T5 and T7. Cycle 15 durvalumab 12/25/2018 Cycle 16 durvalumab 01/29/2019 Cycle 17 durvalumab 02/12/2019 Cycle 18 durvalumab 02/26/2019 Cycle 19 durvalumab 03/12/2019 Cycle 20 durvalumab 03/26/2019 Cycle 21 durvalumab 04/16/2019 Cycle 22 durvalumab 04/30/2019 CT chest 05/12/2019- right lung radiation fibrosis,  no evidence of local tumor recurrence, no evidence of metastatic disease in the chest, stable pulmonary nodules Cycle 23 durvalumab 05/14/2019 CT chest 10/13/2019-stable radiation changes in the right lung, new nodules in the right lower lobe, left lower lobe, and lingula-favored to represent an inflammatory process CT chest 12/30/2019-no findings to suggest recurrent or metastatic disease, subtle groundglass at the site of previous nodularity in the right lung base almost completely resolved CT chest 06/17/2020-6 mm left lower lobe nodule-more conspicuous, stable compression deformities at T5 and T7 CT chest 10/04/2020-nodular density medial left lower lobe unchanged.  Adjacent peribronchovascular groundglass and volume loss have increased slightly and are likely infectious or inflammatory. CT chest 04/28/2021-new interstitial and nodular opacities in the right upper lobe laterally, favoring pneumonia, tumor and lymphangitic spread considered less likely.  Mild increase in loculated right pleural effusion.  Mild increase in atelectasis or nodular scarring peripherally at the left lung base.  Stable compression fractures at T5 and T7. CT chest 07/19/2021-previously described patchy nodular foci of consolidation in the upper right lung and left lung base have resolved compatible with resolved multilobar pneumonia.  Stable radiation fibrosis upper right lung with no evidence of local tumor recurrence.  No findings suspicious for metastatic disease in the chest. CT chest 02/07/2022-interval development of bilateral irregular pulmonary nodules right greater than left. CT chest 06/01/2022-unchanged posttreatment appearance of the right chest, irregular nodules and groundglass opacities in the bilateral lung bases have improved, emphysema, enlargement the distal descending and proximal abdominal aorta CT chest 11/16/2022-stable right perihilar postradiation change, new clustered irregular nodules at the right lower  lobe CT chest 02/08/2023-stable appearance of upper lobe consolidation changes, increase in basilar nodularity, emphysema, osteopenia CT chest 08/17/2023-postradiation scarring in the right hemithorax, no evidence of recurrent disease, tiny loculated right pleural  fluid decreased from 02/08/2023     COPD   4.   VATS drainage of a loculated right pleural effusion 03/01/2018   5.  Port-A-Cath placement 05/31/2018   6.  Severe mid back pain 10/30/2018-CT thoracic spine- T5 compression fracture new since 09/03/2018- appearance consistent with an osteoporotic fracture.  Pain improved.  Increased back pain January 2020.  CT scan 12/16/2018- new compression fractures T5 and T7.  T5 vertebral body somewhat sclerotic in appearance.  MRI thoracic spine 01/08/2019-acute compression fractures involving the T5 and T7 vertebral bodies stable relative to recent CT with up to 50% height loss at T5 and 25% height loss at T7.  No associated bony retropulsion.  Benign/mechanical features by MRI with no appreciable underlying pathologic lesion.  No other evidence for metastatic disease within the thoracic spine.   7.  Positional vertigo 05/17/2023       Disposition: Melissa Golden is in clinical remission from non-small cell lung cancer.  She will return for an office visit and surveillance chest CT in 6 months.  She will obtain an influenza and COVID-19 vaccine today.  I recommend she follow-up with her primary provider to be sure she is up-to-date on pneumonia vaccines.  She would like to keep the Port-A-Cath in place.  The Port-A-Cath will be flushed every 8 weeks.  Thornton Papas, MD  08/23/2023  3:17 PM

## 2023-08-27 DIAGNOSIS — C3411 Malignant neoplasm of upper lobe, right bronchus or lung: Secondary | ICD-10-CM | POA: Diagnosis not present

## 2023-08-27 DIAGNOSIS — J449 Chronic obstructive pulmonary disease, unspecified: Secondary | ICD-10-CM | POA: Diagnosis not present

## 2023-09-28 DIAGNOSIS — R059 Cough, unspecified: Secondary | ICD-10-CM | POA: Diagnosis not present

## 2023-09-28 DIAGNOSIS — Z6822 Body mass index (BMI) 22.0-22.9, adult: Secondary | ICD-10-CM | POA: Diagnosis not present

## 2023-09-28 DIAGNOSIS — J441 Chronic obstructive pulmonary disease with (acute) exacerbation: Secondary | ICD-10-CM | POA: Diagnosis not present

## 2023-10-18 ENCOUNTER — Inpatient Hospital Stay: Payer: Medicare Other | Attending: Oncology

## 2023-10-18 VITALS — BP 143/73 | HR 100 | Temp 98.2°F | Resp 18

## 2023-10-18 DIAGNOSIS — Z85118 Personal history of other malignant neoplasm of bronchus and lung: Secondary | ICD-10-CM | POA: Diagnosis not present

## 2023-10-18 DIAGNOSIS — Z452 Encounter for adjustment and management of vascular access device: Secondary | ICD-10-CM | POA: Insufficient documentation

## 2023-10-18 DIAGNOSIS — C3411 Malignant neoplasm of upper lobe, right bronchus or lung: Secondary | ICD-10-CM

## 2023-10-18 DIAGNOSIS — Z95828 Presence of other vascular implants and grafts: Secondary | ICD-10-CM

## 2023-10-18 MED ORDER — SODIUM CHLORIDE 0.9% FLUSH
10.0000 mL | Freq: Once | INTRAVENOUS | Status: AC
  Administered 2023-10-18: 10 mL

## 2023-10-18 MED ORDER — HEPARIN SOD (PORK) LOCK FLUSH 100 UNIT/ML IV SOLN
500.0000 [IU] | Freq: Once | INTRAVENOUS | Status: AC
  Administered 2023-10-18: 500 [IU]

## 2023-10-18 NOTE — Patient Instructions (Signed)

## 2023-12-12 ENCOUNTER — Inpatient Hospital Stay: Payer: Medicare Other | Attending: Oncology

## 2023-12-12 VITALS — BP 131/72 | HR 87 | Temp 97.7°F | Resp 18 | Ht 62.0 in | Wt 125.0 lb

## 2023-12-12 DIAGNOSIS — Z452 Encounter for adjustment and management of vascular access device: Secondary | ICD-10-CM | POA: Diagnosis not present

## 2023-12-12 DIAGNOSIS — Z95828 Presence of other vascular implants and grafts: Secondary | ICD-10-CM

## 2023-12-12 DIAGNOSIS — C3411 Malignant neoplasm of upper lobe, right bronchus or lung: Secondary | ICD-10-CM

## 2023-12-12 DIAGNOSIS — Z85118 Personal history of other malignant neoplasm of bronchus and lung: Secondary | ICD-10-CM | POA: Diagnosis not present

## 2023-12-12 MED ORDER — HEPARIN SOD (PORK) LOCK FLUSH 100 UNIT/ML IV SOLN
500.0000 [IU] | Freq: Once | INTRAVENOUS | Status: AC
  Administered 2023-12-12: 500 [IU]

## 2023-12-12 MED ORDER — SODIUM CHLORIDE 0.9% FLUSH
10.0000 mL | Freq: Once | INTRAVENOUS | Status: AC
  Administered 2023-12-12: 10 mL

## 2023-12-12 NOTE — Patient Instructions (Signed)

## 2023-12-13 ENCOUNTER — Encounter: Payer: Self-pay | Admitting: Pulmonary Disease

## 2023-12-13 ENCOUNTER — Ambulatory Visit: Payer: Medicare Other | Admitting: Pulmonary Disease

## 2023-12-13 VITALS — BP 122/70 | HR 102 | Temp 98.3°F | Ht 62.0 in | Wt 124.6 lb

## 2023-12-13 DIAGNOSIS — Z85118 Personal history of other malignant neoplasm of bronchus and lung: Secondary | ICD-10-CM | POA: Diagnosis not present

## 2023-12-13 DIAGNOSIS — J449 Chronic obstructive pulmonary disease, unspecified: Secondary | ICD-10-CM | POA: Diagnosis not present

## 2023-12-13 DIAGNOSIS — J9611 Chronic respiratory failure with hypoxia: Secondary | ICD-10-CM | POA: Diagnosis not present

## 2023-12-13 DIAGNOSIS — Z87891 Personal history of nicotine dependence: Secondary | ICD-10-CM

## 2023-12-13 DIAGNOSIS — Z720 Tobacco use: Secondary | ICD-10-CM

## 2023-12-13 DIAGNOSIS — J479 Bronchiectasis, uncomplicated: Secondary | ICD-10-CM | POA: Diagnosis not present

## 2023-12-13 NOTE — Progress Notes (Signed)
Synopsis: Referred in September 2020 for COPD management by Juliette Alcide, MD  Subjective:   PATIENT ID: Melissa Golden GENDER: female DOB: 11-Sep-1946, MRN: [id number]  Chief Complaint  Patient presents with   Follow-up    78 year old female past medical history of non-small cell lung cancer.  Diagnosed in 2018 with 3 hyper medic right upper lobe pulmonary nodules.  Had a CT-guided biopsy of the upper lobe nodule in December 2018 consistent with non-small cell lung cancer, TTF-1 positive p63 negative, PD-L1 95%.  In February 2019 had new mediastinal adenopathy within the prevascular and right paratracheal lymph node.  Biopsy of the paratracheal lymph node revealed metastatic lung primary.  Patient underwent 6 weeks of Taxol carboplatinum followed by durvalumab.  Repeat CT chest in June 2020 with evidence of right lung radiation and stable nodules.  Patient also has a history of adenocarcinoma/goblet cell carcinoid of the appendix.  This was in 2018 as well.  Patient underwent laparoscopic right hemicolectomy at [facility].  Patient underwent adjuvant Xeloda.  Patient is followed here in [facility] by Dr. Truett Perna her oncologist.  Patient was referred to see me for COPD management.  And complaints of chronic congestion.  OV 08/13/2019: First diagnosed with COPD back in 2018. Currently on pulmicort + albuterol. She is using her albuterol 3 times per day. She has a travel neb machine but it doesn't work. No fevers, chills. She has a chronic cough. Occassional brings up sputum. She feels like she wheezes all of the time. She lives in [place]. Lives with her husband. Former smoker, quit in 2017, smoked for 50+ years, 1.5-2ppd.  Patient is on 2 L nasal cannula pulse POC.  OV 09/24/2019: Here today for follow-up regarding lung cancer COPD.Patient last seen in September 2020.  Patient doing well today.  Has been using her new inhaler regimen.  Currently using Symbicort plus Spiriva Respimat and as needed  albuterol.  Her breathing is much improved as where she was before.  She would like to have a nebulizer at home.  She has a older machine but no tubing to connect to it.  We will get her DME supplies for this today.  She is able to keep most of her activities of daily living.  She does get dyspneic with significant exertion.  Denies fever chills night sweats weight loss denies hemoptysis.  OV 10/14/2020: Patient here today for follow-up of COPD.  Please see documented lung cancer and colon cancer history as above.  Overall doing well.  She is on Symbicort plus Spiriva at this time.  She felt like she was doing much better on Trelegy.  But due to insurance she had to switch back.  She would like to try again see if insurance would pay for Trelegy if at all possible.  She did better with this.  From a respiratory standpoint still has ongoing cough.  This is not much different from her previous cough but she seems to be annoyed by nonproductive.  Patient denies hemoptysis weight loss.  She had an recent noncontrasted CT of the chest which revealed stability of lung nodule.  This was reviewed with her and her oncologist recently.  OV 04/27/2021: doing well today. Breathing stable. Doing well with trelegy. Occasional wheezing. No chest tightness.  Patient doing well today overall.  Needs refills of all of her inhaler medications.  She has follow-up with oncology this week as well.  Going on vacation this summer.  OV 10/30/2022: From respiratory standpoint she is  doing okay.  Has been on triple therapy with Trelegy.  She is concerned about cost.  Wondering if there is an alternative.  She uses her oxygen regularly.  Here with her husband today.  OV 04/06/2023: uses trelegy daily.  Uses her oxygen supplementation.  [place] application for [place] in the past.  Has not had a response.  I will have the office follow-up on this.  Still using Trelegy daily at this time.  From respiratory standpoint doing okay.  No recent  exacerbations.  She did have a left arm fracture.  She is recovered from this this past year.  OV 12/13/2023: Here today for follow-up for COPD.  Stable on current respiratory regimen plus albuterol.  Also on oxygen supplementation.  She is not had any exacerbations recently.  From respiratory standpoint doing well.  She is enjoying time baking.     Past Medical History:  Diagnosis Date   Asthma    Cancer (HCC) 2017   colon   chemo tx   COPD (chronic obstructive pulmonary disease) (HCC)    wears 3L home O2   Ileus following gastrointestinal surgery (HCC) 09/12/2017   required TPN support   Lung cancer (HCC) dx'd 10/2016   Pleural effusion, right    Shortness of breath    with exertion     Family History  Problem Relation Age of Onset   Cancer Mother    Heart attack Father    Cancer - Colon Sister      Past Surgical History:  Procedure Laterality Date   ABDOMINAL HYSTERECTOMY     APPENDECTOMY     BREAST SURGERY     Left cyst removal   CATARACT EXTRACTION W/ INTRAOCULAR LENS IMPLANT     Right   CATARACT EXTRACTION W/PHACO  01/02/2013   Procedure: CATARACT EXTRACTION PHACO AND INTRAOCULAR LENS PLACEMENT (IOC);  Surgeon: Gemma Payor, MD;  Location: [facility];  Service: Ophthalmology;  Laterality: Left;  CDE: 16.19   COLON SURGERY     DILATION AND CURETTAGE OF UTERUS     EYE SURGERY     IR IMAGING GUIDED PORT INSERTION  05/31/2018   IR THORACENTESIS ASP PLEURAL SPACE W/IMG GUIDE  02/19/2018   LAPAROSCOPIC APPENDECTOMY  09/12/2017   laparoscopic hemicolectomy Right 12/07/2017   DR. Stitzenberg and [facility]   LUNG BIOPSY     LYMPH NODE BIOPSY N/A 02/04/2018   Procedure: LYMPH NODE BIOPSY;  Surgeon: Alleen Borne, MD;  Location: [facility];  Service: Thoracic;  Laterality: N/A;   MEDIASTINOSCOPY N/A 02/04/2018   Procedure: MEDIASTINOSCOPY;  Surgeon: Alleen Borne, MD;  Location: [facility];  Service: Thoracic;  Laterality: N/A;   ORIF HUMERUS FRACTURE Left 10/11/2022    Procedure: OPEN REDUCTION INTERNAL FIXATION OF LEFT DISTAL HUMERUS FRACTURE;  Surgeon: Roby Lofts, MD;  Location: [facility];  Service: Orthopedics;  Laterality: Left;   PLEURAL EFFUSION DRAINAGE Right 03/01/2018   Procedure: DRAINAGE OF PLEURAL EFFUSION;  Surgeon: Alleen Borne, MD;  Location: [facility];  Service: Thoracic;  Laterality: Right;   VIDEO ASSISTED THORACOSCOPY Right 03/01/2018   Procedure: VIDEO ASSISTED THORACOSCOPY;  Surgeon: Alleen Borne, MD;  Location: [facility];  Service: Thoracic;  Laterality: Right;    Social History   Socioeconomic History   Marital status: Married    Spouse name: Not on file   Number of children: Not on file   Years of education: Not on file   Highest education level: Not on file  Occupational  History   Occupation: retired  Tobacco Use   Smoking status: Former    Current packs/day: 0.00    Average packs/day: 1.5 packs/day for 68.0 years (102.0 ttl pk-yrs)    Types: Cigarettes    Start date: 10/1948    Quit date: 10/2016    Years since quitting: 7.1   Smokeless tobacco: Never  Vaping Use   Vaping status: Never Used  Substance and Sexual Activity   Alcohol use: Not Currently    Alcohol/week: 6.0 standard drinks of alcohol    Types: 6 Cans of beer per week   Drug use: No   Sexual activity: Not on file  Other Topics Concern   Not on file  Social History Narrative   Not on file   Social Drivers of Health   Financial Resource Strain: Low Risk  (05/04/2022)   Received from [facility], [facility]   Overall Financial Resource Strain (CARDIA)    Difficulty of Paying Living Expenses: Not hard at all  Food Insecurity: No Food Insecurity (10/11/2022)   Hunger Vital Sign    Worried About Running Out of Food in the Last Year: Never true    Ran Out of Food in the Last Year: Never true  Transportation Needs: No Transportation Needs (10/11/2022)   PRAPARE - Administrator, Civil Service (Medical): No    Lack of Transportation  (Non-Medical): No  Physical Activity: Inactive (05/04/2022)   Received from [facility], [facility]   Exercise Vital Sign    Days of Exercise per Week: 0 days    Minutes of Exercise per Session: 0 min  Stress: No Stress Concern Present (05/04/2022)   Received from [facility], [facility] - Occupational Stress Questionnaire    Feeling of Stress : Not at all  Social Connections: Moderately Isolated (05/04/2022)   Received from [facility], [facility]   Social Connection and Isolation Panel [NHANES]    Frequency of Communication with Friends and Family: More than three times a week    Frequency of Social Gatherings with Friends and Family: Three times a week    Attends Religious Services: Never    Active Member of Clubs or Organizations: No    Attends Banker Meetings: Never    Marital Status: Married  Catering manager Violence: Not At Risk (10/11/2022)   Humiliation, Afraid, Rape, and Kick questionnaire    Fear of Current or Ex-Partner: No    Emotionally Abused: No    Physically Abused: No    Sexually Abused: No     No Known Allergies   Outpatient Medications Prior to Visit  Medication Sig Dispense Refill   acetaminophen (TYLENOL) 500 MG tablet Take 2 tablets (1,000 mg total) by mouth every 6 (six) hours. 30 tablet 0   albuterol (PROVENTIL) (2.5 MG/3ML) 0.083% nebulizer solution Take 3 mLs (2.5 mg total) by nebulization every 4 (four) hours as needed for wheezing or shortness of breath. 75 mL 11   Ascorbic Acid (VITAMIN C) 100 MG tablet Take 100 mg by mouth 2 (two) times daily with a meal.     Budeson-Glycopyrrol-Formoterol (BREZTRI AEROSPHERE) 160-9-4.8 MCG/ACT AERO Inhale 2 puffs into the lungs 2 (two) times daily. Receives from [employer]     Coenzyme Q10 (COQ-10 PO) Take 1 tablet by mouth daily.     COVID-19 mRNA vaccine, Pfizer, (COMIRNATY) syringe Inject into the muscle. 0.3 mL 0  influenza vaccine  adjuvanted (FLUAD) 0.5 ML injection Inject into the muscle. 0.5 mL 0   lidocaine-prilocaine (EMLA) cream Apply to portacath 30 min to 1 hour prior to flush or infusions 30 g 1   montelukast (SINGULAIR) 10 MG tablet Take 1 tablet (10 mg total) by mouth at bedtime. 90 tablet 5   Multiple Vitamin (MULTIVITAMIN WITH MINERALS) TABS Take 1 tablet by mouth daily.     OXYGEN Inhale 2 L into the lungs continuous. For COPD     VENTOLIN HFA 108 (90 Base) MCG/ACT inhaler Inhale 1-2 puffs into the lungs every 4 (four) hours as needed for wheezing or shortness of breath. 18 g 11   oxyCODONE (OXY IR/ROXICODONE) 5 MG immediate release tablet Take 1 tablet (5 mg total) by mouth every 4 (four) hours as needed for severe pain. (Patient not taking: Reported on 12/13/2023) 42 tablet 0   No facility-administered medications prior to visit.    Review of Systems  Constitutional:  Negative for chills, fever, malaise/fatigue and weight loss.  HENT:  Negative for hearing loss, sore throat and tinnitus.   Eyes:  Negative for blurred vision and double vision.  Respiratory:  Positive for cough, sputum production and shortness of breath. Negative for hemoptysis, wheezing and stridor.   Cardiovascular:  Negative for chest pain, palpitations, orthopnea, leg swelling and PND.  Gastrointestinal:  Negative for abdominal pain, constipation, diarrhea, heartburn, nausea and vomiting.  Genitourinary:  Negative for dysuria, hematuria and urgency.  Musculoskeletal:  Negative for joint pain and myalgias.  Skin:  Negative for itching and rash.  Neurological:  Negative for dizziness, tingling, weakness and headaches.  Endo/Heme/Allergies:  Negative for environmental allergies. Does not bruise/bleed easily.  Psychiatric/Behavioral:  Negative for depression. The patient is not nervous/anxious and does not have insomnia.   All other systems reviewed and are negative.    Objective:  Physical Exam Vitals reviewed.  Constitutional:       General: She is not in acute distress.    Appearance: She is well-developed.  HENT:     Head: Normocephalic and atraumatic.     Mouth/Throat:     Pharynx: No oropharyngeal exudate.  Eyes:     Conjunctiva/sclera: Conjunctivae normal.     Pupils: Pupils are equal, round, and reactive to light.  Neck:     Vascular: No JVD.     Trachea: No tracheal deviation.     Comments: Loss of supraclavicular fat Cardiovascular:     Rate and Rhythm: Normal rate and regular rhythm.     Heart sounds: S1 normal and S2 normal.     Comments: Distant heart tones Pulmonary:     Effort: No tachypnea or accessory muscle usage.     Breath sounds: No stridor. No wheezing, rhonchi or rales. Decreased breath sounds: throughout all lung fields.    Comments: Diminshed bilaterally  Abdominal:     General: Bowel sounds are normal. There is no distension.     Palpations: Abdomen is soft.     Tenderness: There is no abdominal tenderness.  Musculoskeletal:        General: Deformity (muscle wasting ) present.  Skin:    General: Skin is warm and dry.     Capillary Refill: Capillary refill takes less than 2 seconds.     Findings: No rash.  Neurological:     Mental Status: She is alert and oriented to person, place, and time.  Psychiatric:        Behavior: Behavior normal.  Vitals:   12/13/23 1512  BP: 122/70  Pulse: (!) 102  Temp: 98.3 F (36.8 C)  TempSrc: Oral  SpO2: 95%  Weight: 124 lb 9.6 oz (56.5 kg)  Height: 5\' 2"  (1.575 m)   95% on RA BMI Readings from Last 3 Encounters:  12/13/23 22.79 kg/m  12/12/23 22.86 kg/m  08/23/23 22.64 kg/m   Wt Readings from Last 3 Encounters:  12/13/23 124 lb 9.6 oz (56.5 kg)  12/12/23 125 lb (56.7 kg)  08/23/23 123 lb 12.8 oz (56.2 kg)     CBC    Component Value Date/Time   WBC 4.7 10/12/2022 0603   RBC 2.39 (L) 10/12/2022 0603   HGB 7.5 (L) 10/12/2022 0603   HGB 12.6 07/19/2021 1113   HCT 23.2 (L) 10/12/2022 0603   PLT 165 10/12/2022 0603    PLT 138 (L) 07/19/2021 1113   MCV 97.1 10/12/2022 0603   MCH 31.4 10/12/2022 0603   MCHC 32.3 10/12/2022 0603   RDW 11.9 10/12/2022 0603   LYMPHSABS 0.8 07/19/2021 1113   MONOABS 0.4 07/19/2021 1113   EOSABS 0.0 07/19/2021 1113   BASOSABS 0.0 07/19/2021 1113    Chest Imaging: June 2020 CT chest: Paramediastinal right upper lobe lung radiation no evidence of recurrent tumor.  No evidence of metastatic disease.  Moderate amount of centrilobular emphysema. The patient's images have been independently reviewed by me.    CT chest 10/04/2020: Patient has evidence of significant emphysema within the chest.  She has a small remnant of a loculated pleural effusion with the right hemithorax.  There is mild nodular density within the left lower lobe unchanged from previous CT imaging. The patient's images have been independently reviewed by me.    08/17/2023: IMPRESSION: 1. Post radiation scarring in the right hemithorax. No evidence of recurrent or metastatic disease. 2. Tiny amount of residual loculated pleural fluid in the right hemithorax. 3. Resolved basilar peribronchovascular nodularity and septal thickening, indicative of an infectious/inflammatory etiology. 4. Punctate left renal stone. 5. Aortic atherosclerosis (ICD10-I70.0). Coronary artery calcification. 6. Enlarged pulmonic trunk, indicative of pulmonary arterial hypertension. 7.  Emphysema (ICD10-J43.9).   Pulmonary Functions Testing Results:    Latest Ref Rng & Units 10/27/2021    9:52 AM 12/05/2016    8:42 AM  PFT Results  FVC-Pre L 1.55  1.32   FVC-Predicted Pre % 58  46   FVC-Post L 1.51  1.74   FVC-Predicted Post % 56  61   Pre FEV1/FVC % % 47  45   Post FEV1/FCV % % 48  47   FEV1-Pre L 0.73  0.60   FEV1-Predicted Pre % 36  27   FEV1-Post L 0.73  0.81   DLCO uncorrected ml/min/mmHg 6.88  8.30   DLCO UNC% % 37  37   DLCO corrected ml/min/mmHg 6.88    DLCO COR %Predicted % 37    DLVA Predicted % 53  51   TLC  L 4.36  5.50   TLC % Predicted % 90  113   RV % Predicted % 119  186     FeNO: None   Pathology:  CT Guided BX 2018: Lung cancer: NSCLC, +TTF1, +PDL1  Mediastinoscopy 02/04/2018: Metastatic lung cancer  Echocardiogram: None   Heart Catheterization: None     Assessment & Plan:     ICD-10-CM   1. Chronic obstructive pulmonary disease, unspecified COPD type (HCC)  J44.9     2. Stage 3 severe COPD by GOLD classification (HCC)  J44.9  3. Tobacco abuse  Z72.0     4. History of lung cancer  Z85.118     5. Former smoker  Z87.891     6. Bronchiectasis without complication (HCC)  J47.9     7. Chronic respiratory failure with hypoxia (HCC)  J96.11        Discussion:  This is a 78 year old female severe COPD based on PFTs in 2019 chronic hypoxemic respiratory failure on 2 L nasal cannula with a pulse concentrator that she is able to walk with and carry.  She is a triple therapy inhaler regimen.  Plan: Continue Breztri daily. Continue application through financial aid with [facility]. Continue albuterol as needed O2 supplies with POC. If she has frequent exacerbations in the future could always consider azithromycin Monday Wednesday Friday. RTC in 6 months. She is going to establish care with Dr. Sherene Sires    Current Outpatient Medications:    acetaminophen (TYLENOL) 500 MG tablet, Take 2 tablets (1,000 mg total) by mouth every 6 (six) hours., Disp: 30 tablet, Rfl: 0   albuterol (PROVENTIL) (2.5 MG/3ML) 0.083% nebulizer solution, Take 3 mLs (2.5 mg total) by nebulization every 4 (four) hours as needed for wheezing or shortness of breath., Disp: 75 mL, Rfl: 11   Ascorbic Acid (VITAMIN C) 100 MG tablet, Take 100 mg by mouth 2 (two) times daily with a meal., Disp: , Rfl:    Budeson-Glycopyrrol-Formoterol (BREZTRI AEROSPHERE) 160-9-4.8 MCG/ACT AERO, Inhale 2 puffs into the lungs 2 (two) times daily. Receives from [place], Disp: , Rfl:    Coenzyme Q10 (COQ-10 PO), Take 1 tablet by  mouth daily., Disp: , Rfl:    COVID-19 mRNA vaccine, Pfizer, (COMIRNATY) syringe, Inject into the muscle., Disp: 0.3 mL, Rfl: 0   influenza vaccine adjuvanted (FLUAD) 0.5 ML injection, Inject into the muscle., Disp: 0.5 mL, Rfl: 0   lidocaine-prilocaine (EMLA) cream, Apply to portacath 30 min to 1 hour prior to flush or infusions, Disp: 30 g, Rfl: 1   montelukast (SINGULAIR) 10 MG tablet, Take 1 tablet (10 mg total) by mouth at bedtime., Disp: 90 tablet, Rfl: 5   Multiple Vitamin (MULTIVITAMIN WITH MINERALS) TABS, Take 1 tablet by mouth daily., Disp: , Rfl:    OXYGEN, Inhale 2 L into the lungs continuous. For COPD, Disp: , Rfl:    VENTOLIN HFA 108 (90 Base) MCG/ACT inhaler, Inhale 1-2 puffs into the lungs every 4 (four) hours as needed for wheezing or shortness of breath., Disp: 18 g, Rfl: 11   oxyCODONE (OXY IR/ROXICODONE) 5 MG immediate release tablet, Take 1 tablet (5 mg total) by mouth every 4 (four) hours as needed for severe pain. (Patient not taking: Reported on 12/13/2023), Disp: 42 tablet, Rfl: 0   Josephine Igo, DO [facility] 12/13/2023 3:35 PM

## 2023-12-13 NOTE — Patient Instructions (Addendum)
Thank you for visiting Dr. Tonia Brooms at [facility]. Today we recommend the following:  Continue current regimen   Return in about 6 months (around 06/11/2024) for with APP or Dr. Sherene Sires.    Please do your part to reduce the spread of COVID-19.

## 2024-02-06 ENCOUNTER — Inpatient Hospital Stay: Payer: Medicare Other | Attending: Oncology

## 2024-02-06 DIAGNOSIS — Z85038 Personal history of other malignant neoplasm of large intestine: Secondary | ICD-10-CM | POA: Insufficient documentation

## 2024-02-06 DIAGNOSIS — Z452 Encounter for adjustment and management of vascular access device: Secondary | ICD-10-CM | POA: Insufficient documentation

## 2024-02-06 DIAGNOSIS — Z85118 Personal history of other malignant neoplasm of bronchus and lung: Secondary | ICD-10-CM | POA: Diagnosis not present

## 2024-02-06 DIAGNOSIS — C3411 Malignant neoplasm of upper lobe, right bronchus or lung: Secondary | ICD-10-CM

## 2024-02-06 DIAGNOSIS — Z95828 Presence of other vascular implants and grafts: Secondary | ICD-10-CM

## 2024-02-06 MED ORDER — HEPARIN SOD (PORK) LOCK FLUSH 100 UNIT/ML IV SOLN
500.0000 [IU] | Freq: Once | INTRAVENOUS | Status: AC
  Administered 2024-02-06: 500 [IU]

## 2024-02-06 MED ORDER — SODIUM CHLORIDE 0.9% FLUSH
10.0000 mL | Freq: Once | INTRAVENOUS | Status: AC
  Administered 2024-02-06: 10 mL

## 2024-02-06 NOTE — Patient Instructions (Signed)

## 2024-02-20 ENCOUNTER — Ambulatory Visit (HOSPITAL_BASED_OUTPATIENT_CLINIC_OR_DEPARTMENT_OTHER)
Admission: RE | Admit: 2024-02-20 | Discharge: 2024-02-20 | Disposition: A | Discharge status: Home / Self Care | Payer: Medicare Other | Source: Ambulatory Visit | Attending: Oncology | Admitting: Oncology

## 2024-02-20 DIAGNOSIS — C3411 Malignant neoplasm of upper lobe, right bronchus or lung: Secondary | ICD-10-CM | POA: Insufficient documentation

## 2024-02-20 DIAGNOSIS — I700 Atherosclerosis of aorta: Secondary | ICD-10-CM | POA: Diagnosis not present

## 2024-02-20 DIAGNOSIS — J90 Pleural effusion, not elsewhere classified: Secondary | ICD-10-CM | POA: Diagnosis not present

## 2024-02-20 DIAGNOSIS — C3490 Malignant neoplasm of unspecified part of unspecified bronchus or lung: Secondary | ICD-10-CM | POA: Diagnosis not present

## 2024-02-21 ENCOUNTER — Encounter: Payer: Self-pay | Admitting: Oncology

## 2024-02-21 ENCOUNTER — Inpatient Hospital Stay (HOSPITAL_BASED_OUTPATIENT_CLINIC_OR_DEPARTMENT_OTHER): Payer: Medicare Other | Admitting: Oncology

## 2024-02-21 VITALS — BP 133/71 | HR 100 | Temp 97.7°F | Resp 18 | Ht 62.0 in | Wt 125.8 lb

## 2024-02-21 DIAGNOSIS — C3411 Malignant neoplasm of upper lobe, right bronchus or lung: Secondary | ICD-10-CM

## 2024-02-21 DIAGNOSIS — Z452 Encounter for adjustment and management of vascular access device: Secondary | ICD-10-CM | POA: Diagnosis not present

## 2024-02-21 DIAGNOSIS — Z85038 Personal history of other malignant neoplasm of large intestine: Secondary | ICD-10-CM | POA: Diagnosis not present

## 2024-02-21 DIAGNOSIS — Z85118 Personal history of other malignant neoplasm of bronchus and lung: Secondary | ICD-10-CM | POA: Diagnosis not present

## 2024-02-21 NOTE — Progress Notes (Signed)
 [facility] OFFICE PROGRESS NOTE   Diagnosis: Non-small cell lung cancer, appendix carcinoma  INTERVAL HISTORY:   Melissa Golden returns as scheduled.  She feels well.  No new complaint.  She had an upper respiratory infection 2 weeks ago with a cough.  No fever.  The cough has improved.  She felt as though she had a COPD flare.  Objective:  Vital signs in last 24 hours:  Blood pressure 133/71, pulse 100, temperature 97.7 F (36.5 C), temperature source Temporal, resp. rate 18, height 5\' 2"  (1.575 m), weight 125 lb 12.8 oz (57.1 kg), SpO2 93%.   Lymphatics: No cervical, supraclavicular, axillary, or inguinal nodes Resp: Scattered inspiratory rhonchi, distant breath sounds, no respiratory distress Cardio: Regular rate and rhythm GI: No mass, nontender, no hepatosplenomegaly Vascular: No leg edema   Portacath/PICC-without erythema  Lab Results:  Lab Results  Component Value Date   WBC 4.7 10/12/2022   HGB 7.5 (L) 10/12/2022   HCT 23.2 (L) 10/12/2022   MCV 97.1 10/12/2022   PLT 165 10/12/2022   NEUTROABS 3.5 07/19/2021    CMP  Lab Results  Component Value Date   NA 142 10/12/2022   K 3.5 10/12/2022   CL 99 10/12/2022   CO2 32 10/12/2022   GLUCOSE 93 10/12/2022   BUN 12 10/12/2022   CREATININE 0.64 10/12/2022   CALCIUM 8.3 (L) 10/12/2022   PROT 6.9 07/19/2021   ALBUMIN 3.8 07/19/2021   AST 16 07/19/2021   ALT 10 07/19/2021   ALKPHOS 63 07/19/2021   BILITOT 0.5 07/19/2021   GFRNONAA >60 10/12/2022   GFRAA >60 06/17/2020    Lab Results  Component Value Date   CEA1 2.44 04/19/2020    Lab Results  Component Value Date   INR 0.89 05/31/2018   LABPROT 12.0 05/31/2018    Imaging:  CT CHEST WO CONTRAST Result Date: 02/20/2024 CLINICAL DATA:  Non-small cell lung cancer.  * Tracking Code: BO * EXAM: CT CHEST WITHOUT CONTRAST TECHNIQUE: Multidetector CT imaging of the chest was performed following the standard protocol without IV contrast.  RADIATION DOSE REDUCTION: This exam was performed according to the departmental dose-optimization program which includes automated exposure control, adjustment of the mA and/or kV according to patient size and/or use of iterative reconstruction technique. COMPARISON:  08/17/2023. FINDINGS: Cardiovascular: Right IJ Port-A-Cath terminates in the region of the SVC RA junction. Atherosclerotic calcification of the aorta and coronary arteries. Enlarged pulmonic trunk. Heart size normal. Trace pericardial effusion. Mediastinum/Nodes: No pathologically enlarged mediastinal or axillary lymph nodes. Hilar regions are difficult to definitively evaluate without IV contrast. Esophagus is grossly unremarkable. Lungs/Pleura: Post treatment parenchymal retraction in the posteromedial upper right hemithorax, unchanged. Small partially loculated right pleural effusion, also stable. Centrilobular and paraseptal emphysema. Scarring in the lingula. Increasing peribronchovascular nodular consolidation and ground-glass in the left lower lobe. No left pleural fluid. Airway is otherwise unremarkable. Upper Abdomen: Punctate left renal stone. Visualized portions of the liver, gallbladder, adrenal glands, kidneys, spleen, pancreas, stomach and bowel are otherwise grossly unremarkable. No upper abdominal adenopathy. Musculoskeletal: Osteopenia. Degenerative changes in the spine. Old right rib fracture. T5 and T7 compression fractures, old. No worrisome lytic or sclerotic lesions. IMPRESSION: 1. No evidence of recurrent or metastatic disease. 2. Increasing peribronchovascular nodular consolidation and ground-glass in the left lower lobe, favored to be infectious/inflammatory in etiology. Recommend attention on follow-up oncologic imaging or consider short-term follow-up CT chest without contrast could in 3-4 weeks, after appropriate clinical therapy, as clinically indicated. 3.  Trace pericardial effusion. 4. Chronic small loculated right  pleural effusion. 5. Punctate left renal stone. 6. Aortic atherosclerosis (ICD10-I70.0). Coronary artery calcification. 7. Enlarged pulmonic trunk, indicative of pulmonary arterial hypertension. 8.  Emphysema (ICD10-J43.9). Electronically Signed   By: Leanna Battles M.D.   On: 02/20/2024 15:48    Medications: I have reviewed the patient's current medications.   Assessment/Plan: Adenocarcinoma/goblet cell carcinoid of the appendix CT scan of the abdomen/pelvis on 09/12/2017 - fluid-filled hypervascular appendix with mild reactive enteritis within the ileum.   Status post laparoscopic appendectomy 09/12/2017.  Postoperative course complicated by an ileus requiring TPN.   Final pathology of the appendix-5 cm adenocarcinoma/goblet cell carcinoid.  The tumor extended through the muscularis propria into subserosal soft tissue and mesoappendix and focally to the serosal surface.  Lymphovascular space invasion identified.  Perineural invasion identified.  Associated acute appendicitis with perforation. 10/15/2017 CEA 1. 11/22/2017 chromogranin A-3 Status post laparoscopic right hemicolectomy by Dr[facility] Glennis [facility]ink [facility] on 12/07/2017.  No carcinomatosis or liver metastases were seen.   Final pathology showed metastatic adenocarcinoma/goblet cell carcinoid in 3 of 15 pericolic lymph nodes; maximum size of largest metastasis 0.2 cm; positive for extracapsular extension.  Benign terminal ileum with fibrous serosal adhesions, negative for dysplasia or carcinoma; surgically absent appendix; appendectomy site with postsurgical changes and fibrous serosal adhesions, negative for carcinoma; proximal and distal mucosal margins negative for dysplasia or carcinoma; mesenteric resection margin negative for carcinoma. Cycle 1 adjuvant Xeloda 01/07/2018 Cycle 2 adjuvant Xeloda 01/28/2018 Non-small cell lung cancer PET scan 10/17/2017-3 hypermetabolic right upper lobe pulmonary nodules increased in size and metabolic activity.   No mediastinal nodal metastasis.  4 mm right upper lobe nodule with metabolic activity, SUV 3.6.  A third right upper lobe nodule measures 4 mm and has faint metabolic activity.  No clear evidence of malignancy in the abdomen or pelvis.  Mild activity along the ventral abdominal surgical scar.  Scattered activity in small bowel without focal lesion on CT. CT biopsy right upper lobe nodule 11/02/2017 showed non-small cell carcinoma; malignant cells positive for TTF-1 and negative for p63.  PD-L1 95%. Chest CT 01/21/2018-new mediastinal adenopathy seen in the prevascular and right paratracheal stations.  Largest lymph node is seen in the prevascular space measuring 2.0 cm, previously 6 mm.  No hilar or axillary adenopathy.  Right lung nodules are stable. Biopsy of a right paratracheal lymph node on 02/04/2018-metastatic adenocarcinoma consistent with a lung primary Chest radiation started 03/11/2018 Week 1 Taxol/carboplatin 03/13/2018 Week 2 Taxol/carboplatin 03/20/2018 Week 3 Taxol/carboplatin 03/27/2018 Week  4Taxol/carboplatin 04/03/2018 Week 5 Taxol/carboplatin 04/10/2018 Week 6 Taxol/carboplatin 04/19/2018 Chest radiation completed 04/25/2018 CT chest 05/23/2018-interval decrease in size of mediastinal adenopathy.  Near complete resolution of previously visualized right pleural effusion.  The right lung nodules have decreased in size.  Other nodules are stable. Cycle 1 durvalumab 05/23/2018 Cycle 2 durvalumab 06/06/2018 Cycle 3 durvalumab 06/20/2018 Cycle 4 durvalumab 07/04/2018 Cycle 5 durvalumab 07/19/2018 Cycle 6 durvalumab 08/01/2018 Cycle 7 durvalumab 08/15/2018 CT chest 09/03/2018- collapse/consolidation with bronchiectasis and surrounding groundglass involving primarily the right upper lobe, findings most indicative of recent radiation therapy. Previously measured right upper lobe nodule obscured.  New areas of nodularity in the right lower lobe, likely infectious or inflammatory. Cycle 8 durvalumab  09/04/2018 Cycle 9 durvalumab 09/18/2018 Cycle 10 durvalumab 10/02/2018 Cycle 11 durvalumab 10/16/2018  Cycle 12 durvalumab 11/13/2018 Cycle 13 durvalumab 11/30/2018 Cycle 14 durvalumab 12/11/2018 CT chest 12/16/2018 [facility])- no mediastinal, axillary or hilar adenopathy.  Centrilobular emphysema.  Right apical volume loss with bronchiectasis traversing the area of atelectatic lung.  Elevation of the adjacent minor fissure due to volume loss.  Subpleural left upper lobe opacity, nonspecific, measuring 8.7 mm.  Compression fractures T5 and T7. Cycle 15 durvalumab 12/25/2018 Cycle 16 durvalumab 01/29/2019 Cycle 17 durvalumab 02/12/2019 Cycle 18 durvalumab 02/26/2019 Cycle 19 durvalumab 03/12/2019 Cycle 20 durvalumab 03/26/2019 Cycle 21 durvalumab 04/16/2019 Cycle 22 durvalumab 04/30/2019 CT chest 05/12/2019- right lung radiation fibrosis, no evidence of local tumor recurrence, no evidence of metastatic disease in the chest, stable pulmonary nodules Cycle 23 durvalumab 05/14/2019 CT chest 10/13/2019-stable radiation changes in the right lung, new nodules in the right lower lobe, left lower lobe, and lingula-favored to represent an inflammatory process CT chest 12/30/2019-no findings to suggest recurrent or metastatic disease, subtle groundglass at the site of previous nodularity in the right lung base almost completely resolved CT chest 06/17/2020-6 mm left lower lobe nodule-more conspicuous, stable compression deformities at T5 and T7 CT chest 10/04/2020-nodular density medial left lower lobe unchanged.  Adjacent peribronchovascular groundglass and volume loss have increased slightly and are likely infectious or inflammatory. CT chest 04/28/2021-new interstitial and nodular opacities in the right upper lobe laterally, favoring pneumonia, tumor and lymphangitic spread considered less likely.  Mild increase in loculated right pleural effusion.  Mild increase in atelectasis or nodular scarring peripherally at the  left lung base.  Stable compression fractures at T5 and T7. CT chest 07/19/2021-previously described patchy nodular foci of consolidation in the upper right lung and left lung base have resolved compatible with resolved multilobar pneumonia.  Stable radiation fibrosis upper right lung with no evidence of local tumor recurrence.  No findings suspicious for metastatic disease in the chest. CT chest 02/07/2022-interval development of bilateral irregular pulmonary nodules right greater than left. CT chest 06/01/2022-unchanged posttreatment appearance of the right chest, irregular nodules and groundglass opacities in the bilateral lung bases have improved, emphysema, enlargement the distal descending and proximal abdominal aorta CT chest 11/16/2022-stable right perihilar postradiation change, new clustered irregular nodules at the right lower lobe CT chest 02/08/2023-stable appearance of upper lobe consolidation changes, increase in basilar nodularity, emphysema, osteopenia CT chest 08/17/2023-postradiation scarring in the right hemithorax, no evidence of recurrent disease, tiny loculated right pleural fluid decreased from 02/08/2023 CT chest 02/20/2024-no evidence of recurrent disease, increased peribronchovascular nodular consolidation and groundglass in the left lower lobe favored inflammatory, chronic small loculated right pleural effusion     COPD   4.   VATS drainage of a loculated right pleural effusion 03/01/2018   5.  Port-A-Cath placement 05/31/2018   6.  Severe mid back pain 10/30/2018-CT thoracic spine- T5 compression fracture new since 09/03/2018- appearance consistent with an osteoporotic fracture.  Pain improved.  Increased back pain January 2020.  CT scan 12/16/2018- new compression fractures T5 and T7.  T5 vertebral body somewhat sclerotic in appearance.  MRI thoracic spine 01/08/2019-acute compression fractures involving the T5 and T7 vertebral bodies stable relative to recent CT with up to 50% height  loss at T5 and 25% height loss at T7.  No associated bony retropulsion.  Benign/mechanical features by MRI with no appreciable underlying pathologic lesion.  No other evidence for metastatic disease within the thoracic spine.   7.  Positional vertigo 05/17/2023        Disposition: MelissaSui remains in clinical remission from lung cancer and appendix carcinoma.  The nodular changes at the left lower lung are likely inflammatory, potentially related to a recent upper respiratory infection.  She  will call for increased respiratory symptoms.  She will return for an office visit and chest CT in 6 months.  Thornton Papas, MD  02/21/2024  11:10 AM

## 2024-02-25 DIAGNOSIS — J449 Chronic obstructive pulmonary disease, unspecified: Secondary | ICD-10-CM | POA: Diagnosis not present

## 2024-02-25 DIAGNOSIS — C3411 Malignant neoplasm of upper lobe, right bronchus or lung: Secondary | ICD-10-CM | POA: Diagnosis not present

## 2024-03-10 ENCOUNTER — Other Ambulatory Visit: Payer: Self-pay

## 2024-03-19 ENCOUNTER — Other Ambulatory Visit: Payer: Self-pay

## 2024-03-19 ENCOUNTER — Inpatient Hospital Stay: Attending: Oncology

## 2024-03-19 VITALS — BP 113/55 | HR 89 | Temp 97.8°F | Resp 18 | Wt 122.4 lb

## 2024-03-19 DIAGNOSIS — Z85118 Personal history of other malignant neoplasm of bronchus and lung: Secondary | ICD-10-CM | POA: Diagnosis not present

## 2024-03-19 DIAGNOSIS — Z452 Encounter for adjustment and management of vascular access device: Secondary | ICD-10-CM | POA: Insufficient documentation

## 2024-03-19 DIAGNOSIS — Z85038 Personal history of other malignant neoplasm of large intestine: Secondary | ICD-10-CM | POA: Insufficient documentation

## 2024-03-19 DIAGNOSIS — Z95828 Presence of other vascular implants and grafts: Secondary | ICD-10-CM

## 2024-03-19 MED ORDER — HEPARIN SOD (PORK) LOCK FLUSH 100 UNIT/ML IV SOLN
500.0000 [IU] | Freq: Once | INTRAVENOUS | Status: AC
  Administered 2024-03-19: 500 [IU] via INTRAVENOUS

## 2024-03-19 MED ORDER — SODIUM CHLORIDE 0.9% FLUSH
10.0000 mL | Freq: Once | INTRAVENOUS | Status: AC
  Administered 2024-03-19: 10 mL via INTRAVENOUS

## 2024-03-19 NOTE — Progress Notes (Signed)
 Patient stated on Monday getting out of a chair pulled a muscle when getting up. Pain level is a 7 right now and is taking Tylenol  1000mg s three times a day. Patient stated that the tylenol  is working for the pain. Made aware to use hot/ cold packs to help decrease the inflammation/ pain. If pain persists to reach out to PCP, understood. Made Collaborative nurse Amalia Badder RN aware.

## 2024-03-20 ENCOUNTER — Other Ambulatory Visit: Payer: Self-pay | Admitting: *Deleted

## 2024-03-20 DIAGNOSIS — C3411 Malignant neoplasm of upper lobe, right bronchus or lung: Secondary | ICD-10-CM

## 2024-03-20 MED ORDER — LIDOCAINE-PRILOCAINE 2.5-2.5 % EX CREA: TOPICAL_CREAM | CUTANEOUS | 1 refills | Status: AC

## 2024-03-20 NOTE — Telephone Encounter (Signed)
 Patient requested refill for EMLA  cream when in clinic yesterday.

## 2024-03-26 DIAGNOSIS — J449 Chronic obstructive pulmonary disease, unspecified: Secondary | ICD-10-CM | POA: Diagnosis not present

## 2024-03-26 DIAGNOSIS — C3411 Malignant neoplasm of upper lobe, right bronchus or lung: Secondary | ICD-10-CM | POA: Diagnosis not present

## 2024-04-09 ENCOUNTER — Other Ambulatory Visit: Payer: Self-pay | Admitting: Primary Care

## 2024-04-09 NOTE — Telephone Encounter (Signed)
 Copied from CRM [id number]. Topic: Clinical - Medication Refill >> Apr 09, 2024  3:23 PM Melissa Golden wrote: Medication:  montelukast  (SINGULAIR ) 10 MG table   Has the patient contacted their pharmacy? Yes (Agent: If no, request that the patient contact the pharmacy for the refill. If patient does not wish to contact the pharmacy document the reason why and proceed with request.) (Agent: If yes, when and what did the pharmacy advise?)  This is the patient's preferred pharmacy:  [id number] - [place], [place] - [address] [place] [address] Phone: [phone] Fax: [phone]  Is this the correct pharmacy for this prescription? Yes If no, delete pharmacy and type the correct one.   Has the prescription been filled recently? No  Is the patient out of the medication? No  Has the patient been seen for an appointment in the last year OR does the patient have an upcoming appointment? Yes  Can we respond through MyChart? No  Agent: Please be advised that Rx refills may take up to 3 business days. We ask that you follow-up with your pharmacy.

## 2024-04-10 MED ORDER — MONTELUKAST SODIUM 10 MG PO TABS: 10.0000 mg | ORAL_TABLET | Freq: Every day | ORAL | 5 refills | Status: AC

## 2024-04-25 DIAGNOSIS — J449 Chronic obstructive pulmonary disease, unspecified: Secondary | ICD-10-CM | POA: Diagnosis not present

## 2024-04-25 DIAGNOSIS — C3411 Malignant neoplasm of upper lobe, right bronchus or lung: Secondary | ICD-10-CM | POA: Diagnosis not present

## 2024-05-14 ENCOUNTER — Inpatient Hospital Stay: Attending: Oncology

## 2024-05-14 VITALS — BP 109/60 | HR 83 | Temp 97.6°F | Resp 18

## 2024-05-14 DIAGNOSIS — Z85038 Personal history of other malignant neoplasm of large intestine: Secondary | ICD-10-CM | POA: Insufficient documentation

## 2024-05-14 DIAGNOSIS — C3411 Malignant neoplasm of upper lobe, right bronchus or lung: Secondary | ICD-10-CM

## 2024-05-14 DIAGNOSIS — Z95828 Presence of other vascular implants and grafts: Secondary | ICD-10-CM

## 2024-05-14 DIAGNOSIS — Z85118 Personal history of other malignant neoplasm of bronchus and lung: Secondary | ICD-10-CM | POA: Diagnosis not present

## 2024-05-14 DIAGNOSIS — Z452 Encounter for adjustment and management of vascular access device: Secondary | ICD-10-CM | POA: Insufficient documentation

## 2024-05-14 MED ORDER — SODIUM CHLORIDE 0.9% FLUSH
10.0000 mL | Freq: Once | INTRAVENOUS | Status: AC
  Administered 2024-05-14: 10 mL

## 2024-05-14 MED ORDER — HEPARIN SOD (PORK) LOCK FLUSH 100 UNIT/ML IV SOLN
500.0000 [IU] | Freq: Once | INTRAVENOUS | Status: AC
  Administered 2024-05-14: 500 [IU]

## 2024-05-14 NOTE — Patient Instructions (Signed)

## 2024-05-26 DIAGNOSIS — C3411 Malignant neoplasm of upper lobe, right bronchus or lung: Secondary | ICD-10-CM | POA: Diagnosis not present

## 2024-05-26 DIAGNOSIS — J449 Chronic obstructive pulmonary disease, unspecified: Secondary | ICD-10-CM | POA: Diagnosis not present

## 2024-06-09 DIAGNOSIS — Z20828 Contact with and (suspected) exposure to other viral communicable diseases: Secondary | ICD-10-CM | POA: Diagnosis not present

## 2024-06-09 DIAGNOSIS — Z20822 Contact with and (suspected) exposure to covid-19: Secondary | ICD-10-CM | POA: Diagnosis not present

## 2024-06-09 DIAGNOSIS — J441 Chronic obstructive pulmonary disease with (acute) exacerbation: Secondary | ICD-10-CM | POA: Diagnosis not present

## 2024-06-09 DIAGNOSIS — Z6822 Body mass index (BMI) 22.0-22.9, adult: Secondary | ICD-10-CM | POA: Diagnosis not present

## 2024-06-11 ENCOUNTER — Encounter: Payer: Self-pay | Admitting: Primary Care

## 2024-06-11 ENCOUNTER — Ambulatory Visit (INDEPENDENT_AMBULATORY_CARE_PROVIDER_SITE_OTHER): Payer: Medicare Other | Admitting: Primary Care

## 2024-06-11 VITALS — BP 128/70 | HR 90 | Temp 97.5°F | Ht 62.0 in | Wt 122.8 lb

## 2024-06-11 DIAGNOSIS — Z85118 Personal history of other malignant neoplasm of bronchus and lung: Secondary | ICD-10-CM

## 2024-06-11 DIAGNOSIS — J441 Chronic obstructive pulmonary disease with (acute) exacerbation: Secondary | ICD-10-CM

## 2024-06-11 DIAGNOSIS — J471 Bronchiectasis with (acute) exacerbation: Secondary | ICD-10-CM | POA: Diagnosis not present

## 2024-06-11 MED ORDER — ALBUTEROL SULFATE (2.5 MG/3ML) 0.083% IN NEBU: 2.5000 mg | INHALATION_SOLUTION | Freq: Four times a day (QID) | RESPIRATORY_TRACT | 3 refills | Status: DC | PRN

## 2024-06-11 NOTE — Patient Instructions (Addendum)
  VISIT SUMMARY: Today, you came in for your six-month follow-up appointment. We reviewed your chronic conditions, including COPD, bronchiectasis, and your history of cancer. We also discussed your recent symptoms of coughing and general malaise, and your current treatment plan.  YOUR PLAN: -COPD WITH CHRONIC RESPIRATORY FAILURE: Chronic Obstructive Pulmonary Disease (COPD) is a long-term lung condition that makes it hard to breathe. You will continue using your Breztri inhaler twice daily and supplemental oxygen  at 2 liters. We have renewed your albuterol  nebulizer for use 2-3 times daily as needed for wheezing.  -ACUTE BRONCHITIS: Acute bronchitis is an inflammation of the bronchial tubes, usually caused by an infection. You should continue taking doxycycline for a total of 7 days and use plain Mucinex (guaifenesin) 1-2 tablets twice daily to help loosen congestion. If your symptoms do not improve, we may need to do a chest x-ray. Please contact us  if your symptoms worsen or do not improve.  -BRONCHIECTASIS: Bronchiectasis is a condition where the bronchial tubes in your lungs are permanently damaged, widened, and thickened. We will monitor the frequency of your flare-ups. If you have more than one flare-up a year, we may consider starting you on prophylactic azithromycin  three times a week.  -CANCER: You have a history of cancer but have not required treatment for over a year. Your last CT scan in March showed no evidence of recurrent disease but did show some changes likely due to inflammation. You should continue regular follow-ups with your oncologist, Dr. Arley Hof, with your next appointment scheduled for September.  INSTRUCTIONS: Please continue with your current medications and follow the treatment plans discussed. If your symptoms worsen or do not improve, contact us  immediately. Your next follow-up appointment with your oncologist is scheduled for September.  Follow-up 6 months with  Dr. Meade (new patient COPD- 30 mins)

## 2024-06-11 NOTE — Progress Notes (Signed)
 @Patient  ID: Melissa Golden, female    DOB: 07-05-1946, 78 y.o.   MRN: [id number]  No chief complaint on file.   Referring provider: Lari Elspeth BRAVO, MD  HPI: 78 year old female past medical history of non-small cell lung cancer.  Diagnosed in 2018 with 3 hyper medic right upper lobe pulmonary nodules.  Had a CT-guided biopsy of the upper lobe nodule in December 2018 consistent with non-small cell lung cancer, TTF-1 positive p63 negative, PD-L1 95%.  In February 2019 had new mediastinal adenopathy within the prevascular and right paratracheal lymph node.  Biopsy of the paratracheal lymph node revealed metastatic lung primary.  Patient underwent 6 weeks of Taxol  carboplatinum followed by durvalumab .  Repeat CT chest in June 2020 with evidence of right lung radiation and stable nodules.  Patient also has a history of adenocarcinoma/goblet cell carcinoid of the appendix.  This was in 2018 as well.  Patient underwent laparoscopic right hemicolectomy at [facility].  Patient underwent adjuvant Xeloda .  Patient is followed here in [facility] by Dr. Cloretta her oncologist.  Patient was referred to see me for COPD management.  And complaints of chronic congestion.  OV 08/13/2019: First diagnosed with COPD back in 2018. Currently on pulmicort  + albuterol . She is using her albuterol  3 times per day. She has a travel neb machine but it doesn't work. No fevers, chills. She has a chronic cough. Occassional brings up sputum. She feels like she wheezes all of the time. She lives in [place]. Lives with her husband. Former smoker, quit in 2017, smoked for 50+ years, 1.5-2ppd.  Patient is on 2 L nasal cannula pulse POC.  OV 09/24/2019: Here today for follow-up regarding lung cancer COPD.Patient last seen in September 2020.  Patient doing well today.  Has been using her new inhaler regimen.  Currently using Symbicort  plus Spiriva  Respimat and as needed albuterol .  Her breathing is much improved as where she was before.   She would like to have a nebulizer at home.  She has a older machine but no tubing to connect to it.  We will get her DME supplies for this today.  She is able to keep most of her activities of daily living.  She does get dyspneic with significant exertion.  Denies fever chills night sweats weight loss denies hemoptysis.  OV 10/14/2020: Patient here today for follow-up of COPD.  Please see documented lung cancer and colon cancer history as above.  Overall doing well.  She is on Symbicort  plus Spiriva  at this time.  She felt like she was doing much better on Trelegy.  But due to insurance she had to switch back.  She would like to try again see if insurance would pay for Trelegy if at all possible.  She did better with this.  From a respiratory standpoint still has ongoing cough.  This is not much different from her previous cough but she seems to be annoyed by nonproductive.  Patient denies hemoptysis weight loss.  She had an recent noncontrasted CT of the chest which revealed stability of lung nodule.  This was reviewed with her and her oncologist recently.  OV 04/27/2021: doing well today. Breathing stable. Doing well with trelegy. Occasional wheezing. No chest tightness.  Patient doing well today overall.  Needs refills of all of her inhaler medications.  She has follow-up with oncology this week as well.  Going on vacation this summer.  OV 10/30/2022: From respiratory standpoint she is doing okay.  Has been on triple therapy  with Trelegy.  She is concerned about cost.  Wondering if there is an alternative.  She uses her oxygen  regularly.  Here with her husband today.  OV 04/06/2023: uses trelegy daily.  Uses her oxygen  supplementation.  F[place] out application for [place] in the past.  Has not had a response.  I will have the office follow-up on this.  Still using Trelegy daily at this time.  From respiratory standpoint doing okay.  No recent exacerbations.  She did have a left arm fracture.  She is recovered  from this this past year.  OV 12/13/2023: Here today for follow-up for COPD.  Stable on current respiratory regimen plus albuterol .  Also on oxygen  supplementation.  She is not had any exacerbations recently.  From respiratory standpoint doing well.  She is enjoying time baking.  Plan: Continue Breztri daily. Continue application through financial aid with [facility]. Continue albuterol  as needed O2 supplies with POC. If she has frequent exacerbations in the future could always consider azithromycin  Monday Wednesday Friday. RTC in 6 months. She is going to establish care with Dr. Darlean  06/11/2024- Interim hx  Discussed the use of AI scribe software for clinical note transcription with the patient, who gave verbal consent to proceed.  History of Present Illness   Melissa Golden is a 78 year old female with COPD, bronchiectasis, and chronic respiratory failure who presents for a six-month follow-up. She is on two liters of oxygen  and uses a triple therapy inhaler, Breztri, twice daily. She has been told to avoid prednisone  due to advice from her oncologist. She experiences regular wheezing and has a nebulizer at home but has not used it recently. She is taking montelukast  as directed at bedtime. Recently, she was prescribed doxycycline on Monday due to symptoms of coughing, sneezing, and general malaise. No CXR done at that time. The cough is primarily nonproductive, though occasionally productive at night with yellow mucus. There has been no significant improvement or worsening of symptoms since starting the antibiotic, which is prescribed for a seven-day course. She experiences flare-ups of bronchitis approximately once a year, often following a cold that quickly escalates. Her last antibiotic course was a year ago. No fever, chills, or worsening shortness of breath with the current episode. A CT scan in March showed increased nodular consolidation and ground glass in the left lower lobe, with a chronic  small pleural effusion. She is scheduled to see her oncologist in September.     Chest Imaging: June 2020 CT chest: Paramediastinal right upper lobe lung radiation no evidence of recurrent tumor.  No evidence of metastatic disease.  Moderate amount of centrilobular emphysema. The patient's images have been independently reviewed by me.     CT chest 10/04/2020: Patient has evidence of significant emphysema within the chest.  She has a small remnant of a loculated pleural effusion with the right hemithorax.  There is mild nodular density within the left lower lobe unchanged from previous CT imaging. The patient's images have been independently reviewed by me.     08/17/2023: IMPRESSION: 1. Post radiation scarring in the right hemithorax. No evidence of recurrent or metastatic disease. 2. Tiny amount of residual loculated pleural fluid in the right hemithorax. 3. Resolved basilar peribronchovascular nodularity and septal thickening, indicative of an infectious/inflammatory etiology. 4. Punctate left renal stone. 5. Aortic atherosclerosis (ICD10-I70.0). Coronary artery calcification. 6. Enlarged pulmonic trunk, indicative of pulmonary arterial hypertension. 7.  Emphysema (ICD10-J43.9).  02/20/24 CT chest wo contrast>>  No evidence of recurrent  or metastatic disease. Increasing peribronchovascular nodular consolidation and ground-glass in the left lower lobe, favored to be infectious/inflammatory in etiology. Trace pericardial effusion. Chronic small loculated right pleural effusion.   Pulmonary Functions Testing Results:     Latest Ref Rng & Units 10/27/2021    9:52 AM 12/05/2016    8:42 AM  PFT Results  FVC-Pre L 1.55  1.32   FVC-Predicted Pre % 58  46   FVC-Post L 1.51  1.74   FVC-Predicted Post % 56  61   Pre FEV1/FVC % % 47  45   Post FEV1/FCV % % 48  47   FEV1-Pre L 0.73  0.60   FEV1-Predicted Pre % 36  27   FEV1-Post L 0.73  0.81   DLCO uncorrected ml/min/mmHg 6.88  8.30    DLCO UNC% % 37  37   DLCO corrected ml/min/mmHg 6.88     DLCO COR %Predicted % 37     DLVA Predicted % 53  51   TLC L 4.36  5.50   TLC % Predicted % 90  113   RV % Predicted % 119  186      Pathology:  CT Guided BX 2018: Lung cancer: NSCLC, +TTF1, +PDL1     No Known Allergies  Immunization History  Administered Date(s) Administered   Fluad  Quad(high Dose 65+) 09/27/2021   Fluad  Trivalent(High Dose 65+) 08/23/2023   Influenza-Unspecified 08/27/2017, 08/28/2018, 10/08/2019   Moderna Sars-Covid-2 Vaccination 02/04/2020, 02/14/2020   Pfizer(Comirnaty )Fall Seasonal Vaccine 12 years and older 08/23/2023   Zoster Recombinant(Shingrix) 10/06/2021    Past Medical History:  Diagnosis Date   Asthma    Cancer (HCC) 2017   colon   chemo tx   COPD (chronic obstructive pulmonary disease) (HCC)    wears 3L home O2   Ileus following gastrointestinal surgery (HCC) 09/12/2017   required TPN support   Lung cancer (HCC) dx'd 10/2016   Pleural effusion, right    Shortness of breath    with exertion    Tobacco History: Social History   Tobacco Use  Smoking Status Former   Current packs/day: 0.00   Average packs/day: 1.5 packs/day for 68.0 years (102.0 ttl pk-yrs)   Types: Cigarettes   Start date: 10/1948   Quit date: 10/2016   Years since quitting: 7.6  Smokeless Tobacco Never   Counseling given: Not Answered   Outpatient Medications Prior to Visit  Medication Sig Dispense Refill   acetaminophen  (TYLENOL ) 500 MG tablet Take 2 tablets (1,000 mg total) by mouth every 6 (six) hours. 30 tablet 0   albuterol  (PROVENTIL ) (2.5 MG/3ML) 0.083% nebulizer solution Take 3 mLs (2.5 mg total) by nebulization every 4 (four) hours as needed for wheezing or shortness of breath. 75 mL 11   Ascorbic Acid (VITAMIN C ) 100 MG tablet Take 100 mg by mouth 2 (two) times daily with a meal.     Budeson-Glycopyrrol-Formoterol  (BREZTRI AEROSPHERE) 160-9-4.8 MCG/ACT AERO Inhale 2 puffs into the lungs 2  (two) times daily. Receives from [employer]     Coenzyme Q10 (COQ-10 PO) Take 1 tablet by mouth daily.     lidocaine -prilocaine  (EMLA ) cream Apply to portacath 30 min to 1 hour prior to flush or infusions 30 g 1   montelukast  (SINGULAIR ) 10 MG tablet Take 1 tablet (10 mg total) by mouth at bedtime. 90 tablet 5   Multiple Vitamin (MULTIVITAMIN WITH MINERALS) TABS Take 1 tablet by mouth daily.     OXYGEN  Inhale 2 L into the lungs continuous. For  COPD     VENTOLIN  HFA 108 (90 Base) MCG/ACT inhaler Inhale 1-2 puffs into the lungs every 4 (four) hours as needed for wheezing or shortness of breath. 18 g 11   No facility-administered medications prior to visit.      Review of Systems  Review of Systems  Constitutional: Negative.   HENT:  Positive for congestion.   Respiratory:  Positive for cough and wheezing. Negative for shortness of breath.    Physical Exam  There were no vitals taken for this visit. Physical Exam Constitutional:      General: She is not in acute distress.    Appearance: Normal appearance. She is not ill-appearing.  HENT:     Head: Normocephalic and atraumatic.     Mouth/Throat:     Mouth: Mucous membranes are moist.     Pharynx: Oropharynx is clear.  Cardiovascular:     Rate and Rhythm: Normal rate and regular rhythm.  Pulmonary:     Effort: Pulmonary effort is normal.     Breath sounds: Normal breath sounds. No wheezing or rhonchi.     Comments: Dull rhonchi left base, otherwise clear Musculoskeletal:        General: Normal range of motion.  Skin:    General: Skin is warm and dry.  Neurological:     General: No focal deficit present.     Mental Status: She is alert and oriented to person, place, and time. Mental status is at baseline.  Psychiatric:        Mood and Affect: Mood normal.        Behavior: Behavior normal.        Thought Content: Thought content normal.        Judgment: Judgment normal.      Lab Results:  CBC    Component Value  Date/Time   WBC 4.7 10/12/2022 0603   RBC 2.39 (L) 10/12/2022 0603   HGB 7.5 (L) 10/12/2022 0603   HGB 12.6 07/19/2021 1113   HCT 23.2 (L) 10/12/2022 0603   PLT 165 10/12/2022 0603   PLT 138 (L) 07/19/2021 1113   MCV 97.1 10/12/2022 0603   MCH 31.4 10/12/2022 0603   MCHC 32.3 10/12/2022 0603   RDW 11.9 10/12/2022 0603   LYMPHSABS 0.8 07/19/2021 1113   MONOABS 0.4 07/19/2021 1113   EOSABS 0.0 07/19/2021 1113   BASOSABS 0.0 07/19/2021 1113    BMET    Component Value Date/Time   NA 142 10/12/2022 0603   K 3.5 10/12/2022 0603   CL 99 10/12/2022 0603   CO2 32 10/12/2022 0603   GLUCOSE 93 10/12/2022 0603   BUN 12 10/12/2022 0603   CREATININE 0.64 10/12/2022 0603   CREATININE 0.41 (L) 07/19/2021 1113   CALCIUM 8.3 (L) 10/12/2022 0603   GFRNONAA >60 10/12/2022 0603   GFRNONAA >60 07/19/2021 1113   GFRAA >60 06/17/2020 0930    BNP No results found for: BNP  ProBNP No results found for: PROBNP  Imaging: No results found.   Assessment & Plan:   No problem-specific Assessment & Plan notes found for this encounter.   Assessment and Plan    COPD with chronic respiratory failure Chronic condition managed with Breztri inhaler and supplemental oxygen . Continues to experience chronic wheezing. - Continue Breztri inhaler twice daily - Continue supplemental oxygen  at 2 liters - Renew albuterol  nebulizer for use 2-3 times daily as needed for wheezing  Acute bronchitis Recent onset characterized by nonproductive cough, occasional yellow mucus, and chest congestion. No  fever, chills, or significant worsening of dyspnea. Currently on doxycycline with no significant improvement.  - Continue doxycycline for a total of 7 days - Use plain Mucinex (guaifenesin) 1-2 tablets twice daily to loosen congestion - Consider chest x-ray if no improvement  - Contact provider if symptoms worsen or do not improve  Bronchiectasis Chronic condition with annual flare-ups typically triggered  by colds, leading to bronchitis or pneumonia. Current episode aligns with past exacerbation patterns following a cold. - Monitor frequency of exacerbations; if more than once a year, consider prophylactic azithromycin  Monday, Wednesday, Friday  Lung cancer  Last CT scan in March showed no evidence of recurrent disease but noted increased nodular consolidation and ground glass in the left lower lobe, likely inflammatory, and a chronic small pleural effusion. - Continue regular follow-ups with oncologist Dr. Arley Hof  - Repeat CT chest in September and FU oncology afterwards   Almarie LELON Ferrari, NP 06/11/2024

## 2024-06-18 ENCOUNTER — Other Ambulatory Visit: Payer: Self-pay

## 2024-06-18 MED ORDER — ALBUTEROL SULFATE (2.5 MG/3ML) 0.083% IN NEBU: 2.5000 mg | INHALATION_SOLUTION | Freq: Four times a day (QID) | RESPIRATORY_TRACT | 3 refills | Status: AC | PRN

## 2024-06-26 DIAGNOSIS — C3411 Malignant neoplasm of upper lobe, right bronchus or lung: Secondary | ICD-10-CM | POA: Diagnosis not present

## 2024-06-26 DIAGNOSIS — J449 Chronic obstructive pulmonary disease, unspecified: Secondary | ICD-10-CM | POA: Diagnosis not present

## 2024-07-09 ENCOUNTER — Inpatient Hospital Stay: Attending: Oncology

## 2024-07-09 DIAGNOSIS — Z85118 Personal history of other malignant neoplasm of bronchus and lung: Secondary | ICD-10-CM | POA: Insufficient documentation

## 2024-07-09 DIAGNOSIS — Z85038 Personal history of other malignant neoplasm of large intestine: Secondary | ICD-10-CM | POA: Insufficient documentation

## 2024-07-09 DIAGNOSIS — Z452 Encounter for adjustment and management of vascular access device: Secondary | ICD-10-CM | POA: Diagnosis not present

## 2024-07-09 NOTE — Patient Instructions (Signed)

## 2024-07-25 DIAGNOSIS — C3411 Malignant neoplasm of upper lobe, right bronchus or lung: Secondary | ICD-10-CM | POA: Diagnosis not present

## 2024-07-25 DIAGNOSIS — J449 Chronic obstructive pulmonary disease, unspecified: Secondary | ICD-10-CM | POA: Diagnosis not present

## 2024-08-04 ENCOUNTER — Inpatient Hospital Stay

## 2024-08-04 ENCOUNTER — Inpatient Hospital Stay: Attending: Oncology | Admitting: Oncology

## 2024-08-04 ENCOUNTER — Ambulatory Visit (HOSPITAL_BASED_OUTPATIENT_CLINIC_OR_DEPARTMENT_OTHER)
Admission: RE | Admit: 2024-08-04 | Discharge: 2024-08-04 | Disposition: A | Discharge status: Home / Self Care | Source: Ambulatory Visit | Attending: Oncology | Admitting: Oncology

## 2024-08-04 VITALS — BP 135/66 | HR 100 | Temp 97.8°F | Resp 18 | Ht 62.0 in | Wt 120.2 lb

## 2024-08-04 DIAGNOSIS — C3490 Malignant neoplasm of unspecified part of unspecified bronchus or lung: Secondary | ICD-10-CM | POA: Diagnosis not present

## 2024-08-04 DIAGNOSIS — Z23 Encounter for immunization: Secondary | ICD-10-CM

## 2024-08-04 DIAGNOSIS — Z452 Encounter for adjustment and management of vascular access device: Secondary | ICD-10-CM | POA: Diagnosis not present

## 2024-08-04 DIAGNOSIS — I700 Atherosclerosis of aorta: Secondary | ICD-10-CM | POA: Diagnosis not present

## 2024-08-04 DIAGNOSIS — C3411 Malignant neoplasm of upper lobe, right bronchus or lung: Secondary | ICD-10-CM | POA: Diagnosis not present

## 2024-08-04 DIAGNOSIS — Z85038 Personal history of other malignant neoplasm of large intestine: Secondary | ICD-10-CM | POA: Diagnosis not present

## 2024-08-04 DIAGNOSIS — J984 Other disorders of lung: Secondary | ICD-10-CM | POA: Diagnosis not present

## 2024-08-04 DIAGNOSIS — Z85118 Personal history of other malignant neoplasm of bronchus and lung: Secondary | ICD-10-CM | POA: Diagnosis not present

## 2024-08-04 MED ORDER — HEPARIN SOD (PORK) LOCK FLUSH 100 UNIT/ML IV SOLN
500.0000 [IU] | Freq: Once | INTRAVENOUS | Status: AC
Start: 2024-08-04 — End: 2024-08-04
  Administered 2024-08-04: 500 [IU] via INTRAVENOUS

## 2024-08-04 MED ORDER — INFLUENZA VAC SPLIT HIGH-DOSE 0.5 ML IM SUSY
0.5000 mL | PREFILLED_SYRINGE | Freq: Once | INTRAMUSCULAR | Status: AC
  Administered 2024-08-04: 0.5 mL via INTRAMUSCULAR
  Filled 2024-08-04: qty 0.5

## 2024-08-04 NOTE — Progress Notes (Signed)
 Melissa Golden   Diagnosis: Non-small cell lung cancer, appendix cancer  INTERVAL HISTORY:   Melissa Golden returns as scheduled.  She feels well.  No new complaint.  She has chronic exertional dyspnea.  She continues home oxygen  therapy.  Objective:  Vital signs in last 24 hours:  Blood pressure 135/66, pulse 100, temperature 97.8 F (36.6 C), temperature source Temporal, resp. rate 18, height 5' 2 (1.575 m), weight 120 lb 3.2 oz (54.5 kg), SpO2 96%.    Lymphatics: No cervical, supraclavicular, axillary, or inguinal nodes Resp: Distant breath sounds with scattered inspiratory/expiratory rhonchi, no respiratory distress Cardio: Regular rate and rhythm GI: No hepatosplenomegaly, no apparent ascites, nontender, no mass Vascular: No leg edema  Lab Results:  Lab Results  Component Value Date   WBC 4.7 10/12/2022   HGB 7.5 (L) 10/12/2022   HCT 23.2 (L) 10/12/2022   MCV 97.1 10/12/2022   PLT 165 10/12/2022   NEUTROABS 3.5 07/19/2021    CMP  Lab Results  Component Value Date   NA 142 10/12/2022   K 3.5 10/12/2022   CL 99 10/12/2022   CO2 32 10/12/2022   GLUCOSE 93 10/12/2022   BUN 12 10/12/2022   CREATININE 0.64 10/12/2022   CALCIUM 8.3 (L) 10/12/2022   PROT 6.9 07/19/2021   ALBUMIN  3.8 07/19/2021   AST 16 07/19/2021   ALT 10 07/19/2021   ALKPHOS 63 07/19/2021   BILITOT 0.5 07/19/2021   GFRNONAA >60 10/12/2022   GFRAA >60 06/17/2020    Lab Results  Component Value Date   CEA1 2.44 04/19/2020     Medications: I have reviewed the patient's current medications.   Assessment/Plan: Adenocarcinoma/goblet cell carcinoid of the appendix CT scan of the abdomen/pelvis on 09/12/2017 - fluid-filled hypervascular appendix with mild reactive enteritis within the ileum.   Status post laparoscopic appendectomy 09/12/2017.  Postoperative course complicated by an ileus requiring TPN.   Final pathology of the appendix-5 cm  adenocarcinoma/goblet cell carcinoid.  The tumor extended through the muscularis propria into subserosal soft tissue and mesoappendix and focally to the serosal surface.  Lymphovascular space invasion identified.  Perineural invasion identified.  Associated acute appendicitis with perforation. 10/15/2017 CEA 1. 11/22/2017 chromogranin A-3 Status post laparoscopic right hemicolectomy by Dr. Ricka at [facility] on 12/07/2017.  No carcinomatosis or liver metastases were seen.   Final pathology showed metastatic adenocarcinoma/goblet cell carcinoid in 3 of 15 pericolic lymph nodes; maximum size of largest metastasis 0.2 cm; positive for extracapsular extension.  Benign terminal ileum with fibrous serosal adhesions, negative for dysplasia or carcinoma; surgically absent appendix; appendectomy site with postsurgical changes and fibrous serosal adhesions, negative for carcinoma; proximal and distal mucosal margins negative for dysplasia or carcinoma; mesenteric resection margin negative for carcinoma. Cycle 1 adjuvant Xeloda  01/07/2018 Cycle 2 adjuvant Xeloda  01/28/2018 Non-small cell lung cancer PET scan 10/17/2017-3 hypermetabolic right upper lobe pulmonary nodules increased in size and metabolic activity.  No mediastinal nodal metastasis.  4 mm right upper lobe nodule with metabolic activity, SUV 3.6.  A third right upper lobe nodule measures 4 mm and has faint metabolic activity.  No clear evidence of malignancy in the abdomen or pelvis.  Mild activity along the ventral abdominal surgical scar.  Scattered activity in small bowel without focal lesion on CT. CT biopsy right upper lobe nodule 11/02/2017 showed non-small cell carcinoma; malignant cells positive for TTF-1 and negative for p63.  PD-L1 95%. Chest CT 01/21/2018-new mediastinal adenopathy seen in the prevascular and right paratracheal  stations.  Largest lymph node is seen in the prevascular space measuring 2.0 cm, previously 6 mm.  No hilar or axillary  adenopathy.  Right lung nodules are stable. Biopsy of a right paratracheal lymph node on 02/04/2018-metastatic adenocarcinoma consistent with a lung primary Chest radiation started 03/11/2018 Week 1 Taxol /carboplatin  03/13/2018 Week 2 Taxol /carboplatin  03/20/2018 Week 3 Taxol /carboplatin  03/27/2018 Week  4Taxol/carboplatin  04/03/2018 Week 5 Taxol /carboplatin  04/10/2018 Week 6 Taxol /carboplatin  04/19/2018 Chest radiation completed 04/25/2018 CT chest 05/23/2018-interval decrease in size of mediastinal adenopathy.  Near complete resolution of previously visualized right pleural effusion.  The right lung nodules have decreased in size.  Other nodules are stable. Cycle 1 durvalumab  05/23/2018 Cycle 2 durvalumab  06/06/2018 Cycle 3 durvalumab  06/20/2018 Cycle 4 durvalumab  07/04/2018 Cycle 5 durvalumab  07/19/2018 Cycle 6 durvalumab  08/01/2018 Cycle 7 durvalumab  08/15/2018 CT chest 09/03/2018- collapse/consolidation with bronchiectasis and surrounding groundglass involving primarily the right upper lobe, findings most indicative of recent radiation therapy. Previously measured right upper lobe nodule obscured.  New areas of nodularity in the right lower lobe, likely infectious or inflammatory. Cycle 8 durvalumab  09/04/2018 Cycle 9 durvalumab  09/18/2018 Cycle 10 durvalumab  10/02/2018 Cycle 11 durvalumab  10/16/2018  Cycle 12 durvalumab  11/13/2018 Cycle 13 durvalumab  11/30/2018 Cycle 14 durvalumab  12/11/2018 CT chest 12/16/2018 [facility])- no mediastinal, axillary or hilar adenopathy.  Centrilobular emphysema.  Right apical volume loss with bronchiectasis traversing the area of atelectatic lung.  Elevation of the adjacent minor fissure due to volume loss.  Subpleural left upper lobe opacity, nonspecific, measuring 8.7 mm.  Compression fractures T5 and T7. Cycle 15 durvalumab  12/25/2018 Cycle 16 durvalumab  01/29/2019 Cycle 17 durvalumab  02/12/2019 Cycle 18 durvalumab  02/26/2019 Cycle 19 durvalumab  03/12/2019 Cycle 20  durvalumab  03/26/2019 Cycle 21 durvalumab  04/16/2019 Cycle 22 durvalumab  04/30/2019 CT chest 05/12/2019- right lung radiation fibrosis, no evidence of local tumor recurrence, no evidence of metastatic disease in the chest, stable pulmonary nodules Cycle 23 durvalumab  05/14/2019 CT chest 10/13/2019-stable radiation changes in the right lung, new nodules in the right lower lobe, left lower lobe, and lingula-favored to represent an inflammatory process CT chest 12/30/2019-no findings to suggest recurrent or metastatic disease, subtle groundglass at the site of previous nodularity in the right lung base almost completely resolved CT chest 06/17/2020-6 mm left lower lobe nodule-more conspicuous, stable compression deformities at T5 and T7 CT chest 10/04/2020-nodular density medial left lower lobe unchanged.  Adjacent peribronchovascular groundglass and volume loss have increased slightly and are likely infectious or inflammatory. CT chest 04/28/2021-new interstitial and nodular opacities in the right upper lobe laterally, favoring pneumonia, tumor and lymphangitic spread considered less likely.  Mild increase in loculated right pleural effusion.  Mild increase in atelectasis or nodular scarring peripherally at the left lung base.  Stable compression fractures at T5 and T7. CT chest 07/19/2021-previously described patchy nodular foci of consolidation in the upper right lung and left lung base have resolved compatible with resolved multilobar pneumonia.  Stable radiation fibrosis upper right lung with no evidence of local tumor recurrence.  No findings suspicious for metastatic disease in the chest. CT chest 02/07/2022-interval development of bilateral irregular pulmonary nodules right greater than left. CT chest 06/01/2022-unchanged posttreatment appearance of the right chest, irregular nodules and groundglass opacities in the bilateral lung bases have improved, emphysema, enlargement the distal descending and proximal  abdominal aorta CT chest 11/16/2022-stable right perihilar postradiation change, new clustered irregular nodules at the right lower lobe CT chest 02/08/2023-stable appearance of upper lobe consolidation changes, increase in basilar nodularity, emphysema, osteopenia CT chest 08/17/2023-postradiation scarring in the  right hemithorax, no evidence of recurrent disease, tiny loculated right pleural fluid decreased from 02/08/2023 CT chest 02/20/2024-no evidence of recurrent disease, increased peribronchovascular nodular consolidation and groundglass in the left lower lobe favored inflammatory, chronic small loculated right pleural effusion CT chest 08/04/2024-no change by my review     COPD   4.   VATS drainage of a loculated right pleural effusion 03/01/2018   5.  Port-A-Cath placement 05/31/2018   6.  Severe mid back pain 10/30/2018-CT thoracic spine- T5 compression fracture new since 09/03/2018- appearance consistent with an osteoporotic fracture.  Pain improved.  Increased back pain January 2020.  CT scan 12/16/2018- new compression fractures T5 and T7.  T5 vertebral body somewhat sclerotic in appearance.  MRI thoracic spine 01/08/2019-acute compression fractures involving the T5 and T7 vertebral bodies stable relative to recent CT with up to 50% height loss at T5 and 25% height loss at T7.  No associated bony retropulsion.  Benign/mechanical features by MRI with no appreciable underlying pathologic lesion.  No other evidence for metastatic disease within the thoracic spine.   7.  Positional vertigo 05/17/2023       Disposition: Melissa Golden remains in clinical remission from appendix carcinoma and lung cancer.  I reviewed the surveillance chest CT images with her.  I see no evidence of recurrent disease or a new lung primary.  We will follow-up on the final report.  She would like to keep the Port-A-Cath in place.  She will return for a Port-A-Cath flush every 8 weeks and an office visit in 6 months.  We will  plan for a 1 year surveillance chest CT.  She will remain up-to-date on influenza and pneumonia vaccines.  She received an influenza vaccine today.  Arley Hof, MD  08/04/2024  3:33 PM

## 2024-08-04 NOTE — Patient Instructions (Signed)

## 2024-08-12 ENCOUNTER — Ambulatory Visit: Payer: Self-pay | Admitting: Oncology

## 2024-08-15 ENCOUNTER — Encounter: Payer: Self-pay | Admitting: Oncology

## 2024-08-15 NOTE — Telephone Encounter (Signed)
 Notified Melissa Golden that CT shows  no recurrent cancer.

## 2024-08-15 NOTE — Telephone Encounter (Signed)
-----   Message from Arley Hof sent at 08/12/2024  4:29 PM EDT ----- Please call patient, the final CT reading is available and reveals no evidence of recurrent cancer, follow-up as scheduled  ----- Message ----- From: Interface, Rad Results In Sent: 08/12/2024   5:24 AM EDT To: Arley KATHEE Hof, MD

## 2024-08-21 ENCOUNTER — Inpatient Hospital Stay: Admitting: Oncology

## 2024-08-21 ENCOUNTER — Inpatient Hospital Stay

## 2024-08-21 ENCOUNTER — Other Ambulatory Visit (HOSPITAL_BASED_OUTPATIENT_CLINIC_OR_DEPARTMENT_OTHER)

## 2024-08-26 DIAGNOSIS — C3411 Malignant neoplasm of upper lobe, right bronchus or lung: Secondary | ICD-10-CM | POA: Diagnosis not present

## 2024-08-26 DIAGNOSIS — J449 Chronic obstructive pulmonary disease, unspecified: Secondary | ICD-10-CM | POA: Diagnosis not present

## 2024-09-17 ENCOUNTER — Telehealth: Payer: Self-pay | Admitting: Oncology

## 2024-09-26 DIAGNOSIS — C3411 Malignant neoplasm of upper lobe, right bronchus or lung: Secondary | ICD-10-CM | POA: Diagnosis not present

## 2024-09-26 DIAGNOSIS — J449 Chronic obstructive pulmonary disease, unspecified: Secondary | ICD-10-CM | POA: Diagnosis not present

## 2024-09-30 ENCOUNTER — Inpatient Hospital Stay: Attending: Oncology

## 2024-09-30 DIAGNOSIS — Z85118 Personal history of other malignant neoplasm of bronchus and lung: Secondary | ICD-10-CM | POA: Insufficient documentation

## 2024-09-30 DIAGNOSIS — Z85038 Personal history of other malignant neoplasm of large intestine: Secondary | ICD-10-CM | POA: Diagnosis not present

## 2024-09-30 DIAGNOSIS — Z452 Encounter for adjustment and management of vascular access device: Secondary | ICD-10-CM | POA: Diagnosis not present

## 2024-09-30 NOTE — Patient Instructions (Signed)

## 2024-10-24 DIAGNOSIS — J449 Chronic obstructive pulmonary disease, unspecified: Secondary | ICD-10-CM | POA: Diagnosis not present

## 2024-10-24 DIAGNOSIS — C3411 Malignant neoplasm of upper lobe, right bronchus or lung: Secondary | ICD-10-CM | POA: Diagnosis not present

## 2024-10-27 DIAGNOSIS — Z23 Encounter for immunization: Secondary | ICD-10-CM | POA: Diagnosis not present

## 2024-11-25 ENCOUNTER — Inpatient Hospital Stay: Attending: Oncology

## 2024-11-25 DIAGNOSIS — Z85118 Personal history of other malignant neoplasm of bronchus and lung: Secondary | ICD-10-CM | POA: Insufficient documentation

## 2024-11-25 DIAGNOSIS — Z452 Encounter for adjustment and management of vascular access device: Secondary | ICD-10-CM | POA: Diagnosis present

## 2024-11-25 NOTE — Patient Instructions (Signed)

## 2025-01-20 ENCOUNTER — Ambulatory Visit: Admitting: Oncology

## 2025-01-20 ENCOUNTER — Encounter
